# Patient Record
Sex: Female | Born: 1970 | Race: White | Hispanic: No | Marital: Married | Smoking: Current every day smoker
Health system: Southern US, Community
[De-identification: ages and names within clinical notes are randomized; demographics above are authoritative.]

## PROBLEM LIST (undated history)

## (undated) DIAGNOSIS — Z8719 Personal history of other diseases of the digestive system: Secondary | ICD-10-CM

## (undated) DIAGNOSIS — K219 Gastro-esophageal reflux disease without esophagitis: Secondary | ICD-10-CM

## (undated) DIAGNOSIS — D649 Anemia, unspecified: Secondary | ICD-10-CM

## (undated) DIAGNOSIS — F1110 Opioid abuse, uncomplicated: Secondary | ICD-10-CM

## (undated) DIAGNOSIS — I700 Atherosclerosis of aorta: Secondary | ICD-10-CM

## (undated) DIAGNOSIS — D696 Thrombocytopenia, unspecified: Secondary | ICD-10-CM

## (undated) DIAGNOSIS — N200 Calculus of kidney: Secondary | ICD-10-CM

## (undated) DIAGNOSIS — N179 Acute kidney failure, unspecified: Secondary | ICD-10-CM

## (undated) DIAGNOSIS — J449 Chronic obstructive pulmonary disease, unspecified: Secondary | ICD-10-CM

## (undated) DIAGNOSIS — K760 Fatty (change of) liver, not elsewhere classified: Secondary | ICD-10-CM

## (undated) HISTORY — PX: BACK SURGERY: SHX140

## (undated) HISTORY — DX: Thrombocytopenia, unspecified: D69.6

## (undated) HISTORY — DX: Atherosclerosis of aorta: I70.0

## (undated) HISTORY — DX: Chronic obstructive pulmonary disease, unspecified: J44.9

## (undated) HISTORY — DX: Calculus of kidney: N20.0

## (undated) HISTORY — DX: Personal history of other diseases of the digestive system: Z87.19

## (undated) HISTORY — DX: Acute kidney failure, unspecified: N17.9

## (undated) HISTORY — PX: OTHER SURGICAL HISTORY: SHX169

## (undated) HISTORY — DX: Fatty (change of) liver, not elsewhere classified: K76.0

## (undated) HISTORY — DX: Anemia, unspecified: D64.9

## (undated) HISTORY — DX: Gastro-esophageal reflux disease without esophagitis: K21.9

## (undated) HISTORY — DX: Opioid abuse, uncomplicated: F11.10

---

## 2004-08-07 ENCOUNTER — Emergency Department: Payer: Self-pay | Admitting: Emergency Medicine

## 2005-03-25 ENCOUNTER — Emergency Department: Payer: Self-pay | Admitting: Internal Medicine

## 2005-05-18 ENCOUNTER — Other Ambulatory Visit: Payer: Self-pay

## 2005-05-18 ENCOUNTER — Inpatient Hospital Stay: Payer: Self-pay | Admitting: Psychiatry

## 2005-07-02 ENCOUNTER — Encounter: Admission: RE | Admit: 2005-07-02 | Discharge: 2005-07-02 | Payer: Self-pay | Admitting: Neurosurgery

## 2005-07-16 ENCOUNTER — Encounter: Admission: RE | Admit: 2005-07-16 | Discharge: 2005-07-16 | Payer: Self-pay | Admitting: Neurosurgery

## 2005-08-13 ENCOUNTER — Inpatient Hospital Stay (HOSPITAL_COMMUNITY): Admission: RE | Admit: 2005-08-13 | Discharge: 2005-08-14 | Payer: Self-pay | Admitting: Neurosurgery

## 2006-04-11 ENCOUNTER — Emergency Department: Payer: Self-pay | Admitting: Emergency Medicine

## 2007-02-05 ENCOUNTER — Emergency Department: Payer: Self-pay | Admitting: Emergency Medicine

## 2007-02-08 ENCOUNTER — Ambulatory Visit: Payer: Self-pay | Admitting: Orthopaedic Surgery

## 2008-11-28 ENCOUNTER — Emergency Department: Payer: Self-pay | Admitting: Emergency Medicine

## 2009-09-07 ENCOUNTER — Emergency Department: Payer: Self-pay | Admitting: Emergency Medicine

## 2009-11-12 ENCOUNTER — Emergency Department: Payer: Self-pay | Admitting: Emergency Medicine

## 2010-02-17 ENCOUNTER — Encounter: Payer: Self-pay | Admitting: Neurosurgery

## 2011-10-07 ENCOUNTER — Emergency Department: Payer: Self-pay | Admitting: Emergency Medicine

## 2012-01-15 DIAGNOSIS — Z720 Tobacco use: Secondary | ICD-10-CM | POA: Diagnosis present

## 2012-01-15 DIAGNOSIS — J45909 Unspecified asthma, uncomplicated: Secondary | ICD-10-CM

## 2012-01-15 HISTORY — DX: Unspecified asthma, uncomplicated: J45.909

## 2012-03-02 ENCOUNTER — Emergency Department: Payer: Self-pay | Admitting: Emergency Medicine

## 2012-03-03 LAB — URINALYSIS, COMPLETE
Bilirubin,UR: NEGATIVE
Glucose,UR: NEGATIVE mg/dL (ref 0–75)
RBC,UR: <1 /HPF (ref 0–5)
Specific Gravity: 1.004 (ref 1.003–1.030)
WBC UR: NONE SEEN /HPF (ref 0–5)

## 2012-04-05 ENCOUNTER — Emergency Department: Payer: Self-pay | Admitting: Emergency Medicine

## 2013-01-26 DIAGNOSIS — M5412 Radiculopathy, cervical region: Secondary | ICD-10-CM | POA: Insufficient documentation

## 2013-02-16 ENCOUNTER — Inpatient Hospital Stay: Payer: Self-pay | Admitting: Surgery

## 2013-02-16 LAB — URINALYSIS, COMPLETE
BACTERIA: NONE SEEN
BILIRUBIN, UR: NEGATIVE
Glucose,UR: NEGATIVE mg/dL (ref 0–75)
LEUKOCYTE ESTERASE: NEGATIVE
Nitrite: NEGATIVE
PH: 5 (ref 4.5–8.0)
RBC,UR: 7 /HPF (ref 0–5)
Specific Gravity: 1.023 (ref 1.003–1.030)
Squamous Epithelial: 1

## 2013-02-16 LAB — COMPREHENSIVE METABOLIC PANEL
Albumin: 4 g/dL (ref 3.4–5.0)
Alkaline Phosphatase: 98 U/L
Anion Gap: 7 (ref 7–16)
BILIRUBIN TOTAL: 0.5 mg/dL (ref 0.2–1.0)
BUN: 10 mg/dL (ref 7–18)
Calcium, Total: 9.6 mg/dL (ref 8.5–10.1)
Chloride: 107 mmol/L (ref 98–107)
Co2: 23 mmol/L (ref 21–32)
Creatinine: 0.69 mg/dL (ref 0.60–1.30)
EGFR (Non-African Amer.): >60
Glucose: 115 mg/dL — ABNORMAL HIGH (ref 65–99)
Osmolality: 274 (ref 275–301)
Potassium: 3.9 mmol/L (ref 3.5–5.1)
SGOT(AST): 12 U/L — ABNORMAL LOW (ref 15–37)
SGPT (ALT): 24 U/L (ref 12–78)
Sodium: 137 mmol/L (ref 136–145)
TOTAL PROTEIN: 8.5 g/dL — AB (ref 6.4–8.2)

## 2013-02-16 LAB — CBC WITH DIFFERENTIAL/PLATELET
BASOS ABS: 0.2 10*3/uL — AB (ref 0.0–0.1)
Basophil %: 1.3 %
Eosinophil #: 0 10*3/uL (ref 0.0–0.7)
Eosinophil %: 0.1 %
HCT: 48.2 % — ABNORMAL HIGH (ref 35.0–47.0)
HGB: 16 g/dL (ref 12.0–16.0)
LYMPHS ABS: 1.6 10*3/uL (ref 1.0–3.6)
LYMPHS PCT: 8.8 %
MCH: 32.5 pg (ref 26.0–34.0)
MCHC: 33.2 g/dL (ref 32.0–36.0)
MCV: 98 fL (ref 80–100)
MONOS PCT: 4.1 %
Monocyte #: 0.7 x10 3/mm (ref 0.2–0.9)
NEUTROS ABS: 15.4 10*3/uL — AB (ref 1.4–6.5)
NEUTROS PCT: 85.7 %
PLATELETS: 314 10*3/uL (ref 150–440)
RBC: 4.92 10*6/uL (ref 3.80–5.20)
RDW: 13.5 % (ref 11.5–14.5)
WBC: 18 10*3/uL — AB (ref 3.6–11.0)

## 2013-02-16 LAB — LIPASE, BLOOD: LIPASE: 159 U/L (ref 73–393)

## 2013-02-17 LAB — CBC WITH DIFFERENTIAL/PLATELET
Basophil #: 0 10*3/uL (ref 0.0–0.1)
Basophil %: 0.3 %
EOS ABS: 0.2 10*3/uL (ref 0.0–0.7)
EOS PCT: 1.5 %
HCT: 41.1 % (ref 35.0–47.0)
HGB: 13.8 g/dL (ref 12.0–16.0)
LYMPHS ABS: 2 10*3/uL (ref 1.0–3.6)
Lymphocyte %: 17 %
MCH: 32.8 pg (ref 26.0–34.0)
MCHC: 33.6 g/dL (ref 32.0–36.0)
MCV: 98 fL (ref 80–100)
MONO ABS: 0.8 x10 3/mm (ref 0.2–0.9)
MONOS PCT: 6.7 %
NEUTROS ABS: 8.6 10*3/uL — AB (ref 1.4–6.5)
NEUTROS PCT: 74.5 %
Platelet: 269 10*3/uL (ref 150–440)
RBC: 4.21 10*6/uL (ref 3.80–5.20)
RDW: 13.5 % (ref 11.5–14.5)
WBC: 11.5 10*3/uL — AB (ref 3.6–11.0)

## 2013-02-17 LAB — BASIC METABOLIC PANEL
Anion Gap: 6 — ABNORMAL LOW (ref 7–16)
BUN: 10 mg/dL (ref 7–18)
CREATININE: 0.76 mg/dL (ref 0.60–1.30)
Calcium, Total: 8.6 mg/dL (ref 8.5–10.1)
Chloride: 105 mmol/L (ref 98–107)
Co2: 25 mmol/L (ref 21–32)
EGFR (African American): >60
EGFR (Non-African Amer.): >60
GLUCOSE: 130 mg/dL — AB (ref 65–99)
OSMOLALITY: 273 (ref 275–301)
Potassium: 3.4 mmol/L — ABNORMAL LOW (ref 3.5–5.1)
SODIUM: 136 mmol/L (ref 136–145)

## 2013-02-18 LAB — CBC WITH DIFFERENTIAL/PLATELET
BASOS PCT: 2.3 %
Basophil #: 0.1 10*3/uL (ref 0.0–0.1)
EOS ABS: 0.2 10*3/uL (ref 0.0–0.7)
EOS PCT: 3.7 %
HCT: 37.7 % (ref 35.0–47.0)
HGB: 12.4 g/dL (ref 12.0–16.0)
LYMPHS ABS: 2.2 10*3/uL (ref 1.0–3.6)
LYMPHS PCT: 34.6 %
MCH: 32.6 pg (ref 26.0–34.0)
MCHC: 32.8 g/dL (ref 32.0–36.0)
MCV: 99 fL (ref 80–100)
MONO ABS: 0.5 x10 3/mm (ref 0.2–0.9)
Monocyte %: 8.2 %
Neutrophil #: 3.2 10*3/uL (ref 1.4–6.5)
Neutrophil %: 51.2 %
Platelet: 223 10*3/uL (ref 150–440)
RBC: 3.8 10*6/uL (ref 3.80–5.20)
RDW: 13.5 % (ref 11.5–14.5)
WBC: 6.3 10*3/uL (ref 3.6–11.0)

## 2013-02-18 LAB — BASIC METABOLIC PANEL
ANION GAP: 2 — AB (ref 7–16)
BUN: 5 mg/dL — ABNORMAL LOW (ref 7–18)
CREATININE: 0.79 mg/dL (ref 0.60–1.30)
Calcium, Total: 8 mg/dL — ABNORMAL LOW (ref 8.5–10.1)
Chloride: 109 mmol/L — ABNORMAL HIGH (ref 98–107)
Co2: 27 mmol/L (ref 21–32)
GLUCOSE: 102 mg/dL — AB (ref 65–99)
OSMOLALITY: 273 (ref 275–301)
Potassium: 4.1 mmol/L (ref 3.5–5.1)
SODIUM: 138 mmol/L (ref 136–145)

## 2014-02-14 DIAGNOSIS — G8929 Other chronic pain: Secondary | ICD-10-CM | POA: Insufficient documentation

## 2014-05-20 NOTE — H&P (Signed)
PATIENT NAME:  Janet Hensley, LOWENTHAL MR#:  [id number] DATE OF BIRTH:  April 07, 1970  DATE OF ADMISSION:  02/16/2013  CHIEF COMPLAINT: Nausea, vomiting and diarrhea.   HISTORY OF PRESENT ILLNESS: This is a patient who started having some diffuse periumbilical abdominal pain yesterday and then this morning started having nausea, vomiting and diarrhea. She has vomited every 15 minutes. She has had multiple stools. Her abdominal pain is improved now that she has had the CAT scan and has had some bowel movements, but she still has abdominal pain and is still having nausea. A work-up in the Emergency Room suggested partial small bowel obstruction. I was asked to see the patient by Dr. Darnelle Catalan.   The patient has never had an episode like this before. Denies melena or hematochezia or back pain.   PAST MEDICAL HISTORY: Chronic back pain.   PAST SURGICAL HISTORY: Back surgery, C-section, and left wrist surgery.   ALLERGIES: None.   MEDICATIONS: Opiate analgesics and Lyrica.   FAMILY HISTORY: Noncontributory.   SOCIAL HISTORY: The patient smokes tobacco. Drinks alcohol. 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 She is accompanied by a significant other, who is sleeping at the time of the exam.   PHYSICAL EXAMINATION:   VITAL SIGNS: Stable. She is afebrile.  HEENT: No scleral icterus.  NECK: No palpable neck nodes.  CHEST: Clear to auscultation.  CARDIAC: Regular rate and rhythm.  ABDOMEN: Soft, nondistended, minimally tympanitic, and essentially nontender. No guarding. No rebound. No percussion tenderness.  EXTREMITIES: Without edema.  NEUROLOGIC: Grossly intact.  INTEGUMENTARY: No jaundice.   REVIEW OF SYSTEMS: Performed 10-system and negative with the exception of that mentioned in the HPI.   LABORATORY AND RADIOLOGICAL DATA: Demonstrate 1+ ketones 3+ blood in her urine.   CT scan is personally reviewed suggestive of partial small bowel obstruction.   Electrolytes are within normal limits.   White blood cell  count is 18,000, hemoglobin and hematocrit of 16 and 48 and a platelet count of 314.   ASSESSMENT AND PLAN: This is a patient with abdominal pain which is slightly improved, that started yesterday, but then she started having nausea, vomiting and diarrhea without melena, hematochezia or hematemesis. Her work-up has suggested bowel obstruction, partial in nature, but she does have a leukocytosis. I have recommended rehydrating her, admitting to the hospital, placing a nasogastric tube, and re-examination and serial x-rays. The possible pathophysiology for this was discussed, as was the plan, and she understood and agreed to proceed. Her significant other continued to sleep through the discussion.   ____________________________ Adah Salvage Excell Seltzer, MD rec:jcm D: 02/16/2013 20:25:17 ET T: 02/16/2013 21:04:47 ET JOB#: [id number]  cc: Adah Salvage. Excell Seltzer, MD, <Dictator> Lattie Haw MD ELECTRONICALLY SIGNED 02/16/2013 22:52

## 2014-05-20 NOTE — H&P (Signed)
   Subjective/Chief Complaint N/V/D   History of Present Illness 24 hrs abd pain followed by N/V/D no f/c no prior episode no melena   Past History PMH chronic back pain PSH csection, back, wrist   Past Med/Surgical Hx:  depression:   Left arm surgery:   Back surgery:   C-Section:   ALLERGIES:  No Known Allergies:   HOME MEDICATIONS: Medication Instructions Status  Lyrica 75 mg oral capsule 1 cap(s) orally 3 times a day Active  Percocet 10/325 325 mg-10 mg oral tablet 1 tab(s) orally 4 times a day Active  ProAir HFA CFC free 90 mcg/inh inhalation aerosol 2 puff(s) inhaled 4 times a day, As Needed - for Shortness of Breath Active   Family and Social History:  Family History Non-Contributory   Social History positive  tobacco, positive ETOH, 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   + Tobacco Current (within 1 year)   Place of Living Home   Review of Systems:  Fever/Chills No   Cough No   Abdominal Pain Yes   Diarrhea Yes   Constipation No   Nausea/Vomiting Yes   SOB/DOE No   Chest Pain No   Dysuria No   Tolerating Diet No  Nauseated  Vomiting   Medications/Allergies Reviewed Medications/Allergies reviewed   Physical Exam:  GEN no acute distress   HEENT pink conjunctivae   RESP normal resp effort  clear BS   CARD regular rate   ABD denies tenderness  soft   EXTR negative edema   SKIN normal to palpation   PSYCH alert, A+O to time, place, person, good insight    Assessment/Admission Diagnosis abd pain, improved followed by N/V/D rec admit rehysdrate serial exams/films, NG agrees with plan   Electronic Signatures: Lattie Haw (MD)  (Signed 21-Jan-15 20:20)  Authored: CHIEF COMPLAINT and HISTORY, PAST MEDICAL/SURGIAL HISTORY, ALLERGIES, HOME MEDICATIONS, FAMILY AND SOCIAL HISTORY, REVIEW OF SYSTEMS, PHYSICAL EXAM, ASSESSMENT AND PLAN   Last Updated: 21-Jan-15 20:20 by Lattie Haw (MD)

## 2014-07-10 DIAGNOSIS — M5010 Cervical disc disorder with radiculopathy, unspecified cervical region: Secondary | ICD-10-CM | POA: Insufficient documentation

## 2015-03-15 DIAGNOSIS — G4700 Insomnia, unspecified: Secondary | ICD-10-CM | POA: Insufficient documentation

## 2015-06-27 DIAGNOSIS — J4520 Mild intermittent asthma, uncomplicated: Secondary | ICD-10-CM | POA: Insufficient documentation

## 2015-09-19 DIAGNOSIS — F331 Major depressive disorder, recurrent, moderate: Secondary | ICD-10-CM | POA: Insufficient documentation

## 2015-09-19 DIAGNOSIS — F411 Generalized anxiety disorder: Secondary | ICD-10-CM | POA: Insufficient documentation

## 2016-01-30 ENCOUNTER — Ambulatory Visit
Admission: RE | Admit: 2016-01-30 | Discharge: 2016-01-30 | Disposition: A | Discharge status: Home / Self Care | Payer: Disability Insurance | Source: Ambulatory Visit | Attending: Thoracic Surgery | Admitting: Thoracic Surgery

## 2016-01-30 ENCOUNTER — Other Ambulatory Visit: Payer: Self-pay | Admitting: Thoracic Surgery

## 2016-01-30 DIAGNOSIS — M47812 Spondylosis without myelopathy or radiculopathy, cervical region: Secondary | ICD-10-CM | POA: Diagnosis not present

## 2016-01-30 DIAGNOSIS — M549 Dorsalgia, unspecified: Secondary | ICD-10-CM | POA: Diagnosis not present

## 2016-01-30 DIAGNOSIS — I2510 Atherosclerotic heart disease of native coronary artery without angina pectoris: Secondary | ICD-10-CM | POA: Diagnosis not present

## 2016-01-30 DIAGNOSIS — M5382 Other specified dorsopathies, cervical region: Secondary | ICD-10-CM | POA: Insufficient documentation

## 2017-10-08 ENCOUNTER — Other Ambulatory Visit: Payer: Self-pay | Admitting: Family Medicine

## 2017-10-08 DIAGNOSIS — Z1231 Encounter for screening mammogram for malignant neoplasm of breast: Secondary | ICD-10-CM

## 2017-12-21 DIAGNOSIS — G500 Trigeminal neuralgia: Secondary | ICD-10-CM | POA: Insufficient documentation

## 2018-01-27 HISTORY — PX: BRAIN SURGERY: SHX531

## 2018-08-25 DIAGNOSIS — Z9889 Other specified postprocedural states: Secondary | ICD-10-CM | POA: Insufficient documentation

## 2018-10-14 DIAGNOSIS — L209 Atopic dermatitis, unspecified: Secondary | ICD-10-CM | POA: Insufficient documentation

## 2020-04-16 ENCOUNTER — Other Ambulatory Visit: Payer: Self-pay | Admitting: Physician Assistant

## 2020-04-16 DIAGNOSIS — Z1231 Encounter for screening mammogram for malignant neoplasm of breast: Secondary | ICD-10-CM

## 2020-05-11 ENCOUNTER — Ambulatory Visit
Admission: RE | Admit: 2020-05-11 | Discharge: 2020-05-11 | Disposition: A | Discharge status: Home / Self Care | Payer: Managed Care, Other (non HMO) | Source: Ambulatory Visit | Attending: Physician Assistant | Admitting: Physician Assistant

## 2020-05-11 ENCOUNTER — Other Ambulatory Visit: Payer: Self-pay

## 2020-05-11 DIAGNOSIS — Z1231 Encounter for screening mammogram for malignant neoplasm of breast: Secondary | ICD-10-CM | POA: Diagnosis present

## 2020-07-10 ENCOUNTER — Other Ambulatory Visit: Payer: Self-pay | Admitting: Neurology

## 2020-07-10 DIAGNOSIS — M4802 Spinal stenosis, cervical region: Secondary | ICD-10-CM

## 2020-07-17 ENCOUNTER — Ambulatory Visit: Admission: RE | Admit: 2020-07-17 | Payer: Managed Care, Other (non HMO) | Source: Ambulatory Visit

## 2020-07-26 ENCOUNTER — Ambulatory Visit: Payer: Managed Care, Other (non HMO)

## 2021-06-14 DIAGNOSIS — F411 Generalized anxiety disorder: Secondary | ICD-10-CM | POA: Diagnosis not present

## 2021-06-14 DIAGNOSIS — M4802 Spinal stenosis, cervical region: Secondary | ICD-10-CM | POA: Diagnosis not present

## 2021-06-14 DIAGNOSIS — R69 Illness, unspecified: Secondary | ICD-10-CM | POA: Diagnosis not present

## 2021-06-14 DIAGNOSIS — Z1389 Encounter for screening for other disorder: Secondary | ICD-10-CM | POA: Diagnosis not present

## 2021-06-14 DIAGNOSIS — E669 Obesity, unspecified: Secondary | ICD-10-CM | POA: Diagnosis not present

## 2021-06-14 DIAGNOSIS — G894 Chronic pain syndrome: Secondary | ICD-10-CM | POA: Diagnosis not present

## 2021-08-30 DIAGNOSIS — L209 Atopic dermatitis, unspecified: Secondary | ICD-10-CM | POA: Diagnosis not present

## 2021-10-10 DIAGNOSIS — G894 Chronic pain syndrome: Secondary | ICD-10-CM | POA: Diagnosis not present

## 2021-10-10 DIAGNOSIS — L209 Atopic dermatitis, unspecified: Secondary | ICD-10-CM | POA: Diagnosis not present

## 2021-10-10 DIAGNOSIS — Z20822 Contact with and (suspected) exposure to covid-19: Secondary | ICD-10-CM | POA: Diagnosis not present

## 2021-10-10 DIAGNOSIS — J069 Acute upper respiratory infection, unspecified: Secondary | ICD-10-CM | POA: Diagnosis not present

## 2021-10-16 DIAGNOSIS — M5481 Occipital neuralgia: Secondary | ICD-10-CM | POA: Diagnosis not present

## 2021-10-16 DIAGNOSIS — R42 Dizziness and giddiness: Secondary | ICD-10-CM | POA: Diagnosis not present

## 2021-10-16 DIAGNOSIS — G8929 Other chronic pain: Secondary | ICD-10-CM | POA: Diagnosis not present

## 2021-10-16 DIAGNOSIS — M542 Cervicalgia: Secondary | ICD-10-CM | POA: Diagnosis not present

## 2021-10-16 DIAGNOSIS — R519 Headache, unspecified: Secondary | ICD-10-CM | POA: Diagnosis not present

## 2021-10-16 DIAGNOSIS — M4802 Spinal stenosis, cervical region: Secondary | ICD-10-CM | POA: Diagnosis not present

## 2021-11-14 DIAGNOSIS — G4700 Insomnia, unspecified: Secondary | ICD-10-CM | POA: Diagnosis not present

## 2021-11-14 DIAGNOSIS — G894 Chronic pain syndrome: Secondary | ICD-10-CM | POA: Diagnosis not present

## 2021-12-22 ENCOUNTER — Inpatient Hospital Stay
Admission: EM | Admit: 2021-12-22 | Discharge: 2021-12-28 | DRG: 871 | Disposition: A | Discharge status: Other Acute Inpatient Hospital | Payer: 59 | Attending: Pulmonary Disease | Admitting: Pulmonary Disease

## 2021-12-22 ENCOUNTER — Encounter: Payer: Self-pay | Admitting: Internal Medicine

## 2021-12-22 ENCOUNTER — Emergency Department: Payer: 59

## 2021-12-22 ENCOUNTER — Other Ambulatory Visit: Payer: Self-pay

## 2021-12-22 DIAGNOSIS — J151 Pneumonia due to Pseudomonas: Secondary | ICD-10-CM | POA: Diagnosis not present

## 2021-12-22 DIAGNOSIS — R000 Tachycardia, unspecified: Secondary | ICD-10-CM | POA: Diagnosis not present

## 2021-12-22 DIAGNOSIS — Z4682 Encounter for fitting and adjustment of non-vascular catheter: Secondary | ICD-10-CM | POA: Diagnosis not present

## 2021-12-22 DIAGNOSIS — M549 Dorsalgia, unspecified: Secondary | ICD-10-CM | POA: Diagnosis present

## 2021-12-22 DIAGNOSIS — R9431 Abnormal electrocardiogram [ECG] [EKG]: Secondary | ICD-10-CM | POA: Diagnosis not present

## 2021-12-22 DIAGNOSIS — J441 Chronic obstructive pulmonary disease with (acute) exacerbation: Secondary | ICD-10-CM | POA: Diagnosis present

## 2021-12-22 DIAGNOSIS — T380X5A Adverse effect of glucocorticoids and synthetic analogues, initial encounter: Secondary | ICD-10-CM | POA: Diagnosis not present

## 2021-12-22 DIAGNOSIS — R579 Shock, unspecified: Secondary | ICD-10-CM | POA: Diagnosis not present

## 2021-12-22 DIAGNOSIS — E871 Hypo-osmolality and hyponatremia: Secondary | ICD-10-CM | POA: Diagnosis present

## 2021-12-22 DIAGNOSIS — G500 Trigeminal neuralgia: Secondary | ICD-10-CM | POA: Diagnosis present

## 2021-12-22 DIAGNOSIS — M542 Cervicalgia: Secondary | ICD-10-CM | POA: Diagnosis not present

## 2021-12-22 DIAGNOSIS — J9690 Respiratory failure, unspecified, unspecified whether with hypoxia or hypercapnia: Secondary | ICD-10-CM | POA: Diagnosis not present

## 2021-12-22 DIAGNOSIS — I2510 Atherosclerotic heart disease of native coronary artery without angina pectoris: Secondary | ICD-10-CM

## 2021-12-22 DIAGNOSIS — N179 Acute kidney failure, unspecified: Secondary | ICD-10-CM

## 2021-12-22 DIAGNOSIS — D649 Anemia, unspecified: Secondary | ICD-10-CM | POA: Diagnosis not present

## 2021-12-22 DIAGNOSIS — E878 Other disorders of electrolyte and fluid balance, not elsewhere classified: Secondary | ICD-10-CM | POA: Diagnosis present

## 2021-12-22 DIAGNOSIS — E8809 Other disorders of plasma-protein metabolism, not elsewhere classified: Secondary | ICD-10-CM | POA: Diagnosis not present

## 2021-12-22 DIAGNOSIS — E870 Hyperosmolality and hypernatremia: Secondary | ICD-10-CM | POA: Diagnosis not present

## 2021-12-22 DIAGNOSIS — Z720 Tobacco use: Secondary | ICD-10-CM | POA: Diagnosis present

## 2021-12-22 DIAGNOSIS — R531 Weakness: Secondary | ICD-10-CM | POA: Diagnosis not present

## 2021-12-22 DIAGNOSIS — E874 Mixed disorder of acid-base balance: Secondary | ICD-10-CM | POA: Diagnosis present

## 2021-12-22 DIAGNOSIS — R590 Localized enlarged lymph nodes: Secondary | ICD-10-CM | POA: Diagnosis present

## 2021-12-22 DIAGNOSIS — R4182 Altered mental status, unspecified: Secondary | ICD-10-CM | POA: Diagnosis not present

## 2021-12-22 DIAGNOSIS — R6520 Severe sepsis without septic shock: Secondary | ICD-10-CM | POA: Diagnosis not present

## 2021-12-22 DIAGNOSIS — J440 Chronic obstructive pulmonary disease with acute lower respiratory infection: Secondary | ICD-10-CM | POA: Diagnosis not present

## 2021-12-22 DIAGNOSIS — I25111 Atherosclerotic heart disease of native coronary artery with angina pectoris with documented spasm: Secondary | ICD-10-CM | POA: Diagnosis not present

## 2021-12-22 DIAGNOSIS — J9602 Acute respiratory failure with hypercapnia: Secondary | ICD-10-CM | POA: Diagnosis present

## 2021-12-22 DIAGNOSIS — E43 Unspecified severe protein-calorie malnutrition: Secondary | ICD-10-CM | POA: Diagnosis present

## 2021-12-22 DIAGNOSIS — R599 Enlarged lymph nodes, unspecified: Secondary | ICD-10-CM

## 2021-12-22 DIAGNOSIS — F419 Anxiety disorder, unspecified: Secondary | ICD-10-CM | POA: Diagnosis present

## 2021-12-22 DIAGNOSIS — G8929 Other chronic pain: Secondary | ICD-10-CM | POA: Diagnosis present

## 2021-12-22 DIAGNOSIS — R0603 Acute respiratory distress: Principal | ICD-10-CM | POA: Diagnosis present

## 2021-12-22 DIAGNOSIS — A4152 Sepsis due to Pseudomonas: Principal | ICD-10-CM | POA: Diagnosis present

## 2021-12-22 DIAGNOSIS — Z79899 Other long term (current) drug therapy: Secondary | ICD-10-CM

## 2021-12-22 DIAGNOSIS — Z1152 Encounter for screening for COVID-19: Secondary | ICD-10-CM | POA: Diagnosis not present

## 2021-12-22 DIAGNOSIS — D6959 Other secondary thrombocytopenia: Secondary | ICD-10-CM | POA: Diagnosis present

## 2021-12-22 DIAGNOSIS — R0600 Dyspnea, unspecified: Secondary | ICD-10-CM | POA: Diagnosis not present

## 2021-12-22 DIAGNOSIS — J80 Acute respiratory distress syndrome: Secondary | ICD-10-CM | POA: Diagnosis not present

## 2021-12-22 DIAGNOSIS — F17210 Nicotine dependence, cigarettes, uncomplicated: Secondary | ICD-10-CM | POA: Diagnosis present

## 2021-12-22 DIAGNOSIS — F32A Depression, unspecified: Secondary | ICD-10-CM | POA: Diagnosis not present

## 2021-12-22 DIAGNOSIS — E875 Hyperkalemia: Secondary | ICD-10-CM | POA: Diagnosis not present

## 2021-12-22 DIAGNOSIS — A4159 Other Gram-negative sepsis: Secondary | ICD-10-CM | POA: Diagnosis not present

## 2021-12-22 DIAGNOSIS — I213 ST elevation (STEMI) myocardial infarction of unspecified site: Secondary | ICD-10-CM

## 2021-12-22 DIAGNOSIS — E876 Hypokalemia: Secondary | ICD-10-CM | POA: Diagnosis present

## 2021-12-22 DIAGNOSIS — R0902 Hypoxemia: Secondary | ICD-10-CM | POA: Diagnosis not present

## 2021-12-22 DIAGNOSIS — R601 Generalized edema: Secondary | ICD-10-CM | POA: Diagnosis present

## 2021-12-22 DIAGNOSIS — R0602 Shortness of breath: Secondary | ICD-10-CM | POA: Diagnosis not present

## 2021-12-22 DIAGNOSIS — Z6834 Body mass index (BMI) 34.0-34.9, adult: Secondary | ICD-10-CM

## 2021-12-22 DIAGNOSIS — Z9911 Dependence on respirator [ventilator] status: Secondary | ICD-10-CM | POA: Diagnosis not present

## 2021-12-22 DIAGNOSIS — J9601 Acute respiratory failure with hypoxia: Secondary | ICD-10-CM | POA: Diagnosis present

## 2021-12-22 DIAGNOSIS — I428 Other cardiomyopathies: Secondary | ICD-10-CM | POA: Diagnosis not present

## 2021-12-22 DIAGNOSIS — A481 Legionnaires' disease: Secondary | ICD-10-CM | POA: Diagnosis not present

## 2021-12-22 DIAGNOSIS — J189 Pneumonia, unspecified organism: Secondary | ICD-10-CM | POA: Diagnosis not present

## 2021-12-22 DIAGNOSIS — R918 Other nonspecific abnormal finding of lung field: Secondary | ICD-10-CM | POA: Diagnosis not present

## 2021-12-22 DIAGNOSIS — R739 Hyperglycemia, unspecified: Secondary | ICD-10-CM | POA: Diagnosis not present

## 2021-12-22 LAB — COMPREHENSIVE METABOLIC PANEL
ALT: 40 U/L (ref 0–44)
AST: 168 U/L — ABNORMAL HIGH (ref 15–41)
Albumin: <1.5 g/dL — ABNORMAL LOW (ref 3.5–5.0)
Alkaline Phosphatase: 56 U/L (ref 38–126)
Anion gap: 14 (ref 5–15)
BUN: 33 mg/dL — ABNORMAL HIGH (ref 6–20)
CO2: 22 mmol/L (ref 22–32)
Calcium: 7.3 mg/dL — ABNORMAL LOW (ref 8.9–10.3)
Chloride: 98 mmol/L (ref 98–111)
Creatinine, Ser: 0.83 mg/dL (ref 0.44–1.00)
GFR, Estimated: >60 mL/min (ref >60)
Glucose, Bld: 110 mg/dL — ABNORMAL HIGH (ref 70–99)
Potassium: 2.7 mmol/L — CL (ref 3.5–5.1)
Sodium: 134 mmol/L — ABNORMAL LOW (ref 135–145)
Total Bilirubin: 1.3 mg/dL — ABNORMAL HIGH (ref 0.3–1.2)
Total Protein: 5.3 g/dL — ABNORMAL LOW (ref 6.5–8.1)

## 2021-12-22 LAB — BLOOD GAS, VENOUS
Acid-base deficit: 1.5 mmol/L (ref 0.0–2.0)
Bicarbonate: 24.3 mmol/L (ref 20.0–28.0)
O2 Saturation: 71.9 %
Patient temperature: 37
pCO2, Ven: 44 mmHg (ref 44–60)
pH, Ven: 7.35 (ref 7.25–7.43)
pO2, Ven: 45 mmHg (ref 32–45)

## 2021-12-22 LAB — TROPONIN I (HIGH SENSITIVITY)
Troponin I (High Sensitivity): 5 ng/L (ref <18)
Troponin I (High Sensitivity): 5 ng/L (ref <18)

## 2021-12-22 LAB — CBC WITH DIFFERENTIAL/PLATELET
Abs Immature Granulocytes: 0.07 10*3/uL (ref 0.00–0.07)
Basophils Absolute: 0 10*3/uL (ref 0.0–0.1)
Basophils Relative: 1 %
Eosinophils Absolute: 0 10*3/uL (ref 0.0–0.5)
Eosinophils Relative: 0 %
HCT: 32.7 % — ABNORMAL LOW (ref 36.0–46.0)
Hemoglobin: 11.3 g/dL — ABNORMAL LOW (ref 12.0–15.0)
Immature Granulocytes: 1 %
Lymphocytes Relative: 9 %
Lymphs Abs: 0.6 10*3/uL — ABNORMAL LOW (ref 0.7–4.0)
MCH: 31.7 pg (ref 26.0–34.0)
MCHC: 34.6 g/dL (ref 30.0–36.0)
MCV: 91.6 fL (ref 80.0–100.0)
Monocytes Absolute: 0.1 10*3/uL (ref 0.1–1.0)
Monocytes Relative: 2 %
Neutro Abs: 5.2 10*3/uL (ref 1.7–7.7)
Neutrophils Relative %: 87 %
Platelets: 217 10*3/uL (ref 150–400)
RBC: 3.57 MIL/uL — ABNORMAL LOW (ref 3.87–5.11)
RDW: 13.8 % (ref 11.5–15.5)
Smear Review: NORMAL
WBC: 6 10*3/uL (ref 4.0–10.5)
nRBC: 0 % (ref 0.0–0.2)

## 2021-12-22 LAB — PROCALCITONIN: Procalcitonin: 15.18 ng/mL

## 2021-12-22 LAB — GLUCOSE, CAPILLARY: Glucose-Capillary: 93 mg/dL (ref 70–99)

## 2021-12-22 LAB — BRAIN NATRIURETIC PEPTIDE: B Natriuretic Peptide: 15.2 pg/mL (ref 0.0–100.0)

## 2021-12-22 LAB — LACTIC ACID, PLASMA
Lactic Acid, Venous: 5.1 mmol/L (ref 0.5–1.9)
Lactic Acid, Venous: 5.6 mmol/L (ref 0.5–1.9)

## 2021-12-22 LAB — TSH: TSH: 0.744 u[IU]/mL (ref 0.350–4.500)

## 2021-12-22 LAB — CK: Total CK: 26 U/L — ABNORMAL LOW (ref 38–234)

## 2021-12-22 LAB — ETHANOL: Alcohol, Ethyl (B): <10 mg/dL (ref <10)

## 2021-12-22 LAB — MAGNESIUM: Magnesium: 2.4 mg/dL (ref 1.7–2.4)

## 2021-12-22 MED ORDER — METHYLPREDNISOLONE SODIUM SUCC 40 MG IJ SOLR: 40.0000 mg | Freq: Once | INTRAMUSCULAR | Status: DC

## 2021-12-22 MED ORDER — ACETAMINOPHEN 325 MG PO TABS: 650.0000 mg | ORAL_TABLET | Freq: Four times a day (QID) | ORAL | Status: DC | PRN

## 2021-12-22 MED ORDER — HEPARIN SODIUM (PORCINE) 5000 UNIT/ML IJ SOLN
5000.0000 [IU] | Freq: Three times a day (TID) | INTRAMUSCULAR | Status: DC
  Administered 2021-12-22 – 2021-12-23 (×2): 5000 [IU] via SUBCUTANEOUS
  Filled 2021-12-22 (×2): qty 1

## 2021-12-22 MED ORDER — SODIUM CHLORIDE 0.9 % IV SOLN
2.0000 g | Freq: Two times a day (BID) | INTRAVENOUS | Status: DC
  Administered 2021-12-23: 2 g via INTRAVENOUS
  Filled 2021-12-22: qty 2
  Filled 2021-12-22: qty 20

## 2021-12-22 MED ORDER — NICOTINE 21 MG/24HR TD PT24
21.0000 mg | MEDICATED_PATCH | Freq: Every day | TRANSDERMAL | Status: DC
  Administered 2021-12-24 – 2021-12-26 (×3): 21 mg via TRANSDERMAL
  Filled 2021-12-22 (×6): qty 1

## 2021-12-22 MED ORDER — HYDROCODONE-ACETAMINOPHEN 5-325 MG PO TABS: 1.0000 | ORAL_TABLET | ORAL | Status: DC | PRN

## 2021-12-22 MED ORDER — BUDESONIDE 0.25 MG/2ML IN SUSP
0.2500 mg | Freq: Two times a day (BID) | RESPIRATORY_TRACT | Status: DC
  Administered 2021-12-23 – 2021-12-28 (×11): 0.25 mg via RESPIRATORY_TRACT
  Filled 2021-12-22 (×11): qty 2

## 2021-12-22 MED ORDER — LACTATED RINGERS IV BOLUS (SEPSIS)
1000.0000 mL | Freq: Once | INTRAVENOUS | Status: AC
  Administered 2021-12-22: 1000 mL via INTRAVENOUS

## 2021-12-22 MED ORDER — LACTATED RINGERS IV SOLN: INTRAVENOUS | Status: AC

## 2021-12-22 MED ORDER — SODIUM CHLORIDE 0.9 % IV SOLN
2.0000 g | INTRAVENOUS | Status: DC
  Administered 2021-12-22: 2 g via INTRAVENOUS
  Filled 2021-12-22: qty 20

## 2021-12-22 MED ORDER — IPRATROPIUM-ALBUTEROL 0.5-2.5 (3) MG/3ML IN SOLN
3.0000 mL | RESPIRATORY_TRACT | Status: DC | PRN
  Administered 2021-12-22: 3 mL via RESPIRATORY_TRACT
  Filled 2021-12-22: qty 3

## 2021-12-22 MED ORDER — METHYLPREDNISOLONE SODIUM SUCC 125 MG IJ SOLR
125.0000 mg | Freq: Once | INTRAMUSCULAR | Status: AC
  Administered 2021-12-22: 125 mg via INTRAVENOUS
  Filled 2021-12-22: qty 2

## 2021-12-22 MED ORDER — LACTATED RINGERS IV SOLN: INTRAVENOUS | Status: DC

## 2021-12-22 MED ORDER — SODIUM CHLORIDE 0.9 % IV BOLUS
1000.0000 mL | Freq: Once | INTRAVENOUS | Status: AC
  Administered 2021-12-22: 1000 mL via INTRAVENOUS

## 2021-12-22 MED ORDER — SODIUM CHLORIDE 0.9% FLUSH
3.0000 mL | Freq: Two times a day (BID) | INTRAVENOUS | Status: DC
  Administered 2021-12-23 – 2021-12-27 (×9): 3 mL via INTRAVENOUS

## 2021-12-22 MED ORDER — DOXYCYCLINE HYCLATE 100 MG PO TABS
100.0000 mg | ORAL_TABLET | Freq: Once | ORAL | Status: AC
  Administered 2021-12-22: 100 mg via ORAL
  Filled 2021-12-22: qty 1

## 2021-12-22 MED ORDER — LACTATED RINGERS IV BOLUS: 500.0000 mL | Freq: Once | INTRAVENOUS | Status: DC

## 2021-12-22 MED ORDER — POTASSIUM CHLORIDE 10 MEQ/100ML IV SOLN
10.0000 meq | Freq: Once | INTRAVENOUS | Status: AC
  Administered 2021-12-22: 10 meq via INTRAVENOUS

## 2021-12-22 MED ORDER — IOHEXOL 350 MG/ML SOLN
80.0000 mL | Freq: Once | INTRAVENOUS | Status: AC | PRN
  Administered 2021-12-22: 80 mL via INTRAVENOUS

## 2021-12-22 MED ORDER — SODIUM CHLORIDE 0.9 % IV SOLN
100.0000 mg | Freq: Two times a day (BID) | INTRAVENOUS | Status: DC
  Filled 2021-12-22: qty 100

## 2021-12-22 MED ORDER — VANCOMYCIN HCL 2000 MG/400ML IV SOLN
2000.0000 mg | Freq: Once | INTRAVENOUS | Status: AC
  Administered 2021-12-23: 2000 mg via INTRAVENOUS
  Filled 2021-12-22: qty 400

## 2021-12-22 MED ORDER — SODIUM CHLORIDE 0.9 % IV SOLN: 1.0000 g | Freq: Two times a day (BID) | INTRAVENOUS | Status: DC

## 2021-12-22 MED ORDER — IPRATROPIUM-ALBUTEROL 0.5-2.5 (3) MG/3ML IN SOLN
3.0000 mL | RESPIRATORY_TRACT | Status: DC
  Administered 2021-12-23 – 2021-12-28 (×34): 3 mL via RESPIRATORY_TRACT
  Filled 2021-12-22 (×34): qty 3

## 2021-12-22 MED ORDER — POTASSIUM CHLORIDE CRYS ER 20 MEQ PO TBCR
40.0000 meq | EXTENDED_RELEASE_TABLET | Freq: Once | ORAL | Status: AC
  Administered 2021-12-22: 40 meq via ORAL
  Filled 2021-12-22: qty 2

## 2021-12-22 MED ORDER — ACETAMINOPHEN 650 MG RE SUPP: 650.0000 mg | Freq: Four times a day (QID) | RECTAL | Status: DC | PRN

## 2021-12-22 MED ORDER — MORPHINE SULFATE (PF) 4 MG/ML IV SOLN
4.0000 mg | Freq: Once | INTRAVENOUS | Status: AC
  Administered 2021-12-22: 4 mg via INTRAVENOUS
  Filled 2021-12-22: qty 1

## 2021-12-22 MED ORDER — METHYLPREDNISOLONE SODIUM SUCC 125 MG IJ SOLR: 125.0000 mg | Freq: Two times a day (BID) | INTRAMUSCULAR | Status: DC

## 2021-12-22 MED ORDER — MORPHINE SULFATE (PF) 2 MG/ML IV SOLN: 2.0000 mg | INTRAVENOUS | Status: DC | PRN

## 2021-12-22 NOTE — Sepsis Progress Note (Signed)
Elink following code sepsis °

## 2021-12-22 NOTE — Consult Note (Addendum)
NAME:  Janet Hensley, MRN:  [id number], DOB:  1970/07/20, LOS: 0 ADMISSION DATE:  12/22/2021, CONSULTATION DATE:  12/22/2021 REFERRING MD: Florina Ou  CHIEF COMPLAINT:  SOB   HPI  51 y.o with significant PMH of Asthma, tobacco abuse, chronic neck and back pain, headache, anxiety and depression who presented to the ED with chief complaints of progressive shortness of breath.  Patient presented to outside ED on 11/19  with c/o cough, congestion,fever, nausea and vomiting that started 5 days ago. RAPID INFLUENZA/RSV/COVID PCR was negative. She was diagnosed with viral illness and sent home with Zofran and Tessalon. Patient report worsening symptoms over the course of several days with progressive SOB and cough therefore she called EMS. On EMS arrival, was found to be hypoxic at 89% on room air with significant increased work of breathing and wheezing on exam.  Patient received 3 DuoNeb treatments, IV Solu-Medrol and IV magnesium 2 g prior to arrival to the ED.   ED Course: Initial vital signs showed HR of 122 beats/minute, BP 115/85m Hg, the RR 35 breaths/minute, and the oxygen saturation 94 % on 2L [place] and a temperature of 98.23F (36.7C).  Pertinent Labs/Diagnostics Findings: Chemistry:Na+/ K+: 134/2.7  Glucose 110 : BUN/Cr.:33/0.83 Calcium: 7.3  AST/ALT:168/40 CBC: WBC: 6.0 Other Lab findings:   PCT:15.18  Lactic acid: 5.1 COVID PCR: Negative Venous Blood Gas result:  pO2 <31; pCO2 43; pH 7.37;  HCO3 24.9, %O2 Sat 31.7.  Imaging:  CXR>Opacification of the LEFT hemithorax which may represent consolidation, atelectasis and/or pleural effusion. CTH> CTA Chest> Near-complete opacification of the left lung with low-attenuation fluid/infiltrate. This is likely in part due to pneumonia possibly postobstructive in nature given mucus and secretions in multiple segmental bronchi throughout the left lung.  Patient given 30 cc/kg of fluids and started on broad-spectrum antibiotics Ceftriaxone and Doxy for  sepsis secondary to pneumonia. Patient admitted to hospitalist service. Due to progressive short of breath and high risk for decompensation and intubation, PCCM was consulted.  Past Medical History  Asthma, tobacco abuse, chronic neck and back pain, headache, anxiety and depression   Significant Hospital Events Notes  11/26: Admit to ICU with Acute hypoxic respiratory failure secondary to pneumonia  Consults:  PCCM  Procedures:  None  Significant Diagnostic Tests:  11/26: Chest Xray>Opacification of the LEFT hemithorax which may represent consolidation, atelectasis and/or pleural effusion. 11/26: CTA Chest>1. Near-complete opacification of the left lung with low-attenuation fluid/infiltrate. This is likely in part due to pneumonia possibly postobstructive in nature given mucus and secretions in multiple segmental bronchi throughout the left lung. However given the mediastinal adenopathy and nodularity in the right lung underlying malignancy is difficult to exclude. Short interval follow-up after treatment is recommended. 2. Multiple indeterminate pulmonary nodules in the right lung may be infectious/inflammatory however metastases are not excluded. 3. Mediastinal and right hilar adenopathy.  Micro Data:  11/26: SARS-CoV-2 PCR> negative 11/26: Influenza PCR> negative 11/26: Blood culture x2> 11/26: Urine Culture> 11/26: MRSA PCR>>  11/26: Strep pneumo urinary antigen> 11/26: Legionella urinary antigen> 11/26: Mycoplasma pneumonia>  Antimicrobials:  Vancomycin 11/27> Ceftriaxone 11/27>  OBJECTIVE  Blood pressure 124/77, pulse (!) 118, temperature 98.1 F (36.7 C), temperature source Axillary, resp. rate (!) 43, height _0  (1.6 m), weight 86.6 kg, last menstrual period 01/16/2016, SpO2 94 %.       No intake or output data in the 24 hours ending 12/22/21 2249 Filed Weights   12/22/21 1820  Weight: 86.6 kg   Physical  Examination  GENERAL: year-old critically ill  patient lying in the bed on BiPAP EYES: Pupils equal, round, reactive to light and accommodation. No scleral icterus. Extraocular muscles intact.  HEENT: Head atraumatic, normocephalic. Oropharynx and nasopharynx clear.  NECK:  Supple, no jugular venous distention. No thyroid enlargement, no tenderness.  LUNGS: Normal breath sounds bilaterally, no wheezing, rales,rhonchi or crepitation. Mild use of accessory muscles of respiration.  CARDIOVASCULAR: S1, S2 normal. No murmurs, rubs, or gallops.  ABDOMEN: Soft, nontender, nondistended. Bowel sounds present. No organomegaly or mass.  EXTREMITIES: Upper and lower extremities are atraumatic in appearance without tenderness or deformity. No swelling or erythema.  Muscle strength is 5/5 bilaterally. Capillary refill > 3 seconds in all extremities. Pulses palpable.  NEUROLOGIC:The patient is awake, alert and oriented to person, place, and time with normal speech. Motor function is normal with muscle strength 5/5 bilaterally to upper and lower extremities. Sensation is intact bilaterally. Reflexes 2+ bilaterally. Cranial nerves are intact.  Gait not checked.  PSYCHIATRIC: The patient is on BiPAP SKIN: No obvious rash, lesion, or ulcer.   Labs/imaging that I havepersonally reviewed  (right click and "Reselect all SmartList Selections" daily)     Labs   CBC: Recent Labs  Lab 12/22/21 1836  WBC 6.0  NEUTROABS 5.2  HGB 11.3*  HCT 32.7*  MCV 91.6  PLT 456    Basic Metabolic Panel: Recent Labs  Lab 12/22/21 1836  NA 134*  K 2.7*  CL 98  CO2 22  GLUCOSE 110*  BUN 33*  CREATININE 0.83  CALCIUM 7.3*   GFR: Estimated Creatinine Clearance: 83.7 mL/min (by C-G formula based on SCr of 0.83 mg/dL). Recent Labs  Lab 12/22/21 1836  WBC 6.0  LATICACIDVEN 5.1*    Liver Function Tests: Recent Labs  Lab 12/22/21 1836  AST 168*  ALT 40  ALKPHOS 56  BILITOT 1.3*  PROT 5.3*  ALBUMIN <1.5*   No results for input(s): "LIPASE", "AMYLASE" in  the last 168 hours. No results for input(s): "AMMONIA" in the last 168 hours.  ABG    Component Value Date/Time   HCO3 24.9 12/22/2021 1836   ACIDBASEDEF 0.6 12/22/2021 1836   O2SAT 31.7 12/22/2021 1836     Coagulation Profile: No results for input(s): "INR", "PROTIME" in the last 168 hours.  Cardiac Enzymes: Recent Labs  Lab 12/22/21 1836  CKTOTAL 26*    HbA1C: No results found for: "HGBA1C"  CBG: No results for input(s): "GLUCAP" in the last 168 hours.  Review of Systems:   Unable to obtain patient on BIPAP  Past Medical History  She,  has a past medical history of Asthma (01/15/2012).   Surgical History   History reviewed. No pertinent surgical history.   Social History      Family History   Her family history is not on file.   Allergies No Known Allergies   Home Medications  Prior to Admission medications   Medication Sig Start Date End Date Taking? Authorizing Provider  benzonatate (TESSALON) 100 MG capsule Take 100 mg by mouth every 8 (eight) hours. Take 1 tablet every eight hours for seven days. 12/15/21 12/22/21 Yes [provider]  cyclobenzaprine (FLEXERIL) 10 MG tablet Take 10 mg by mouth as needed (for neck pain). 11/23/17  Yes [provider]  LORazepam (ATIVAN) 0.5 MG tablet Take 0.5-1 mg by mouth at bedtime as needed for sleep. 12/02/21  Yes [provider]  NARCAN 4 MG/0.1ML LIQD nasal spray kit Place 1 spray into the  nose once. 11/14/21  Yes [provider]  oxyCODONE-acetaminophen (PERCOCET) 10-325 MG tablet Take 1 tablet by mouth 4 (four) times daily as needed for pain.   Yes [provider]    Scheduled Meds:  budesonide (PULMICORT) nebulizer solution  0.25 mg Nebulization BID   heparin  5,000 Units Subcutaneous Q8H   ipratropium-albuterol  3 mL Nebulization Q4H   methylPREDNISolone (SOLU-MEDROL) injection  40 mg Intravenous Once   nicotine  21 mg Transdermal Daily   sodium chloride flush  3 mL  Intravenous Q12H   Continuous Infusions:  cefTRIAXone (ROCEPHIN)  IV     lactated ringers     potassium chloride 10 mEq (12/22/21 2355)   [START ON 12/24/2021] vancomycin     vancomycin     PRN Meds:.acetaminophen **OR** acetaminophen, HYDROcodone-acetaminophen, ipratropium-albuterol, morphine injection  Active Hospital Problem list     Assessment & Plan:  Acute Hypoxic Respiratory Failure secondary to Postobstructive CAP, Mucus Plugging,? Lung Metastases PMHx: Asthma, Tobacco abuse -Supplemental O2 to maintain SpO2 > 90% -BIPAP, wean FiO2 as tolerated -High risk for Intubation -Intermittent chest x-ray & ABG PRN -Ensure adequate pulmonary hygiene  -Methylprednisolone IV 40, 125 mg X 1 -Smoking cessation education once stabilized -May need repeat imaging and possible bronch eval for lung neoplasm -Continue CAP Pna coverage -Budesonide inhaler/nebs BID, bronchodilators PRN   Severe Sepsis Due to CAP Lactic: 5.1, Baseline PCT: 15.18, Initial interventions/workup included: 3 L of NS/LR & Ceftriaxone/ Doxy meets SIRS criteria: Heart Rate 122 beats/minute, Respiratory Rate 35 breaths/minute -Supplemental oxygen as needed, to maintain SpO2 > 90% -F/u cultures, trend lactic/ PCT -Check strep pneumo & Legionella antigen -Monitor WBC/ fever curve -IV antibiotics: vancomycin & ceftriaxone &Doxy -IVF hydration as needed -Pressors for MAP goal >65 -Strict I/O's  Hypokalemia Hyponatremia -Monitor I&O's / urinary output -Follow BMP -Replace electrolytes as indicated    Best practice:  Diet:  NPO Pain/Anxiety/Delirium protocol (if indicated): No VAP protocol (if indicated): Not indicated DVT prophylaxis: LMWH GI prophylaxis: N/A Glucose control:  SSI No Central venous access:  N/A Arterial line:  N/A Foley:  N/A Mobility:  bed rest  PT consulted: N/A Last date of multidisciplinary goals of care discussion [12/22/21] Code Status:  full code Disposition: ICU   = Goals  of Care = Code Status Order: FULL  Primary Emergency Contact: [place]ne[place],Chris Wishes to pursue full aggressive treatment and intervention options, including CPR and intubation, but goals of care will be addressed on going with family if that should become necessary.  Critical care time: 45 minutes       Rufina Falco, DNP, CCRN, FNP-C, AGACNP-BC Acute Care Nurse Practitioner [facility]  PCCM on call pager [phone] until 7 am

## 2021-12-22 NOTE — Progress Notes (Addendum)
eLink Physician-Brief Progress Note Patient Name: Ariyanah C Horkey DOB: 1970-04-19 MRN: [id number]   Date of Service  12/22/2021  HPI/Events of Note  51 year old woman admitted to ICU with acute respiratory failure from likely pneumonia. Has had labs and imaging done in ER. Ground team just consulted and she has just now been placed on BIPAP. On 01/01/39%, her o2 sat is 98 and her RR is 22. BP is ok. I am told she is from home and that she is awake and alert.   eICU Interventions  Covid, flu, resp panel is being sent S/p ceftriaxone and doxycycline - avoid agents that prolong Qtc I would suggest adding on Vancomycin given severity of illness and using broader spectrum agents for now till cultures result Aggressively replete electrolytes Trend LA  High risk for intubation. D/w CCM NP Ouma who has just started her on Bipap but will need an ETT if quick turn around not noted.  DVT ppx  Call e link for assistance  Will need a bronch when stabilizes, unclear if underlying lung mass also present     Intervention Category Major Interventions: Respiratory failure - evaluation and management;Sepsis - evaluation and management Evaluation Type: New Patient Evaluation  Oretha Milch 12/22/2021, 11:33 PM

## 2021-12-22 NOTE — Hospital Course (Signed)
10 days sob cough/ resp distress on [place] Low potassium. No wbc count.  White out on left side chest on ct- due to pneumonia. Prolong qtc on EKG.  3 L RR .

## 2021-12-22 NOTE — H&P (Addendum)
History and Physical    Chief Complaint: Respiratory distress   HISTORY OF PRESENT ILLNESS: Janet Hensley is an 51 y.o. female seen in the emergency room for respiratory distress that is progressively getting worse past 7 to 10 days.  Patient reports compliance with cough and shortness of breath but no fevers or chills.  Patient reports chest pain with coughing and deep breathing.  No trauma reported.  States that she has been taking care of her family member with cancer.  Patient came to [name] by EMS when she was found to be hypoxic at 89% on room patient received 3 DuoNeb.  12 magnesium patient was also given normal saline bolus as she was hypotensive.  Patient has a longstanding history of tobacco abuse.     Pt has PMH as below: Past Medical History:  Diagnosis Date   Asthma 01/15/2012     Review of Systems  Unable to perform ROS: Acuity of condition      No Known Allergies   History reviewed. No pertinent surgical history.    Social History   Socioeconomic History   Marital status: Legally Separated    Spouse name: Not on file   Number of children: Not on file   Years of education: Not on file   Highest education level: Not on file  Occupational History   Not on file  Tobacco Use   Smoking status: Not on file   Smokeless tobacco: Not on file  Substance and Sexual Activity   Alcohol use: Not on file   Drug use: Not on file   Sexual activity: Not on file  Other Topics Concern   Not on file  Social History Narrative   Not on file   Social Determinants of Health   Financial Resource Strain: Not on file  Food Insecurity: Not on file  Transportation Needs: Not on file  Physical Activity: Not on file  Stress: Not on file  Social Connections: Not on file      CURRENT MEDS:  Current Facility-Administered Medications (Respiratory):    ipratropium-albuterol (DUONEB) 0.5-2.5 (3) MG/3ML nebulizer solution 3 mL  Current Outpatient Medications (Respiratory):     benzonatate (TESSALON) 100 MG capsule, Take 100 mg by mouth every 8 (eight) hours. Take 1 tablet every eight hours for seven days.  Current Facility-Administered Medications (Analgesics):    acetaminophen (TYLENOL) tablet 650 mg **OR** acetaminophen (TYLENOL) suppository 650 mg   HYDROcodone-acetaminophen (NORCO/VICODIN) 5-325 MG per tablet 1 tablet   morphine (PF) 2 MG/ML injection 2 mg  Current Outpatient Medications (Analgesics):    oxyCODONE-acetaminophen (PERCOCET) 10-325 MG tablet, Take 1 tablet by mouth 4 (four) times daily as needed for pain.  Current Facility-Administered Medications (Hematological):    heparin injection 5,000 Units   Current Facility-Administered Medications (Other):    [START ON 12/23/2021] cefTRIAXone (ROCEPHIN) 1 g in sodium chloride 0.9 % 100 mL IVPB   [START ON 12/23/2021] doxycycline (VIBRAMYCIN) 100 mg in sodium chloride 0.9 % 250 mL IVPB   lactated ringers infusion   nicotine (NICODERM CQ - dosed in mg/24 hours) patch 21 mg   potassium chloride 10 mEq in 100 mL IVPB   sodium chloride flush (NS) 0.9 % injection 3 mL  Current Outpatient Medications (Other):    cyclobenzaprine (FLEXERIL) 10 MG tablet, Take 10 mg by mouth as needed (for neck pain).   LORazepam (ATIVAN) 0.5 MG tablet, Take 0.5-1 mg by mouth at bedtime as needed for sleep.   NARCAN 4 MG/0.1ML LIQD nasal spray  kit, Place 1 spray into the nose once.    ED Course: Pt in Ed patient is alert awake oriented with O2 sats is 2 L nasal cannula tachypneic.  Initial EKG shows sinus tachycardia with a QTc of 490. Vitals:   12/22/21 1820 12/22/21 1900 12/22/21 1909 12/22/21 2226  BP:  115/69  124/77  Pulse:  (!) 122  (!) 118  Resp:  (!) 35  (!) 43  Temp:   98.1 F (36.7 C)   TempSrc:   Axillary   SpO2:  94%  94%  Weight: 86.6 kg     Height: _0  (1.6 m)     Chest x-ray done in the emergency room shows postoperative day pneumonia with question of malignancy CT of the chest done today is  showing complete whiteout of the left lung. No intake/output data recorded. SpO2: 94 % O2 Flow Rate (L/min): 4 L/min Blood work in ed shows  Results for orders placed or performed during the hospital encounter of 12/22/21 (from the past 48 hour(s))  Lactic acid, plasma     Status: Abnormal   Collection Time: 12/22/21  6:36 PM  Result Value Ref Range   Lactic Acid, Venous 5.1 (HH) 0.5 - 1.9 mmol/L    Comment: CRITICAL RESULT CALLED TO, READ BACK BY AND VERIFIED WITH RACHEL MERKLE AT 1938 12/22/2021 DLB Performed at [facility], [address] [place], [place] [address]   Comprehensive metabolic panel     Status: Abnormal   Collection Time: 12/22/21  6:36 PM  Result Value Ref Range   Sodium 134 (L) 135 - 145 mmol/L   Potassium 2.7 (LL) 3.5 - 5.1 mmol/L    Comment: CRITICAL RESULT CALLED TO, READ BACK BY AND VERIFIED WITH RACHEL MERKLE AT 1938 12/22/2021 DLB    Chloride 98 98 - 111 mmol/L   CO2 22 22 - 32 mmol/L   Glucose, Bld 110 (H) 70 - 99 mg/dL    Comment: Glucose reference range applies only to samples taken after fasting for at least 8 hours.   BUN 33 (H) 6 - 20 mg/dL   Creatinine, Ser 0.83 0.44 - 1.00 mg/dL   Calcium 7.3 (L) 8.9 - 10.3 mg/dL   Total Protein 5.3 (L) 6.5 - 8.1 g/dL   Albumin <1.5 (L) 3.5 - 5.0 g/dL   AST 168 (H) 15 - 41 U/L   ALT 40 0 - 44 U/L   Alkaline Phosphatase 56 38 - 126 U/L   Total Bilirubin 1.3 (H) 0.3 - 1.2 mg/dL   GFR, Estimated >60 >60 mL/min    Comment: (NOTE) Calculated using the CKD-EPI Creatinine Equation (2021)    Anion gap 14 5 - 15    Comment: Performed at [facility], [address] [place], [place] [address]  CBC with Differential     Status: Abnormal   Collection Time: 12/22/21  6:36 PM  Result Value Ref Range   WBC 6.0 4.0 - 10.5 K/uL   RBC 3.57 (L) 3.87 - 5.11 MIL/uL   Hemoglobin 11.3 (L) 12.0 - 15.0 g/dL   HCT 32.7 (L) 36.0 - 46.0 %   MCV 91.6 80.0 - 100.0 fL   MCH 31.7 26.0 - 34.0 pg   MCHC 34.6  30.0 - 36.0 g/dL   RDW 13.8 11.5 - 15.5 %   Platelets 217 150 - 400 K/uL   nRBC 0.0 0.0 - 0.2 %   Neutrophils Relative % 87 %   Neutro Abs 5.2 1.7 - 7.7  K/uL   Lymphocytes Relative 9 %   Lymphs Abs 0.6 (L) 0.7 - 4.0 K/uL   Monocytes Relative 2 %   Monocytes Absolute 0.1 0.1 - 1.0 K/uL   Eosinophils Relative 0 %   Eosinophils Absolute 0.0 0.0 - 0.5 K/uL   Basophils Relative 1 %   Basophils Absolute 0.0 0.0 - 0.1 K/uL   WBC Morphology MILD LEFT SHIFT (1-5% METAS, OCC MYELO, OCC BANDS)    Smear Review Normal platelet morphology    Immature Granulocytes 1 %   Abs Immature Granulocytes 0.07 0.00 - 0.07 K/uL   Tear Drop Cells PRESENT     Comment: Performed at [facility], [address], [place], [place] [address]  Brain natriuretic peptide     Status: None   Collection Time: 12/22/21  6:36 PM  Result Value Ref Range   B Natriuretic Peptide 15.2 0.0 - 100.0 pg/mL    Comment: Performed at [facility], [address] [place], [place] [address]  Blood gas, venous     Status: Abnormal   Collection Time: 12/22/21  6:36 PM  Result Value Ref Range   pH, Ven 7.37 7.25 - 7.43   pCO2, Ven 43 (L) 44 - 60 mmHg   pO2, Ven <31 (LL) 32 - 45 mmHg   Bicarbonate 24.9 20.0 - 28.0 mmol/L   Acid-base deficit 0.6 0.0 - 2.0 mmol/L   O2 Saturation 31.7 %   Patient temperature 37.0    Collection site VEIN     Comment: Performed at [facility], [address] [place], [place] [address]  Troponin I (High Sensitivity)     Status: None   Collection Time: 12/22/21  6:36 PM  Result Value Ref Range   Troponin I (High Sensitivity) 5 <18 ng/L    Comment: (NOTE) Elevated high sensitivity troponin I (hsTnI) values and significant  changes across serial measurements may suggest ACS but many other  chronic and acute conditions are known to elevate hsTnI results.  Refer to the "Links" section for chest pain algorithms and additional  guidance. Performed at [facility],  [address] [place], [place] [address]   CK     Status: Abnormal   Collection Time: 12/22/21  6:36 PM  Result Value Ref Range   Total CK 26 (L) 38 - 234 U/L    Comment: Performed at [facility], [address] [place], [place] [address]    In Ed pt received  Meds ordered this encounter  Medications   lactated ringers bolus 1,000 mL   DISCONTD: lactated ringers infusion   DISCONTD: cefTRIAXone (ROCEPHIN) 2 g in sodium chloride 0.9 % 100 mL IVPB    Order Specific Question:   Antibiotic Indication:    Answer:   CAP   doxycycline (VIBRA-TABS) tablet 100 mg   iohexol (OMNIPAQUE) 350 MG/ML injection 80 mL   potassium chloride SA (KLOR-CON M) CR tablet 40 mEq   potassium chloride 10 mEq in 100 mL IVPB   sodium chloride 0.9 % bolus 1,000 mL   morphine (PF) 4 MG/ML injection 4 mg   ipratropium-albuterol (DUONEB) 0.5-2.5 (3) MG/3ML nebulizer solution 3 mL   doxycycline (VIBRAMYCIN) 100 mg in sodium chloride 0.9 % 250 mL IVPB    Order Specific Question:   Antibiotic Indication:    Answer:   CAP   cefTRIAXone (ROCEPHIN) 1 g in sodium chloride 0.9 % 100 mL IVPB    Order Specific Question:   Antibiotic Indication:  Answer:   CAP   lactated ringers infusion   heparin injection 5,000 Units   sodium chloride flush (NS) 0.9 % injection 3 mL   OR Linked Order Group    acetaminophen (TYLENOL) tablet 650 mg    acetaminophen (TYLENOL) suppository 650 mg   HYDROcodone-acetaminophen (NORCO/VICODIN) 5-325 MG per tablet 1 tablet   morphine (PF) 2 MG/ML injection 2 mg   nicotine (NICODERM CQ - dosed in mg/24 hours) patch 21 mg    Unresulted Labs (From admission, onward)     Start     Ordered   12/23/21 0500  Comprehensive metabolic panel  Tomorrow morning,   STAT        12/22/21 2151   12/23/21 0500  CBC  Tomorrow morning,   STAT        12/22/21 2151   12/22/21 2200  Ethanol  Once,   AD        12/22/21 2200   12/22/21 2200  HIV Antibody (routine testing w rflx)  Once,   R         12/22/21 2200   12/22/21 2151  Blood gas, venous  Once,   STAT        12/22/21 2151   12/22/21 2148  Procalcitonin - Baseline  ONCE - URGENT,   URGENT        12/22/21 2151   12/22/21 1953  Magnesium  Add-on,   AD        12/22/21 1952   12/22/21 1953  TSH  Add-on,   AD        12/22/21 1952   12/22/21 1819  Lactic acid, plasma  (Undifferentiated presentation (screening labs and basic nursing orders))  Now then every 2 hours,   STAT      12/22/21 1819   12/22/21 1819  Blood Culture (routine x 2)  (Undifferentiated presentation (screening labs and basic nursing orders))  BLOOD CULTURE X 2,   STAT      12/22/21 1819   12/22/21 1819  Urinalysis, Complete w Microscopic  (Undifferentiated presentation (screening labs and basic nursing orders))  ONCE - URGENT,   URGENT        12/22/21 1819   12/22/21 1819  Urine Culture  (Undifferentiated presentation (screening labs and basic nursing orders))  ONCE - URGENT,   URGENT       Question:  Indication  Answer:  Sepsis   12/22/21 1819             Admission Imaging : CT Angio Chest PE W/Cm &/Or Wo Cm  Result Date: 12/22/2021 CLINICAL DATA:  Labored breathing; PE suspected EXAM: CT ANGIOGRAPHY CHEST WITH CONTRAST TECHNIQUE: Multidetector CT imaging of the chest was performed using the standard protocol during bolus administration of intravenous contrast. Multiplanar CT image reconstructions and MIPs were obtained to evaluate the vascular anatomy. RADIATION DOSE REDUCTION: This exam was performed according to the departmental dose-optimization program which includes automated exposure control, adjustment of the mA and/or kV according to patient size and/or use of iterative reconstruction technique. CONTRAST:  43m OMNIPAQUE IOHEXOL 350 MG/ML SOLN COMPARISON:  Radiographs earlier today FINDINGS: Cardiovascular: Satisfactory opacification of the central pulmonary arteries. Respiratory motion decreases sensitivity for evaluation of pulmonary embolism in  the segmental pulmonary arteries. No evidence of pulmonary embolism. Normal heart size. No pericardial effusion. Mediastinum/Nodes: Numerous enlarged mediastinal and right hilar nodes. For example right paratracheal node measuring 1.4 cm and right hilar node measuring 1.3 cm. The left hilum is silhouetted by adjacent atelectasis/consolidation  of the left lung. Unremarkable esophagus. Normal thyroid. Lungs/Pleura: Near-complete opacification of the left lung with low-attenuation fluid/infiltrate. There are a few areas of peribronchovascular crowding suggesting atelectasis however overall lung volume is largely preserved. Segmental bronchial narrowing/occlusion in the right upper lobe, lingula, and right lower lobe. Trace left pleural effusion. No pneumothorax. Paraseptal emphysema in the apices. Innumerable small pulmonary nodules in the right lower lobe and right middle lobe in a peribronchovascular distribution. More confluence nodules with subtle adjacent ground-glass opacity in the right upper lobe measuring up to 18.5 mm. Upper Abdomen: Hepatic steatosis. Round hypoattenuating lesion in the liver is not significantly changed from 2015 and should be benign cysts. No follow-up recommended. Cyst in the right kidney. No follow-up recommended. Musculoskeletal: No chest wall abnormality. No acute osseous findings. Review of the MIP images confirms the above findings. IMPRESSION: 1. Near-complete opacification of the left lung with low-attenuation fluid/infiltrate. This is likely in part due to pneumonia possibly postobstructive in nature given mucus and secretions in multiple segmental bronchi throughout the left lung. However given the mediastinal adenopathy and nodularity in the right lung underlying malignancy is difficult to exclude. Short interval follow-up after treatment is recommended. 2. Multiple indeterminate pulmonary nodules in the right lung may be infectious/inflammatory however metastases are not  excluded. 3. Mediastinal and right hilar adenopathy. Electronically Signed   By: Placido Sou M.D.   On: 12/22/2021 20:35   DG Chest Port 1 View  Result Date: 12/22/2021 CLINICAL DATA:  Possible sepsis. EXAM: PORTABLE CHEST 1 VIEW COMPARISON:  None Available. FINDINGS: Opacification of the LEFT hemithorax noted and may represent consolidation, atelectasis and/or pleural effusion. The RIGHT lung is clear. There is no evidence of pneumothorax.  No midline shift noted. IMPRESSION: Opacification of the LEFT hemithorax which may represent consolidation, atelectasis and/or pleural effusion. Electronically Signed   By: Margarette Canada M.D.   On: 12/22/2021 19:06      Physical Examination: Vitals:   12/22/21 1820 12/22/21 1900 12/22/21 1909 12/22/21 2226  BP:  115/69  124/77  Pulse:  (!) 122  (!) 118  Temp:   98.1 F (36.7 C)   Resp:  (!) 35  (!) 43  Height: _0  (1.6 m)     Weight: 86.6 kg     SpO2:  94%  94%  TempSrc:   Axillary   BMI (Calculated): 33.84      Physical Exam Vitals and nursing note reviewed.  Constitutional:      General: She is not in acute distress.    Appearance: Normal appearance. She is not ill-appearing, toxic-appearing or diaphoretic.     Interventions: She is not intubated. HENT:     Head: Normocephalic and atraumatic.     Right Ear: Hearing and external ear normal.     Left Ear: Hearing and external ear normal.     Nose: Nose normal. No nasal deformity.     Mouth/Throat:     Lips: Pink.     Mouth: Mucous membranes are moist.     Tongue: No lesions.     Pharynx: Oropharynx is clear.  Eyes:     Extraocular Movements: Extraocular movements intact.     Pupils: Pupils are equal, round, and reactive to light.  Neck:     Vascular: No carotid bruit.  Cardiovascular:     Rate and Rhythm: Normal rate and regular rhythm.     Pulses: Normal pulses.     Heart sounds: Normal heart sounds.  Pulmonary:  Effort: Tachypnea and respiratory distress present. No  bradypnea, accessory muscle usage, prolonged expiration or retractions. She is not intubated.     Breath sounds: Examination of the right-middle field reveals wheezing. Examination of the left-middle field reveals wheezing. Examination of the right-lower field reveals wheezing. Examination of the left-lower field reveals wheezing. Wheezing present.  Abdominal:     General: Bowel sounds are normal. There is no distension.     Palpations: Abdomen is soft. There is no mass.     Tenderness: There is no abdominal tenderness. There is no guarding.     Hernia: No hernia is present.  Musculoskeletal:     Right lower leg: No edema.     Left lower leg: No edema.  Skin:    General: Skin is warm.  Neurological:     General: No focal deficit present.     Mental Status: She is alert and oriented to person, place, and time.     Cranial Nerves: Cranial nerves 2-12 are intact.     Motor: Motor function is intact.  Psychiatric:        Attention and Perception: Attention normal.        Mood and Affect: Mood normal.        Speech: Speech normal.        Behavior: Behavior normal. Behavior is cooperative.        Cognition and Memory: Cognition normal.      Assessment and Plan: > Acute respiratory failure with hypoxia/respiratory distress: Blood pressure 124/77, pulse (!) 118, temperature 98.1 F (36.7 C), temperature source Axillary, resp. rate (!) 43, height _0  (1.6 m), weight 86.6 kg, last menstrual period 01/16/2016, SpO2 94 %. SpO2: 94 % O2 Flow Rate (L/min): 4 L/min Will continue patient on empiric broad-spectrum IV antibiotics. Will continue with steroid therapy and supplemental oxygen as deemed appropriate. Pulmonary consult.  NIPPV . Will admit patient to stepdown unit.  > Electrolyte abnormalities: We will replace and follow levels.  > Prolonged QT on EKG: EKG QTc is 492. Avoid medications that may prolong QT Monitor electrolytes.  > Tobacco abuse: Nicotine patch   DVT  prophylaxis:  Heparin   Code Status:  Full code    Family Communication:  Slager,Chris (Spouse) [phone]     Disposition Plan:  Home    Consults called:  Intensivist  : Dr. Mortimer Fries.  Admission status: Inpatient.    Unit/ Expected LOS: Stepdown / 3 days.   Para Skeans MD [facility]  6 PM- 2 AM. Please contact me via secure Chat 6 PM-2 AM. [phone] ( Pager ) To contact the [facility] Attending or Consulting provider [place] or covering provider during after hours [place], for this patient.   Check the care team in [facility] and look for a) attending/consulting [facility] provider listed and b) the [facility] team listed Log into [email or web address] and use [place]'s universal password to access. If you do not have the password, please contact the hospital operator. Locate the [facility] provider you are looking for under [facility] and page to a number that you can be directly reached. If you still have difficulty reaching the provider, please page the [facility] (Director on Call) for the Hospitalists listed on amion for assistance. [email or web address] 12/22/2021, 10:43 PM

## 2021-12-22 NOTE — Progress Notes (Signed)
CODE SEPSIS - PHARMACY COMMUNICATION  **Broad Spectrum Antibiotics should be administered within 1 hour of Sepsis diagnosis**  Time Code Sepsis Called/Page Received: 1900  Antibiotics Ordered: ceftriaxone  Time of 1st antibiotic administration: 1950  Additional action taken by pharmacy: N/A   Barrie Folk ,PharmD Clinical Pharmacist  12/22/2021  7:01 PM

## 2021-12-22 NOTE — ED Triage Notes (Signed)
BIBA from home. Patient has been sick x 11 days. Seen at [facility], d/c.  Room air sat. 90   Given en route by EMS - 600 NS  2G Mag 3 duonebs Solumedrol 125  RR 50 upon EMS arrival, ET 24, Tachy at 140 ST

## 2021-12-22 NOTE — ED Notes (Signed)
Bedside report to [place], [place]

## 2021-12-22 NOTE — Progress Notes (Signed)
Pt transported to ICU 10 on Bipap without incident. Pt remains on Bipap and is tol well at this time. Report given to ICU RT.

## 2021-12-22 NOTE — Consult Note (Incomplete)
NAME:  Janet Hensley, MRN:  [id number], DOB:  1970/05/23, LOS: 0 ADMISSION DATE:  12/22/2021, CONSULTATION DATE:  12/22/2021 REFERRING MD: Florina Ou  CHIEF COMPLAINT:  SOB   HPI  51 y.o with significant PMH of Asthma, tobacco abuse , chronic neck and back pain, headache, anxiety and depression who presented to the ED with chief complaints of progressive shortness of breath.  Patient presented to outside ED on 11/19    ED Course: Initial vital signs showed HR of beats/minute, BP mm Hg, the RR 30 breaths/minute, and the oxygen saturation % on and a temperature of 98.62F (36.9C).  Pertinent Labs/Diagnostics Findings: Chemistry:Na+/ K+: 134/2.7  Glucose 110 : BUN/Cr.:33/0.83 Calcium: 7.3  AST/ALT:168/40 CBC: WBC: 6.0 Other Lab findings:   PCT:15.18  Lactic acid: 5.1 COVID PCR: Negative Venous Blood Gas result:  pO2 <31; pCO2 43; pH 7.37;  HCO3 24.9, %O2 Sat 31.7.  Imaging:  CXR>Opacification of the LEFT hemithorax which may represent consolidation, atelectasis and/or pleural effusion. CTH> CTA Chest> Near-complete opacification of the left lung with low-attenuation fluid/infiltrate. This is likely in part due to pneumonia possibly postobstructive in nature given mucus and secretions in multiple segmental bronchi throughout the left lung.  Patient given 30 cc/kg of fluids and started on broad-spectrum antibiotics Vanco cefepime and Flagyl for sepsis with septic shock. Patient admitted by hospitalist service to stepdown unit. Due to progressive short of breath and high risk for decompensation and intubation, PCCM was consulted.   Past Medical History  ***  Significant Hospital Events   ***  Consults:  ***  Procedures:  ***  Significant Diagnostic Tests:  : Chest Xray> : Abdominal xray> : Noncontrast CT head> : CTA abdomen and pelvis> : CTA Chest>  Interim History / Subjective:    Micro Data:  : SARS-CoV-2 PCR> negative : Influenza PCR> negative : Blood culture x2> :  Urine Culture> : MRSA PCR>>  : Strep pneumo urinary antigen> : Legionella urinary antigen> : Mycoplasma pneumonia>  Antimicrobials:  Vancomycin Cefepime Azithromycin Ceftriaxone Metronidazole  OBJECTIVE  Blood pressure 124/77, pulse (!) 118, temperature 98.1 F (36.7 C), temperature source Axillary, resp. rate (!) 43, height _0  (1.6 m), weight 86.6 kg, last menstrual period 01/16/2016, SpO2 94 %.       No intake or output data in the 24 hours ending 12/22/21 2249 Filed Weights   12/22/21 1820  Weight: 86.6 kg     Physical Examination  GENERAL: year-old critically ill patient lying in the bed with no acute distress.  EYES: Pupils equal, round, reactive to light and accommodation. No scleral icterus. Extraocular muscles intact.  HEENT: Head atraumatic, normocephalic. Oropharynx and nasopharynx clear.  NECK:  Supple, no jugular venous distention. No thyroid enlargement, no tenderness.  LUNGS: Normal breath sounds bilaterally, no wheezing, rales,rhonchi or crepitation. No use of accessory muscles of respiration.  CARDIOVASCULAR: S1, S2 normal. No murmurs, rubs, or gallops.  ABDOMEN: Soft, nontender, nondistended. Bowel sounds present. No organomegaly or mass.  EXTREMITIES: Upper and lower extremities are atraumatic in appearance without tenderness or deformity. No swelling or erythema. Full range of motion is noted to all joints. Muscle strength is 5/5 bilaterally. Tendon function is normal. Capillary refill is less than 3 seconds in all extremities. Pulses palpable.  NEUROLOGIC:The patient is awake, alert and oriented to person, place, and time with normal speech. Motor function is normal with muscle strength 5/5 bilaterally to upper and lower extremities. Sensation is intact bilaterally. Reflexes 2+ bilaterally. Cranial nerves are intact.  Cerebellar function is intact. Memory is normal and thought process is intact. Gait not checked.  PSYCHIATRIC: Appropriate mood and affect. The  patient is  SKIN: No obvious rash, lesion, or ulcer.   Labs/imaging that I {ACTIONS; HAVE/HAVE NOT:19434}personally reviewed  (right click and "Reselect all SmartList Selections" daily)     Labs   CBC: Recent Labs  Lab 12/22/21 1836  WBC 6.0  NEUTROABS 5.2  HGB 11.3*  HCT 32.7*  MCV 91.6  PLT 552    Basic Metabolic Panel: Recent Labs  Lab 12/22/21 1836  NA 134*  K 2.7*  CL 98  CO2 22  GLUCOSE 110*  BUN 33*  CREATININE 0.83  CALCIUM 7.3*   GFR: Estimated Creatinine Clearance: 83.7 mL/min (by C-G formula based on SCr of 0.83 mg/dL). Recent Labs  Lab 12/22/21 1836  WBC 6.0  LATICACIDVEN 5.1*    Liver Function Tests: Recent Labs  Lab 12/22/21 1836  AST 168*  ALT 40  ALKPHOS 56  BILITOT 1.3*  PROT 5.3*  ALBUMIN <1.5*   No results for input(s): "LIPASE", "AMYLASE" in the last 168 hours. No results for input(s): "AMMONIA" in the last 168 hours.  ABG    Component Value Date/Time   HCO3 24.9 12/22/2021 1836   ACIDBASEDEF 0.6 12/22/2021 1836   O2SAT 31.7 12/22/2021 1836     Coagulation Profile: No results for input(s): "INR", "PROTIME" in the last 168 hours.  Cardiac Enzymes: Recent Labs  Lab 12/22/21 1836  CKTOTAL 26*    HbA1C: No results found for: "HGBA1C"  CBG: No results for input(s): "GLUCAP" in the last 168 hours.  Review of Systems:   ***  Past Medical History  She,  has a past medical history of Asthma (01/15/2012).   Surgical History   History reviewed. No pertinent surgical history.   Social History      Family History   Her family history is not on file.   Allergies No Known Allergies   Home Medications  Prior to Admission medications   Medication Sig Start Date End Date Taking? Authorizing Provider  benzonatate (TESSALON) 100 MG capsule Take 100 mg by mouth every 8 (eight) hours. Take 1 tablet every eight hours for seven days. 12/15/21 12/22/21 Yes [provider]  cyclobenzaprine (FLEXERIL) 10 MG tablet  Take 10 mg by mouth as needed (for neck pain). 11/23/17  Yes [provider]  LORazepam (ATIVAN) 0.5 MG tablet Take 0.5-1 mg by mouth at bedtime as needed for sleep. 12/02/21  Yes [provider]  NARCAN 4 MG/0.1ML LIQD nasal spray kit Place 1 spray into the nose once. 11/14/21  Yes [provider]  oxyCODONE-acetaminophen (PERCOCET) 10-325 MG tablet Take 1 tablet by mouth 4 (four) times daily as needed for pain.   Yes [provider]      Active Hospital Problem list   ***  Assessment & Plan:  ***  Best practice:  Diet:  {CEYE:23361} Pain/Anxiety/Delirium protocol (if indicated): {Pain/Anxiety/Delirium:26941} VAP protocol (if indicated): {VAP:29640} DVT prophylaxis: {DVT Prophylaxis:26933} GI prophylaxis: {GI:26934} Glucose control:  {Glucose Control:26935} Central venous access:  {Central Venous Access:26936} Arterial line:  {Central Venous Access:26936} Foley:  {Central Venous Access:26936} Mobility:  {Mobility:26937}  PT consulted: {PT Consult:26938} Last date of multidisciplinary goals of care discussion [***] Code Status:  {Code Status:26939} Disposition: ***   = Goals of Care = Code Status Order: FULL  Primary Emergency Contact: Spoelstra,Chris Wishes to pursue full aggressive treatment and intervention options, including CPR and intubation, but goals of  care will be addressed on going with family if that should become necessary.  Critical care time: 45 minutes       Rufina Falco, DNP, CCRN, FNP-C, AGACNP-BC Acute Care Nurse Practitioner [facility]  PCCM on call pager [phone] until 7 am

## 2021-12-22 NOTE — ED Provider Notes (Signed)
[facility] Provider Note    Event Date/Time   First MD Initiated Contact with Janet 12/22/21 1810     (approximate)   History   Respiratory Distress   HPI  Janet Hensley is a 51 y.o. female with past medical history significant for tobacco use, chronic neck pain, who presents to the emergency department with shortness of breath.  Janet Hensley is 10 days of progressively worsening shortness of breath with cough.  Was evaluated at an outside emergency department and tested negative for COVID and RSV.  Endorses worsening cough and shortness of breath.  When EMS arrived Janet was found to be hypoxic at 89% on room air.  Significant increased work of breathing with notes of wheezing on exam.  Janet received 3 DuoNeb treatments, IV Solu-Medrol and IV magnesium 2 g prior to arrival.  Initial blood pressure was 90 systolic Janet was given a 600 normal saline bolus and had improvement of her blood pressure.  Janet denies history of DVT or PE.  No chest pain at this time.  No recent hospitalizations.  Tobacco use.     Physical Exam     Most recent vital signs: Vitals:   12/22/21 1900 12/22/21 1909  BP: 115/69   Pulse: (!) 122   Resp: (!) 35   Temp:  98.1 F (36.7 C)  SpO2: 94%     Physical Exam Constitutional:      General: She is in acute distress.     Appearance: She is well-developed.  HENT:     Head: Atraumatic.     Comments: Dry mucous membranes Eyes:     Conjunctiva/sclera: Conjunctivae normal.  Cardiovascular:     Rate and Rhythm: Regular rhythm. Tachycardia present.     Heart sounds: No murmur heard. Pulmonary:     Effort: Respiratory distress present.     Breath sounds: Wheezing present.     Comments: 89% on room air, placed on 2 L nasal cannula with improvement.  Rhonchi throughout all lung fields.  Tachypneic in the 50s. Abdominal:     General: There is no distension.     Tenderness: There is no abdominal tenderness.   Musculoskeletal:        General: No deformity. Normal range of motion.     Cervical back: Normal range of motion.     Right lower leg: No edema.     Left lower leg: No edema.  Skin:    General: Skin is warm.     Capillary Refill: Capillary refill takes less than 2 seconds.  Neurological:     Mental Status: She is alert. Mental status is at baseline.  Psychiatric:        Mood and Affect: Mood normal.          IMPRESSION / MDM / ASSESSMENT AND PLAN / ED COURSE  I reviewed the triage vital signs and the nursing notes.  On arrival to the emergency department with EMS Janet with hypoxia and increased work of breathing.  When EMS arrived Janet's blood pressure found to be 90 systolic, received 600 of normal saline with improvement to 100/60.  Given 2 g of IV magnesium, IV Solu-Medrol 125 mg, 3 DuoNeb treatments.  Differential diagnosis includes COVID, bacterial pneumonia, COPD, myocarditis, pulmonary edema, pulmonary embolism.  Low risk Wells criteria will obtain D-dimer.  Given her tachycardia and tachypnea with concern for infectious process blood and urine cultures obtained.  Ordered 1 L of IV fluids, felt that 30 cc/kg  of IV fluids may be detrimental given her increased work of breathing, will give 1 L and reevaluate.  Lactic acid currently pending.     EKG  I, Corena Herter, the attending physician, personally viewed and interpreted this ECG.   Rate: Tachycardia  Rhythm: Normal sinus  Axis: Normal  Intervals: QTc 490, appears prolonged  ST&T Change: None  Sinus tachycardia tachycardic while on cardiac telemetry.  RADIOLOGY I independently reviewed imaging, my interpretation of imaging: Chest x-ray -complete whiteout of the left lung.  Concerning for postobstructive pneumonia with new malignancy.  Will add on a CT angiography to further evaluate.      ED Results / Procedures / Treatments   Labs (all labs ordered are listed, but only abnormal results are  displayed) Labs interpreted as -  Significantly elevated lactic acid of 5.1.  Hypokalemia.  Mild elevation of LFTs.  Labs Reviewed  LACTIC ACID, PLASMA - Abnormal; Notable for the following components:      Result Value   Lactic Acid, Venous 5.1 (*)    All other components within normal limits  COMPREHENSIVE METABOLIC PANEL - Abnormal; Notable for the following components:   Sodium 134 (*)    Potassium 2.7 (*)    Glucose, Bld 110 (*)    BUN 33 (*)    Calcium 7.3 (*)    Total Protein 5.3 (*)    Albumin <1.5 (*)    AST 168 (*)    Total Bilirubin 1.3 (*)    All other components within normal limits  CBC WITH DIFFERENTIAL/PLATELET - Abnormal; Notable for the following components:   RBC 3.57 (*)    Hemoglobin 11.3 (*)    HCT 32.7 (*)    Lymphs Abs 0.6 (*)    All other components within normal limits  BLOOD GAS, VENOUS - Abnormal; Notable for the following components:   pCO2, Ven 43 (*)    pO2, Ven <31 (*)    All other components within normal limits  CULTURE, BLOOD (ROUTINE X 2)  CULTURE, BLOOD (ROUTINE X 2)  URINE CULTURE  BRAIN NATRIURETIC PEPTIDE  LACTIC ACID, PLASMA  URINALYSIS, COMPLETE (UACMP) WITH MICROSCOPIC  MAGNESIUM  TSH  ETHANOL  CK  HIV ANTIBODY (ROUTINE TESTING W REFLEX)  TROPONIN I (HIGH SENSITIVITY)  TROPONIN I (HIGH SENSITIVITY)   Janet started on antibiotics to cover for community-acquired pneumonia.  Given doxycycline instead of azithromycin given her QT prolongation.  No MRSA risk factors.  No history of Pseudomonas.  9:40 PM Sepsis reevaluation, improved perfusion do not feel that further IV fluids are necessary at this time.  Janet admitted for acute hypoxic respiratory failure in the setting of concern for postobstructive pneumonia.  Hospitalist evaluated the Janet in the emergency department.  PROCEDURES:  Critical Care performed: Yes  .Critical Care  Performed by: Corena Herter, MD Authorized by: Corena Herter, MD   Critical care  provider statement:    Critical care time (minutes):  45   Critical care time was exclusive of:  Separately billable procedures and treating other patients   Critical care was necessary to treat or prevent imminent or life-threatening deterioration of the following conditions:  Respiratory failure   Critical care was time spent personally by me on the following activities:  Development of treatment plan with Janet or surrogate, discussions with consultants, evaluation of Janet's response to treatment, examination of Janet, ordering and review of laboratory studies, ordering and review of radiographic studies, ordering and performing treatments and interventions, pulse oximetry, re-evaluation of  Janet's condition and review of old charts   Janet's presentation is most consistent with acute presentation with potential threat to life or bodily function.   MEDICATIONS ORDERED IN ED: Medications  lactated ringers infusion (has no administration in time range)  cefTRIAXone (ROCEPHIN) 2 g in sodium chloride 0.9 % 100 mL IVPB (2 g Intravenous New Bag/Given 12/22/21 1950)  potassium chloride SA (KLOR-CON M) CR tablet 40 mEq (has no administration in time range)  potassium chloride 10 mEq in 100 mL IVPB (has no administration in time range)  sodium chloride 0.9 % bolus 1,000 mL (has no administration in time range)  morphine (PF) 4 MG/ML injection 4 mg (has no administration in time range)  ipratropium-albuterol (DUONEB) 0.5-2.5 (3) MG/3ML nebulizer solution 3 mL (has no administration in time range)  lactated ringers bolus 1,000 mL (0 mLs Intravenous Stopped 12/22/21 1917)  doxycycline (VIBRA-TABS) tablet 100 mg (100 mg Oral Given 12/22/21 1952)  iohexol (OMNIPAQUE) 350 MG/ML injection 80 mL (80 mLs Intravenous Contrast Given 12/22/21 2007)    FINAL CLINICAL IMPRESSION(S) / ED DIAGNOSES   Final diagnoses:  Respiratory distress  Hypoxia     Rx / DC Orders   ED Discharge Orders      None        Note:  This document was prepared using Dragon voice recognition software and may include unintentional dictation errors.   Corena Herter, MD 12/22/21 2140

## 2021-12-23 ENCOUNTER — Inpatient Hospital Stay: Payer: 59

## 2021-12-23 DIAGNOSIS — R0603 Acute respiratory distress: Secondary | ICD-10-CM | POA: Diagnosis not present

## 2021-12-23 LAB — COMPREHENSIVE METABOLIC PANEL
ALT: 36 U/L (ref 0–44)
AST: 128 U/L — ABNORMAL HIGH (ref 15–41)
Albumin: <1.5 g/dL — ABNORMAL LOW (ref 3.5–5.0)
Alkaline Phosphatase: 46 U/L (ref 38–126)
Anion gap: 15 (ref 5–15)
BUN: 24 mg/dL — ABNORMAL HIGH (ref 6–20)
CO2: 20 mmol/L — ABNORMAL LOW (ref 22–32)
Calcium: 7.1 mg/dL — ABNORMAL LOW (ref 8.9–10.3)
Chloride: 94 mmol/L — ABNORMAL LOW (ref 98–111)
Creatinine, Ser: 0.55 mg/dL (ref 0.44–1.00)
GFR, Estimated: >60 mL/min (ref >60)
Glucose, Bld: 85 mg/dL (ref 70–99)
Potassium: 3.3 mmol/L — ABNORMAL LOW (ref 3.5–5.1)
Sodium: 129 mmol/L — ABNORMAL LOW (ref 135–145)
Total Bilirubin: 3.4 mg/dL — ABNORMAL HIGH (ref 0.3–1.2)
Total Protein: 4.9 g/dL — ABNORMAL LOW (ref 6.5–8.1)

## 2021-12-23 LAB — BLOOD CULTURE ID PANEL (REFLEXED) - BCID2

## 2021-12-23 LAB — CBC
HCT: 29.3 % — ABNORMAL LOW (ref 36.0–46.0)
Hemoglobin: 9.9 g/dL — ABNORMAL LOW (ref 12.0–15.0)
MCH: 31.4 pg (ref 26.0–34.0)
MCHC: 33.8 g/dL (ref 30.0–36.0)
MCV: 93 fL (ref 80.0–100.0)
Platelets: 202 10*3/uL (ref 150–400)
RBC: 3.15 MIL/uL — ABNORMAL LOW (ref 3.87–5.11)
RDW: 14.2 % (ref 11.5–15.5)
WBC: 5.5 10*3/uL (ref 4.0–10.5)
nRBC: 0 % (ref 0.0–0.2)

## 2021-12-23 LAB — BLOOD GAS, ARTERIAL
Acid-base deficit: 2.7 mmol/L — ABNORMAL HIGH (ref 0.0–2.0)
Bicarbonate: 22.6 mmol/L (ref 20.0–28.0)
FIO2: 0.6 %
MECHVT: 450 mL
O2 Saturation: 96.7 %
PEEP: 5 cmH2O
Patient temperature: 37
RATE: 20 resp/min
pCO2 arterial: 40 mmHg (ref 32–48)
pH, Arterial: 7.36 (ref 7.35–7.45)
pO2, Arterial: 75 mmHg — ABNORMAL LOW (ref 83–108)

## 2021-12-23 LAB — RESPIRATORY PANEL BY PCR

## 2021-12-23 LAB — LACTIC ACID, PLASMA: Lactic Acid, Venous: 2.8 mmol/L (ref 0.5–1.9)

## 2021-12-23 LAB — BLOOD GAS, VENOUS
Acid-base deficit: 0.7 mmol/L (ref 0.0–2.0)
Bicarbonate: 24.2 mmol/L (ref 20.0–28.0)
Delivery systems: POSITIVE
O2 Saturation: 91 %
Patient temperature: 37
pCO2, Ven: 40 mmHg — ABNORMAL LOW (ref 44–60)
pH, Ven: 7.39 (ref 7.25–7.43)
pO2, Ven: 63 mmHg — ABNORMAL HIGH (ref 32–45)

## 2021-12-23 LAB — GLUCOSE, CAPILLARY
Glucose-Capillary: 85 mg/dL (ref 70–99)
Glucose-Capillary: 93 mg/dL (ref 70–99)
Glucose-Capillary: 113 mg/dL — ABNORMAL HIGH (ref 70–99)

## 2021-12-23 LAB — URINALYSIS, COMPLETE (UACMP) WITH MICROSCOPIC
Bacteria, UA: NONE SEEN
Bilirubin Urine: NEGATIVE
Glucose, UA: NEGATIVE mg/dL
Ketones, ur: NEGATIVE mg/dL
Leukocytes,Ua: NEGATIVE
Nitrite: NEGATIVE
Protein, ur: NEGATIVE mg/dL
Specific Gravity, Urine: 1.041 — ABNORMAL HIGH (ref 1.005–1.030)
pH: 5 (ref 5.0–8.0)

## 2021-12-23 LAB — SARS CORONAVIRUS 2 BY RT PCR: SARS Coronavirus 2 by RT PCR: NEGATIVE

## 2021-12-23 LAB — STREP PNEUMONIAE URINARY ANTIGEN: Strep Pneumo Urinary Antigen: NEGATIVE

## 2021-12-23 LAB — HIV ANTIBODY (ROUTINE TESTING W REFLEX): HIV Screen 4th Generation wRfx: NONREACTIVE

## 2021-12-23 LAB — MRSA NEXT GEN BY PCR, NASAL: MRSA by PCR Next Gen: NOT DETECTED

## 2021-12-23 MED ORDER — NOREPINEPHRINE 4 MG/250ML-% IV SOLN
2.0000 ug/min | INTRAVENOUS | Status: DC
  Administered 2021-12-23: 2 ug/min via INTRAVENOUS
  Administered 2021-12-24: 3 ug/min via INTRAVENOUS

## 2021-12-23 MED ORDER — ETOMIDATE 2 MG/ML IV SOLN: 20.0000 mg | Freq: Once | INTRAVENOUS | Status: DC | PRN

## 2021-12-23 MED ORDER — NOREPINEPHRINE 4 MG/250ML-% IV SOLN: 0.0000 ug/min | INTRAVENOUS | Status: DC

## 2021-12-23 MED ORDER — SODIUM CHLORIDE 0.9 % IV BOLUS: 500.0000 mL | Freq: Once | INTRAVENOUS | Status: DC

## 2021-12-23 MED ORDER — PROPOFOL 1000 MG/100ML IV EMUL
5.0000 ug/kg/min | INTRAVENOUS | Status: DC
  Administered 2021-12-23: 5 ug/kg/min via INTRAVENOUS
  Administered 2021-12-23: 50 ug/kg/min via INTRAVENOUS
  Administered 2021-12-23: 45 ug/kg/min via INTRAVENOUS
  Administered 2021-12-23: 60 ug/kg/min via INTRAVENOUS
  Administered 2021-12-24 (×2): 40 ug/kg/min via INTRAVENOUS
  Administered 2021-12-24: 35 ug/kg/min via INTRAVENOUS
  Administered 2021-12-24: 40 ug/kg/min via INTRAVENOUS
  Administered 2021-12-24: 45 ug/kg/min via INTRAVENOUS
  Administered 2021-12-24 – 2021-12-25 (×2): 40 ug/kg/min via INTRAVENOUS
  Administered 2021-12-25: 30 ug/kg/min via INTRAVENOUS
  Filled 2021-12-23 (×13): qty 100

## 2021-12-23 MED ORDER — ORAL CARE MOUTH RINSE
15.0000 mL | OROMUCOSAL | Status: DC
  Administered 2021-12-23 – 2021-12-28 (×58): 15 mL via OROMUCOSAL

## 2021-12-23 MED ORDER — ORAL CARE MOUTH RINSE: 15.0000 mL | OROMUCOSAL | Status: DC | PRN

## 2021-12-23 MED ORDER — POLYETHYLENE GLYCOL 3350 17 G PO PACK
17.0000 g | PACK | Freq: Every day | ORAL | Status: DC
  Administered 2021-12-23 – 2021-12-28 (×6): 17 g
  Filled 2021-12-23 (×6): qty 1

## 2021-12-23 MED ORDER — FAMOTIDINE 20 MG PO TABS
20.0000 mg | ORAL_TABLET | Freq: Two times a day (BID) | ORAL | Status: DC
  Administered 2021-12-23 – 2021-12-28 (×10): 20 mg
  Filled 2021-12-23 (×10): qty 1

## 2021-12-23 MED ORDER — NOREPINEPHRINE 4 MG/250ML-% IV SOLN
INTRAVENOUS | Status: AC
  Filled 2021-12-23: qty 250

## 2021-12-23 MED ORDER — VANCOMYCIN HCL 1250 MG/250ML IV SOLN
1250.0000 mg | INTRAVENOUS | Status: DC
  Administered 2021-12-24: 1250 mg via INTRAVENOUS
  Filled 2021-12-23: qty 250

## 2021-12-23 MED ORDER — METHYLPREDNISOLONE SODIUM SUCC 40 MG IJ SOLR
40.0000 mg | Freq: Two times a day (BID) | INTRAMUSCULAR | Status: DC
  Administered 2021-12-23 – 2021-12-24 (×3): 40 mg via INTRAVENOUS
  Filled 2021-12-23 (×3): qty 1

## 2021-12-23 MED ORDER — FENTANYL BOLUS VIA INFUSION
50.0000 ug | INTRAVENOUS | Status: DC | PRN
  Administered 2021-12-25: 50 ug via INTRAVENOUS
  Administered 2021-12-25: 100 ug via INTRAVENOUS
  Administered 2021-12-28: 50 ug via INTRAVENOUS

## 2021-12-23 MED ORDER — FENTANYL CITRATE (PF) 100 MCG/2ML IJ SOLN
100.0000 ug | Freq: Once | INTRAMUSCULAR | Status: AC
  Administered 2021-12-23: 100 ug via INTRAVENOUS
  Filled 2021-12-23: qty 2

## 2021-12-23 MED ORDER — POTASSIUM CHLORIDE 10 MEQ/100ML IV SOLN
10.0000 meq | INTRAVENOUS | Status: AC
  Administered 2021-12-23: 10 meq via INTRAVENOUS
  Filled 2021-12-23 (×2): qty 100

## 2021-12-23 MED ORDER — CHLORHEXIDINE GLUCONATE CLOTH 2 % EX PADS
6.0000 | MEDICATED_PAD | Freq: Every day | CUTANEOUS | Status: DC
  Administered 2021-12-23 – 2021-12-28 (×6): 6 via TOPICAL

## 2021-12-23 MED ORDER — MIDAZOLAM HCL 2 MG/2ML IJ SOLN: 1.0000 mg | INTRAMUSCULAR | Status: DC | PRN

## 2021-12-23 MED ORDER — ADULT MULTIVITAMIN LIQUID CH
15.0000 mL | Freq: Every day | ORAL | Status: DC
  Administered 2021-12-24 – 2021-12-26 (×3): 15 mL
  Filled 2021-12-23 (×3): qty 15

## 2021-12-23 MED ORDER — SODIUM CHLORIDE 0.9 % IV SOLN
100.0000 mg | Freq: Two times a day (BID) | INTRAVENOUS | Status: DC
  Administered 2021-12-23 – 2021-12-25 (×5): 100 mg via INTRAVENOUS
  Filled 2021-12-23 (×5): qty 100

## 2021-12-23 MED ORDER — FENTANYL 2500MCG IN NS 250ML (10MCG/ML) PREMIX INFUSION
0.0000 ug/h | INTRAVENOUS | Status: DC
  Administered 2021-12-24: 125 ug/h via INTRAVENOUS
  Administered 2021-12-25 – 2021-12-26 (×2): 100 ug/h via INTRAVENOUS
  Administered 2021-12-27 (×2): 150 ug/h via INTRAVENOUS
  Administered 2021-12-28: 175 ug/h via INTRAVENOUS
  Filled 2021-12-23 (×7): qty 250

## 2021-12-23 MED ORDER — FENTANYL 2500MCG IN NS 250ML (10MCG/ML) PREMIX INFUSION
INTRAVENOUS | Status: AC
  Administered 2021-12-23: 25 ug/h via INTRAVENOUS
  Filled 2021-12-23: qty 250

## 2021-12-23 MED ORDER — ENOXAPARIN SODIUM 40 MG/0.4ML IJ SOSY
40.0000 mg | PREFILLED_SYRINGE | INTRAMUSCULAR | Status: DC
  Administered 2021-12-23: 40 mg via SUBCUTANEOUS
  Filled 2021-12-23: qty 0.4

## 2021-12-23 MED ORDER — FREE WATER
30.0000 mL | Status: DC
  Administered 2021-12-23 – 2021-12-27 (×18): 30 mL

## 2021-12-23 MED ORDER — PROSOURCE TF20 ENFIT COMPATIBL EN LIQD
60.0000 mL | Freq: Three times a day (TID) | ENTERAL | Status: DC
  Administered 2021-12-24 – 2021-12-26 (×6): 60 mL
  Filled 2021-12-23 (×8): qty 60

## 2021-12-23 MED ORDER — FENTANYL CITRATE (PF) 100 MCG/2ML IJ SOLN: 100.0000 ug | Freq: Once | INTRAMUSCULAR | Status: DC | PRN

## 2021-12-23 MED ORDER — ROCURONIUM BROMIDE 10 MG/ML (PF) SYRINGE
50.0000 mg | PREFILLED_SYRINGE | Freq: Once | INTRAVENOUS | Status: AC
  Administered 2021-12-23: 50 mg via INTRAVENOUS
  Filled 2021-12-23: qty 10

## 2021-12-23 MED ORDER — ROCURONIUM BROMIDE 50 MG/5ML IV SOLN: 50.0000 mg | Freq: Once | INTRAVENOUS | Status: DC | PRN

## 2021-12-23 MED ORDER — DOCUSATE SODIUM 50 MG/5ML PO LIQD
100.0000 mg | Freq: Two times a day (BID) | ORAL | Status: DC
  Administered 2021-12-23 – 2021-12-28 (×10): 100 mg
  Filled 2021-12-23 (×9): qty 10

## 2021-12-23 MED ORDER — ETOMIDATE 2 MG/ML IV SOLN
20.0000 mg | Freq: Once | INTRAVENOUS | Status: AC
  Administered 2021-12-23: 20 mg via INTRAVENOUS
  Filled 2021-12-23: qty 10

## 2021-12-23 MED ORDER — ALBUMIN HUMAN 25 % IV SOLN
25.0000 g | Freq: Once | INTRAVENOUS | Status: AC
  Administered 2021-12-23: 25 g via INTRAVENOUS
  Filled 2021-12-23: qty 100

## 2021-12-23 MED ORDER — SODIUM CHLORIDE 0.9 % IV SOLN
250.0000 mL | INTRAVENOUS | Status: DC
  Administered 2021-12-23: 250 mL via INTRAVENOUS

## 2021-12-23 MED ORDER — SODIUM CHLORIDE 0.9 % IV SOLN
2.0000 g | INTRAVENOUS | Status: DC
  Administered 2021-12-24 – 2021-12-25 (×2): 2 g via INTRAVENOUS
  Filled 2021-12-23 (×2): qty 20

## 2021-12-23 MED ORDER — VITAL HIGH PROTEIN PO LIQD
1000.0000 mL | ORAL | Status: DC
  Administered 2021-12-23 – 2021-12-25 (×3): 1000 mL

## 2021-12-23 MED ORDER — LACTATED RINGERS IV BOLUS
500.0000 mL | Freq: Once | INTRAVENOUS | Status: AC
  Administered 2021-12-23: 500 mL via INTRAVENOUS

## 2021-12-23 NOTE — Progress Notes (Signed)
Initial Nutrition Assessment  DOCUMENTATION CODES:   Obesity unspecified  INTERVENTION:   Vital HP@20ml /hr + ProSource TF 20- Give 37ml TID via tube  Propofol: 32.0 ml/hr- provides 845kcal/day   Free water flushes 44ml q4 hours to maintain tube patency   Regimen provides 1565kcal/day, 102g/day protein and 526ml/day of free water.   Liquid MVI daily via tube   Pt at refeed risk; recommend monitor potassium, magnesium and phosphorus labs daily until stable  NUTRITION DIAGNOSIS:   Inadequate oral intake related to inability to eat (pt sedated and ventilated) as evidenced by NPO status.  GOAL:   Provide needs based on ASPEN/SCCM guidelines  MONITOR:   Vent status, Labs, Weight trends, TF tolerance, Skin, I & O's  REASON FOR ASSESSMENT:   Ventilator    ASSESSMENT:   51 y.o with significant PMH of asthma, tobacco abuse, chronic neck/back pain, headache, anxiety and depression who is admitted with COPD exacerbation and CAP.  Pt sedated and ventilated. OGT in place. Will plan to initiate tube feeds today. Per chart, pt appears weight stable at baseline.   Medications reviewed and include: colace, lovenox, pepcid, solu-medrol, nicotine, miralax, ceftriaxone, levophed, propofol, vancomycin   Labs reviewed: Na 129(L), K 3.3(L), BUN 24(H), alb <1.5(L) Hgb 9.9(L), Hct 29.3(L) Cbgs- 93, 85 x 24 hrs   Patient is currently intubated on ventilator support MV: 12.9 L/min Temp (24hrs), Avg:98.3 F (36.8 C), Min:97.8 F (36.6 C), Max:98.8 F (37.1 C)  Propofol: 32.0 ml/hr- provides 845kcal/day   MAP- >55mmHg   NUTRITION - FOCUSED PHYSICAL EXAM:  Flowsheet Row Most Recent Value  Orbital Region No depletion  Upper Arm Region No depletion  Thoracic and Lumbar Region No depletion  Buccal Region No depletion  Temple Region No depletion  Clavicle Bone Region No depletion  Clavicle and Acromion Bone Region No depletion  Scapular Bone Region No depletion  Dorsal Hand No  depletion  Patellar Region No depletion  Anterior Thigh Region No depletion  Posterior Calf Region No depletion  Edema (RD Assessment) None  Hair Reviewed  Eyes Reviewed  Mouth Reviewed  Skin Reviewed  Nails Reviewed   Diet Order:   Diet Order             Diet NPO time specified  Diet effective now                  EDUCATION NEEDS:   No education needs have been identified at this time  Skin:  Skin Assessment: Reviewed RN Assessment  Last BM:  PTA  Height:   Ht Readings from Last 1 Encounters:  12/23/21 5\' 3"  (1.6 m)    Weight:   Wt Readings from Last 1 Encounters:  12/23/21 88.9 kg    Ideal Body Weight:  52.2 kg  BMI:  Body mass index is 34.72 kg/m.  Estimated Nutritional Needs:   Kcal:  1000-1300kcal/day  Protein:  >105g/day  Fluid:  1.6-1.8L/day  12/25/21 MS, RD, LDN Please refer to [facility] for RD and/or RD on-call/weekend/after hours pager

## 2021-12-23 NOTE — Consult Note (Signed)
NAME:  Janet Hensley, MRN:  [id number], DOB:  08-17-1970, LOS: 1 ADMISSION DATE:  12/22/2021, CONSULTATION DATE:  12/22/2021 REFERRING MD: Florina Ou   CHIEF COMPLAINT:  SOB   HPI  51 y.o with significant PMH of Asthma, tobacco abuse, chronic neck and back pain, headache, anxiety and depression who presented to the ED with chief complaints of progressive shortness of breath.  Patient presented to outside ED on 11/19  with c/o cough, congestion,fever, nausea and vomiting that started 5 days ago. RAPID INFLUENZA/RSV/COVID PCR was negative. She was diagnosed with viral illness and sent home with Zofran and Tessalon. Patient report worsening symptoms over the course of several days with progressive SOB and cough therefore she called EMS. On EMS arrival, was found to be hypoxic at 89% on room air with significant increased work of breathing and wheezing on exam.  Patient received 3 DuoNeb treatments, IV Solu-Medrol and IV magnesium 2 g prior to arrival to the ED.   ED Course: Initial vital signs showed HR of 122 beats/minute, BP 115/24m Hg, the RR 35 breaths/minute, and the oxygen saturation 94 % on 2L [place] and a temperature of 98.65F (36.7C).  Pertinent Labs/Diagnostics Findings: Chemistry:Na+/ K+: 134/2.7  Glucose 110 : BUN/Cr.:33/0.83 Calcium: 7.3  AST/ALT:168/40 CBC: WBC: 6.0 Other Lab findings:   PCT:15.18  Lactic acid: 5.1 COVID PCR: Negative Venous Blood Gas result:  pO2 <31; pCO2 43; pH 7.37;  HCO3 24.9, %O2 Sat 31.7.  Imaging:  CXR>Opacification of the LEFT hemithorax which may represent consolidation, atelectasis and/or pleural effusion. CTH> CTA Chest> Near-complete opacification of the left lung with low-attenuation fluid/infiltrate. This is likely in part due to pneumonia possibly postobstructive in nature given mucus and secretions in multiple segmental bronchi throughout the left lung.  Patient given 30 cc/kg of fluids and started on broad-spectrum antibiotics Ceftriaxone and Doxy  for sepsis secondary to pneumonia. Patient admitted to hospitalist service. Due to progressive short of breath and high risk for decompensation and intubation, PCCM was consulted.  Past Medical History  Asthma, tobacco abuse, chronic neck and back pain, headache, anxiety and depression   Significant Hospital Events Notes  11/26: Admit to ICU with Acute hypoxic respiratory failure secondary to pneumonia 11/27 remains on BiPAP, high risk for intubation  Consults:  PCCM  Procedures:  None  Significant Diagnostic Tests:  11/26: Chest Xray>Opacification of the LEFT hemithorax which may represent consolidation, atelectasis and/or pleural effusion. 11/26: CTA Chest>1. Near-complete opacification of the left lung with low-attenuation fluid/infiltrate. This is likely in part due to pneumonia possibly postobstructive in nature given mucus and secretions in multiple segmental bronchi throughout the left lung. However given the mediastinal adenopathy and nodularity in the right lung underlying malignancy is difficult to exclude. Short interval follow-up after treatment is recommended. 2. Multiple indeterminate pulmonary nodules in the right lung may be infectious/inflammatory however metastases are not excluded. 3. Mediastinal and right hilar adenopathy.  Micro Data:  11/26: SARS-CoV-2 PCR> negative 11/26: Influenza PCR> negative 11/26: Blood culture x2> 11/26: Urine Culture> 11/26: MRSA PCR>>  11/26: Strep pneumo urinary antigen> 11/26: Legionella urinary antigen> 11/26: Mycoplasma pneumonia>  Antimicrobials:  Vancomycin 11/27> Ceftriaxone 11/27>  OBJECTIVE  Blood pressure 123/85, pulse (!) 125, temperature 97.8 F (36.6 C), temperature source Axillary, resp. rate (!) 30, height _0  (1.6 m), weight 88.9 kg, last menstrual period 01/16/2016, SpO2 96 %.        Intake/Output Summary (Last 24 hours) at 12/23/2021 1046 Last data filed at 12/23/2021 0400 Gross  per 24 hour  Intake  --  Output 600 ml  Net -600 ml   Filed Weights   12/22/21 1820 12/23/21 0437  Weight: 86.6 kg 88.9 kg      REVIEW OF SYSTEMS  PATIENT IS UNABLE TO PROVIDE COMPLETE REVIEW OF SYSTEMS DUE TO SEVERE RESP DISTRESS  PHYSICAL EXAMINATION:  GENERAL:critically ill appearing, +resp distress EYES: Pupils equal, round, reactive to light.  No scleral icterus.  MOUTH: Moist mucosal membrane on biPAP NECK: Supple.  PULMONARY: Lungs clear to auscultation, +rhonchi, +wheezing CARDIOVASCULAR: S1 and S2.  Regular rate and rhythm GASTROINTESTINAL: Soft, nontender, -distended. Positive bowel sounds.  MUSCULOSKELETAL: No swelling, clubbing, or edema.  NEUROLOGIC:anxious SKIN:normal, warm to touch, Capillary refill delayed  Pulses present bilaterally          Labs/imaging that I havepersonally reviewed  (right click and "Reselect all SmartList Selections" daily)     Labs   CBC: Recent Labs  Lab 12/22/21 1836 12/23/21 0411  WBC 6.0 5.5  NEUTROABS 5.2  --   HGB 11.3* 9.9*  HCT 32.7* 29.3*  MCV 91.6 93.0  PLT 217 202     Basic Metabolic Panel: Recent Labs  Lab 12/22/21 1836 12/22/21 2019 12/23/21 0411  NA 134*  --  129*  K 2.7*  --  3.3*  CL 98  --  94*  CO2 22  --  20*  GLUCOSE 110*  --  85  BUN 33*  --  24*  CREATININE 0.83  --  0.55  CALCIUM 7.3*  --  7.1*  MG  --  2.4  --     GFR: Estimated Creatinine Clearance: 88 mL/min (by C-G formula based on SCr of 0.55 mg/dL). Recent Labs  Lab 12/22/21 1836 12/22/21 2205 12/23/21 0411  PROCALCITON 15.18  --   --   WBC 6.0  --  5.5  LATICACIDVEN 5.1* 5.6* 2.8*     Liver Function Tests: Recent Labs  Lab 12/22/21 1836 12/23/21 0411  AST 168* 128*  ALT 40 36  ALKPHOS 56 46  BILITOT 1.3* 3.4*  PROT 5.3* 4.9*  ALBUMIN <1.5* <1.5*    No results for input(s): "LIPASE", "AMYLASE" in the last 168 hours. No results for input(s): "AMMONIA" in the last 168 hours.  ABG    Component Value Date/Time   HCO3  24.2 12/23/2021 0411   ACIDBASEDEF 0.7 12/23/2021 0411   O2SAT 91 12/23/2021 0411     Coagulation Profile: No results for input(s): "INR", "PROTIME" in the last 168 hours.  Cardiac Enzymes: Recent Labs  Lab 12/22/21 1836  CKTOTAL 26*     HbA1C: No results found for: "HGBA1C"  CBG: Recent Labs  Lab 12/22/21 2309 12/23/21 0738  GLUCAP 93 85    Allergies No Known Allergies   Home Medications  Prior to Admission medications   Medication Sig Start Date End Date Taking? Authorizing Provider  benzonatate (TESSALON) 100 MG capsule Take 100 mg by mouth every 8 (eight) hours. Take 1 tablet every eight hours for seven days. 12/15/21 12/22/21 Yes [provider]  cyclobenzaprine (FLEXERIL) 10 MG tablet Take 10 mg by mouth as needed (for neck pain). 11/23/17  Yes [provider]  LORazepam (ATIVAN) 0.5 MG tablet Take 0.5-1 mg by mouth at bedtime as needed for sleep. 12/02/21  Yes [provider]  NARCAN 4 MG/0.1ML LIQD nasal spray kit Place 1 spray into the nose once. 11/14/21  Yes [provider]  oxyCODONE-acetaminophen (PERCOCET) 10-325 MG tablet Take 1 tablet by mouth  4 (four) times daily as needed for pain.   Yes [provider]    Scheduled Meds:  budesonide (PULMICORT) nebulizer solution  0.25 mg Nebulization BID   enoxaparin (LOVENOX) injection  40 mg Subcutaneous Q24H   etomidate  20 mg Intravenous Once   fentaNYL (SUBLIMAZE) injection  100 mcg Intravenous Once   ipratropium-albuterol  3 mL Nebulization Q4H   methylPREDNISolone (SOLU-MEDROL) injection  40 mg Intravenous BID   nicotine  21 mg Transdermal Daily   rocuronium bromide  50 mg Intravenous Once   sodium chloride flush  3 mL Intravenous Q12H   Continuous Infusions:  cefTRIAXone (ROCEPHIN)  IV     doxycycline (VIBRAMYCIN) IV 100 mg (12/23/21 0512)   lactated ringers     [START ON 12/24/2021] vancomycin     PRN Meds:.acetaminophen **OR** acetaminophen, etomidate,  fentaNYL (SUBLIMAZE) injection, HYDROcodone-acetaminophen, ipratropium-albuterol, morphine injection, rocuronium    Assessment & Plan:  51 yo active smoker with severe and Acute Hypoxic Respiratory Failure secondary to  CAP With severe COPD exacerbation  Severe ACUTE Hypoxic and Hypercapnic Respiratory Failure High risk for intubation and cardiac arrest On biPAP Patient struggling to breathe   SEVERE COPD EXACERBATION -continue IV steroids as prescribed -continue NEB THERAPY as prescribed -morphine as needed -wean fio2 as needed and tolerated  INFECTIOUS DISEASE -continue antibiotics as prescribed -follow up cultures Severe Sepsis Due to CAP POA   Hypokalemia Hyponatremia -Monitor I&O's / urinary output -Follow BMP -Replace electrolytes as indicated     ENDO - ICU hypoglycemic\Hyperglycemia protocol -check FSBS per protocol   GI GI PROPHYLAXIS as indicated  NUTRITIONAL STATUS DIET-->TF's as tolerated Constipation protocol as indicated   ELECTROLYTES -follow labs as needed -replace as needed -pharmacy consultation and following   Best practice:  Diet:  NPO Pain/Anxiety/Delirium protocol (if indicated): No VAP protocol (if indicated): Not indicated DVT prophylaxis: LMWH GI prophylaxis: N/A Glucose control:  SSI No Central venous access:  N/A Arterial line:  N/A Foley:  N/A Mobility:  bed rest  PT consulted: N/A Last date of multidisciplinary goals of care discussion [12/22/21] Code Status:  full code Disposition: ICU     DVT/GI PRX  assessed I Assessed the need for Labs I Assessed the need for Foley I Assessed the need for Central Venous Line Family Discussion when available I Assessed the need for Mobilization I made an Assessment of medications to be adjusted accordingly Safety Risk assessment completed  CASE DISCUSSED IN MULTIDISCIPLINARY ROUNDS WITH ICU TEAM     Critical Care Time devoted to patient care services described in  this note is 48 minutes.  Critical care was necessary to treat /prevent imminent and life-threatening deterioration. Overall, patient is critically ill, prognosis is guarded.  Patient with Multiorgan failure and at high risk for cardiac arrest and death.    Corrin Parker, M.D.  [facility]  Medical Director [place] Director [facility] Cardio-Pulmonary Department

## 2021-12-23 NOTE — Progress Notes (Signed)
   12/23/21 1500  Clinical Encounter Type  Visited With Patient and family together  Visit Type Initial  Referral From Nurse  Consult/Referral To Chaplain   Chaplain responded to nurse page. Chaplain provided compassionate presence and reflective listening as step daughters spoke about patient's health challenges. Chaplain offered spiritual/emotional support to family members amid stressful events. Chaplain services are available for follow up as needed.

## 2021-12-23 NOTE — Procedures (Signed)
Intubation Procedure Note  Janet Hensley  [id number]  July 18, 1970  Date:12/23/21  Time:12:07 PM   Provider Performing:Carsten Carstarphen D Elvina Sidle    Procedure: Intubation (31500)  Indication(s) Respiratory Failure  Consent Risks of the procedure as well as the alternatives and risks of each were explained to the patient and/or caregiver.  Consent for the procedure was obtained and is signed in the bedside chart   Anesthesia Etomidate, Fentanyl, and Rocuronium   Time Out Verified patient identification, verified procedure, site/side was marked, verified correct patient position, special equipment/implants available, medications/allergies/relevant history reviewed, required imaging and test results available.   Sterile Technique Usual hand hygeine, masks, and gloves were used   Procedure Description Patient positioned in bed supine.  Sedation given as noted above.  Patient was intubated with endotracheal tube using Glidescope.  View was Grade 1 full glottis .  Number of attempts was 1.  Colorimetric CO2 detector was consistent with tracheal placement.   Complications/Tolerance None; patient tolerated the procedure well. Chest X-ray is ordered to verify placement.   EBL Minimal   Specimen(s) None   Size 8.0 ETT Tube secured at the 23 cm mark at lip.   Harlon Ditty, AGACNP-BC [facility] Prefer epic messenger for cross cover needs If after hours, please call E-link

## 2021-12-23 NOTE — Progress Notes (Signed)
Transport to CT without incident

## 2021-12-23 NOTE — Progress Notes (Signed)
An USGPIV (ultrasound guided PIV) 20g L forearm has been placed for short-term vasopressor infusion. A correctly placed ivWatch must be used when administering Vasopressors. Should this treatment be needed beyond 72 hours, central line access should be obtained.  It will be the responsibility of the bedside nurse to follow best practice to prevent extravasations.    

## 2021-12-23 NOTE — IPAL (Signed)
  Interdisciplinary Goals of Care Family Meeting   Date carried out: 12/23/2021  Location of the meeting: Bedside  Member's involved: Physician, Bedside Registered Nurse, and Family Member or next of kin    GOALS OF CARE DISCUSSION  The Clinical status was relayed to family in detail-Husband  Updated and notified of patients medical condition- Patient with increased WOB and using accessory muscles to breathe Explained to family course of therapy and the modalities   Patient with Progressive multiorgan failure with a very high probablity of a very minimal chance of meaningful recovery despite all aggressive and optimal medical therapy.  PATIENT REMAINS FULL CODE  Family understands the situation. Severe LUNG DAMAGE  SEVERE RESP FAILURE NEEDS INTUBATION   Family are satisfied with Plan of action and management. All questions answered  Additional CC time 35 mins   Kristina Mcnorton Santiago Glad, M.D.  [facility]  Medical Director [facility] Medical Director [facility] Cardio-Pulmonary Department

## 2021-12-23 NOTE — Progress Notes (Signed)
Pharmacy Antibiotic Note  Janet Hensley is a 51 y.o. female admitted on 12/22/2021 with pneumonia.  Pharmacy has been consulted for Vancomycin dosing.  Plan: Vancomycin 2 gm IV X 1 ordered for 11/27 @ ~ 0100 (loading dose). Vancomcyin 1250 mg IV Q24H ordered to start on 11/28 @ 0100.   AUC = 485.5 Vanc trough = 10   Height: 5\' 3"  (160 cm) Weight: 86.6 kg (191 lb) IBW/kg (Calculated) : 52.4  Temp (24hrs), Avg:98.4 F (36.9 C), Min:98.1 F (36.7 C), Max:98.6 F (37 C)  Recent Labs  Lab 12/22/21 1836 12/22/21 2205  WBC 6.0  --   CREATININE 0.83  --   LATICACIDVEN 5.1* 5.6*    Estimated Creatinine Clearance: 83.7 mL/min (by C-G formula based on SCr of 0.83 mg/dL).    No Known Allergies  Antimicrobials this admission:   >>    >>   Dose adjustments this admission:   Microbiology results:  BCx:   UCx:    Sputum:    MRSA PCR:   Thank you for allowing pharmacy to be a part of this patient's care.  Kayler Rise D 12/23/2021 12:41 AM

## 2021-12-23 NOTE — Progress Notes (Signed)
Janet Hensley, Notified about Patients increased Heart Rate 144/145.No orders given. Will cont.to monitor.

## 2021-12-23 NOTE — Progress Notes (Signed)
PHARMACY - PHYSICIAN COMMUNICATION CRITICAL VALUE ALERT - BLOOD CULTURE IDENTIFICATION (BCID)  Janet Hensley is an 51 y.o. female who presented to [facility] on 12/22/2021 with a chief complaint of respiratory failure  Assessment:  1/3, aerobic bottle, GPCs (no speciation reported yet)  Name of physician (or Provider) Contacted: Erin Fulling  Current antibiotics: Ceftriaxone, Vancomycin, Doxycycline  Changes to prescribed antibiotics recommended:  Possible contaminant but no antibiotic changes recommended. CCU pharmacist discussed negative MRSA PCR with provider and they wish to continue treatment vancomycin. Pt is critically ill and requiring vasopressors.   Results for orders placed or performed during the hospital encounter of 12/22/21  Blood Culture ID Panel (Reflexed) (Collected: 12/22/2021  6:36 PM)  Result Value Ref Range   Enterococcus faecalis NOT DETECTED NOT DETECTED   Enterococcus Faecium NOT DETECTED NOT DETECTED   Listeria monocytogenes NOT DETECTED NOT DETECTED   Staphylococcus species NOT DETECTED NOT DETECTED   Staphylococcus aureus (BCID) NOT DETECTED NOT DETECTED   Staphylococcus epidermidis NOT DETECTED NOT DETECTED   Staphylococcus lugdunensis NOT DETECTED NOT DETECTED   Streptococcus species NOT DETECTED NOT DETECTED   Streptococcus agalactiae NOT DETECTED NOT DETECTED   Streptococcus pneumoniae NOT DETECTED NOT DETECTED   Streptococcus pyogenes NOT DETECTED NOT DETECTED   A.calcoaceticus-baumannii NOT DETECTED NOT DETECTED   Bacteroides fragilis NOT DETECTED NOT DETECTED   Enterobacterales NOT DETECTED NOT DETECTED   Enterobacter cloacae complex NOT DETECTED NOT DETECTED   Escherichia coli NOT DETECTED NOT DETECTED   Klebsiella aerogenes NOT DETECTED NOT DETECTED   Klebsiella oxytoca NOT DETECTED NOT DETECTED   Klebsiella pneumoniae NOT DETECTED NOT DETECTED   Proteus species NOT DETECTED NOT DETECTED   Salmonella species NOT DETECTED NOT DETECTED    Serratia marcescens NOT DETECTED NOT DETECTED   Haemophilus influenzae NOT DETECTED NOT DETECTED   Neisseria meningitidis NOT DETECTED NOT DETECTED   Pseudomonas aeruginosa NOT DETECTED NOT DETECTED   Stenotrophomonas maltophilia NOT DETECTED NOT DETECTED   Candida albicans NOT DETECTED NOT DETECTED   Candida auris NOT DETECTED NOT DETECTED   Candida glabrata NOT DETECTED NOT DETECTED   Candida krusei NOT DETECTED NOT DETECTED   Candida parapsilosis NOT DETECTED NOT DETECTED   Candida tropicalis NOT DETECTED NOT DETECTED   Cryptococcus neoformans/gattii NOT DETECTED NOT DETECTED   Will M. Dareen Piano, PharmD PGY-1 Pharmacy Resident 12/23/2021 4:59 PM

## 2021-12-23 NOTE — Progress Notes (Signed)
ANTICOAGULATION CONSULT NOTE - Initial Consult  Pharmacy Consult for Lovenox  Indication: VTE prophylaxis  No Known Allergies  Patient Measurements: Height: _0  (160 cm) Weight: 88.9 kg (195 lb 15.8 oz) IBW/kg (Calculated) : 52.4 Heparin Dosing Weight:   Vital Signs: Temp: 98.8 F (37.1 C) (11/27 0400) Temp Source: Axillary (11/27 0400) BP: 105/70 (11/27 0400) Pulse Rate: 118 (11/27 0400)  Labs: Recent Labs    12/22/21 1836 12/22/21 2019 12/23/21 0411  HGB 11.3*  --  9.9*  HCT 32.7*  --  29.3*  PLT 217  --  202  CREATININE 0.83  --  0.55  CKTOTAL 26*  --   --   TROPONINIHS 5 5  --     Estimated Creatinine Clearance: 88 mL/min (by C-G formula based on SCr of 0.55 mg/dL).   Medical History: Past Medical History:  Diagnosis Date   Asthma 01/15/2012    Medications:  Medications Prior to Admission  Medication Sig Dispense Refill Last Dose   [EXPIRED] benzonatate (TESSALON) 100 MG capsule Take 100 mg by mouth every 8 (eight) hours. Take 1 tablet every eight hours for seven days.   unknown at unknown   cyclobenzaprine (FLEXERIL) 10 MG tablet Take 10 mg by mouth as needed (for neck pain).   prn at prn   LORazepam (ATIVAN) 0.5 MG tablet Take 0.5-1 mg by mouth at bedtime as needed for sleep.   prn at prn   NARCAN 4 MG/0.1ML LIQD nasal spray kit Place 1 spray into the nose once.   prn at prn   oxyCODONE-acetaminophen (PERCOCET) 10-325 MG tablet Take 1 tablet by mouth 4 (four) times daily as needed for pain.   prn at prn    Assessment: Pharmacy consulted to dose lovenox in this 51 year female admitted with respiratory distress/sepsis.   Pt was on heparin 5000 units SQ Q8H previously for DVT prophylaxis.  CrCl = 88 ml/min  Goal of Therapy:  Prevention of thromboembolism   Plan:  Heparin 5000 units SQ Q8H ordered, last dose on 11/27 @ 0511. Will d/c heparin 5000 SQ Q8H.  Lovenox 40 mg SQ Q24H ordered to start on 11/27 @ 2200.   Hasaan Radde D 12/23/2021,6:48  AM

## 2021-12-23 NOTE — Progress Notes (Signed)
Dr.Kasa, called to bedside for questioning about intubation. Patient is Tachpnea, Labored breathing on bipap. Husband informed about Risk and Benefits. Husband stated he wanted to wait a couple hours "lunch time" for intubation. No orders given will cont. To assess.

## 2021-12-23 NOTE — Sepsis Progress Note (Signed)
2317 - LA was still elevated at 5.6. Suggested to Dr about giving another bolus and rechecking level. Dr agreed.  0031 - Noticed bolus was DCed by another provider and repeat lactic acid never ordered. Reached out to bedside RN.   0040 - Bedside relayed that extra fluid was contraindicated at this time per current provider.

## 2021-12-24 ENCOUNTER — Inpatient Hospital Stay (HOSPITAL_COMMUNITY)
Admit: 2021-12-24 | Discharge: 2021-12-24 | Disposition: A | Discharge status: Home / Self Care | Payer: 59 | Attending: Internal Medicine | Admitting: Internal Medicine

## 2021-12-24 ENCOUNTER — Encounter
Admission: EM | Disposition: A | Discharge status: Other Acute Inpatient Hospital | Payer: Self-pay | Source: Home / Self Care | Attending: Internal Medicine

## 2021-12-24 ENCOUNTER — Inpatient Hospital Stay
Admit: 2021-12-24 | Discharge: 2021-12-24 | Disposition: A | Discharge status: Home / Self Care | Payer: 59 | Attending: Internal Medicine | Admitting: Internal Medicine

## 2021-12-24 ENCOUNTER — Encounter: Payer: Self-pay | Admitting: Internal Medicine

## 2021-12-24 DIAGNOSIS — I428 Other cardiomyopathies: Secondary | ICD-10-CM

## 2021-12-24 DIAGNOSIS — R000 Tachycardia, unspecified: Secondary | ICD-10-CM

## 2021-12-24 DIAGNOSIS — R9431 Abnormal electrocardiogram [ECG] [EKG]: Secondary | ICD-10-CM

## 2021-12-24 DIAGNOSIS — R0603 Acute respiratory distress: Secondary | ICD-10-CM | POA: Diagnosis not present

## 2021-12-24 DIAGNOSIS — J9601 Acute respiratory failure with hypoxia: Secondary | ICD-10-CM | POA: Diagnosis not present

## 2021-12-24 DIAGNOSIS — I213 ST elevation (STEMI) myocardial infarction of unspecified site: Secondary | ICD-10-CM

## 2021-12-24 HISTORY — PX: LEFT HEART CATH AND CORONARY ANGIOGRAPHY: CATH118249

## 2021-12-24 LAB — GLUCOSE, CAPILLARY
Glucose-Capillary: 142 mg/dL — ABNORMAL HIGH (ref 70–99)
Glucose-Capillary: 152 mg/dL — ABNORMAL HIGH (ref 70–99)
Glucose-Capillary: 155 mg/dL — ABNORMAL HIGH (ref 70–99)
Glucose-Capillary: 159 mg/dL — ABNORMAL HIGH (ref 70–99)
Glucose-Capillary: 169 mg/dL — ABNORMAL HIGH (ref 70–99)
Glucose-Capillary: 172 mg/dL — ABNORMAL HIGH (ref 70–99)

## 2021-12-24 LAB — MAGNESIUM: Magnesium: 2.6 mg/dL — ABNORMAL HIGH (ref 1.7–2.4)

## 2021-12-24 LAB — BASIC METABOLIC PANEL
Anion gap: 8 (ref 5–15)
BUN: 37 mg/dL — ABNORMAL HIGH (ref 6–20)
CO2: 20 mmol/L — ABNORMAL LOW (ref 22–32)
Calcium: 8.5 mg/dL — ABNORMAL LOW (ref 8.9–10.3)
Chloride: 107 mmol/L (ref 98–111)
Creatinine, Ser: 0.66 mg/dL (ref 0.44–1.00)
GFR, Estimated: >60 mL/min (ref >60)
Glucose, Bld: 163 mg/dL — ABNORMAL HIGH (ref 70–99)
Potassium: 3.5 mmol/L (ref 3.5–5.1)
Sodium: 135 mmol/L (ref 135–145)

## 2021-12-24 LAB — URINE CULTURE: Culture: NO GROWTH

## 2021-12-24 LAB — CBC
HCT: 27.5 % — ABNORMAL LOW (ref 36.0–46.0)
Hemoglobin: 9.4 g/dL — ABNORMAL LOW (ref 12.0–15.0)
MCH: 32.1 pg (ref 26.0–34.0)
MCHC: 34.2 g/dL (ref 30.0–36.0)
MCV: 93.9 fL (ref 80.0–100.0)
Platelets: 181 10*3/uL (ref 150–400)
RBC: 2.93 MIL/uL — ABNORMAL LOW (ref 3.87–5.11)
RDW: 14.3 % (ref 11.5–15.5)
WBC: 8.2 10*3/uL (ref 4.0–10.5)
nRBC: 0 % (ref 0.0–0.2)

## 2021-12-24 LAB — LEGIONELLA PNEUMOPHILA SEROGP 1 UR AG: L. pneumophila Serogp 1 Ur Ag: POSITIVE — AB

## 2021-12-24 LAB — TRIGLYCERIDES: Triglycerides: 372 mg/dL — ABNORMAL HIGH (ref <150)

## 2021-12-24 LAB — PHOSPHORUS: Phosphorus: 3.2 mg/dL (ref 2.5–4.6)

## 2021-12-24 LAB — TROPONIN I (HIGH SENSITIVITY)
Troponin I (High Sensitivity): 4 ng/L (ref <18)
Troponin I (High Sensitivity): 9 ng/L (ref <18)

## 2021-12-24 LAB — PROCALCITONIN: Procalcitonin: 19.32 ng/mL

## 2021-12-24 LAB — CULTURE, BLOOD (ROUTINE X 2): Special Requests: ADEQUATE

## 2021-12-24 LAB — MYCOPLASMA PNEUMONIAE ANTIBODY, IGM: Mycoplasma pneumo IgM: <770 U/mL (ref 0–769)

## 2021-12-24 SURGERY — LEFT HEART CATH AND CORONARY ANGIOGRAPHY
Anesthesia: Moderate Sedation

## 2021-12-24 MED ORDER — INSULIN ASPART 100 UNIT/ML IJ SOLN
0.0000 [IU] | INTRAMUSCULAR | Status: DC
  Administered 2021-12-24 (×4): 3 [IU] via SUBCUTANEOUS
  Administered 2021-12-25 – 2021-12-26 (×7): 2 [IU] via SUBCUTANEOUS
  Administered 2021-12-26: 3 [IU] via SUBCUTANEOUS
  Administered 2021-12-26: 2 [IU] via SUBCUTANEOUS
  Administered 2021-12-27: 15 [IU] via SUBCUTANEOUS
  Administered 2021-12-27: 3 [IU] via SUBCUTANEOUS
  Administered 2021-12-27: 5 [IU] via SUBCUTANEOUS
  Administered 2021-12-27: 2 [IU] via SUBCUTANEOUS
  Administered 2021-12-27 (×2): 3 [IU] via SUBCUTANEOUS
  Administered 2021-12-28: 5 [IU] via SUBCUTANEOUS
  Filled 2021-12-24 (×18): qty 1

## 2021-12-24 MED ORDER — HEPARIN SODIUM (PORCINE) 1000 UNIT/ML IJ SOLN
INTRAMUSCULAR | Status: AC
  Filled 2021-12-24: qty 10

## 2021-12-24 MED ORDER — VERAPAMIL HCL 2.5 MG/ML IV SOLN
INTRAVENOUS | Status: DC | PRN
  Administered 2021-12-24: 2.5 mg via INTRA_ARTERIAL

## 2021-12-24 MED ORDER — ENOXAPARIN SODIUM 40 MG/0.4ML IJ SOSY: 40.0000 mg | PREFILLED_SYRINGE | INTRAMUSCULAR | Status: DC

## 2021-12-24 MED ORDER — SODIUM CHLORIDE 0.9% FLUSH: 3.0000 mL | INTRAVENOUS | Status: DC | PRN

## 2021-12-24 MED ORDER — ASPIRIN 81 MG PO CHEW
324.0000 mg | CHEWABLE_TABLET | Freq: Once | ORAL | Status: AC
  Administered 2021-12-24: 324 mg
  Filled 2021-12-24: qty 4

## 2021-12-24 MED ORDER — IOHEXOL 300 MG/ML  SOLN
INTRAMUSCULAR | Status: DC | PRN
  Administered 2021-12-24: 65 mL

## 2021-12-24 MED ORDER — VERAPAMIL HCL 2.5 MG/ML IV SOLN
INTRAVENOUS | Status: AC
  Filled 2021-12-24: qty 2

## 2021-12-24 MED ORDER — HEPARIN SODIUM (PORCINE) 1000 UNIT/ML IJ SOLN
INTRAMUSCULAR | Status: DC | PRN
  Administered 2021-12-24: 4000 [IU] via INTRAVENOUS

## 2021-12-24 MED ORDER — HEPARIN (PORCINE) IN NACL 1000-0.9 UT/500ML-% IV SOLN
INTRAVENOUS | Status: AC
  Filled 2021-12-24: qty 1000

## 2021-12-24 MED ORDER — NITROGLYCERIN 1 MG/10 ML FOR IR/CATH LAB
INTRA_ARTERIAL | Status: AC
  Filled 2021-12-24: qty 10

## 2021-12-24 MED ORDER — HEPARIN (PORCINE) IN NACL 1000-0.9 UT/500ML-% IV SOLN
INTRAVENOUS | Status: DC | PRN
  Administered 2021-12-24 (×2): 500 mL

## 2021-12-24 MED ORDER — HEPARIN BOLUS VIA INFUSION
4000.0000 [IU] | Freq: Once | INTRAVENOUS | Status: DC
  Filled 2021-12-24: qty 4000

## 2021-12-24 MED ORDER — HYDRALAZINE HCL 20 MG/ML IJ SOLN: 10.0000 mg | INTRAMUSCULAR | Status: AC | PRN

## 2021-12-24 MED ORDER — LIDOCAINE HCL (PF) 1 % IJ SOLN
INTRAMUSCULAR | Status: DC | PRN
  Administered 2021-12-24: 2 mL

## 2021-12-24 MED ORDER — SODIUM CHLORIDE 0.9 % IV SOLN: 250.0000 mL | INTRAVENOUS | Status: DC | PRN

## 2021-12-24 MED ORDER — HEPARIN (PORCINE) 25000 UT/250ML-% IV SOLN
850.0000 [IU]/h | INTRAVENOUS | Status: DC
  Filled 2021-12-24: qty 250

## 2021-12-24 MED ORDER — METHYLPREDNISOLONE SODIUM SUCC 40 MG IJ SOLR
20.0000 mg | Freq: Two times a day (BID) | INTRAMUSCULAR | Status: DC
  Administered 2021-12-24 – 2021-12-28 (×8): 20 mg via INTRAVENOUS
  Filled 2021-12-24 (×8): qty 1

## 2021-12-24 MED ORDER — ENOXAPARIN SODIUM 40 MG/0.4ML IJ SOSY
40.0000 mg | PREFILLED_SYRINGE | INTRAMUSCULAR | Status: DC
  Administered 2021-12-24 – 2021-12-27 (×4): 40 mg via SUBCUTANEOUS
  Filled 2021-12-24 (×4): qty 0.4

## 2021-12-24 MED ORDER — LIDOCAINE HCL 1 % IJ SOLN
INTRAMUSCULAR | Status: AC
  Filled 2021-12-24: qty 20

## 2021-12-24 MED ORDER — ASPIRIN 81 MG PO CHEW: 81.0000 mg | CHEWABLE_TABLET | Freq: Every day | ORAL | Status: DC

## 2021-12-24 MED ORDER — SODIUM CHLORIDE 0.9% FLUSH
3.0000 mL | Freq: Two times a day (BID) | INTRAVENOUS | Status: DC
  Administered 2021-12-24 – 2021-12-27 (×7): 3 mL via INTRAVENOUS

## 2021-12-24 SURGICAL SUPPLY — 15 items
BAND CMPR LRG ZPHR (HEMOSTASIS) ×1
BAND ZEPHYR COMPRESS 30 LONG (HEMOSTASIS) IMPLANT
CATH 5F 110X4 TIG (CATHETERS) IMPLANT
CATH INFINITI 5FR ANG PIGTAIL (CATHETERS) IMPLANT
CATH LAUNCHER 6FR JR4 (CATHETERS) IMPLANT
DRAPE BRACHIAL (DRAPES) IMPLANT
GLIDESHEATH SLEND SS 6F .021 (SHEATH) IMPLANT
GUIDEWIRE INQWIRE 1.5J.035X260 (WIRE) IMPLANT
INQWIRE 1.5J .035X260CM (WIRE) ×1
PACK CARDIAC CATH (CUSTOM PROCEDURE TRAY) ×1 IMPLANT
PROTECTION STATION PRESSURIZED (MISCELLANEOUS) ×1
SET ATX SIMPLICITY (MISCELLANEOUS) IMPLANT
SLEEVE REPOSITIONING LENGTH 30 (MISCELLANEOUS) IMPLANT
STATION PROTECTION PRESSURIZED (MISCELLANEOUS) IMPLANT
TUBING CIL FLEX 10 FLL-RA (TUBING) IMPLANT

## 2021-12-24 NOTE — Progress Notes (Signed)
Pt transferred to cath lab by cardiology, RN and RT. MD at bedside has evaluated 12 lead EKG. Pt continues on drips as charted.

## 2021-12-24 NOTE — Consult Note (Signed)
ANTICOAGULATION CONSULT NOTE  Pharmacy Consult for IV Heparin Indication: chest pain/ACS  Patient Measurements: Height: 5\' 3"  (160 cm) Weight: 83.1 kg (183 lb 3.2 oz) IBW/kg (Calculated) : 52.4 Heparin Dosing Weight: 71 kg  Labs: Recent Labs    12/22/21 1836 12/22/21 2019 12/23/21 0411 12/24/21 0503  HGB 11.3*  --  9.9* 9.4*  HCT 32.7*  --  29.3* 27.5*  PLT 217  --  202 181  CREATININE 0.83  --  0.55 0.66  CKTOTAL 26*  --   --   --   TROPONINIHS 5 5  --   --     Estimated Creatinine Clearance: 85 mL/min (by C-G formula based on SCr of 0.66 mg/dL).   Medical History: Past Medical History:  Diagnosis Date   Asthma 01/15/2012    Medications:  No anticoagulation prior to admission per my chart review  Assessment: Patient is a 51 y/o F with medical history including asthma, tobacco use disorder, chronic neck and back pain, headache, anxiety and depression who is admitted with respiratory failure ultimately requiring intubation and mechanical ventilation. EKG changes concerning for myocardial infarction. Pharmacy consulted to initiate and manage heparin infusion for suspected ACS.  Baseline aPTT and PT-INR are pending. Baseline CBC notable for anemia with Hgb 9.4 today.  Goal of Therapy:  Heparin level 0.3-0.7 units/ml Monitor platelets by anticoagulation protocol: Yes   Plan:  --Heparin 4000 unit IV bolus followed by continuous infusion at 850 units/hr --HL 6 hours from initiation --Daily CBC per protocol while on IV heparin  44 12/24/2021,12:45 PM

## 2021-12-24 NOTE — Progress Notes (Signed)
   12/24/21 1600  Clinical Encounter Type  Visited With Family  Visit Type Follow-up   Chaplain provided follow-up care post procedure.

## 2021-12-24 NOTE — Progress Notes (Signed)
NAME:  Janet Hensley, MRN:  [id number], DOB:  [birth year], LOS: 2 ADMISSION DATE:  12/22/2021, CONSULTATION DATE:  12/22/2021 REFERRING MD: Florina Ou   CHIEF COMPLAINT:  SOB   HPI  51 y.o with significant PMH of Asthma, tobacco abuse, chronic neck and back pain, headache, anxiety and depression who presented to the ED with chief complaints of progressive shortness of breath.  Patient presented to outside ED on 11/19  with c/o cough, congestion,fever, nausea and vomiting that started 5 days ago. RAPID INFLUENZA/RSV/COVID PCR was negative. She was diagnosed with viral illness and sent home with Zofran and Tessalon. Patient report worsening symptoms over the course of several days with progressive SOB and cough therefore she called EMS. On EMS arrival, was found to be hypoxic at 89% on room air with significant increased work of breathing and wheezing on exam.  Patient received 3 DuoNeb treatments, IV Solu-Medrol and IV magnesium 2 g prior to arrival to the ED.   ED Course: Initial vital signs showed HR of 122 beats/minute, BP 115/3m Hg, the RR 35 breaths/minute, and the oxygen saturation 94 % on 2L [place] and a temperature of 98.61F (36.7C).  Pertinent Labs/Diagnostics Findings: Chemistry:Na+/ K+: 134/2.7  Glucose 110 : BUN/Cr.:33/0.83 Calcium: 7.3  AST/ALT:168/40 CBC: WBC: 6.0 Other Lab findings:   PCT:15.18  Lactic acid: 5.1 COVID PCR: Negative Venous Blood Gas result:  pO2 <31; pCO2 43; pH 7.37;  HCO3 24.9, %O2 Sat 31.7.  Imaging:  CXR>Opacification of the LEFT hemithorax which may represent consolidation, atelectasis and/or pleural effusion. CTH> CTA Chest> Near-complete opacification of the left lung with low-attenuation fluid/infiltrate. This is likely in part due to pneumonia possibly postobstructive in nature given mucus and secretions in multiple segmental bronchi throughout the left lung.  Patient given 30 cc/kg of fluids and started on broad-spectrum antibiotics Ceftriaxone and Doxy  for sepsis secondary to pneumonia. Patient admitted to hospitalist service. Due to progressive short of breath and high risk for decompensation and intubation, PCCM was consulted.  Past Medical History  Asthma, tobacco abuse, chronic neck and back pain, headache, anxiety and depression   Significant Hospital Events Notes  11/26: Admit to ICU with Acute hypoxic respiratory failure secondary to pneumonia 11/27 remains on BiPAP, high risk for intubation 11/27 patient intubated,family updated multiple times   Consults:  PCCM  Procedures:  None  Significant Diagnostic Tests:  11/26: Chest Xray>Opacification of the LEFT hemithorax which may represent consolidation, atelectasis and/or pleural effusion. 11/26: CTA Chest>1. Near-complete opacification of the left lung with low-attenuation fluid/infiltrate. This is likely in part due to pneumonia possibly postobstructive in nature given mucus and secretions in multiple segmental bronchi throughout the left lung. However given the mediastinal adenopathy and nodularity in the right lung underlying malignancy is difficult to exclude. Short interval follow-up after treatment is recommended. 2. Multiple indeterminate pulmonary nodules in the right lung may be infectious/inflammatory however metastases are not excluded. 3. Mediastinal and right hilar adenopathy.  Micro Data:  11/26: SARS-CoV-2 PCR> negative 11/26: Influenza PCR> negative 11/26: Blood culture x2> 11/26: Urine Culture> 11/26: MRSA PCR>>  11/26: Strep pneumo urinary antigen> 11/26: Legionella urinary antigen> 11/26: Mycoplasma pneumonia>  Antimicrobials:  Vancomycin 11/27> Ceftriaxone 11/27>  OBJECTIVE  Blood pressure (!) 104/59, pulse 95, temperature 100 F (37.8 C), temperature source Axillary, resp. rate (!) 28, height _0  (1.6 m), weight 83.1 kg, last menstrual period 01/16/2016, SpO2 92 %.    Vent Mode: PRVC FiO2 (%):  [50 %-60 %] 50 % Set  Rate:  [20 bmp] 20  bmp Vt Set:  [450 mL] 450 mL PEEP:  [5 cmH20] 5 cmH20 Plateau Pressure:  [19 cmH20] 19 cmH20   Intake/Output Summary (Last 24 hours) at 12/24/2021 0747 Last data filed at 12/24/2021 0700 Gross per 24 hour  Intake 2666.79 ml  Output 1576 ml  Net 1090.79 ml    Filed Weights   12/22/21 1820 12/23/21 0437 12/24/21 0600  Weight: 86.6 kg 88.9 kg 83.1 kg   EVENTS OVERNIGHT SEVERE HYPOXIA REMAINS CRITICALLY ILL PLAN FOR BRONC TOMORROW    REVIEW OF SYSTEMS  PATIENT IS UNABLE TO PROVIDE COMPLETE REVIEW OF SYSTEMS DUE TO SEVERE CRITICAL ILLNESS   PHYSICAL EXAMINATION:  GENERAL:critically ill appearing, +resp distress EYES: Pupils equal, round, reactive to light.  No scleral icterus.  MOUTH: Moist mucosal membrane. INTUBATED NECK: Supple.  PULMONARY: Lungs clear to auscultation, +rhonchi, +wheezing CARDIOVASCULAR: S1 and S2.  Regular rate and rhythm GASTROINTESTINAL: Soft, nontender, -distended. Positive bowel sounds.  MUSCULOSKELETAL: No swelling, clubbing, or edema.  NEUROLOGIC: obtunded,sedated SKIN:normal, warm to touch, Capillary refill delayed  Pulses present bilaterally          Labs/imaging that I havepersonally reviewed  (right click and "Reselect all SmartList Selections" daily)     Labs   CBC: Recent Labs  Lab 12/22/21 1836 12/23/21 0411 12/24/21 0503  WBC 6.0 5.5 8.2  NEUTROABS 5.2  --   --   HGB 11.3* 9.9* 9.4*  HCT 32.7* 29.3* 27.5*  MCV 91.6 93.0 93.9  PLT 217 202 181     Basic Metabolic Panel: Recent Labs  Lab 12/22/21 1836 12/22/21 2019 12/23/21 0411 12/24/21 0503  NA 134*  --  129* 135  K 2.7*  --  3.3* 3.5  CL 98  --  94* 107  CO2 22  --  20* 20*  GLUCOSE 110*  --  85 163*  BUN 33*  --  24* 37*  CREATININE 0.83  --  0.55 0.66  CALCIUM 7.3*  --  7.1* 8.5*  MG  --  2.4  --  2.6*  PHOS  --   --   --  3.2    GFR: Estimated Creatinine Clearance: 85 mL/min (by C-G formula based on SCr of 0.66 mg/dL). Recent Labs  Lab  12/22/21 1836 12/22/21 2205 12/23/21 0411 12/24/21 0503  PROCALCITON 15.18  --   --  19.32  WBC 6.0  --  5.5 8.2  LATICACIDVEN 5.1* 5.6* 2.8*  --      Liver Function Tests: Recent Labs  Lab 12/22/21 1836 12/23/21 0411  AST 168* 128*  ALT 40 36  ALKPHOS 56 46  BILITOT 1.3* 3.4*  PROT 5.3* 4.9*  ALBUMIN <1.5* <1.5*    No results for input(s): "LIPASE", "AMYLASE" in the last 168 hours. No results for input(s): "AMMONIA" in the last 168 hours.  ABG    Component Value Date/Time   PHART 7.36 12/23/2021 1332   PCO2ART 40 12/23/2021 1332   PO2ART 75 (L) 12/23/2021 1332   HCO3 22.6 12/23/2021 1332   ACIDBASEDEF 2.7 (H) 12/23/2021 1332   O2SAT 96.7 12/23/2021 1332     Coagulation Profile: No results for input(s): "INR", "PROTIME" in the last 168 hours.  Cardiac Enzymes: Recent Labs  Lab 12/22/21 1836  CKTOTAL 26*     HbA1C: No results found for: "HGBA1C"  CBG: Recent Labs  Lab 12/23/21 0738 12/23/21 1109 12/23/21 1905 12/24/21 0337 12/24/21 0736  GLUCAP 85 93 113* 142* 155*  Allergies No Known Allergies   Home Medications  Prior to Admission medications   Medication Sig Start Date End Date Taking? Authorizing Provider  benzonatate (TESSALON) 100 MG capsule Take 100 mg by mouth every 8 (eight) hours. Take 1 tablet every eight hours for seven days. 12/15/21 12/22/21 Yes [provider]  cyclobenzaprine (FLEXERIL) 10 MG tablet Take 10 mg by mouth as needed (for neck pain). 11/23/17  Yes [provider]  LORazepam (ATIVAN) 0.5 MG tablet Take 0.5-1 mg by mouth at bedtime as needed for sleep. 12/02/21  Yes [provider]  NARCAN 4 MG/0.1ML LIQD nasal spray kit Place 1 spray into the nose once. 11/14/21  Yes [provider]  oxyCODONE-acetaminophen (PERCOCET) 10-325 MG tablet Take 1 tablet by mouth 4 (four) times daily as needed for pain.   Yes [provider]    Scheduled Meds:  budesonide (PULMICORT)  nebulizer solution  0.25 mg Nebulization BID   Chlorhexidine Gluconate Cloth  6 each Topical Daily   docusate  100 mg Per Tube BID   enoxaparin (LOVENOX) injection  40 mg Subcutaneous Q24H   famotidine  20 mg Per Tube BID   feeding supplement (PROSource TF20)  60 mL Per Tube TID   feeding supplement (VITAL HIGH PROTEIN)  1,000 mL Per Tube Q24H   free water  30 mL Per Tube Q4H   ipratropium-albuterol  3 mL Nebulization Q4H   methylPREDNISolone (SOLU-MEDROL) injection  40 mg Intravenous BID   multivitamin  15 mL Per Tube Daily   nicotine  21 mg Transdermal Daily   mouth rinse  15 mL Mouth Rinse Q2H   polyethylene glycol  17 g Per Tube Daily   sodium chloride flush  3 mL Intravenous Q12H   Continuous Infusions:  sodium chloride Stopped (12/24/21 0009)   cefTRIAXone (ROCEPHIN)  IV     doxycycline (VIBRAMYCIN) IV 125 mL/hr at 12/24/21 0700   fentaNYL infusion INTRAVENOUS 125 mcg/hr (12/24/21 0700)   norepinephrine (LEVOPHED) Adult infusion 3 mcg/min (12/24/21 0700)   propofol (DIPRIVAN) infusion 40 mcg/kg/min (12/24/21 0700)   sodium chloride     vancomycin Stopped (12/24/21 0139)   PRN Meds:.acetaminophen **OR** acetaminophen, fentaNYL, ipratropium-albuterol, midazolam, mouth rinse    Assessment & Plan:  51 yo active smoker with severe and Acute Hypoxic Respiratory Failure secondary to  CAP With severe COPD exacerbation leading to severe and acute respiratory  failure  Severe ACUTE Hypoxic and Hypercapnic Respiratory Failure -continue Mechanical Ventilator support -Wean Fio2 and PEEP as tolerated -VAP/VENT bundle implementation - Wean PEEP & FiO2 as tolerated, maintain SpO2 > 88% - Head of bed elevated 30 degrees, VAP protocol in place - Plateau pressures less than 30 cm H20  - Intermittent chest x-ray & ABG PRN - Ensure adequate pulmonary hygiene  Plan for bronch tomorrow to assess for lung cancer  SEVERE COPD EXACERBATION -continue IV steroids as prescribed -continue NEB  THERAPY as prescribed -morphine as needed -wean fio2 as needed and tolerated   INFECTIOUS DISEASE -continue antibiotics as prescribed -follow up cultures Severe Sepsis Due to CAP POA   Hypokalemia Hyponatremia -Monitor I&O's / urinary output -Follow BMP -Replace electrolytes as indicated     ENDO - ICU hypoglycemic\Hyperglycemia protocol -check FSBS per protocol   GI GI PROPHYLAXIS as indicated  NUTRITIONAL STATUS DIET-->TF's as tolerated Constipation protocol as indicated   ELECTROLYTES -follow labs as needed -replace as needed -pharmacy consultation and following   NEUROLOGY -need for sedation -Goal RASS -2 to -3  Best practice:  Diet:  NPO Pain/Anxiety/Delirium protocol (if indicated): No VAP protocol (if indicated): Not indicated DVT prophylaxis: LMWH GI prophylaxis: N/A Glucose control:  SSI No Central venous access:  N/A Arterial line:  N/A Foley:  N/A Mobility:  bed rest  PT consulted: N/A Last date of multidisciplinary goals of care discussion [12/22/21] Code Status:  full code Disposition: ICU      DVT/GI PRX  assessed I Assessed the need for Labs I Assessed the need for Foley I Assessed the need for Central Venous Line Family Discussion when available I Assessed the need for Mobilization I made an Assessment of medications to be adjusted accordingly Safety Risk assessment completed  CASE DISCUSSED IN MULTIDISCIPLINARY ROUNDS WITH ICU TEAM     Critical Care Time devoted to patient care services described in this note is 45 minutes.  Critical care was necessary to treat /prevent imminent and life-threatening deterioration. Overall, patient is critically ill, prognosis is guarded.   Corrin Parker, M.D.  [facility]  Medical Director [place] Director [facility] Cardio-Pulmonary Department

## 2021-12-24 NOTE — Progress Notes (Signed)
No wake up assessment done, MD wants pt to rest today

## 2021-12-24 NOTE — Progress Notes (Signed)
   12/24/21 1300  Clinical Encounter Type  Visited With Family  Visit Type Initial  Referral From Physician  Consult/Referral To Chaplain  Spiritual Encounters  Spiritual Needs Grief support   Chaplain responded to Code STEMI and provided support to patient's husband through compassionate presence as his wife was taken to Cath Lab.

## 2021-12-24 NOTE — Therapy (Signed)
Pt transported to Cath Lab on trilogy with settings:AC; RR-21; VT-480;60%FiO2.  Pt ventilated through out procedure with no adverse reactions.

## 2021-12-24 NOTE — Consult Note (Signed)
Cardiology Consultation:   Patient ID: Janet Hensley; [id number]; 1970-12-29   Admit date: 12/22/2021 Date of Consult: 12/24/2021  Primary Care Provider: Rutherford Limerick, PA Primary Cardiologist: new - consult by End Primary Electrophysiologist:  None   Patient Profile:   Janet Hensley is a 51 y.o. female with a hx of asthma, low back pain with history of compression fracture, trigeminal neuralgia, and COPD with tobacco use who is admitted for acute hypoxic and hypercapnic respiratory failure in the setting of possible postobstructive community-acquired pneumonia and severe COPD exacerbation is being seen today for the evaluation of inferior ST elevation MI at the request of Dr. Mortimer Fries.  History of Present Illness:   Janet Hensley has no previously known cardiac history.  She was admitted to [facility] on 12/22/2021 with acute hypoxic and hypercapnic respiratory failure requiring mechanical ventilation secondary to possible postobstructive community-acquired pneumonia with severe COPD exacerbation with imaging findings concerning for possible underlying malignancy with admission complicated by inferior ST elevation MI on 12/24/2021.  For approximately 10 days prior to admission, she was feeling quite poorly with poor oral intake.  CTA of the chest upon admission showed near complete opacification of the left lung with low attenuation fluid/infiltrate felt to likely be in part due to pneumonia that was possibly postobstructive with findings concerning for possible underlying malignancy as well as multiple indeterminate pulmonary nodules and mediastinal/right hilar adenopathy.  Initial high-sensitivity troponin negative x 2 upon admission and remained negative this afternoon upon ST elevation being noted on bedside cardiac monitor.  EKG demonstrated sinus tachycardia, 102 bpm, inferior ST elevation with reciprocal anterior changes.  She remains intubated, sedated, and on vasopressor support.  Husband at  bedside.   Past Medical History:  Diagnosis Date   Asthma 01/15/2012    History reviewed. No pertinent surgical history.   Home Meds: Prior to Admission medications   Medication Sig Start Date End Date Taking? Authorizing Provider  cyclobenzaprine (FLEXERIL) 10 MG tablet Take 10 mg by mouth as needed (for neck pain). 11/23/17  Yes [provider]  LORazepam (ATIVAN) 0.5 MG tablet Take 0.5-1 mg by mouth at bedtime as needed for sleep. 12/02/21  Yes [provider]  NARCAN 4 MG/0.1ML LIQD nasal spray kit Place 1 spray into the nose once. 11/14/21  Yes [provider]  oxyCODONE-acetaminophen (PERCOCET) 10-325 MG tablet Take 1 tablet by mouth 4 (four) times daily as needed for pain.   Yes [provider]    Inpatient Medications: Scheduled Meds:  [START ON 12/25/2021] aspirin  81 mg Oral Daily   budesonide (PULMICORT) nebulizer solution  0.25 mg Nebulization BID   Chlorhexidine Gluconate Cloth  6 each Topical Daily   docusate  100 mg Per Tube BID   famotidine  20 mg Per Tube BID   feeding supplement (PROSource TF20)  60 mL Per Tube TID   feeding supplement (VITAL HIGH PROTEIN)  1,000 mL Per Tube Q24H   free water  30 mL Per Tube Q4H   heparin  4,000 Units Intravenous Once   insulin aspart  0-15 Units Subcutaneous Q4H   ipratropium-albuterol  3 mL Nebulization Q4H   methylPREDNISolone (SOLU-MEDROL) injection  20 mg Intravenous BID   multivitamin  15 mL Per Tube Daily   nicotine  21 mg Transdermal Daily   mouth rinse  15 mL Mouth Rinse Q2H   polyethylene glycol  17 g Per Tube Daily   sodium chloride flush  3 mL Intravenous Q12H   Continuous  Infusions:  sodium chloride Stopped (12/24/21 0009)   cefTRIAXone (ROCEPHIN)  IV Stopped (12/24/21 0931)   doxycycline (VIBRAMYCIN) IV Stopped (12/24/21 0758)   fentaNYL infusion INTRAVENOUS 125 mcg/hr (12/24/21 1100)   heparin     norepinephrine (LEVOPHED) Adult infusion 1 mcg/min (12/24/21 1100)    propofol (DIPRIVAN) infusion 40 mcg/kg/min (12/24/21 1100)   PRN Meds: acetaminophen **OR** acetaminophen, fentaNYL, ipratropium-albuterol, midazolam, mouth rinse  Allergies:  No Known Allergies  Social History:   Social History   Socioeconomic History   Marital status: Legally Separated    Spouse name: Not on file   Number of children: Not on file   Years of education: Not on file   Highest education level: Not on file  Occupational History   Not on file  Tobacco Use   Smoking status: Not on file   Smokeless tobacco: Not on file  Substance and Sexual Activity   Alcohol use: Not on file   Drug use: Not on file   Sexual activity: Not on file  Other Topics Concern   Not on file  Social History Narrative   Not on file   Social Determinants of Health   Financial Resource Strain: Not on file  Food Insecurity: Not on file  Transportation Needs: Not on file  Physical Activity: Not on file  Stress: Not on file  Social Connections: Not on file  Intimate Partner Violence: Not on file     Family History:   History reviewed. No pertinent family history. Unable to obtain due to mechanical ventilation   ROS:  Review of Systems  Unable to perform ROS: Intubated      Physical Exam/Data:   Vitals:   12/24/21 1129 12/24/21 1130 12/24/21 1145 12/24/21 1200  BP:  (!) 92/50 (!) 93/47 (!) 96/52  Pulse:  (!) 108 (!) 105   Resp:  (!) 32 (!) 30   Temp:      TempSrc:      SpO2: 93% 93% 93%   Weight:      Height:        Intake/Output Summary (Last 24 hours) at 12/24/2021 1306 Last data filed at 12/24/2021 1100 Gross per 24 hour  Intake 2889.37 ml  Output 1575 ml  Net 1314.37 ml   Filed Weights   12/22/21 1820 12/23/21 0437 12/24/21 0600  Weight: 86.6 kg 88.9 kg 83.1 kg   Body mass index is 32.45 kg/m.   Physical Exam: General: Well developed, well nourished, in no acute distress.  Ill-appearing. Head: Normocephalic, atraumatic, sclera non-icteric, no xanthomas,  nares without discharge.  Neck: Negative for carotid bruits. JVD unable to be assessed secondary to respiratory support apparatus. Lungs: Diminished and vented breath sounds bilaterally. Heart: RRR with S1 S2. No murmurs, rubs, or gallops appreciated. Abdomen: Soft, non-tender, non-distended with normoactive bowel sounds. No hepatomegaly. No rebound/guarding. No obvious abdominal masses. Msk:  Strength and tone appear normal for age. Extremities: No clubbing or cyanosis. No edema. Distal pedal pulses are 2+ and equal bilaterally. Neuro: Intubated and sedated. Psych:  Intubated and sedated.   EKG:  The EKG was personally reviewed and demonstrates: sinus tachycardia, 102 bpm, inferior ST elevation with reciprocal anterior changes Telemetry:  Telemetry was personally reviewed and demonstrates: SR  Weights: Filed Weights   12/22/21 1820 12/23/21 0437 12/24/21 0600  Weight: 86.6 kg 88.9 kg 83.1 kg    Relevant CV Studies:  None available for review.   Laboratory Data:  Chemistry Recent Labs  Lab 12/22/21 1836 12/23/21 0411 12/24/21  0503  NA 134* 129* 135  K 2.7* 3.3* 3.5  CL 98 94* 107  CO2 22 20* 20*  GLUCOSE 110* 85 163*  BUN 33* 24* 37*  CREATININE 0.83 0.55 0.66  CALCIUM 7.3* 7.1* 8.5*  GFRNONAA >60 >60 >60  ANIONGAP _0 Recent Labs  Lab 12/22/21 1836 12/23/21 0411  PROT 5.3* 4.9*  ALBUMIN <1.5* <1.5*  AST 168* 128*  ALT 40 36  ALKPHOS 56 46  BILITOT 1.3* 3.4*   Hematology Recent Labs  Lab 12/22/21 1836 12/23/21 0411 12/24/21 0503  WBC 6.0 5.5 8.2  RBC 3.57* 3.15* 2.93*  HGB 11.3* 9.9* 9.4*  HCT 32.7* 29.3* 27.5*  MCV 91.6 93.0 93.9  MCH 31.7 31.4 32.1  MCHC 34.6 33.8 34.2  RDW 13.8 14.2 14.3  PLT 217 202 181   Cardiac EnzymesNo results for input(s): "TROPONINI" in the last 168 hours. No results for input(s): "TROPIPOC" in the last 168 hours.  BNP Recent Labs  Lab 12/22/21 1836  BNP 15.2    DDimer No results for input(s): "DDIMER" in  the last 168 hours.  Radiology/Studies:  CT HEAD WO CONTRAST (5MM)  Result Date: 12/23/2021 IMPRESSION: No acute intracranial abnormality noted. Electronically Signed   By: Inez Catalina M.D.   On: 12/23/2021 23:02   DG Abd 1 View  Result Date: 12/23/2021 IMPRESSION: Orogastric tube tip and side port overlie the stomach. Electronically Signed   By: Maurine Simmering M.D.   On: 12/23/2021 12:35   DG Chest Port 1 View  Result Date: 12/23/2021 IMPRESSION: Endotracheal tube overlies the trachea approximately 2.9 cm above the carina. Unchanged complete opacification of the left hemithorax and patchy airspace disease in the right lung. Electronically Signed   By: Maurine Simmering M.D.   On: 12/23/2021 12:34   CT Angio Chest PE W/Cm &/Or Wo Cm  Result Date: 12/22/2021 IMPRESSION: 1. Near-complete opacification of the left lung with low-attenuation fluid/infiltrate. This is likely in part due to pneumonia possibly postobstructive in nature given mucus and secretions in multiple segmental bronchi throughout the left lung. However given the mediastinal adenopathy and nodularity in the right lung underlying malignancy is difficult to exclude. Short interval follow-up after treatment is recommended. 2. Multiple indeterminate pulmonary nodules in the right lung may be infectious/inflammatory however metastases are not excluded. 3. Mediastinal and right hilar adenopathy. Electronically Signed   By: Placido Sou M.D.   On: 12/22/2021 20:35   DG Chest Port 1 View  Result Date: 12/22/2021 IMPRESSION: Opacification of the LEFT hemithorax which may represent consolidation, atelectasis and/or pleural effusion. Electronically Signed   By: Margarette Canada M.D.   On: 12/22/2021 19:06    Assessment and Plan:   1.  Inferior ST elevation MI: -Emergent LHC -ASA -Echo pending -Trend troponin to peak -No obvious significant pericardial effusion on CT of the chest this admission  2.  Acute hypoxic and hypercapnic  respiratory failure requiring mechanical ventilation: -In the setting of presumed postobstructive lobar pneumonia and severe COPD exacerbation -Also likely now exacerbated by inferior ST elevation MI -Vent management and antibiotic therapy per critical care -Further recommendations regarding evaluation of potential malignancy will be determined based on cardiac cath findings at the discretion of pulmonology  3.  Hypokalemia: -Improving  4.  Anemia: -No obvious source of bleed -Monitor-going management per primary service  5.  Hypoalbuminemia: -Albumin less than 1.5 -IV repletion     Shared Decision Making/Informed Consent{  The risks [stroke (1 in  1000), death (1 in 19), kidney failure [usually temporary] (1 in 500), bleeding (1 in 200), allergic reaction [possibly serious] (1 in 200)], benefits (diagnostic support and management of coronary artery disease) and alternatives of a cardiac catheterization were discussed in detail with Ms. Ratcliffe's husband and he is willing to proceed.    For questions or updates, please contact [facility] Please consult [email or web address] for contact info under Cardiology/STEMI.   Signed, Christell Faith, PA-C [facility] Pager: [phone] 12/24/2021, 1:06 PM

## 2021-12-24 NOTE — Progress Notes (Signed)
BRIEF PCCM NOTE  Pt's continuous telemetry monitoring concerning for ST elevation.  Pt is currently intubated and sedated, unable to perform examination regarding chest pain.  12-Lead EKG obtained and is concerning for inferior STEMI.  Called and discussed with Dr. Okey Dupre of Cardiology who reviewed EKG.  He requests that we call CODE STEMI.  He will be at bedside shortly and plans to proceed with emergent CATH.  In the interim will give ASA and start Heparin gtt for ACS. Troponin and Echocardiogram are currently pending     Pt's husband updated at bedside of acute event findings.  Additional Critical Care Time: 25 minutes  Harlon Ditty, AGACNP-BC [facility] Prefer epic messenger for cross cover needs If after hours, please call E-link

## 2021-12-25 ENCOUNTER — Inpatient Hospital Stay: Payer: 59

## 2021-12-25 DIAGNOSIS — I2510 Atherosclerotic heart disease of native coronary artery without angina pectoris: Secondary | ICD-10-CM | POA: Diagnosis not present

## 2021-12-25 DIAGNOSIS — R0603 Acute respiratory distress: Secondary | ICD-10-CM | POA: Diagnosis not present

## 2021-12-25 DIAGNOSIS — R599 Enlarged lymph nodes, unspecified: Secondary | ICD-10-CM | POA: Diagnosis not present

## 2021-12-25 DIAGNOSIS — R9431 Abnormal electrocardiogram [ECG] [EKG]: Secondary | ICD-10-CM | POA: Diagnosis not present

## 2021-12-25 LAB — CBC
HCT: 27.2 % — ABNORMAL LOW (ref 36.0–46.0)
Hemoglobin: 9 g/dL — ABNORMAL LOW (ref 12.0–15.0)
MCH: 30.8 pg (ref 26.0–34.0)
MCHC: 33.1 g/dL (ref 30.0–36.0)
MCV: 93.2 fL (ref 80.0–100.0)
Platelets: 165 10*3/uL (ref 150–400)
RBC: 2.92 MIL/uL — ABNORMAL LOW (ref 3.87–5.11)
RDW: 14.9 % (ref 11.5–15.5)
WBC: 12.5 10*3/uL — ABNORMAL HIGH (ref 4.0–10.5)
nRBC: 0.4 % — ABNORMAL HIGH (ref 0.0–0.2)

## 2021-12-25 LAB — PROCALCITONIN: Procalcitonin: 13.4 ng/mL

## 2021-12-25 LAB — ECHOCARDIOGRAM COMPLETE
AR max vel: 2.62 cm2
AV Area VTI: 2.9 cm2
AV Area mean vel: 2.37 cm2
AV Mean grad: 6.1 mmHg
AV Peak grad: 11.1 mmHg
Ao pk vel: 1.67 m/s
Area-P 1/2: 4.15 cm2
Height: 63 in
S' Lateral: 3.1 cm
Weight: 2931.24 oz

## 2021-12-25 LAB — GLUCOSE, CAPILLARY
Glucose-Capillary: 120 mg/dL — ABNORMAL HIGH (ref 70–99)
Glucose-Capillary: 121 mg/dL — ABNORMAL HIGH (ref 70–99)
Glucose-Capillary: 127 mg/dL — ABNORMAL HIGH (ref 70–99)
Glucose-Capillary: 129 mg/dL — ABNORMAL HIGH (ref 70–99)
Glucose-Capillary: 140 mg/dL — ABNORMAL HIGH (ref 70–99)
Glucose-Capillary: 141 mg/dL — ABNORMAL HIGH (ref 70–99)

## 2021-12-25 LAB — MAGNESIUM: Magnesium: 2.8 mg/dL — ABNORMAL HIGH (ref 1.7–2.4)

## 2021-12-25 LAB — BASIC METABOLIC PANEL
Anion gap: 7 (ref 5–15)
BUN: 47 mg/dL — ABNORMAL HIGH (ref 6–20)
CO2: 19 mmol/L — ABNORMAL LOW (ref 22–32)
Calcium: 9.2 mg/dL (ref 8.9–10.3)
Chloride: 111 mmol/L (ref 98–111)
Creatinine, Ser: 0.79 mg/dL (ref 0.44–1.00)
GFR, Estimated: >60 mL/min (ref >60)
Glucose, Bld: 137 mg/dL — ABNORMAL HIGH (ref 70–99)
Potassium: 3.8 mmol/L (ref 3.5–5.1)
Sodium: 137 mmol/L (ref 135–145)

## 2021-12-25 LAB — PHOSPHORUS: Phosphorus: 3.8 mg/dL (ref 2.5–4.6)

## 2021-12-25 LAB — TRIGLYCERIDES: Triglycerides: 379 mg/dL — ABNORMAL HIGH (ref <150)

## 2021-12-25 MED ORDER — MIDAZOLAM-SODIUM CHLORIDE 100-0.9 MG/100ML-% IV SOLN
0.5000 mg/h | INTRAVENOUS | Status: DC
  Administered 2021-12-25: 2 mg/h via INTRAVENOUS
  Administered 2021-12-25 – 2021-12-26 (×2): 10 mg/h via INTRAVENOUS
  Filled 2021-12-25 (×3): qty 100

## 2021-12-25 MED ORDER — MIDAZOLAM HCL 2 MG/2ML IJ SOLN
4.0000 mg | Freq: Once | INTRAMUSCULAR | Status: AC
  Administered 2021-12-25: 4 mg via INTRAVENOUS
  Filled 2021-12-25: qty 4

## 2021-12-25 MED ORDER — STERILE WATER FOR INJECTION IJ SOLN
INTRAMUSCULAR | Status: AC
  Administered 2021-12-25: 10 mL
  Filled 2021-12-25: qty 10

## 2021-12-25 MED ORDER — SODIUM CHLORIDE 0.9 % IV SOLN
500.0000 mg | Freq: Every day | INTRAVENOUS | Status: AC
  Administered 2021-12-25 – 2021-12-26 (×2): 500 mg via INTRAVENOUS
  Filled 2021-12-25: qty 5
  Filled 2021-12-25: qty 500

## 2021-12-25 MED ORDER — MIDAZOLAM HCL 2 MG/2ML IJ SOLN
2.0000 mg | INTRAMUSCULAR | Status: DC | PRN
  Administered 2021-12-27: 4 mg via INTRAVENOUS
  Filled 2021-12-25: qty 4

## 2021-12-25 MED ORDER — VECURONIUM BROMIDE 10 MG IV SOLR
20.0000 mg | INTRAVENOUS | Status: DC | PRN
  Administered 2021-12-26 – 2021-12-27 (×2): 20 mg via INTRAVENOUS
  Filled 2021-12-25 (×2): qty 20

## 2021-12-25 MED ORDER — VITAL HIGH PROTEIN PO LIQD: 1000.0000 mL | ORAL | Status: DC

## 2021-12-25 MED ORDER — VECURONIUM BROMIDE 10 MG IV SOLR
10.0000 mg | Freq: Once | INTRAVENOUS | Status: AC
  Administered 2021-12-25: 10 mg via INTRAVENOUS
  Filled 2021-12-25: qty 10

## 2021-12-25 MED ORDER — SODIUM CHLORIDE 0.9 % IV SOLN
2.0000 g | Freq: Three times a day (TID) | INTRAVENOUS | Status: AC
  Administered 2021-12-25 – 2021-12-26 (×5): 2 g via INTRAVENOUS
  Filled 2021-12-25: qty 2
  Filled 2021-12-25: qty 12.5
  Filled 2021-12-25 (×3): qty 2

## 2021-12-25 NOTE — Procedures (Signed)
PROCEDURE: ENDOBRONCHIAL ULTRASOUND   PROCEDURE DATE: 12/25/2021  TIME:  NAME:  Janet Hensley  DOB:1970-02-01  MRN: [id number] LOC:  IC10A/IC10A-AA    HOSP DAY: @LENGTHOFSTAYDAYS @    Indications/Preliminary Diagnosis:Adenopathy REACTIVE   Consent: (Place X beside choice/s below)  The benefits, risks and possible complications of the procedure were        explained to:  ___ patient  _X__ patient's family  ___ other:___________  who verbalized understanding and gave:  ___ verbal  ___ written  _X__ verbal and written  ___ telephone  ___ other:________ consent.      Unable to obtain consent; procedure performed on emergent basis.     Other:       PRESEDATION ASSESSMENT: History and Physical has been performed. Patient meds and allergies have been reviewed. Presedation airway examination has been performed and documented. Baseline vital signs, sedation score, oxygenation status, and cardiac rhythm were reviewed. Patient was deemed to be in satisfactory condition to undergo the procedure.    PREMEDICATIONS: SEE ANESTHESIOLOGY RECORDS   Insertion Route (Place X beside choice below)   Nasal   Oral  X Endotracheal Tube   Tracheostomy   INTRAPROCEDURE MEDICATIONS: SEE ANESTHESIOLOGY RECORDS   PROCEDURE DETAILS: Timeout performed and correct patient, name, & ID confirmed. Following prep per Pulmonary policy, appropriate sedation was administered.  I proceeded with introducing the endobronchial scope and findings, technical procedures, and specimen collection as noted below. At the end of exam the scope was withdrawn without incident. Impression and Plan as noted below.   SPECIMENS (Sites): (Place X beside choice below)  Specimens Description  X No Specimens Obtained HIGHLY VASCULAR SUBCARINAL LN    Washings    Lavage    Biopsies    Fine Needle Aspirates    Brushings    Sputum    FINDINGS:      ESTIMATED BLOOD LOSS: none COMPLICATIONS/RESOLUTION:  none    IMPRESSION:POST-PROCEDURE DX: ADENOPATHY REACTIVE FROM PNEUMONIA    RECOMMENDATION/PLAN:  Follow up Pathology Reports     [name], M.D.  [facility]  Medical Director [facility] Medical Director [facility] Cardio-Pulmonary Department

## 2021-12-25 NOTE — Progress Notes (Signed)
RT assisted with bronchoscopy at bedside. Pt was extubated by MD and re-intubated with a # 8.5 oral Et tube for facilitation of bronchoscope. ET tube secured @ 26cm at the lip. Bronch cart was brought from procedure room 2 to ICU room 10.  Consent was obtained by RN and Time out performed by MD prior to procedure. Therapeutic scope # [id number] used for regular flexible bronchoscopy and EBUS scope # [id number] used for EBUS procedure.   Flexible Bronchoscopy:  Time Scope in: 11:17am             Time Scope out: 11:32 am  EBUS Procedure:  Time Scope in: 11:35am  Time Scope out: 11:37  Balloon still intact when scope removed from pt airway.

## 2021-12-25 NOTE — Progress Notes (Signed)
Rounding Note    Patient Name: Rohini C Parmelee Date of Encounter: 12/25/2021  [facility] Cardiologist: None   Subjective   Intubated, sedated.  Underwent left heart cath yesterday showing minimal nonobstructive disease.  EF is normal.  Inpatient Medications    Scheduled Meds:  budesonide (PULMICORT) nebulizer solution  0.25 mg Nebulization BID   Chlorhexidine Gluconate Cloth  6 each Topical Daily   docusate  100 mg Per Tube BID   enoxaparin (LOVENOX) injection  40 mg Subcutaneous Q24H   famotidine  20 mg Per Tube BID   feeding supplement (PROSource TF20)  60 mL Per Tube TID   feeding supplement (VITAL HIGH PROTEIN)  1,000 mL Per Tube Q24H   free water  30 mL Per Tube Q4H   insulin aspart  0-15 Units Subcutaneous Q4H   ipratropium-albuterol  3 mL Nebulization Q4H   methylPREDNISolone (SOLU-MEDROL) injection  20 mg Intravenous BID   multivitamin  15 mL Per Tube Daily   nicotine  21 mg Transdermal Daily   mouth rinse  15 mL Mouth Rinse Q2H   polyethylene glycol  17 g Per Tube Daily   sodium chloride flush  3 mL Intravenous Q12H   sodium chloride flush  3 mL Intravenous Q12H   Continuous Infusions:  sodium chloride Stopped (12/24/21 0009)   sodium chloride     azithromycin 250 mL/hr at 12/25/21 1100   cefTRIAXone (ROCEPHIN)  IV Stopped (12/25/21 4034)   fentaNYL infusion INTRAVENOUS 125 mcg/hr (12/25/21 1100)   norepinephrine (LEVOPHED) Adult infusion 1 mcg/min (12/25/21 1100)   propofol (DIPRIVAN) infusion 50 mcg/kg/min (12/25/21 1100)   PRN Meds: sodium chloride, acetaminophen **OR** acetaminophen, fentaNYL, ipratropium-albuterol, midazolam, mouth rinse, sodium chloride flush   Vital Signs    Vitals:   12/25/21 1015 12/25/21 1030 12/25/21 1045 12/25/21 1156  BP: 111/63 (!) 108/58 110/65   Pulse: (!) 108 (!) 106 (!) 102 (!) 122  Resp: (!) 29 (!) 28 (!) 28 20  Temp:      TempSrc:      SpO2: 95% 95% 96% (!) 89%  Weight:      Height:         Intake/Output Summary (Last 24 hours) at 12/25/2021 1205 Last data filed at 12/25/2021 1100 Gross per 24 hour  Intake 1806.81 ml  Output 1200 ml  Net 606.81 ml      12/25/2021    4:09 AM 12/24/2021    6:00 AM 12/23/2021    4:37 AM  Last 3 Weights  Weight (lbs) 196 lb 10.4 oz 183 lb 3.2 oz 195 lb 15.8 oz  Weight (kg) 89.2 kg 83.1 kg 88.9 kg      Telemetry    Sinus tachycardia, heart rate 107- Personally Reviewed  ECG     - Personally Reviewed  Physical Exam   GEN: Intubated, sedated Neck: No JVD Cardiac: RRR, no murmurs, rubs, or gallops.  Respiratory: Rhonchorous breath sounds GI: Soft, nontender, non-distended  MS: No edema; No deformity. Neuro: Unable to assess Psych: Unable to assess  Labs    High Sensitivity Troponin:   Recent Labs  Lab 12/22/21 1836 12/22/21 2019 12/24/21 1221 12/24/21 2010  TROPONINIHS 5 5 4 9      Chemistry Recent Labs  Lab 12/22/21 1836 12/22/21 2019 12/23/21 0411 12/24/21 0503 12/25/21 0349  NA 134*  --  129* 135 137  K 2.7*  --  3.3* 3.5 3.8  CL 98  --  94* 107 111  CO2 22  --  20* 20* 19*  GLUCOSE 110*  --  85 163* 137*  BUN 33*  --  24* 37* 47*  CREATININE 0.83  --  0.55 0.66 0.79  CALCIUM 7.3*  --  7.1* 8.5* 9.2  MG  --  2.4  --  2.6* 2.8*  PROT 5.3*  --  4.9*  --   --   ALBUMIN <1.5*  --  <1.5*  --   --   AST 168*  --  128*  --   --   ALT 40  --  36  --   --   ALKPHOS 56  --  46  --   --   BILITOT 1.3*  --  3.4*  --   --   GFRNONAA >60  --  >60 >60 >60  ANIONGAP 14  --  15 8 7     Lipids  Recent Labs  Lab 12/25/21 0349  TRIG 379*    Hematology Recent Labs  Lab 12/23/21 0411 12/24/21 0503 12/25/21 0349  WBC 5.5 8.2 12.5*  RBC 3.15* 2.93* 2.92*  HGB 9.9* 9.4* 9.0*  HCT 29.3* 27.5* 27.2*  MCV 93.0 93.9 93.2  MCH 31.4 32.1 30.8  MCHC 33.8 34.2 33.1  RDW 14.2 14.3 14.9  PLT 202 181 165   Thyroid  Recent Labs  Lab 12/22/21 2019  TSH 0.744    BNP Recent Labs  Lab 12/22/21 1836  BNP  15.2    DDimer No results for input(s): "DDIMER" in the last 168 hours.   Radiology    ECHOCARDIOGRAM COMPLETE  Result Date: 12/25/2021    ECHOCARDIOGRAM REPORT   Patient Name:   JUNEAU DOUGHMAN Date of Exam: 12/24/2021 Medical Rec #:  12/26/2021     Height:       63.0 in Accession #:    [id number]    Weight:       183.2 lb Date of Birth:  1970-05-23     BSA:          1.863 m Patient Age:    51 years      BP:           99/54 mmHg Patient Gender: F             HR:           97 bpm. Exam Location:  [facility] Procedure: 2D Echo, Cardiac Doppler and Color Doppler Indications:     I42.9 Cardiomyopathy  History:         Patient has no prior history of Echocardiogram examinations.  Sonographer:     05/19[birth year]1970 RDCS Referring Phys:  Daphine Deutscher CHRISTOPHER END Diagnosing Phys: [id number] MD IMPRESSIONS  1. Left ventricular ejection fraction, by estimation, is 55 to 60%. The left ventricle has normal function. Left ventricular endocardial border not optimally defined to evaluate regional wall motion. Left ventricular diastolic parameters were normal.  2. Right ventricular systolic function is normal. The right ventricular size is normal.  3. The mitral valve is normal in structure. No evidence of mitral valve regurgitation. No evidence of mitral stenosis.  4. The aortic valve has an indeterminant number of cusps. Aortic valve regurgitation is not visualized. No aortic stenosis is present. FINDINGS  Left Ventricle: Left ventricular ejection fraction, by estimation, is 55 to 60%. The left ventricle has normal function. Left ventricular endocardial border not optimally defined to evaluate regional wall motion. The left ventricular internal cavity size was normal in size. There is no left ventricular hypertrophy. Left ventricular  diastolic parameters were normal. Right Ventricle: The right ventricular size is normal. No increase in right ventricular wall thickness. Right ventricular systolic function is normal. Left  Atrium: Left atrial size was normal in size. Right Atrium: Right atrial size was normal in size. Pericardium: Trivial pericardial effusion is present. Mitral Valve: The mitral valve is normal in structure. No evidence of mitral valve regurgitation. No evidence of mitral valve stenosis. Tricuspid Valve: The tricuspid valve is normal in structure. Tricuspid valve regurgitation is mild. Aortic Valve: The aortic valve has an indeterminant number of cusps. Aortic valve regurgitation is not visualized. No aortic stenosis is present. Aortic valve mean gradient measures 6.1 mmHg. Aortic valve peak gradient measures 11.1 mmHg. Aortic valve area, by VTI measures 2.90 cm. Pulmonic Valve: The pulmonic valve was grossly normal. Pulmonic valve regurgitation is trivial. No evidence of pulmonic stenosis. Aorta: The aortic root and ascending aorta are structurally normal, with no evidence of dilitation. Pulmonary Artery: The pulmonary artery is of normal size. Venous: IVC assessment for right atrial pressure unable to be performed due to mechanical ventilation. IAS/Shunts: The interatrial septum was not well visualized.  LEFT VENTRICLE PLAX 2D LVIDd:         4.60 cm   Diastology LVIDs:         3.10 cm   LV e' medial:    11.50 cm/s LV PW:         0.70 cm   LV E/e' medial:  9.6 LV IVS:        0.60 cm   LV e' lateral:   10.95 cm/s LVOT diam:     2.10 cm   LV E/e' lateral: 10.1 LV SV:         66 LV SV Index:   36 LVOT Area:     3.46 cm  RIGHT VENTRICLE             IVC RV Basal diam:  3.20 cm     IVC diam: 1.90 cm RV S prime:     17.35 cm/s TAPSE (M-mode): 2.4 cm LEFT ATRIUM             Index        RIGHT ATRIUM           Index LA diam:        4.20 cm 2.25 cm/m   RA Area:     10.20 cm LA Vol (A2C):   46.0 ml 24.69 ml/m  RA Volume:   22.20 ml  11.92 ml/m LA Vol (A4C):   19.9 ml 10.68 ml/m LA Biplane Vol: 32.9 ml 17.66 ml/m  AORTIC VALVE AV Area (Vmax):    2.62 cm AV Area (Vmean):   2.37 cm AV Area (VTI):     2.90 cm AV Vmax:            166.83 cm/s AV Vmean:          118.032 cm/s AV VTI:            0.229 m AV Peak Grad:      11.1 mmHg AV Mean Grad:      6.1 mmHg LVOT Vmax:         126.33 cm/s LVOT Vmean:        80.933 cm/s LVOT VTI:          0.192 m LVOT/AV VTI ratio: 0.84  AORTA Ao Root diam: 3.40 cm Ao Asc diam:  2.90 cm MITRAL VALVE MV Area (PHT): 4.15 cm  SHUNTS MV Decel Time: 183 msec     Systemic VTI:  0.19 m MV E velocity: 110.50 cm/s  Systemic Diam: 2.10 cm MV A velocity: 101.25 cm/s MV E/A ratio:  1.09 Yvonne Kendall MD Electronically signed by Yvonne Kendall MD Signature Date/Time: 12/25/2021/7:20:13 AM    Final    CARDIAC CATHETERIZATION  Result Date: 12/24/2021 Conclusions: Mild, 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  Question vasospasm of the proximal RCA (intracoronary nitroglycerin not administered in the setting of hypotension).  No lesion identified to explain changes noted on EKG. Normal left ventricular systolic function (LVEF 50-55%) with mildly elevated filling pressure (LVEDP 20 mmHg). Recommendations: Follow-up echocardiogram. Medical therapy and risk factor modification to prevent progression of mild coronary artery disease. Continued management of noncardiogenic shock and respiratory failure per primary team. Yvonne Kendall, MD [facility]  CT HEAD WO CONTRAST ( )  Result Date: 12/23/2021 CLINICAL DATA:  Altered mental status EXAM: CT HEAD WITHOUT CONTRAST TECHNIQUE: Contiguous axial images were obtained from the base of the skull through the vertex without intravenous contrast. RADIATION DOSE REDUCTION: This exam was performed according to the departmental dose-optimization program which includes automated exposure control, adjustment of the mA and/or kV according to patient size and/or use of iterative reconstruction technique. COMPARISON:  None Available. FINDINGS: Brain: No evidence of acute infarction, hemorrhage, hydrocephalus, extra-axial collection or mass lesion/mass  effect. Vascular: No hyperdense vessel or unexpected calcification. Skull: Changes of prior craniotomy in the right posterior fossa. Sinuses/Orbits: No acute finding. Other: None. IMPRESSION: No acute intracranial abnormality noted. Electronically Signed   By: Alcide Clever M.D.   On: 12/23/2021 23:02   DG Abd 1 View  Result Date: 12/23/2021 CLINICAL DATA:  Respiratory failure, OG tube placement EXAM: ABDOMEN - 1 VIEW COMPARISON:  None Available. FINDINGS: Orogastric tube tip and side port overlie the stomach. No evidence of bowel obstruction. IMPRESSION: Orogastric tube tip and side port overlie the stomach. Electronically Signed   By: Caprice Renshaw M.D.   On: 12/23/2021 12:35   DG Chest Port 1 View  Result Date: 12/23/2021 CLINICAL DATA:  Respiratory failure EXAM: PORTABLE CHEST 1 VIEW COMPARISON:  Radiograph 12/22/2021 FINDINGS: Endotracheal tube overlies the trachea approximately 2.9 cm above the carina. Orogastric tube passes below the diaphragm, tip excluded by collimation. Persistent complete opacification of the left hemithorax. Unchanged patchy opacities in the right mid to lower lung. No evidence of pneumothorax. Bones are unchanged. IMPRESSION: Endotracheal tube overlies the trachea approximately 2.9 cm above the carina. Unchanged complete opacification of the left hemithorax and patchy airspace disease in the right lung. Electronically Signed   By: Caprice Renshaw M.D.   On: 12/23/2021 12:34    Cardiac Studies   TTE 12/24/2021 1. Left ventricular ejection fraction, by estimation, is 55 to 60%. The  left ventricle has normal function. Left ventricular endocardial border  not optimally defined to evaluate regional wall motion. Left ventricular  diastolic parameters were normal.   2. Right ventricular systolic function is normal. The right ventricular  size is normal.   3. The mitral valve is normal in structure. No evidence of mitral valve  regurgitation. No evidence of mitral stenosis.    4. The aortic valve has an indeterminant number of cusps. Aortic valve  regurgitation is not visualized. No aortic stenosis is present.   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  Question vasospasm of the proximal RCA (intracoronary nitroglycerin not administered in the setting of hypotension).  No lesion identified to explain changes noted on EKG. Normal left ventricular systolic function (LVEF 50-55%) with mildly elevated filling pressure (LVEDP 20 mmHg).   Recommendations: Follow-up echocardiogram. Medical therapy and risk factor modification to prevent progression of mild coronary artery disease. Continued management of noncardiogenic shock and respiratory failure per primary team.  Patient Profile     51 y.o. female with history of asthma, COPD, smoker presenting with shortness of breath diagnosed with pneumonia and COPD exacerbation.  Currently intubated for airway protection.  Seen for abnormal EKG changes consistent with MI, s/p left heart cath showing minimal nonobstructive disease, echocardiogram with normal EF.  Assessment & Plan    Abnormal EKG -Left heart cath showing minimal nonobstructive disease, 20% LAD and RCA. -Echo with preserved EF 55 to 60%. -Additional cardiac testing planned or indicated at this point. -Consider aspirin, smoking cessation recommendation when patient is Extubated.  2.  COPD, respiratory failure -Intubated -Management as per ICU team  No additional cardiac testing planned.  Cardiology will sign off.  Please let us know if additional input is needed.  Total encounter time more than 50 minutes  Greater than 50% was spent in counseling and coordination of care with the patient       Signed, Debbe Odea, MD  12/25/2021, 12:05 PM

## 2021-12-25 NOTE — IPAL (Signed)
  Interdisciplinary Goals of Care Family Meeting   Date carried out: 12/25/2021  Location of the meeting: Bedside  Member's involved: Physician and Family Member or next of kin   GOALS OF CARE DISCUSSION  The Clinical status was relayed to family in detail-HUSBAND  Updated and notified of patients medical condition- Patient remains unresponsive and will not open eyes to command.   Patient is having a weak cough and struggling to remove secretions.   Patient with increased WOB and using accessory muscles to breathe Explained to family course of therapy and the modalities   Patient with Progressive multiorgan failure with a very high probablity of a very minimal chance of meaningful recovery despite all aggressive and optimal medical therapy.  PATIENT REMAINS FULL CODE  Family understands the situation.  SEVERE LEFT LUNG DAMAGE LEGIONELLA PNEUMONIA AND PSEUDOMONAS PNEUMONIA CONTINUE VENT SUPPORT  Family are satisfied with Plan of action and management. All questions answered  Additional CC time 25 mins   Fahed Morten Santiago Glad, M.D.  [facility]  Medical Director [facility] Medical Director [facility] Cardio-Pulmonary Department

## 2021-12-25 NOTE — Procedures (Signed)
  PROCEDURE: BRONCHOSCOPY Therapeutic Aspiration of Tracheobronchial Tree  PROCEDURE DATE: 12/25/2021  TIME:  NAME:  Janet Hensley  DOB:[birth year]  MRN: [id number] LOC:  IC10A/IC10A-AA    HOSP DAY: @LENGTHOFSTAYDAYS @    Indications/Preliminary Diagnosis: LUNG OPACITY PNEUMONIA  Consent: (Place X beside choice/s below)  The benefits, risks and possible complications of the procedure were        explained to:  ___ patient  _x__ patient's family  ___ other:___________  who verbalized understanding and gave:  ___ verbal  ___ written  _x__ verbal and written  ___ telephone  ___ other:________ consent.      Unable to obtain consent; procedure performed on emergent basis.     Other:       PRESEDATION ASSESSMENT: History and Physical has been performed. Patient meds and allergies have been reviewed. Presedation airway examination has been performed and documented. Baseline vital signs, sedation score, oxygenation status, and cardiac rhythm were reviewed. Patient was deemed to be in satisfactory condition to undergo the procedure.    PREMEDICATIONS:   Sedative/Narcotic Amt Dose   Versed 4 mg   Fentanyl  mcg  Diprivan  mg         PROCEDURE DETAILS: Timeout performed and correct patient, name, & ID confirmed. Following prep per Pulmonary policy, appropriate sedation was administered. The Bronchoscope was inserted in to oral cavity with bite block in place. Therapeutic aspiration of Tracheobronchial tree was performed.  Airway exam proceeded with findings, technical procedures, and specimen collection as noted below. At the end of exam the scope was withdrawn without incident. Impression and Plan as noted below.        Insertion Route (Place X beside choice below)   Nasal   Oral  X Endotracheal Tube   Tracheostomy   INTRAPROCEDURE MEDICATIONS:  Sedative/Narcotic Amt Dose   Versed  mg   Fentanyl infusion 125 mcg  Diprivan infusion 75 mg        TECHNICAL PROCEDURES:  (Place X beside choice below)   Procedures  Description    None     Electrocautery     Cryotherapy     Balloon Dilatation     Bronchography     Stent Placement   X  Therapeutic Aspiration Thick mucoid purulent secretions from left lung segments    Laser/Argon Plasma    Brachytherapy Catheter Placement    Foreign Body Removal         SPECIMENS (Sites): (Place X beside choice below)  Specimens Description   No Specimens Obtained     Washings   X Lavage THICK MUCOID SECRETIONS   Biopsies    Fine Needle Aspirates   X Brushings LUL BRUSH   Sputum    FINDINGS: THICK MUCOPURULENT SECRETIONS AND MUCUS PLUGS ASPIRATED FROM ALL LEFT LUNG SEGMENTS  ESTIMATED BLOOD LOSS: none COMPLICATIONS/RESOLUTION: none   IMPRESSION:POST-PROCEDURE DX:  LIKELY DIAGNOSIS OF LEGIONELLA    RECOMMENDATION/PLAN:  CONTINUE IV ABX FOLLOW UP      , M.D.  [facility]  Medical Director [facility] Medical Director [facility] Cardio-Pulmonary Department

## 2021-12-25 NOTE — Progress Notes (Signed)
   12/25/21 1900  Clinical Encounter Type  Visited With Family  Visit Type Follow-up  Spiritual Encounters  Spiritual Needs Emotional

## 2021-12-25 NOTE — Progress Notes (Signed)
NAME:  Janet Hensley, MRN:  [id number], DOB:  1970/02/03, LOS: 3 ADMISSION DATE:  12/22/2021, CONSULTATION DATE:  12/22/2021 REFERRING MD: Florina Ou   CHIEF COMPLAINT:  SOB   HPI  51 y.o with significant PMH of Asthma, tobacco abuse, chronic neck and back pain, headache, anxiety and depression who presented to the ED with chief complaints of progressive shortness of breath.  Patient presented to outside ED on 11/19  with c/o cough, congestion,fever, nausea and vomiting that started 5 days ago. RAPID INFLUENZA/RSV/COVID PCR was negative. She was diagnosed with viral illness and sent home with Zofran and Tessalon. Patient report worsening symptoms over the course of several days with progressive SOB and cough therefore she called EMS. On EMS arrival, was found to be hypoxic at 89% on room air with significant increased work of breathing and wheezing on exam.  Patient received 3 DuoNeb treatments, IV Solu-Medrol and IV magnesium 2 g prior to arrival to the ED.   ED Course: Initial vital signs showed HR of 122 beats/minute, BP 115/14m Hg, the RR 35 breaths/minute, and the oxygen saturation 94 % on 2L [place] and a temperature of 98.35F (36.7C).  Pertinent Labs/Diagnostics Findings: Chemistry:Na+/ K+: 134/2.7  Glucose 110 : BUN/Cr.:33/0.83 Calcium: 7.3  AST/ALT:168/40 CBC: WBC: 6.0 Other Lab findings:   PCT:15.18  Lactic acid: 5.1 COVID PCR: Negative Venous Blood Gas result:  pO2 <31; pCO2 43; pH 7.37;  HCO3 24.9, %O2 Sat 31.7.  Imaging:  CXR>Opacification of the LEFT hemithorax which may represent consolidation, atelectasis and/or pleural effusion. CTH> CTA Chest> Near-complete opacification of the left lung with low-attenuation fluid/infiltrate. This is likely in part due to pneumonia possibly postobstructive in nature given mucus and secretions in multiple segmental bronchi throughout the left lung.  Patient given 30 cc/kg of fluids and started on broad-spectrum antibiotics Ceftriaxone and Doxy  for sepsis secondary to pneumonia. Patient admitted to hospitalist service. Due to progressive short of breath and high risk for decompensation and intubation, PCCM was consulted.  Past Medical History  Asthma, tobacco abuse, chronic neck and back pain, headache, anxiety and depression   Significant Hospital Events Notes  11/26: Admit to ICU with Acute hypoxic respiratory failure secondary to pneumonia 11/27 remains on BiPAP, high risk for intubation 11/27 patient intubated,family updated multiple times 11/28 patient with STEMI, MILD CAD, S/p cath diagnosis of coronary vasospasms 11/29 plan for EBUS/BRONCH   Consults:  PCCM  Procedures:  None  Significant Diagnostic Tests:  11/26: Chest Xray>Opacification of the LEFT hemithorax which may represent consolidation, atelectasis and/or pleural effusion. 11/26: CTA Chest>1. Near-complete opacification of the left lung with low-attenuation fluid/infiltrate. This is likely in part due to pneumonia possibly postobstructive in nature given mucus and secretions in multiple segmental bronchi throughout the left lung. However given the mediastinal adenopathy and nodularity in the right lung underlying malignancy is difficult to exclude. Short interval follow-up after treatment is recommended. 2. Multiple indeterminate pulmonary nodules in the right lung may be infectious/inflammatory however metastases are not excluded. 3. Mediastinal and right hilar adenopathy.  Micro Data:  11/26: SARS-CoV-2 PCR> negative 11/26: Influenza PCR> negative 11/26: Blood culture x2> 11/26: Urine Culture> 11/26: MRSA PCR>>  11/26: Strep pneumo urinary antigen> 11/26: Legionella urinary antigen> 11/26: Mycoplasma pneumonia>  Antimicrobials:  Vancomycin 11/27> Ceftriaxone 11/27>  OBJECTIVE  Blood pressure (!) 88/66, pulse 100, temperature 99.3 F (37.4 C), temperature source Axillary, resp. rate (!) 28, height _0  (1.6 m), weight 89.2 kg, last menstrual  period 01/16/2016, SpO2  97 %.    Vent Mode: PRVC FiO2 (%):  [60 %] 60 % Set Rate:  [20 bmp] 20 bmp Vt Set:  [450 mL] 450 mL PEEP:  [8 cmH20] 8 cmH20 Plateau Pressure:  [23 cmH20] 23 cmH20   Intake/Output Summary (Last 24 hours) at 12/25/2021 0735 Last data filed at 12/25/2021 0600 Gross per 24 hour  Intake 1716.55 ml  Output 1600 ml  Net 116.55 ml    Filed Weights   12/23/21 0437 12/24/21 0600 12/25/21 0409  Weight: 88.9 kg 83.1 kg 89.2 kg   EVENTS OVERNIGHT Remains on vent Remains critically ill PLAN FOR BRONCH TODAY   REVIEW OF SYSTEMS  PATIENT IS UNABLE TO PROVIDE COMPLETE REVIEW OF SYSTEMS DUE TO SEVERE CRITICAL ILLNESS   PHYSICAL EXAMINATION:  GENERAL:critically ill appearing, +resp distress EYES: Pupils equal, round, reactive to light.  No scleral icterus.  MOUTH: Moist mucosal membrane. INTUBATED NECK: Supple.  PULMONARY: Lungs clear to auscultation, +rhonchi, +wheezing CARDIOVASCULAR: S1 and S2.  Regular rate and rhythm GASTROINTESTINAL: Soft, nontender, -distended. Positive bowel sounds.  MUSCULOSKELETAL: No swelling, clubbing, or edema.  NEUROLOGIC: obtunded,sedated SKIN:normal, warm to touch, Capillary refill delayed  Pulses present bilaterally     Labs/imaging that I havepersonally reviewed  (right click and "Reselect all SmartList Selections" daily)     Labs   CBC: Recent Labs  Lab 12/22/21 1836 12/23/21 0411 12/24/21 0503 12/25/21 0349  WBC 6.0 5.5 8.2 12.5*  NEUTROABS 5.2  --   --   --   HGB 11.3* 9.9* 9.4* 9.0*  HCT 32.7* 29.3* 27.5* 27.2*  MCV 91.6 93.0 93.9 93.2  PLT 217 202 181 165     Basic Metabolic Panel: Recent Labs  Lab 12/22/21 1836 12/22/21 2019 12/23/21 0411 12/24/21 0503 12/25/21 0349  NA 134*  --  129* 135 137  K 2.7*  --  3.3* 3.5 3.8  CL 98  --  94* 107 111  CO2 22  --  20* 20* 19*  GLUCOSE 110*  --  85 163* 137*  BUN 33*  --  24* 37* 47*  CREATININE 0.83  --  0.55 0.66 0.79  CALCIUM 7.3*  --  7.1* 8.5*  9.2  MG  --  2.4  --  2.6* 2.8*  PHOS  --   --   --  3.2 3.8    GFR: Estimated Creatinine Clearance: 88.1 mL/min (by C-G formula based on SCr of 0.79 mg/dL). Recent Labs  Lab 12/22/21 1836 12/22/21 2205 12/23/21 0411 12/24/21 0503 12/25/21 0349  PROCALCITON 15.18  --   --  19.32 13.40  WBC 6.0  --  5.5 8.2 12.5*  LATICACIDVEN 5.1* 5.6* 2.8*  --   --      Liver Function Tests: Recent Labs  Lab 12/22/21 1836 12/23/21 0411  AST 168* 128*  ALT 40 36  ALKPHOS 56 46  BILITOT 1.3* 3.4*  PROT 5.3* 4.9*  ALBUMIN <1.5* <1.5*    No results for input(s): "LIPASE", "AMYLASE" in the last 168 hours. No results for input(s): "AMMONIA" in the last 168 hours.  ABG    Component Value Date/Time   PHART 7.36 12/23/2021 1332   PCO2ART 40 12/23/2021 1332   PO2ART 75 (L) 12/23/2021 1332   HCO3 22.6 12/23/2021 1332   ACIDBASEDEF 2.7 (H) 12/23/2021 1332   O2SAT 96.7 12/23/2021 1332     Coagulation Profile: No results for input(s): "INR", "PROTIME" in the last 168 hours.  Cardiac Enzymes: Recent Labs  Lab 12/22/21 1836  CKTOTAL 26*     HbA1C: No results found for: "HGBA1C"  CBG: Recent Labs  Lab 12/24/21 1105 12/24/21 1529 12/24/21 1957 12/24/21 2352 12/25/21 0337  GLUCAP 169* 172* 159* 152* 129*     Allergies No Known Allergies   Home Medications  Prior to Admission medications   Medication Sig Start Date End Date Taking? Authorizing Provider  benzonatate (TESSALON) 100 MG capsule Take 100 mg by mouth every 8 (eight) hours. Take 1 tablet every eight hours for seven days. 12/15/21 12/22/21 Yes [provider]  cyclobenzaprine (FLEXERIL) 10 MG tablet Take 10 mg by mouth as needed (for neck pain). 11/23/17  Yes [provider]  LORazepam (ATIVAN) 0.5 MG tablet Take 0.5-1 mg by mouth at bedtime as needed for sleep. 12/02/21  Yes [provider]  NARCAN 4 MG/0.1ML LIQD nasal spray kit Place 1 spray into the nose once. 11/14/21  Yes  [provider]  oxyCODONE-acetaminophen (PERCOCET) 10-325 MG tablet Take 1 tablet by mouth 4 (four) times daily as needed for pain.   Yes [provider]    Scheduled Meds:  budesonide (PULMICORT) nebulizer solution  0.25 mg Nebulization BID   Chlorhexidine Gluconate Cloth  6 each Topical Daily   docusate  100 mg Per Tube BID   enoxaparin (LOVENOX) injection  40 mg Subcutaneous Q24H   famotidine  20 mg Per Tube BID   feeding supplement (PROSource TF20)  60 mL Per Tube TID   feeding supplement (VITAL HIGH PROTEIN)  1,000 mL Per Tube Q24H   free water  30 mL Per Tube Q4H   insulin aspart  0-15 Units Subcutaneous Q4H   ipratropium-albuterol  3 mL Nebulization Q4H   methylPREDNISolone (SOLU-MEDROL) injection  20 mg Intravenous BID   multivitamin  15 mL Per Tube Daily   nicotine  21 mg Transdermal Daily   mouth rinse  15 mL Mouth Rinse Q2H   polyethylene glycol  17 g Per Tube Daily   sodium chloride flush  3 mL Intravenous Q12H   sodium chloride flush  3 mL Intravenous Q12H   Continuous Infusions:  sodium chloride Stopped (12/24/21 0009)   sodium chloride     cefTRIAXone (ROCEPHIN)  IV Stopped (12/24/21 0931)   doxycycline (VIBRAMYCIN) IV 125 mL/hr at 12/25/21 0600   fentaNYL infusion INTRAVENOUS 100 mcg/hr (12/25/21 0600)   norepinephrine (LEVOPHED) Adult infusion 1 mcg/min (12/25/21 0600)   propofol (DIPRIVAN) infusion 30 mcg/kg/min (12/25/21 0600)   PRN Meds:.sodium chloride, acetaminophen **OR** acetaminophen, fentaNYL, ipratropium-albuterol, midazolam, mouth rinse, sodium chloride flush    Assessment & Plan:  51 yo active smoker with severe and Acute Hypoxic Respiratory Failure secondary to  CAP With severe COPD exacerbation leading to severe and acute respiratory  failure LEFT SIDED OPACIFICATION CONCERNING FOR MALIGNANCY   Severe ACUTE Hypoxic and Hypercapnic Respiratory Failure -continue Mechanical Ventilator support -Wean Fio2 and PEEP as  tolerated -VAP/VENT bundle implementation - Wean PEEP & FiO2 as tolerated, maintain SpO2 > 88% - Head of bed elevated 30 degrees, VAP protocol in place - Plateau pressures less than 30 cm H20  - Intermittent chest x-ray & ABG PRN - Ensure adequate pulmonary hygiene  Plan for bronch today to assess for lung cancer Plan for endotracheal tube exchange prior to bronch   SEVERE COPD EXACERBATION -continue IV steroids as prescribed -continue NEB THERAPY as prescribed   INFECTIOUS DISEASE -continue antibiotics as prescribed -follow up cultures Severe Sepsis Due to CAP POA   Hypokalemia Hyponatremia -Monitor I&O's / urinary  output -Follow BMP -Replace electrolytes as indicated     ENDO - ICU hypoglycemic\Hyperglycemia protocol -check FSBS per protocol   GI GI PROPHYLAXIS as indicated  NUTRITIONAL STATUS DIET-->TF's as tolerated Constipation protocol as indicated   ELECTROLYTES -follow labs as needed -replace as needed -pharmacy consultation and following   NEUROLOGY -need for sedation -Goal RASS -2 to -3     Best practice:  Diet:  NPO Pain/Anxiety/Delirium protocol (if indicated): No VAP protocol (if indicated): Not indicated DVT prophylaxis: LMWH GI prophylaxis: N/A Glucose control:  SSI No Central venous access:  N/A Arterial line:  N/A Foley:  N/A Mobility:  bed rest  PT consulted: N/A Last date of multidisciplinary goals of care discussion [12/22/21] Code Status:  full code Disposition: ICU      DVT/GI PRX  assessed I Assessed the need for Labs I Assessed the need for Foley I Assessed the need for Central Venous Line Family Discussion when available I Assessed the need for Mobilization I made an Assessment of medications to be adjusted accordingly Safety Risk assessment completed  CASE DISCUSSED IN MULTIDISCIPLINARY ROUNDS WITH ICU TEAM     Critical Care Time devoted to patient care services described in this note is 55 minutes.   Critical care was necessary to treat /prevent imminent and life-threatening deterioration. Overall, patient is critically ill, prognosis is guarded.    Corrin Parker, M.D.  [facility]  Medical Director [place] Director [facility] Cardio-Pulmonary Department

## 2021-12-26 ENCOUNTER — Inpatient Hospital Stay: Payer: 59

## 2021-12-26 ENCOUNTER — Inpatient Hospital Stay: Payer: Self-pay

## 2021-12-26 DIAGNOSIS — R0603 Acute respiratory distress: Secondary | ICD-10-CM | POA: Diagnosis not present

## 2021-12-26 LAB — BASIC METABOLIC PANEL
Anion gap: 3 — ABNORMAL LOW (ref 5–15)
BUN: 53 mg/dL — ABNORMAL HIGH (ref 6–20)
CO2: 24 mmol/L (ref 22–32)
Calcium: 8.8 mg/dL — ABNORMAL LOW (ref 8.9–10.3)
Chloride: 118 mmol/L — ABNORMAL HIGH (ref 98–111)
Creatinine, Ser: 0.71 mg/dL (ref 0.44–1.00)
GFR, Estimated: >60 mL/min (ref >60)
Glucose, Bld: 131 mg/dL — ABNORMAL HIGH (ref 70–99)
Potassium: 3.9 mmol/L (ref 3.5–5.1)
Sodium: 145 mmol/L (ref 135–145)

## 2021-12-26 LAB — CBC
HCT: 28.8 % — ABNORMAL LOW (ref 36.0–46.0)
Hemoglobin: 9.5 g/dL — ABNORMAL LOW (ref 12.0–15.0)
MCH: 30.9 pg (ref 26.0–34.0)
MCHC: 33 g/dL (ref 30.0–36.0)
MCV: 93.8 fL (ref 80.0–100.0)
Platelets: 146 10*3/uL — ABNORMAL LOW (ref 150–400)
RBC: 3.07 MIL/uL — ABNORMAL LOW (ref 3.87–5.11)
RDW: 14.8 % (ref 11.5–15.5)
WBC: 13.5 10*3/uL — ABNORMAL HIGH (ref 4.0–10.5)
nRBC: 0.5 % — ABNORMAL HIGH (ref 0.0–0.2)

## 2021-12-26 LAB — HEMOGLOBIN A1C
Hgb A1c MFr Bld: 5.8 % — ABNORMAL HIGH (ref 4.8–5.6)
Mean Plasma Glucose: 120 mg/dL

## 2021-12-26 LAB — GLUCOSE, CAPILLARY
Glucose-Capillary: 149 mg/dL — ABNORMAL HIGH (ref 70–99)
Glucose-Capillary: 161 mg/dL — ABNORMAL HIGH (ref 70–99)
Glucose-Capillary: 136 mg/dL — ABNORMAL HIGH (ref 70–99)
Glucose-Capillary: 107 mg/dL — ABNORMAL HIGH (ref 70–99)
Glucose-Capillary: 107 mg/dL — ABNORMAL HIGH (ref 70–99)
Glucose-Capillary: 133 mg/dL — ABNORMAL HIGH (ref 70–99)

## 2021-12-26 LAB — MAGNESIUM: Magnesium: 2.6 mg/dL — ABNORMAL HIGH (ref 1.7–2.4)

## 2021-12-26 LAB — PHOSPHORUS: Phosphorus: 4 mg/dL (ref 2.5–4.6)

## 2021-12-26 MED ORDER — VITAL AF 1.2 CAL PO LIQD
1000.0000 mL | ORAL | Status: DC
  Administered 2021-12-26 – 2021-12-27 (×2): 1000 mL

## 2021-12-26 MED ORDER — PROPOFOL 1000 MG/100ML IV EMUL
5.0000 ug/kg/min | INTRAVENOUS | Status: DC
  Administered 2021-12-26 (×2): 30 ug/kg/min via INTRAVENOUS
  Administered 2021-12-26: 10 ug/kg/min via INTRAVENOUS
  Administered 2021-12-27: 40 ug/kg/min via INTRAVENOUS
  Administered 2021-12-27 – 2021-12-28 (×2): 30 ug/kg/min via INTRAVENOUS
  Administered 2021-12-28: 40 ug/kg/min via INTRAVENOUS
  Filled 2021-12-26 (×8): qty 100

## 2021-12-26 MED ORDER — VALACYCLOVIR HCL 500 MG PO TABS
1000.0000 mg | ORAL_TABLET | Freq: Two times a day (BID) | ORAL | Status: DC
  Administered 2021-12-26 – 2021-12-28 (×5): 1000 mg
  Filled 2021-12-26 (×5): qty 2

## 2021-12-26 MED ORDER — PROSOURCE TF20 ENFIT COMPATIBL EN LIQD
60.0000 mL | Freq: Every day | ENTERAL | Status: DC
  Administered 2021-12-27 – 2021-12-28 (×2): 60 mL

## 2021-12-26 MED ORDER — BISACODYL 10 MG RE SUPP
10.0000 mg | Freq: Once | RECTAL | Status: AC
  Administered 2021-12-26: 10 mg via RECTAL
  Filled 2021-12-26: qty 1

## 2021-12-26 MED ORDER — LEVOFLOXACIN IN D5W 750 MG/150ML IV SOLN
750.0000 mg | INTRAVENOUS | Status: DC
  Administered 2021-12-27: 750 mg via INTRAVENOUS
  Filled 2021-12-26 (×2): qty 150

## 2021-12-26 NOTE — Progress Notes (Signed)
Pt Peak Pressure consistently in mid 30's since Bronch 12/25/21. MD notified during rounds

## 2021-12-26 NOTE — IPAL (Signed)
  Interdisciplinary Goals of Care Family Meeting   Date carried out: 12/26/2021  Location of the meeting: Bedside  Member's involved: Physician and Family Member or next of kin    GOALS OF CARE DISCUSSION  The Clinical status was relayed to family in detail-Husband   Updated and notified of patients medical condition- Patient remains unresponsive and will not open eyes to command.   Patient is having a weak cough and struggling to remove secretions.   Patient with increased WOB and using accessory muscles to breathe Explained to family course of therapy and the modalities   Patient with Progressive multiorgan failure with a very high probablity of a very minimal chance of meaningful recovery despite all aggressive and optimal medical therapy.  PATIENT REMAINS FULL CODE  Family understands the situation. SEVERE LUNG DAMAGE FROM PNEUNONIA +LEGIONELLA +PSEUDOMONAS +STENOTROPHOMONAS  PATIENT WITH SEVERE HYPOXIA ANTICIPATE PROLONGED ICU LOS AND POSSIBLE NEED TO [facility]  Family are satisfied with Plan of action and management. All questions answered  Additional CC time 36 mins   Tiyona Desouza Santiago Glad, M.D.  [facility]  Medical Director [facility] Medical Director [facility] Cardio-Pulmonary Department

## 2021-12-26 NOTE — TOC Initial Note (Signed)
Transition of Care [facility]) - Initial/Assessment Note    Patient Details  Name: Janet Hensley MRN: [id number] Date of Birth: Feb 06, 1970  [facility] of [facility]) CM/SW Contact:    Allayne Butcher, RN Phone Number: 12/26/2021, 2:20 PM  Clinical Narrative:                 Patient admitted to the hospital with respiratory distress requiring intubation.  Patient currently in the ICU intubated and sedated.  Patient is from home.  TOC will follow.   Expected Discharge Plan:  (TBD) Barriers to Discharge: Continued Medical Work up   Patient Goals and CMS Choice Patient states their goals for this hospitalization and ongoing recovery are:: unable to state- intubated      Expected Discharge Plan and Services Expected Discharge Plan:  (TBD)   Discharge Planning Services: CM Consult   Living arrangements for the past 2 months: Single Family Home                   DME Agency: NA                  Prior Living Arrangements/Services Living arrangements for the past 2 months: Single Family Home   Patient language and need for interpreter reviewed:: Yes        Need for Family Participation in Patient Care: Yes (Comment) Care giver support system in place?: Yes (comment)   Criminal Activity/Legal Involvement Pertinent to Current Situation/Hospitalization: No - Comment as needed  Activities of Daily Living Home Assistive Devices/Equipment: None ADL Screening (condition at time of admission) Patient's cognitive ability adequate to safely complete daily activities?: Yes Is the patient deaf or have difficulty hearing?: No Does the patient have difficulty seeing, even when wearing glasses/contacts?: No Does the patient have difficulty concentrating, remembering, or making decisions?: No Patient able to express need for assistance with ADLs?: No Does the patient have difficulty dressing or bathing?: No Independently performs ADLs?: Yes (appropriate for developmental age) Does the  patient have difficulty walking or climbing stairs?: No Weakness of Legs: None Weakness of Arms/Hands: None  Permission Sought/Granted      Share Information with NAME: Janet Hensley     Permission granted to share info w Relationship: spouse  Permission granted to share info w Contact Information: [phone]  Emotional Assessment Appearance:: Appears stated age Attitude/Demeanor/Rapport: Intubated (Following Commands or Not Following Commands) Affect (typically observed): Unable to Assess   Alcohol / Substance Use: Tobacco Use Psych Involvement: No (comment)  Admission diagnosis:  Respiratory distress [R06.03] Hypoxia [R09.02] Patient Active Problem List   Diagnosis Date Noted   Adenopathy 12/25/2021   Coronary artery disease involving native coronary artery of native heart without angina pectoris 12/25/2021   ST elevation myocardial infarction (STEMI) (HCC) 12/24/2021   Respiratory distress 12/22/2021   Acute respiratory failure with hypoxia (HCC) 12/22/2021   Abnormal EKG 12/22/2021   Electrolyte abnormality 12/22/2021   Tobacco use 01/15/2012   PCP:  Gildardo Pounds, PA Pharmacy:  No Pharmacies Listed    Social Determinants of Health (SDOH) Interventions    Readmission Risk Interventions     No data to display

## 2021-12-26 NOTE — Progress Notes (Signed)
NAME:  Janet Hensley, MRN:  [id number], DOB:  August 28, 1970, LOS: 4 ADMISSION DATE:  12/22/2021, CONSULTATION DATE:  12/22/2021 REFERRING MD: Florina Ou   CHIEF COMPLAINT:  SOB   HPI  51 y.o with significant PMH of Asthma, tobacco abuse, chronic neck and back pain, headache, anxiety and depression who presented to the ED with chief complaints of progressive shortness of breath.  Patient presented to outside ED on 11/19  with c/o cough, congestion,fever, nausea and vomiting that started 5 days ago. RAPID INFLUENZA/RSV/COVID PCR was negative. She was diagnosed with viral illness and sent home with Zofran and Tessalon. Patient report worsening symptoms over the course of several days with progressive SOB and cough therefore she called EMS. On EMS arrival, was found to be hypoxic at 89% on room air with significant increased work of breathing and wheezing on exam.  Patient received 3 DuoNeb treatments, IV Solu-Medrol and IV magnesium 2 g prior to arrival to the ED.   ED Course: Initial vital signs showed HR of 122 beats/minute, BP 115/6m Hg, the RR 35 breaths/minute, and the oxygen saturation 94 % on 2L [place] and a temperature of 98.50F (36.7C).  Pertinent Labs/Diagnostics Findings: Chemistry:Na+/ K+: 134/2.7  Glucose 110 : BUN/Cr.:33/0.83 Calcium: 7.3  AST/ALT:168/40 CBC: WBC: 6.0 Other Lab findings:   PCT:15.18  Lactic acid: 5.1 COVID PCR: Negative Venous Blood Gas result:  pO2 <31; pCO2 43; pH 7.37;  HCO3 24.9, %O2 Sat 31.7.  Imaging:  CXR>Opacification of the LEFT hemithorax which may represent consolidation, atelectasis and/or pleural effusion. CTH> CTA Chest> Near-complete opacification of the left lung with low-attenuation fluid/infiltrate. This is likely in part due to pneumonia possibly postobstructive in nature given mucus and secretions in multiple segmental bronchi throughout the left lung.  Patient given 30 cc/kg of fluids and started on broad-spectrum antibiotics Ceftriaxone and Doxy  for sepsis secondary to pneumonia. Patient admitted to hospitalist service. Due to progressive short of breath and high risk for decompensation and intubation, PCCM was consulted.  Past Medical History  Asthma, tobacco abuse, chronic neck and back pain, headache, anxiety and depression   Significant Hospital Events Notes  11/26: Admit to ICU with Acute hypoxic respiratory failure secondary to pneumonia 11/27 remains on BiPAP, high risk for intubation 11/27 patient intubated,family updated multiple times 11/28 patient with STEMI, MILD CAD, S/p cath diagnosis of coronary vasospasms 11/29 plan for EBUS/BRONCH 11/29 s/p bronch extensive amounts f purulent secreitons 11/29 +LEGIONELLA AG and PSEUDOMONAS PNEUMONIA   Consults:  PCCM  Procedures:  None  Significant Diagnostic Tests:  11/26: Chest Xray>Opacification of the LEFT hemithorax which may represent consolidation, atelectasis and/or pleural effusion. 11/26: CTA Chest>1. Near-complete opacification of the left lung with low-attenuation fluid/infiltrate. This is likely in part due to pneumonia possibly postobstructive in nature given mucus and secretions in multiple segmental bronchi throughout the left lung. However given the mediastinal adenopathy and nodularity in the right lung underlying malignancy is difficult to exclude. Short interval follow-up after treatment is recommended. 2. Multiple indeterminate pulmonary nodules in the right lung may be infectious/inflammatory however metastases are not excluded. 3. Mediastinal and right hilar adenopathy.  Micro Data:  11/26: SARS-CoV-2 PCR> negative 11/26: Influenza PCR> negative 11/26: Blood culture x2> 11/26: Urine Culture> 11/26: MRSA PCR>>  11/26: Strep pneumo urinary antigen> 11/26: Legionella urinary antigen> 11/26: Mycoplasma pneumonia>  Antimicrobials:  Vancomycin 11/27>11/29 Ceftriaxone 11/27>11/29  Antibiotics Given (last 72 hours)     Date/Time Action  Medication Dose Rate   12/23/21 1112 New  Bag/Given   cefTRIAXone (ROCEPHIN) 2 g in sodium chloride 0.9 % 100 mL IVPB 2 g 200 mL/hr   12/23/21 1754 New Bag/Given   doxycycline (VIBRAMYCIN) 100 mg in sodium chloride 0.9 % 250 mL IVPB 100 mg 125 mL/hr   12/24/21 0009 New Bag/Given   vancomycin (VANCOREADY) IVPB 1250 mg/250 mL 1,250 mg 166.7 mL/hr   12/24/21 0558 New Bag/Given   doxycycline (VIBRAMYCIN) 100 mg in sodium chloride 0.9 % 250 mL IVPB 100 mg 125 mL/hr   12/24/21 0901 New Bag/Given   cefTRIAXone (ROCEPHIN) 2 g in sodium chloride 0.9 % 100 mL IVPB 2 g 200 mL/hr   12/24/21 1706 New Bag/Given   doxycycline (VIBRAMYCIN) 100 mg in sodium chloride 0.9 % 250 mL IVPB 100 mg 125 mL/hr   12/25/21 0539 New Bag/Given   doxycycline (VIBRAMYCIN) 100 mg in sodium chloride 0.9 % 250 mL IVPB 100 mg 125 mL/hr   12/25/21 0928 New Bag/Given   cefTRIAXone (ROCEPHIN) 2 g in sodium chloride 0.9 % 100 mL IVPB 2 g 200 mL/hr   12/25/21 1039 New Bag/Given   azithromycin (ZITHROMAX) 500 mg in sodium chloride 0.9 % 250 mL IVPB 500 mg 250 mL/hr   12/25/21 1349 New Bag/Given   ceFEPIme (MAXIPIME) 2 g in sodium chloride 0.9 % 100 mL IVPB 2 g 200 mL/hr   12/25/21 2222 New Bag/Given   ceFEPIme (MAXIPIME) 2 g in sodium chloride 0.9 % 100 mL IVPB 2 g 200 mL/hr   12/26/21 0545 New Bag/Given   ceFEPIme (MAXIPIME) 2 g in sodium chloride 0.9 % 100 mL IVPB 2 g 200 mL/hr        OBJECTIVE  Blood pressure 104/71, pulse 92, temperature 97.9 F (36.6 C), temperature source Axillary, resp. rate (!) 34, height _0  (1.6 m), weight 85.6 kg, last menstrual period 01/16/2016, SpO2 93 %.    Vent Mode: PRVC FiO2 (%):  [55 %] 55 % Set Rate:  [14 bmp] 14 bmp Vt Set:  [450 mL] 450 mL PEEP:  [5 cmH20] 5 cmH20 Plateau Pressure:  [25 cmH20] 25 cmH20   Intake/Output Summary (Last 24 hours) at 12/26/2021 0810 Last data filed at 12/26/2021 0600 Gross per 24 hour  Intake 1776.66 ml  Output 1530 ml  Net 246.66 ml    Filed  Weights   12/24/21 0600 12/25/21 0409 12/26/21 0500  Weight: 83.1 kg 89.2 kg 85.6 kg   EVENTS OVERNIGHT Remains on vent Severe hypoxia Unable to wean from vent  Vent Mode: PRVC FiO2 (%):  [55 %] 55 % Set Rate:  [14 bmp] 14 bmp Vt Set:  [450 mL] 450 mL PEEP:  [5 cmH20] 5 cmH20 Plateau Pressure:  [25 cmH20] 25 cmH20     REVIEW OF SYSTEMS  PATIENT IS UNABLE TO PROVIDE COMPLETE REVIEW OF SYSTEMS DUE TO SEVERE CRITICAL ILLNESS   PHYSICAL EXAMINATION:  GENERAL:critically ill appearing, +resp distress EYES: Pupils equal, round, reactive to light.  No scleral icterus.  MOUTH: Moist mucosal membrane. INTUBATED NECK: Supple.  PULMONARY: Lungs clear to auscultation, +rhonchi, +wheezing CARDIOVASCULAR: S1 and S2.  Regular rate and rhythm GASTROINTESTINAL: Soft, nontender, -distended. Positive bowel sounds.  MUSCULOSKELETAL: No swelling, clubbing, or edema.  NEUROLOGIC: obtunded,sedated SKIN:normal, warm to touch, Capillary refill delayed  Pulses present bilaterally   Labs/imaging that I havepersonally reviewed  (right click and "Reselect all SmartList Selections" daily)     Labs   CBC: Recent Labs  Lab 12/22/21 1836 12/23/21 0411 12/24/21 0503 12/25/21 0349 12/26/21 0307  WBC  6.0 5.5 8.2 12.5* 13.5*  NEUTROABS 5.2  --   --   --   --   HGB 11.3* 9.9* 9.4* 9.0* 9.5*  HCT 32.7* 29.3* 27.5* 27.2* 28.8*  MCV 91.6 93.0 93.9 93.2 93.8  PLT 217 202 181 165 146*     Basic Metabolic Panel: Recent Labs  Lab 12/22/21 1836 12/22/21 2019 12/23/21 0411 12/24/21 0503 12/25/21 0349 12/26/21 0307  NA 134*  --  129* 135 137 145  K 2.7*  --  3.3* 3.5 3.8 3.9  CL 98  --  94* 107 111 118*  CO2 22  --  20* 20* 19* 24  GLUCOSE 110*  --  85 163* 137* 131*  BUN 33*  --  24* 37* 47* 53*  CREATININE 0.83  --  0.55 0.66 0.79 0.71  CALCIUM 7.3*  --  7.1* 8.5* 9.2 8.8*  MG  --  2.4  --  2.6* 2.8* 2.6*  PHOS  --   --   --  3.2 3.8 4.0    GFR: Estimated Creatinine Clearance: 86.3  mL/min (by C-G formula based on SCr of 0.71 mg/dL). Recent Labs  Lab 12/22/21 1836 12/22/21 2205 12/23/21 0411 12/24/21 0503 12/25/21 0349 12/26/21 0307  PROCALCITON 15.18  --   --  19.32 13.40  --   WBC 6.0  --  5.5 8.2 12.5* 13.5*  LATICACIDVEN 5.1* 5.6* 2.8*  --   --   --      Liver Function Tests: Recent Labs  Lab 12/22/21 1836 12/23/21 0411  AST 168* 128*  ALT 40 36  ALKPHOS 56 46  BILITOT 1.3* 3.4*  PROT 5.3* 4.9*  ALBUMIN <1.5* <1.5*    No results for input(s): "LIPASE", "AMYLASE" in the last 168 hours. No results for input(s): "AMMONIA" in the last 168 hours.  ABG    Component Value Date/Time   PHART 7.36 12/23/2021 1332   PCO2ART 40 12/23/2021 1332   PO2ART 75 (L) 12/23/2021 1332   HCO3 22.6 12/23/2021 1332   ACIDBASEDEF 2.7 (H) 12/23/2021 1332   O2SAT 96.7 12/23/2021 1332     Coagulation Profile: No results for input(s): "INR", "PROTIME" in the last 168 hours.  Cardiac Enzymes: Recent Labs  Lab 12/22/21 1836  CKTOTAL 26*     HbA1C: Hgb A1c MFr Bld  Date/Time Value Ref Range Status  12/25/2021 03:49 AM 5.8 (H) 4.8 - 5.6 % Final    Comment:    (NOTE)         Prediabetes: 5.7 - 6.4         Diabetes: >6.4         Glycemic control for adults with diabetes: <7.0     CBG: Recent Labs  Lab 12/25/21 1555 12/25/21 2037 12/25/21 2346 12/26/21 0419 12/26/21 0757  GLUCAP 140* 127* 120* 149* 161*     Allergies No Known Allergies   Home Medications  Prior to Admission medications   Medication Sig Start Date End Date Taking? Authorizing Provider  benzonatate (TESSALON) 100 MG capsule Take 100 mg by mouth every 8 (eight) hours. Take 1 tablet every eight hours for seven days. 12/15/21 12/22/21 Yes [provider]  cyclobenzaprine (FLEXERIL) 10 MG tablet Take 10 mg by mouth as needed (for neck pain). 11/23/17  Yes [provider]  LORazepam (ATIVAN) 0.5 MG tablet Take 0.5-1 mg by mouth at bedtime as needed for sleep.  12/02/21  Yes [provider]  NARCAN 4 MG/0.1ML LIQD nasal spray kit Place 1 spray  into the nose once. 11/14/21  Yes [provider]  oxyCODONE-acetaminophen (PERCOCET) 10-325 MG tablet Take 1 tablet by mouth 4 (four) times daily as needed for pain.   Yes [provider]    Scheduled Meds:  budesonide (PULMICORT) nebulizer solution  0.25 mg Nebulization BID   Chlorhexidine Gluconate Cloth  6 each Topical Daily   docusate  100 mg Per Tube BID   enoxaparin (LOVENOX) injection  40 mg Subcutaneous Q24H   famotidine  20 mg Per Tube BID   feeding supplement (PROSource TF20)  60 mL Per Tube TID   feeding supplement (VITAL HIGH PROTEIN)  1,000 mL Per Tube Q24H   free water  30 mL Per Tube Q4H   insulin aspart  0-15 Units Subcutaneous Q4H   ipratropium-albuterol  3 mL Nebulization Q4H   methylPREDNISolone (SOLU-MEDROL) injection  20 mg Intravenous BID   multivitamin  15 mL Per Tube Daily   nicotine  21 mg Transdermal Daily   mouth rinse  15 mL Mouth Rinse Q2H   polyethylene glycol  17 g Per Tube Daily   sodium chloride flush  3 mL Intravenous Q12H   sodium chloride flush  3 mL Intravenous Q12H   Continuous Infusions:  sodium chloride Stopped (12/24/21 0009)   sodium chloride     azithromycin Stopped (12/25/21 1139)   ceFEPime (MAXIPIME) IV 200 mL/hr at 12/26/21 0600   fentaNYL infusion INTRAVENOUS 125 mcg/hr (12/26/21 0600)   midazolam 10 mg/hr (12/26/21 0600)   norepinephrine (LEVOPHED) Adult infusion Stopped (12/25/21 1858)   PRN Meds:.sodium chloride, acetaminophen **OR** acetaminophen, fentaNYL, ipratropium-albuterol, midazolam, mouth rinse, sodium chloride flush, vecuronium    Assessment & Plan:  51 yo active smoker with severe and Acute Hypoxic Respiratory Failure secondary to  CAP With severe COPD exacerbation leading to severe and acute respiratory  failure LEFT SIDED OPACIFICATION CONCERNING FOR MALIGNANCY +LEGIONELLA PNEUMONIA(EXPOSURE TO HOT  TUBS) +PSEUDOMONAS PNEUMONIA  Severe ACUTE Hypoxic and Hypercapnic Respiratory Failure -continue Mechanical Ventilator support -Wean Fio2 and PEEP as tolerated -VAP/VENT bundle implementation - Wean PEEP & FiO2 as tolerated, maintain SpO2 > 88% - Head of bed elevated 30 degrees, VAP protocol in place - Plateau pressures less than 30 cm H20  - Intermittent chest x-ray & ABG PRN - Ensure adequate pulmonary hygiene  -will NOT perform SAT/SBT    SEVERE COPD EXACERBATION -continue IV steroids as prescribed -continue NEB THERAPY as prescribed  INFECTIOUS DISEASE -continue antibiotics as prescribed Severe Sepsis Due to CAP POA   Hypokalemia Hyponatremia -Monitor I&O's / urinary output -Follow BMP -Replace electrolytes as indicated    ENDO - ICU hypoglycemic\Hyperglycemia protocol -check FSBS per protocol   GI GI PROPHYLAXIS as indicated  NUTRITIONAL STATUS DIET-->TF's as tolerated Constipation protocol as indicated   ELECTROLYTES -follow labs as needed -replace as needed -pharmacy consultation and following   NEUROLOGY -need for sedation -Goal RASS -2 to -3     Best practice:  Diet:  NPO Pain/Anxiety/Delirium protocol (if indicated): No VAP protocol (if indicated): Not indicated DVT prophylaxis: LMWH GI prophylaxis: N/A Glucose control:  SSI No Central venous access:  N/A Arterial line:  N/A Foley:  N/A Mobility:  bed rest  PT consulted: N/A Last date of multidisciplinary goals of care discussion [12/22/21] Code Status:  full code Disposition: ICU      DVT/GI PRX  assessed I Assessed the need for Labs I Assessed the need for Foley I Assessed the need for Central Venous Line Family Discussion when available I Assessed the  need for Mobilization I made an Assessment of medications to be adjusted accordingly Safety Risk assessment completed  CASE DISCUSSED IN MULTIDISCIPLINARY ROUNDS WITH ICU TEAM     Critical Care Time devoted to  patient care services described in this note is 55 minutes.  Critical care was necessary to treat /prevent imminent and life-threatening deterioration. Overall, patient is critically ill, prognosis is guarded.     Corrin Parker, M.D.  [facility]  Medical Director [place] Director [facility] Cardio-Pulmonary Department

## 2021-12-26 NOTE — Progress Notes (Signed)
Nutrition Follow Up Note   DOCUMENTATION CODES:   Obesity unspecified  INTERVENTION:   Change to Vital 1.2_0 /hr continuous + ProSource TF 20- Give 35m daily via tube  Free water flushes 340mq4 hours to maintain tube patency   Regimen provides 1520kcal/day, 110g/day protein and 115323may of free water.   Pt at refeed risk; recommend monitor potassium, magnesium and phosphorus labs daily until stable  NUTRITION DIAGNOSIS:   Inadequate oral intake related to inability to eat (pt sedated and ventilated) as evidenced by NPO status.  GOAL:   Provide needs based on ASPEN/SCCM guidelines - met   MONITOR:   Vent status, Labs, Weight trends, TF tolerance, Skin, I & O's  ASSESSMENT:   [age]o with significant PMH of asthma, tobacco abuse, chronic neck/back pain, headache, anxiety and depression who is admitted with COPD exacerbation and CAP.  Pt s/p bronchoscopy 11/29; pt with legionella and pseudomonas PNA  Pt remains sedated and ventilated. OGT in place. Pt tolerating tube feeds well at goal rate; will adjust r/t propofol changes. No BM since admission; plan is for suppository today. Per chart, pt appears weight stable since admission. Pt may require trach/G-tube.    Medications reviewed and include: colace, lovenox, pepcid, insulin, solu-medrol, MVI, nicotine, miralax, cefepime, Levaquin, propofol   Labs reviewed: K 3.9 wnl, BUN 53(H), P 4.0 wnl, Mg 2.6(H) Wbc- 13.5(H), Hgb 9.5(L), Hct 28.8(L) Cbgs- 136, 161, 149 x 24 hrs   Patient is currently intubated on ventilator support MV: 6.0 L/min Temp (24hrs), Avg:98.3 F (36.8 C), Min:97.9 F (36.6 C), Max:98.5 F (36.9 C)  Propofol: 5.4 ml/hr- provides 142kcal/day   MAP- >61m80m  Diet Order:   Diet Order             Diet NPO time specified  Diet effective now                  EDUCATION NEEDS:   No education needs have been identified at this time  Skin:  Skin Assessment: Reviewed RN Assessment  Last  BM:  PTA  Height:   Ht Readings from Last 1 Encounters:  12/23/21 _1  (1.6 m)    Weight:   Wt Readings from Last 1 Encounters:  12/26/21 85.6 kg    Ideal Body Weight:  52.2 kg  BMI:  Body mass index is 33.43 kg/m.  Estimated Nutritional Needs:   Kcal:  1300-1600kcal/day  Protein:  >105g/day  Fluid:  1.6-1.8L/day  CaseKoleen Distance RD, LDN Please refer to [facility] RD and/or RD on-call/weekend/after hours pager

## 2021-12-27 ENCOUNTER — Inpatient Hospital Stay: Payer: 59

## 2021-12-27 DIAGNOSIS — J9602 Acute respiratory failure with hypercapnia: Secondary | ICD-10-CM

## 2021-12-27 DIAGNOSIS — R0603 Acute respiratory distress: Secondary | ICD-10-CM | POA: Diagnosis not present

## 2021-12-27 DIAGNOSIS — J151 Pneumonia due to Pseudomonas: Secondary | ICD-10-CM

## 2021-12-27 DIAGNOSIS — A481 Legionnaires' disease: Secondary | ICD-10-CM | POA: Diagnosis not present

## 2021-12-27 DIAGNOSIS — J9601 Acute respiratory failure with hypoxia: Secondary | ICD-10-CM

## 2021-12-27 DIAGNOSIS — N179 Acute kidney failure, unspecified: Secondary | ICD-10-CM | POA: Diagnosis not present

## 2021-12-27 LAB — BLOOD GAS, ARTERIAL
Acid-base deficit: 6.7 mmol/L — ABNORMAL HIGH (ref 0.0–2.0)
Acid-base deficit: 3.4 mmol/L — ABNORMAL HIGH (ref 0.0–2.0)
Acid-base deficit: 3.5 mmol/L — ABNORMAL HIGH (ref 0.0–2.0)
Bicarbonate: 25.2 mmol/L (ref 20.0–28.0)
Bicarbonate: 27.1 mmol/L (ref 20.0–28.0)
Bicarbonate: 24.6 mmol/L (ref 20.0–28.0)
FIO2: 80 %
FIO2: 0.8 %
FIO2: 80 %
MECHVT: 400 mL
MECHVT: 400 mL
MECHVT: 380 mL
Mechanical Rate: 16
Mechanical Rate: 26
O2 Saturation: 92.3 %
O2 Saturation: 93.2 %
O2 Saturation: 96.9 %
PEEP: 10 cmH2O
PEEP: 10 cmH2O
PEEP: 10 cmH2O
Patient temperature: 37
Patient temperature: 37
Patient temperature: 37
RATE: 16 resp/min
RATE: 22 resp/min
pCO2 arterial: 85 mmHg (ref 32–48)
pCO2 arterial: 76 mmHg (ref 32–48)
pCO2 arterial: 56 mmHg — ABNORMAL HIGH (ref 32–48)
pH, Arterial: 7.08 — CL (ref 7.35–7.45)
pH, Arterial: 7.16 — CL (ref 7.35–7.45)
pH, Arterial: 7.25 — ABNORMAL LOW (ref 7.35–7.45)
pO2, Arterial: 74 mmHg — ABNORMAL LOW (ref 83–108)
pO2, Arterial: 69 mmHg — ABNORMAL LOW (ref 83–108)
pO2, Arterial: 75 mmHg — ABNORMAL LOW (ref 83–108)

## 2021-12-27 LAB — BASIC METABOLIC PANEL
Anion gap: 6 (ref 5–15)
Anion gap: 7 (ref 5–15)
BUN: 80 mg/dL — ABNORMAL HIGH (ref 6–20)
BUN: 86 mg/dL — ABNORMAL HIGH (ref 6–20)
CO2: 23 mmol/L (ref 22–32)
CO2: 27 mmol/L (ref 22–32)
Calcium: 9 mg/dL (ref 8.9–10.3)
Calcium: 8 mg/dL — ABNORMAL LOW (ref 8.9–10.3)
Chloride: 119 mmol/L — ABNORMAL HIGH (ref 98–111)
Chloride: 110 mmol/L (ref 98–111)
Creatinine, Ser: 1.15 mg/dL — ABNORMAL HIGH (ref 0.44–1.00)
Creatinine, Ser: 1.43 mg/dL — ABNORMAL HIGH (ref 0.44–1.00)
GFR, Estimated: 58 mL/min — ABNORMAL LOW (ref >60)
GFR, Estimated: 44 mL/min — ABNORMAL LOW (ref >60)
Glucose, Bld: 173 mg/dL — ABNORMAL HIGH (ref 70–99)
Glucose, Bld: 346 mg/dL — ABNORMAL HIGH (ref 70–99)
Potassium: 4.8 mmol/L (ref 3.5–5.1)
Potassium: 5.1 mmol/L (ref 3.5–5.1)
Sodium: 148 mmol/L — ABNORMAL HIGH (ref 135–145)
Sodium: 144 mmol/L (ref 135–145)

## 2021-12-27 LAB — CBC
HCT: 29.7 % — ABNORMAL LOW (ref 36.0–46.0)
Hemoglobin: 9.6 g/dL — ABNORMAL LOW (ref 12.0–15.0)
MCH: 31.2 pg (ref 26.0–34.0)
MCHC: 32.3 g/dL (ref 30.0–36.0)
MCV: 96.4 fL (ref 80.0–100.0)
Platelets: 106 10*3/uL — ABNORMAL LOW (ref 150–400)
RBC: 3.08 MIL/uL — ABNORMAL LOW (ref 3.87–5.11)
RDW: 15.6 % — ABNORMAL HIGH (ref 11.5–15.5)
WBC: 16.9 10*3/uL — ABNORMAL HIGH (ref 4.0–10.5)
nRBC: 0.3 % — ABNORMAL HIGH (ref 0.0–0.2)

## 2021-12-27 LAB — GLUCOSE, CAPILLARY
Glucose-Capillary: 147 mg/dL — ABNORMAL HIGH (ref 70–99)
Glucose-Capillary: 160 mg/dL — ABNORMAL HIGH (ref 70–99)
Glucose-Capillary: 165 mg/dL — ABNORMAL HIGH (ref 70–99)
Glucose-Capillary: 176 mg/dL — ABNORMAL HIGH (ref 70–99)
Glucose-Capillary: 209 mg/dL — ABNORMAL HIGH (ref 70–99)
Glucose-Capillary: 359 mg/dL — ABNORMAL HIGH (ref 70–99)

## 2021-12-27 LAB — CULTURE, BLOOD (ROUTINE X 2)
Culture: NO GROWTH
Special Requests: ADEQUATE

## 2021-12-27 LAB — PHOSPHORUS: Phosphorus: 5.6 mg/dL — ABNORMAL HIGH (ref 2.5–4.6)

## 2021-12-27 LAB — CYTOLOGY - NON PAP

## 2021-12-27 LAB — PROCALCITONIN: Procalcitonin: 5.19 ng/mL

## 2021-12-27 LAB — TRIGLYCERIDES: Triglycerides: 357 mg/dL — ABNORMAL HIGH (ref <150)

## 2021-12-27 LAB — CK: Total CK: 31 U/L — ABNORMAL LOW (ref 38–234)

## 2021-12-27 LAB — MAGNESIUM: Magnesium: 2.8 mg/dL — ABNORMAL HIGH (ref 1.7–2.4)

## 2021-12-27 LAB — BRAIN NATRIURETIC PEPTIDE: B Natriuretic Peptide: 110.4 pg/mL — ABNORMAL HIGH (ref 0.0–100.0)

## 2021-12-27 LAB — LACTIC ACID, PLASMA
Lactic Acid, Venous: 1.1 mmol/L (ref 0.5–1.9)
Lactic Acid, Venous: 1.5 mmol/L (ref 0.5–1.9)

## 2021-12-27 MED ORDER — SODIUM CHLORIDE 0.9 % IV SOLN
2.0000 g | Freq: Three times a day (TID) | INTRAVENOUS | Status: DC
  Administered 2021-12-27: 2 g via INTRAVENOUS
  Filled 2021-12-27: qty 2

## 2021-12-27 MED ORDER — LEVALBUTEROL HCL 1.25 MG/0.5ML IN NEBU
2.5000 mg | INHALATION_SOLUTION | RESPIRATORY_TRACT | Status: AC
  Administered 2021-12-27: 2.5 mg via RESPIRATORY_TRACT

## 2021-12-27 MED ORDER — SODIUM CHLORIDE 0.9% FLUSH: 10.0000 mL | INTRAVENOUS | Status: DC | PRN

## 2021-12-27 MED ORDER — LEVALBUTEROL HCL 0.63 MG/3ML IN NEBU
INHALATION_SOLUTION | RESPIRATORY_TRACT | Status: AC
  Filled 2021-12-27: qty 6

## 2021-12-27 MED ORDER — SODIUM CHLORIDE 0.9 % IV SOLN: 2.0000 g | Freq: Two times a day (BID) | INTRAVENOUS | Status: DC

## 2021-12-27 MED ORDER — FREE WATER
100.0000 mL | Status: DC
  Administered 2021-12-27 – 2021-12-28 (×8): 100 mL

## 2021-12-27 MED ORDER — SODIUM BICARBONATE 8.4 % IV SOLN
50.0000 meq | Freq: Once | INTRAVENOUS | Status: AC
  Administered 2021-12-27: 50 meq via INTRAVENOUS
  Filled 2021-12-27: qty 50

## 2021-12-27 MED ORDER — TOBRAMYCIN 300 MG/5ML IN NEBU
300.0000 mg | INHALATION_SOLUTION | Freq: Two times a day (BID) | RESPIRATORY_TRACT | Status: DC
  Administered 2021-12-28: 300 mg via RESPIRATORY_TRACT
  Filled 2021-12-27 (×3): qty 5

## 2021-12-27 MED ORDER — SODIUM CHLORIDE 0.9 % IV SOLN
2.0000 g | Freq: Three times a day (TID) | INTRAVENOUS | Status: DC
  Administered 2021-12-28: 2 g via INTRAVENOUS
  Filled 2021-12-27: qty 2
  Filled 2021-12-27: qty 12.5

## 2021-12-27 MED ORDER — DEXTROSE 5 % IV SOLN: INTRAVENOUS | Status: DC

## 2021-12-27 MED ORDER — LEVALBUTEROL HCL 1.25 MG/0.5ML IN NEBU
INHALATION_SOLUTION | RESPIRATORY_TRACT | Status: AC
  Filled 2021-12-27: qty 0.5

## 2021-12-27 MED ORDER — LACTULOSE 10 GM/15ML PO SOLN
20.0000 g | Freq: Two times a day (BID) | ORAL | Status: AC
  Administered 2021-12-27 (×2): 20 g
  Filled 2021-12-27 (×2): qty 30

## 2021-12-27 MED ORDER — SODIUM CHLORIDE 0.9% FLUSH
10.0000 mL | Freq: Two times a day (BID) | INTRAVENOUS | Status: DC
  Administered 2021-12-27 – 2021-12-28 (×3): 10 mL

## 2021-12-27 MED ORDER — SODIUM CHLORIDE 0.9 % IV SOLN
2.0000 g | Freq: Three times a day (TID) | INTRAVENOUS | Status: DC
  Filled 2021-12-27 (×2): qty 2

## 2021-12-27 MED ORDER — NOREPINEPHRINE 4 MG/250ML-% IV SOLN: 0.0000 ug/min | INTRAVENOUS | Status: DC

## 2021-12-27 MED ORDER — MINOCYCLINE HCL 50 MG PO CAPS
200.0000 mg | ORAL_CAPSULE | Freq: Two times a day (BID) | ORAL | Status: DC
  Administered 2021-12-27 – 2021-12-28 (×2): 200 mg via NASOGASTRIC
  Filled 2021-12-27 (×2): qty 4

## 2021-12-27 MED ORDER — SODIUM BICARBONATE 8.4 % IV SOLN: 50.0000 meq | Freq: Once | INTRAVENOUS | Status: DC

## 2021-12-27 MED ORDER — SODIUM CHLORIDE 0.9 % IV SOLN
2.0000 g | Freq: Three times a day (TID) | INTRAVENOUS | Status: DC
  Filled 2021-12-27 (×2): qty 12.5

## 2021-12-27 MED ORDER — SODIUM BICARBONATE 8.4 % IV SOLN
INTRAVENOUS | Status: DC
  Filled 2021-12-27 (×4): qty 1000

## 2021-12-27 NOTE — Progress Notes (Signed)
NAME:  Janet Hensley, MRN:  [id number], DOB:  Janet Hensley 10, 1972, LOS: 5 ADMISSION DATE:  12/22/2021, CONSULTATION DATE:  12/22/2021 REFERRING MD:  Dr. Posey Pronto, CHIEF COMPLAINT:  Shortness of breath   Brief Pt Description / Synopsis:  51 yo active smoker admitted with Acute Hypoxic & Hypercapnic Respiratory Failure secondary to CAP (Legionella /Stenotrophomonas/Pseudomonas) with Left sided opacification (negative for malignancy) & AECOPD requiring intubation and mechanical ventilation.  History of Present Illness:  51 y.o with significant PMH of Asthma, tobacco abuse, chronic neck and back pain, headache, anxiety and depression who presented to the ED with chief complaints of progressive shortness of breath.   Patient presented to outside ED on 11/19  with c/o cough, congestion,fever, nausea and vomiting that started 5 days ago. RAPID INFLUENZA/RSV/COVID PCR was negative. She was diagnosed with viral illness and sent home with Zofran and Tessalon. Patient report worsening symptoms over the course of several days with progressive SOB and cough therefore she called EMS. On EMS arrival, was found to be hypoxic at 89% on room air with significant increased work of breathing and wheezing on exam.  Patient received 3 DuoNeb treatments, IV Solu-Medrol and IV magnesium 2 g prior to arrival to the ED.   ED Course: Initial vital signs showed HR of 122 beats/minute, BP 115/16m Hg, the RR 35 breaths/minute, and the oxygen saturation 94 % on 2L [place] and a temperature of 98.40F (36.7C).  Pertinent Labs/Diagnostics Findings: Chemistry:Na+/ K+: 134/2.7  Glucose 110 : BUN/Cr.:33/0.83 Calcium: 7.3  AST/ALT:168/40 CBC: WBC: 6.0 Other Lab findings:   PCT:15.18  Lactic acid: 5.1 COVID PCR: Negative Venous Blood Gas result:  pO2 <31; pCO2 43; pH 7.37;  HCO3 24.9, %O2 Sat 31.7.  Imaging:  CXR>Opacification of the LEFT hemithorax which may represent consolidation, atelectasis and/or pleural effusion. CTH> CTA Chest> Near-complete  opacification of the left lung with low-attenuation fluid/infiltrate. This is likely in part due to pneumonia possibly postobstructive in nature given mucus and secretions in multiple segmental bronchi throughout the left lung.   Patient given 30 cc/kg of fluids and started on broad-spectrum antibiotics Ceftriaxone and Doxy for sepsis secondary to pneumonia. Patient admitted to hospitalist service. Due to progressive short of breath and high risk for decompensation and intubation, PCCM was consulted.  Please see "significant hospital events" section below for full detailed hospital course.  Pertinent  Medical History   Past Medical History:  Diagnosis Date   Asthma 01/15/2012     Micro Data:  11/26: SARS-CoV-2 PCR> negative 11/26: Influenza PCR> negative 11/26: Blood culture >ROTHIA MUCILAGINOSA  11/26: Urine Culture> 11/26: MRSA PCR>> negative 11/26: Strep pneumo urinary antigen>negative 11/26: Legionella urinary antigen> POSITIVE 11/26: Mycoplasma pneumonia> negative 11/26: HIV Screen>>nonreactive 11/27: RVP >>negative 11/27: Urine>> no growth 11/27: Tracheal aspirate>>PSEUDOMONAS AERUGINOSA , STENOTROPHOMONAS MALTOPHILIA  11/29: BAL>>PSEUDOMONAS AERUGINOSA , STENOTROPHOMONAS MALTOPHILIA  11/29: BAL>>negative for Legionella  Antimicrobials:   Antibiotics Given (last 72 hours)     Date/Time Action Medication Dose Rate   12/24/21 0901 New Bag/Given   cefTRIAXone (ROCEPHIN) 2 g in sodium chloride 0.9 % 100 mL IVPB 2 g 200 mL/hr   12/24/21 1706 New Bag/Given   doxycycline (VIBRAMYCIN) 100 mg in sodium chloride 0.9 % 250 mL IVPB 100 mg 125 mL/hr   12/25/21 0539 New Bag/Given   doxycycline (VIBRAMYCIN) 100 mg in sodium chloride 0.9 % 250 mL IVPB 100 mg 125 mL/hr   12/25/21 0928 New Bag/Given   cefTRIAXone (ROCEPHIN) 2 g in sodium chloride 0.9 % 100 mL IVPB 2  g 200 mL/hr   12/25/21 1039 New Bag/Given   azithromycin (ZITHROMAX) 500 mg in sodium chloride 0.9 % 250 mL IVPB 500 mg  250 mL/hr   12/25/21 1349 New Bag/Given   ceFEPIme (MAXIPIME) 2 g in sodium chloride 0.9 % 100 mL IVPB 2 g 200 mL/hr   12/25/21 2222 New Bag/Given   ceFEPIme (MAXIPIME) 2 g in sodium chloride 0.9 % 100 mL IVPB 2 g 200 mL/hr   12/26/21 0545 New Bag/Given   ceFEPIme (MAXIPIME) 2 g in sodium chloride 0.9 % 100 mL IVPB 2 g 200 mL/hr   12/26/21 0086 New Bag/Given   azithromycin (ZITHROMAX) 500 mg in sodium chloride 0.9 % 250 mL IVPB 500 mg 250 mL/hr   12/26/21 1453 New Bag/Given   ceFEPIme (MAXIPIME) 2 g in sodium chloride 0.9 % 100 mL IVPB 2 g 200 mL/hr   12/26/21 1503 Given   valACYclovir (VALTREX) tablet 1,000 mg 1,000 mg    12/26/21 2131 New Bag/Given   ceFEPIme (MAXIPIME) 2 g in sodium chloride 0.9 % 100 mL IVPB 2 g 200 mL/hr   12/26/21 2133 Given   valACYclovir (VALTREX) tablet 1,000 mg 1,000 mg         Significant Hospital Events: Including procedures, antibiotic start and stop dates in addition to other pertinent events   11/26: Admit to ICU with Acute hypoxic respiratory failure secondary to pneumonia 11/27 remains on BiPAP, high risk for intubation 11/27 patient intubated,family updated multiple times 11/28 patient with STEMI, MILD CAD, S/p cath diagnosis of coronary vasospasms 11/29 plan for EBUS/BRONCH 11/29 s/p bronch extensive amounts f purulent secreitons 11/29 +LEGIONELLA AG and PSEUDOMONAS PNEUMONIA 11/30: Remains critically ill on vent.  ID consulted for multi organism pneumonia. Checking immunoglobulins to rule out immunocompromise. May need repeat Bronch  Interim History / Subjective:  -No significant events noted overnight -Afebrile, hemodynamically stable, no vasopressors  -Remains on high vent support: 70% FiO2, 10 PEEP ~ with high peak pressures >35 ~ adjusted TV to 400 cc (7 cc/kg IBW) and increased RR to 16 to match TV  with previous vent settings ~ follow up ABG -Leukocytosis worsened to 16.9 K from 13.5 K -Consult ID today due to pneumonia from multiple  organisms -Checking immunoglobulins to rule out immuno compromise -Cytology NEGATIVE FOR MALIGNANCY -May need repeat Bronch ~ Dr. Dillard Cannon to discuss with pt's husband -Mild hypernatremia ~ will increase free water flushes -Slight worsening of BUN and creatinine, electrolytes ok and no metabolic acidosis ~ continue to monitor  Objective   Blood pressure 106/62, pulse (!) 106, temperature 99.2 F (37.3 C), temperature source Axillary, resp. rate (!) 28, height _0  (1.6 m), weight 88.2 kg, last menstrual period 01/16/2016, SpO2 96 %.    Vent Mode: PRVC FiO2 (%):  [55 %-70 %] 70 % Set Rate:  [14 bmp] 14 bmp Vt Set:  [450 mL] 450 mL PEEP:  [5 cmH20-10 cmH20] 10 cmH20 Plateau Pressure:  [26 cmH20] 26 cmH20   Intake/Output Summary (Last 24 hours) at 12/27/2021 0741 Last data filed at 12/27/2021 0600 Gross per 24 hour  Intake 1957.95 ml  Output 1220 ml  Net 737.95 ml   Filed Weights   12/25/21 0409 12/26/21 0500 12/27/21 0407  Weight: 89.2 kg 85.6 kg 88.2 kg    Examination: General: Acute on chronically ill-appearing female, laying in bed, intubated sedated, no acute distress HENT: Atraumatic, normocephalic, neck supple, no JVD Lungs: Extremely coarse breath sounds throughout, overbreathing the vent, even Cardiovascular: Tachycardia, regular rhythm, S1-S2,  no murmurs, rubs, gallops Abdomen: Soft, nontender, nondistended, no guarding no tenderness, bowel sounds positive x 4 Extremities: 1+ edema bilateral lower extremities, no deformities, warm extremities Neuro: Sedated, RASS -2, does not follow commands, withdraws to pain, pupils PERRLA GU: Foley catheter draining yellow urine  Resolved Hospital Problem list     Assessment & Plan:   Acute Hypoxic & Hypercapnic Respiratory Failure in setting of AECOPD, Community Acquired Pneumonia with Left sided Opacification (Cytology NEGATIVE FOR MALIGNANCY) -Full vent support, implement lung protective strategies -Plateau pressures less than  30 cm H20 -Wean FiO2 & PEEP as tolerated to maintain O2 sats >92% -Follow intermittent Chest X-ray & ABG as needed -Spontaneous Breathing Trials when respiratory parameters met and mental status permits -Implement VAP Bundle -Bronchodilators & Pulmicort nebs -Continue Solu-Medrol 20 mg twice daily -ABX as above  Severe sepsis due to Pneumonia: Legionella, stenotrophomonas, Pseudomonas  -Monitor fever curve -Trend WBC's & Procalcitonin -Follow cultures as above -Continue empiric Levaquin pending cultures & sensitivities -Will consult ID for further assistance with antibiotics -Checking immunoglobulins to rule out immunocompromise  Tachycardia due to sepsis Inferior STEMI on EKG: Due to VASOSPASM (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) Echocardiogram 12/24/21: LVEF 55 to 80%, normal diastolic parameters, RV systolic function and size normal -Continuous cardiac monitoring -Maintain MAP >65 -IV fluids -Vasopressors as needed to maintain MAP goal ~ not requiring currently  -Trend lactic acid until normalized -HS Troponin negative x2 -Evaluated by Cardiology, appreciate input ~ has signed off  Mild Hyponatremia -Monitor I&O's / urinary output -Follow BMP -Ensure adequate renal perfusion -Avoid nephrotoxic agents as able -Replace electrolytes as indicated -Increase free water flushes  Normocytic Normochromic Anemia without s/sx of bleeding Thrombocytopenia -Monitor for S/Sx of bleeding -Trend CBC -Lovenox for VTE Prophylaxis  -Transfuse for Hgb <7 -Transfuse platelets for PLT count <50K with active bleeding  Hyperglycemia -CBG's q4h; Target range of 140 to 180 -SSI -Follow ICU Hypo/Hyperglycemia protocol  Sedation needs in setting of mechanical ventilation -Maintain a RASS goal of 0 to -1 -Fentanyl, Versed, and Propofol as needed to maintain RASS goal -Avoid sedating medications as able -Daily wake up assessment    Patient is critically ill.  Prognosis is guarded.   High risk for further decompensation, cardiac arrest and death.    Best Practice (right click and "Reselect all SmartList Selections" daily)   Diet/type: tubefeeds and NPO DVT prophylaxis: LMWH GI prophylaxis: H2B Lines: N/A Foley:  Yes, and it is still needed Code Status:  full code Last date of multidisciplinary goals of care discussion [12/1]  12/1: Pt's husband updated at bedside.  Labs   CBC: Recent Labs  Lab 12/22/21 1836 12/23/21 0411 12/24/21 0503 12/25/21 0349 12/26/21 0307 12/27/21 0433  WBC 6.0 5.5 8.2 12.5* 13.5* 16.9*  NEUTROABS 5.2  --   --   --   --   --   HGB 11.3* 9.9* 9.4* 9.0* 9.5* 9.6*  HCT 32.7* 29.3* 27.5* 27.2* 28.8* 29.7*  MCV 91.6 93.0 93.9 93.2 93.8 96.4  PLT 217 202 181 165 146* 106*    Basic Metabolic Panel: Recent Labs  Lab 12/22/21 2019 12/23/21 0411 12/24/21 0503 12/25/21 0349 12/26/21 0307 12/27/21 0433  NA  --  129* 135 137 145 148*  K  --  3.3* 3.5 3.8 3.9 4.8  CL  --  94* 107 111 118* 119*  CO2  --  20* 20* 19* 24 23  GLUCOSE  --  85 163* 137* 131* 173*  BUN  --  24* 37* 47* 53* 80*  CREATININE  --  0.55 0.66 0.79 0.71 1.15*  CALCIUM  --  7.1* 8.5* 9.2 8.8* 9.0  MG 2.4  --  2.6* 2.8* 2.6* 2.8*  PHOS  --   --  3.2 3.8 4.0 5.6*   GFR: Estimated Creatinine Clearance: 60.9 mL/min (A) (by C-G formula based on SCr of 1.15 mg/dL (H)). Recent Labs  Lab 12/22/21 1836 12/22/21 2205 12/23/21 0411 12/24/21 0503 12/25/21 0349 12/26/21 0307 12/27/21 0433  PROCALCITON 15.18  --   --  19.32 13.40  --   --   WBC 6.0  --  5.5 8.2 12.5* 13.5* 16.9*  LATICACIDVEN 5.1* 5.6* 2.8*  --   --   --   --     Liver Function Tests: Recent Labs  Lab 12/22/21 1836 12/23/21 0411  AST 168* 128*  ALT 40 36  ALKPHOS 56 46  BILITOT 1.3* 3.4*  PROT 5.3* 4.9*  ALBUMIN <1.5* <1.5*   No results for input(s): "LIPASE", "AMYLASE" in the last 168 hours. No results for input(s): "AMMONIA" in the last 168 hours.  ABG    Component Value  Date/Time   PHART 7.36 12/23/2021 1332   PCO2ART 40 12/23/2021 1332   PO2ART 75 (L) 12/23/2021 1332   HCO3 22.6 12/23/2021 1332   ACIDBASEDEF 2.7 (H) 12/23/2021 1332   O2SAT 96.7 12/23/2021 1332     Coagulation Profile: No results for input(s): "INR", "PROTIME" in the last 168 hours.  Cardiac Enzymes: Recent Labs  Lab 12/22/21 1836  CKTOTAL 26*    HbA1C: Hgb A1c MFr Bld  Date/Time Value Ref Range Status  12/25/2021 03:49 AM 5.8 (H) 4.8 - 5.6 % Final    Comment:    (NOTE)         Prediabetes: 5.7 - 6.4         Diabetes: >6.4         Glycemic control for adults with diabetes: <7.0     CBG: Recent Labs  Lab 12/26/21 1129 12/26/21 1624 12/26/21 1948 12/26/21 2313 12/27/21 0347  GLUCAP 136* 107* 107* 133* 165*    Review of Systems:   Unable to assess due to intubation/sedation/critical illness   Past Medical History:  She,  has a past medical history of Asthma (01/15/2012).   Surgical History:   Past Surgical History:  Procedure Laterality Date   LEFT HEART CATH AND CORONARY ANGIOGRAPHY N/A 12/24/2021   Procedure: LEFT HEART CATH AND CORONARY ANGIOGRAPHY;  Surgeon: Nelva Bush, MD;  Location: [facility];  Service: Cardiovascular;  Laterality: N/A;     Social History:      Family History:  Her family history is not on file.   Allergies No Known Allergies   Home Medications  Prior to Admission medications   Medication Sig Start Date End Date Taking? Authorizing Provider  cyclobenzaprine (FLEXERIL) 10 MG tablet Take 10 mg by mouth as needed (for neck pain). 11/23/17  Yes [provider]  LORazepam (ATIVAN) 0.5 MG tablet Take 0.5-1 mg by mouth at bedtime as needed for sleep. 12/02/21  Yes [provider]  NARCAN 4 MG/0.1ML LIQD nasal spray kit Place 1 spray into the nose once. 11/14/21  Yes [provider]  oxyCODONE-acetaminophen (PERCOCET) 10-325 MG tablet Take 1 tablet by mouth 4 (four) times daily as needed  for pain.   Yes [provider]     Critical care time: 40 minutes     Darel Hong, AGACNP-BC [facility] Prefer epic messenger for cross cover needs If after hours, please call E-link

## 2021-12-27 NOTE — Progress Notes (Signed)
I personally showed multiple chest x-rays to the patient's husband.  I discussed with him that the left lung has minimal aeration and looks mostly white on the chest x-ray.  Also discussed that the right lung base is also showing signs of worsening pneumonia.  Discussed need for bronchoscopy.  The husband however wants to hold off on bronchoscopy he wants to" minimize number of hands in the pot" discussed to do a lavage with 20 cc saline through the endotracheal tube he has agreed to that.

## 2021-12-27 NOTE — Progress Notes (Signed)
Peripherally Inserted Central Catheter Placement  The IV Nurse has discussed with the patient and/or persons authorized to consent for the patient, the purpose of this procedure and the potential benefits and risks involved with this procedure.  The benefits include less needle sticks, lab draws from the catheter, and the patient may be discharged home with the catheter. Risks include, but not limited to, infection, bleeding, blood clot (thrombus formation), and puncture of an artery; nerve damage and irregular heartbeat and possibility to perform a PICC exchange if needed/ordered by physician.  Alternatives to this procedure were also discussed.  Bard Power PICC patient education guide, fact sheet on infection prevention and patient information card has been provided to patient /or left at bedside.    PICC Placement Documentation  PICC Triple Lumen 12/27/21 Right Basilic 40 cm 0 cm (Active)  Indication for Insertion or Continuance of Line Vasoactive infusions 12/27/21 1321  Exposed Catheter (cm) 0 cm 12/27/21 1321  Site Assessment Clean, Dry, Intact 12/27/21 1321  Lumen #1 Status Flushed;Saline locked;Blood return noted 12/27/21 1321  Lumen #2 Status Flushed;Saline locked;Blood return noted 12/27/21 1321  Lumen #3 Status Flushed;Saline locked;Blood return noted 12/27/21 1321  Dressing Type Transparent;Securing device 12/27/21 1321  Dressing Status Antimicrobial disc in place;Clean, Dry, Intact 12/27/21 1321  Safety Lock Not Applicable 12/27/21 1321  Line Adjustment (NICU/IV Team Only) No 12/27/21 1321  Dressing Intervention New dressing;Other (Comment) 12/27/21 1321  Dressing Change Due 01/03/22 12/27/21 1321    Patient's spouse, Mirage Pfefferkorn, signed PICC consent.   Annett Fabian 12/27/2021, 1:25 PM

## 2021-12-27 NOTE — Procedures (Signed)
Arterial Catheter Insertion Procedure Note  Janet Hensley  [id number]  06/05/1970  Date:12/27/21  Time:6:27 PM    Provider Performing: Judithe Modest    Procedure: Insertion of Arterial Line (98921) with US guidance (19417)   Indication(s) Blood pressure monitoring and/or need for frequent ABGs  Consent Risks of the procedure as well as the alternatives and risks of each were explained to the patient and/or caregiver.  Consent for the procedure was obtained and is signed in the bedside chart  Anesthesia None   Time Out Verified patient identification, verified procedure, site/side was marked, verified correct patient position, special equipment/implants available, medications/allergies/relevant history reviewed, required imaging and test results available.   Sterile Technique Maximal sterile technique including full sterile barrier drape, hand hygiene, sterile gown, sterile gloves, mask, hair covering, sterile ultrasound probe cover (if used).   Procedure Description Area of catheter insertion was cleaned with chlorhexidine and draped in sterile fashion. With real-time ultrasound guidance an arterial catheter was placed into the right femoral artery.  Appropriate arterial tracings confirmed on monitor.     Complications/Tolerance None; patient tolerated the procedure well.   EBL Minimal   Specimen(s) None  BIOPATCH applied to the insertion site.   Harlon Ditty, AGACNP-BC [facility] Prefer epic messenger for cross cover needs If after hours, please call E-link

## 2021-12-27 NOTE — Plan of Care (Signed)
Continuing with plan of care. 

## 2021-12-27 NOTE — Progress Notes (Signed)
PHARMACY NOTE:  ANTIMICROBIAL RENAL DOSAGE ADJUSTMENT  Current antimicrobial regimen includes a mismatch between antimicrobial dosage and estimated renal function.  As per policy approved by the Pharmacy & Therapeutics and Medical Executive Committees, the antimicrobial dosage will be adjusted accordingly.  Current antimicrobial dosage:  Cefepime 2gm IV q8h  Indication: Pneumonia  Renal Function:  Estimated Creatinine Clearance: 49 mL/min (A) (by C-G formula based on SCr of 1.43 mg/dL (H)). []      On intermittent HD, scheduled: []      On CRRT    Antimicrobial dosage has been changed to:  Cefepime 2gm IV q12h  Additional comments: If Scr continue to worsen will need to adjust levofloxacin as well    Thank you for allowing pharmacy to be a part of this patient's care.  , PharmD, BCPS, BCIDP Work Cell: [phone] 12/27/2021 5:16 PM

## 2021-12-27 NOTE — Consult Note (Addendum)
NAME: Janet Hensley  DOB: 01/13/1971  MRN: [id number]  Date/Time: 12/27/2021 11:44 AM  REQUESTING PROVIDER: Dr.Chawla Subjective:  REASON FOR CONSULT: Pneumonia ?pt intubated- spoke to husband and brother at bed side and chart reviewed  Janet Hensley is a 51 y.o. with a history of asthma, chronic back pain, neck pain, trigeminal neuralgia, smoker Presented to the ED on 11/26 with progressing SOB- she had on 11/19  seen in [facility] ED for cough and resp symptoms and had neg covid and flu and was diagnosed with a viral illness . She was getting worse and lay in bed- she was having left sided chest pain and they called EMS but  she did not want to come to the hospital- on 12/22/21 EMS was called because of worsening rsob, chest pain coughing and she was brought to [facility] ED. No fever In the ED vitals showed a RR of 42  12/22/21 18:19  BP 119/69  Pulse Rate 126 !  Resp 42 (H)  SpO2 94 %  O2 Flow Rate (L/min) 3 L/min     Latest Reference Range & Units 12/22/21 18:36  WBC 4.0 - 10.5 K/uL 6.0  Hemoglobin 12.0 - 15.0 g/dL 11.3 (L)  HCT 36.0 - 46.0 % 32.7 (L)  Platelets 150 - 400 K/uL 217  Creatinine 0.44 - 1.00 mg/dL 0.83   CXR showed left lung white out- she was started on IV ceftriaxone and doxycycline (borderline  prolonged QT )  She was admitted to ICU  Because of high risk for decompensation and intubation She was intubated on 12/23/21 Bronch on 11/29 Legionella was positive in urine and sputum culture pseudomonas and stenotrophomonas and blood culture Rothia mucilaginosa 1 of 4 bottle Antibiotic changed to levaquin  and cefepime Today patient decompensated and has resp acidosis and hypercapnea I am seeing the patient for the severe pneumonia As per her husband at bed side She has used a portable hot tub every day for the past 8 months for back pain- He says they clean the tube frequently She also is on pain meds- oxy, but because of nausea when taken orally, she crushes the pill and  snorts it.  She will then flush the nose with saline if she feels it it stuck She has 3 dogs at home- no bird, fish tank- she is  not much active because of chronic neck and back pain She last went to the beach 3 months ago She has received multiple injections to the neck for pain Smoker Drinks a little alcohol No other substance    Past Medical History:  Diagnosis Date   Asthma 01/15/2012   Trigeminal neuralgia Chronic pain   Past Surgical History:  Procedure Laterality Date   LEFT HEART CATH AND CORONARY ANGIOGRAPHY N/A 12/24/2021   Procedure: LEFT HEART CATH AND CORONARY ANGIOGRAPHY;  Surgeon: Nelva Bush, MD;  Location: [facility];  Service: Cardiovascular;  Laterality: N/A;   Csection  Social History   Socioeconomic History   Marital status: Legally Separated    Spouse name: Not on file   Number of children: Not on file   Years of education: Not on file   Highest education level: Not on file  Occupational History   Not on file  Tobacco Use   Smoking status: Not on file   Smokeless tobacco: Not on file  Substance and Sexual Activity   Alcohol use: Not on file   Drug use: Not on file   Sexual activity: Not on file  Other Topics Concern   Not on file  Social History Narrative   Not on file   Social Determinants of Health   Financial Resource Strain: Not on file  Food Insecurity: Not on file  Transportation Needs: Not on file  Physical Activity: Not on file  Stress: Not on file  Social Connections: Not on file  Intimate Partner Violence: Not on file    History reviewed. No pertinent family history. No Known Allergies I? Current Facility-Administered Medications  Medication Dose Route Frequency Provider Last Rate Last Admin   0.9 %  sodium chloride infusion  250 mL Intravenous Continuous End, Harrell Gave, MD   Stopped at 12/24/21 0009   0.9 %  sodium chloride infusion  250 mL Intravenous PRN End, Harrell Gave, MD       acetaminophen (TYLENOL)  tablet 650 mg  650 mg Oral Q6H PRN End, Harrell Gave, MD       Or   acetaminophen (TYLENOL) suppository 650 mg  650 mg Rectal Q6H PRN End, Harrell Gave, MD       budesonide (PULMICORT) nebulizer solution 0.25 mg  0.25 mg Nebulization BID End, Christopher, MD   0.25 mg at 12/27/21 0758   Chlorhexidine Gluconate Cloth 2 % PADS 6 each  6 each Topical Daily End, Christopher, MD   6 each at 12/26/21 1009   docusate (COLACE) 50 MG/5ML liquid 100 mg  100 mg Per Tube BID End, Christopher, MD   100 mg at 12/27/21 0844   enoxaparin (LOVENOX) injection 40 mg  40 mg Subcutaneous Q24H Darel Hong D, NP   40 mg at 12/26/21 2133   famotidine (PEPCID) tablet 20 mg  20 mg Per Tube BID End, Christopher, MD   20 mg at 12/27/21 0844   feeding supplement (PROSource TF20) liquid 60 mL  60 mL Per Tube Daily Flora Lipps, MD       feeding supplement (VITAL AF 1.2 CAL) liquid 1,000 mL  1,000 mL Per Tube Continuous Flora Lipps, MD 50 mL/hr at 12/27/21 0415 Infusion Verify at 12/27/21 0415   fentaNYL (SUBLIMAZE) bolus via infusion 50-100 mcg  50-100 mcg Intravenous Q15 min PRN End, Harrell Gave, MD   50 mcg at 12/25/21 2232   fentaNYL 2561mg in NS 2563m(1042mml) infusion-PREMIX  0-200 mcg/hr Intravenous Continuous End, Christopher, MD 15 mL/hr at 12/27/21 0629 150 mcg/hr at 12/27/21 0629   free water 100 mL  100 mL Per Tube Q4H KeeDarel Hong NP       insulin aspart (novoLOG) injection 0-15 Units  0-15 Units Subcutaneous Q4H End, Christopher, MD   3 Units at 12/27/21 0845   ipratropium-albuterol (DUONEB) 0.5-2.5 (3) MG/3ML nebulizer solution 3 mL  3 mL Nebulization Q5 Min x 3 PRN End, Christopher, MD   3 mL at 12/22/21 2158   ipratropium-albuterol (DUONEB) 0.5-2.5 (3) MG/3ML nebulizer solution 3 mL  3 mL Nebulization Q4H End, Christopher, MD   3 mL at 12/27/21 1115   levofloxacin (LEVAQUIN) IVPB 750 mg  750 mg Intravenous Q24H ChaLockie Mola RPH 100 mL/hr at 12/27/21 0843 750 mg at 12/27/21 0843    methylPREDNISolone sodium succinate (SOLU-MEDROL) 40 mg/mL injection 20 mg  20 mg Intravenous BID End, Christopher, MD   20 mg at 12/27/21 0837   midazolam (VERSED) 100 mg/100 mL (1 mg/mL) premix infusion  0.5-10 mg/hr Intravenous Continuous KasFlora LippsD   Stopped at 12/26/21 1736   midazolam (VERSED) injection 2-4 mg  2-4 mg Intravenous Q1H PRN KasFlora LippsD   4 mg  at 12/27/21 0807   norepinephrine (LEVOPHED) 74m in 2516m(0.016 mg/mL) premix infusion  2-10 mcg/min Intravenous Titrated [facility]RPV[facility]ante [facility]     Oral care mouth rinse  15 mL Mouth Rinse Q2H End, Christopher, MD   15 mL at 12/27/21 [id number] Oral care mouth rinse  15 mL Mouth Rinse PRN End, ChHarrell GaveMD       polyethylene glycol (MIRALAX / GLYCOLAX) packet 17 g  17 g Per Tube Daily End, Christopher, MD   17 g at 12/27/21 0844   propofol (DIPRIVAN) 1000 MG/100ML infusion  5-80 mcg/kg/min Intravenous Titrated KaFlora LippsMD 15.41 mL/hr at 12/27/21 0630 30 mcg/kg/min at 12/27/21 0630   sodium chloride flush (NS) 0.9 % injection 3 mL  3 mL Intravenous Q12H End, Christopher, MD   3 mL at 12/26/21 2137   sodium chloride flush (NS) 0.9 % injection 3 mL  3 mL Intravenous Q12H End, Christopher, MD   3 mL at 12/26/21 2137   sodium chloride flush (NS) 0.9 % injection 3 mL  3 mL Intravenous PRN End, ChHarrell GaveMD       valACYclovir (VALTREX) tablet 1,000 mg  1,000 mg Per Tube BID KaFlora LippsMD   1,000 mg at 12/27/21 0844   vecuronium (NORCURON) injection 20 mg  20 mg Intravenous Q1H PRN KaFlora LippsMD   20 mg at 12/27/21 0830     Abtx:  Anti-infectives (From admission, onward)    Start     Dose/Rate Route Frequency Ordered Stop   12/27/21 1000  levofloxacin (LEVAQUIN) IVPB 750 mg        750 mg 100 mL/hr over 90 Minutes Intravenous Every 24 hours 12/26/21 1431     12/26/21 1130  valACYclovir (VALTREX) tablet 1,000 mg        1,000 mg Per Tube 2 times daily 12/26/21 1030 01/02/22 0959   12/25/21 1400  ceFEPIme (MAXIPIME) 2 g  in sodium chloride 0.9 % 100 mL IVPB        2 g 200 mL/hr over 30 Minutes Intravenous Every 8 hours 12/25/21 1230 12/26/21 2327   12/25/21 1030  azithromycin (ZITHROMAX) 500 mg in sodium chloride 0.9 % 250 mL IVPB        500 mg 250 mL/hr over 60 Minutes Intravenous Daily 12/25/21 0935 12/26/21 2010   12/24/21 1000  cefTRIAXone (ROCEPHIN) 2 g in sodium chloride 0.9 % 100 mL IVPB  Status:  Discontinued        2 g 200 mL/hr over 30 Minutes Intravenous Every 24 hours 12/23/21 1256 12/25/21 1230   12/24/21 0100  vancomycin (VANCOREADY) IVPB 1250 mg/250 mL  Status:  Discontinued        1,250 mg 166.7 mL/hr over 90 Minutes Intravenous Every 24 hours 12/23/21 0041 12/24/21 1017   12/23/21 1000  cefTRIAXone (ROCEPHIN) 1 g in sodium chloride 0.9 % 100 mL IVPB  Status:  Discontinued        1 g 200 mL/hr over 30 Minutes Intravenous Every 12 hours 12/22/21 2151 12/22/21 2352   12/23/21 1000  cefTRIAXone (ROCEPHIN) 2 g in sodium chloride 0.9 % 100 mL IVPB  Status:  Discontinued        2 g 200 mL/hr over 30 Minutes Intravenous Every 12 hours 12/22/21 2352 12/23/21 1256   12/23/21 0600  doxycycline (VIBRAMYCIN) 100 mg in sodium chloride 0.9 % 250 mL IVPB  Status:  Discontinued        100 mg 125 mL/hr over 120  Minutes Intravenous Every 12 hours 12/22/21 2151 12/22/21 2352   12/23/21 0600  doxycycline (VIBRAMYCIN) 100 mg in sodium chloride 0.9 % 250 mL IVPB  Status:  Discontinued        100 mg 125 mL/hr over 120 Minutes Intravenous Every 12 hours 12/23/21 0113 12/25/21 0935   12/23/21 0045  vancomycin (VANCOREADY) IVPB 2000 mg/400 mL        2,000 mg 200 mL/hr over 120 Minutes Intravenous  Once 12/22/21 2354 12/23/21 0359   12/22/21 1900  cefTRIAXone (ROCEPHIN) 2 g in sodium chloride 0.9 % 100 mL IVPB  Status:  Discontinued        2 g 200 mL/hr over 30 Minutes Intravenous Every 24 hours 12/22/21 1859 12/22/21 2151   12/22/21 1900  doxycycline (VIBRA-TABS) tablet 100 mg        100 mg Oral  Once 12/22/21  1859 12/22/21 1952       REVIEW OF SYSTEMS:  NA Objective:  VITALS:  BP 106/62   Pulse (!) 106   Temp 99.2 F (37.3 C) (Axillary)   Resp (!) 28   Ht _0  (1.6 m)   Wt 88.2 kg   LMP 01/16/2016   SpO2 95%   BMI 34.44 kg/m  LDA Foley Central line Other drainage tubes PHYSICAL EXAM:  General: intubated, sedated Head: Normocephalic, without obvious abnormality, atraumatic. Eyes: Conjunctivae clear, anicteric sclerae. Pupils are equal Lips - upper- vesicular break out Neck:  symmetrical, no adenopathy, thyroid: non tender no carotid bruit and no JVD. Lungs: decreased air entry left side- crepts both sides. Heart:Tachycardia Abdomen: Soft, non-tender,not distended. Bowel sounds normal. No masses Extremities: atraumatic, no cyanosis. No edema. No clubbing Skin: No rashes or lesions. Or bruising Lymph: Cervical, supraclavicular normal. Neurologic: cannot assess Lab Results CBC    Component Value Date/Time   WBC 16.9 (H) 12/27/2021 0433   RBC 3.08 (L) 12/27/2021 0433   HGB 9.6 (L) 12/27/2021 0433   HGB 12.4 02/18/2013 0627   HCT 29.7 (L) 12/27/2021 0433   HCT 37.7 02/18/2013 0627   PLT 106 (L) 12/27/2021 0433   PLT 223 02/18/2013 0627   MCV 96.4 12/27/2021 0433   MCV 99 02/18/2013 0627   MCH 31.2 12/27/2021 0433   MCHC 32.3 12/27/2021 0433   RDW 15.6 (H) 12/27/2021 0433   RDW 13.5 02/18/2013 0627   LYMPHSABS 0.6 (L) 12/22/2021 1836   LYMPHSABS 2.2 02/18/2013 0627   MONOABS 0.1 12/22/2021 1836   MONOABS 0.5 02/18/2013 0627   EOSABS 0.0 12/22/2021 1836   EOSABS 0.2 02/18/2013 0627   BASOSABS 0.0 12/22/2021 1836   BASOSABS 0.1 02/18/2013 0627       Latest Ref Rng & Units 12/27/2021    4:33 AM 12/26/2021    3:07 AM 12/25/2021    3:49 AM  CMP  Glucose 70 - 99 mg/dL 173  131  137   BUN 6 - 20 mg/dL 80  53  47   Creatinine 0.44 - 1.00 mg/dL 1.15  0.71  0.79   Sodium 135 - 145 mmol/L 148  145  137   Potassium 3.5 - 5.1 mmol/L 4.8  3.9  3.8   Chloride 98 -  111 mmol/L 119  118  111   CO2 22 - 32 mmol/L _1 Calcium 8.9 - 10.3 mg/dL 9.0  8.8  9.2       Microbiology: Recent Results (from the past 240 hour(s))  Blood Culture (routine x 2)  Status: Abnormal   Collection Time: 12/22/21  6:36 PM   Specimen: BLOOD  Result Value Ref Range Status   Specimen Description   Final    BLOOD RIGHT ANTECUBITAL Performed at [facility], [place] [address], [place], [place] [address]    Special Requests   Final    BOTTLES DRAWN AEROBIC AND ANAEROBIC Blood Culture adequate volume Performed at [facility], [address] [place], [place] [address]    Culture  Setup Time   Final    GRAM POSITIVE COCCI AEROBIC BOTTLE ONLY CRITICAL RESULT CALLED TO, READ BACK BY AND VERIFIED WITH: DEVON MITCHELLE 12/23/21 1438 MW    Culture (A)  Final    ROTHIA MUCILAGINOSA Standardized susceptibility testing for this organism is not available. Performed at [facility], [place] [address], [place], [place] [address]    Report Status 12/24/2021 FINAL  Final  Blood Culture ID Panel (Reflexed)     Status: None   Collection Time: 12/22/21  6:36 PM  Result Value Ref Range Status   Enterococcus faecalis NOT DETECTED NOT DETECTED Final   Enterococcus Faecium NOT DETECTED NOT DETECTED Final   Listeria monocytogenes NOT DETECTED NOT DETECTED Final   Staphylococcus species NOT DETECTED NOT DETECTED Final   Staphylococcus aureus (BCID) NOT DETECTED NOT DETECTED Final   Staphylococcus epidermidis NOT DETECTED NOT DETECTED Final   Staphylococcus lugdunensis NOT DETECTED NOT DETECTED Final   Streptococcus species NOT DETECTED NOT DETECTED Final   Streptococcus agalactiae NOT DETECTED NOT DETECTED Final   Streptococcus pneumoniae NOT DETECTED NOT DETECTED Final   Streptococcus pyogenes NOT DETECTED NOT DETECTED Final   A.calcoaceticus-baumannii NOT DETECTED NOT DETECTED Final   Bacteroides fragilis NOT DETECTED NOT DETECTED Final    Enterobacterales NOT DETECTED NOT DETECTED Final   Enterobacter cloacae complex NOT DETECTED NOT DETECTED Final   Escherichia coli NOT DETECTED NOT DETECTED Final   Klebsiella aerogenes NOT DETECTED NOT DETECTED Final   Klebsiella oxytoca NOT DETECTED NOT DETECTED Final   Klebsiella pneumoniae NOT DETECTED NOT DETECTED Final   Proteus species NOT DETECTED NOT DETECTED Final   Salmonella species NOT DETECTED NOT DETECTED Final   Serratia marcescens NOT DETECTED NOT DETECTED Final   Haemophilus influenzae NOT DETECTED NOT DETECTED Final   Neisseria meningitidis NOT DETECTED NOT DETECTED Final   Pseudomonas aeruginosa NOT DETECTED NOT DETECTED Final   Stenotrophomonas maltophilia NOT DETECTED NOT DETECTED Final   Candida albicans NOT DETECTED NOT DETECTED Final   Candida auris NOT DETECTED NOT DETECTED Final   Candida glabrata NOT DETECTED NOT DETECTED Final   Candida krusei NOT DETECTED NOT DETECTED Final   Candida parapsilosis NOT DETECTED NOT DETECTED Final   Candida tropicalis NOT DETECTED NOT DETECTED Final   Cryptococcus neoformans/gattii NOT DETECTED NOT DETECTED Final    Comment: Performed at [facility], [address] [place], [place] [address]  SARS Coronavirus 2 by RT PCR (hospital order, performed in [facility] hospital lab) *cepheid single result test* Anterior Nasal Swab     Status: None   Collection Time: 12/22/21  6:50 PM   Specimen: Anterior Nasal Swab  Result Value Ref Range Status   SARS Coronavirus 2 by RT PCR NEGATIVE NEGATIVE Final    Comment: (NOTE) SARS-CoV-2 target nucleic acids are NOT DETECTED.  The SARS-CoV-2 RNA is generally detectable in upper and lower respiratory specimens during the acute phase of infection. The lowest concentration of SARS-CoV-2 viral copies this assay can detect is 250 copies /  mL. A negative result does not preclude SARS-CoV-2 infection and should not be used as the sole basis for treatment or other patient management  decisions.  A negative result may occur with improper specimen collection / handling, submission of specimen other than nasopharyngeal swab, presence of viral mutation(s) within the areas targeted by this assay, and inadequate number of viral copies (<250 copies / mL). A negative result must be combined with clinical observations, patient history, and epidemiological information.  Fact Sheet for Patients:   https://www.patel.info/  Fact Sheet for Healthcare Providers: https://hall.com/  This test is not yet approved or  cleared by the [place] FDA and has been authorized for detection and/or diagnosis of SARS-CoV-2 by FDA under an Emergency Use Authorization (EUA).  This EUA will remain in effect (meaning this test can be used) for the duration of the COVID-19 declaration under Section 564(b)(1) of the Act, 21 U.S.C. section 360bbb-3(b)(1), unless the authorization is terminated or revoked sooner.  Performed at [facility], [address] [place], [place] [address]   MRSA Next Gen by PCR, Nasal     Status: None   Collection Time: 12/22/21 10:55 PM   Specimen: Nasal Mucosa; Nasal Swab  Result Value Ref Range Status   MRSA by PCR Next Gen NOT DETECTED NOT DETECTED Final    Comment: (NOTE) The GeneXpert MRSA Assay (FDA approved for NASAL specimens only), is one component of a comprehensive MRSA colonization surveillance program. It is not intended to diagnose MRSA infection nor to guide or monitor treatment for MRSA infections. Test performance is not FDA approved in patients less than 42 years old. Performed at [facility], [address] [place], [place] [address]   Blood Culture (routine x 2)     Status: None   Collection Time: 12/22/21 11:15 PM   Specimen: BLOOD LEFT HAND  Result Value Ref Range Status   Specimen Description BLOOD LEFT HAND  Final   Special Requests IN PEDIATRIC BOTTLE Blood Culture  adequate volume  Final   Culture   Final    NO GROWTH 5 DAYS Performed at [facility], [address] [place], [place] [address]    Report Status 12/27/2021 FINAL  Final  Respiratory (~20 pathogens) panel by PCR     Status: None   Collection Time: 12/23/21 12:01 AM  Result Value Ref Range Status   Adenovirus NOT DETECTED NOT DETECTED Final   Coronavirus 229E NOT DETECTED NOT DETECTED Final    Comment: (NOTE) The Coronavirus on the Respiratory Panel, DOES NOT test for the novel  Coronavirus (2019 nCoV)    Coronavirus HKU1 NOT DETECTED NOT DETECTED Final   Coronavirus NL63 NOT DETECTED NOT DETECTED Final   Coronavirus OC43 NOT DETECTED NOT DETECTED Final   Metapneumovirus NOT DETECTED NOT DETECTED Final   Rhinovirus / Enterovirus NOT DETECTED NOT DETECTED Final   Influenza A NOT DETECTED NOT DETECTED Final   Influenza B NOT DETECTED NOT DETECTED Final   Parainfluenza Virus 1 NOT DETECTED NOT DETECTED Final   Parainfluenza Virus 2 NOT DETECTED NOT DETECTED Final   Parainfluenza Virus 3 NOT DETECTED NOT DETECTED Final   Parainfluenza Virus 4 NOT DETECTED NOT DETECTED Final   Respiratory Syncytial Virus NOT DETECTED NOT DETECTED Final   Bordetella pertussis NOT DETECTED NOT DETECTED Final   Bordetella Parapertussis NOT DETECTED NOT DETECTED Final   Chlamydophila pneumoniae NOT DETECTED NOT DETECTED Final   Mycoplasma pneumoniae NOT DETECTED NOT DETECTED Final    Comment: Performed  at [facility], [place] [address] [place], [place] [address]  Urine Culture     Status: None   Collection Time: 12/23/21  4:11 AM   Specimen: In/Out Cath Urine  Result Value Ref Range Status   Specimen Description   Final    IN/OUT CATH URINE Performed at [facility], [address], [place], [place] [address]    Special Requests   Final    NONE Performed at [facility], [address] [place], [place] [address]    Culture   Final    NO GROWTH Performed at  [facility], [place] [address], [place], [place] [address]    Report Status 12/24/2021 FINAL  Final  Culture, Respiratory w Gram Stain     Status: None   Collection Time: 12/23/21  1:32 PM   Specimen: Tracheal Aspirate; Respiratory  Result Value Ref Range Status   Specimen Description   Final    TRACHEAL ASPIRATE Performed at [facility], [address], [place], [place] [address]    Special Requests   Final    NONE Performed at [facility], [address] [place], [place] [address]    Gram Stain   Final    NO WBC SEEN NO ORGANISMS SEEN Performed at [facility], [place] [address] [place], [place] [address]    Culture   Final    RARE PSEUDOMONAS AERUGINOSA RARE STENOTROPHOMONAS MALTOPHILIA    Report Status 12/27/2021 FINAL  Final   Organism ID, Bacteria PSEUDOMONAS AERUGINOSA  Final   Organism ID, Bacteria STENOTROPHOMONAS MALTOPHILIA  Final      Susceptibility   Pseudomonas aeruginosa - MIC*    CEFTAZIDIME 4 SENSITIVE Sensitive     CIPROFLOXACIN <=0.25 SENSITIVE Sensitive     GENTAMICIN <=1 SENSITIVE Sensitive     IMIPENEM 1 SENSITIVE Sensitive     PIP/TAZO 8 SENSITIVE Sensitive     CEFEPIME 2 SENSITIVE Sensitive     * RARE PSEUDOMONAS AERUGINOSA   Stenotrophomonas maltophilia - MIC*    LEVOFLOXACIN 0.25 SENSITIVE Sensitive     TRIMETH/SULFA <=20 SENSITIVE Sensitive     * RARE STENOTROPHOMONAS MALTOPHILIA  Culture, BAL-quantitative w Gram Stain     Status: Abnormal   Collection Time: 12/25/21 11:46 AM   Specimen: Bronchoalveolar Lavage; Respiratory  Result Value Ref Range Status   Specimen Description   Final    BRONCHIAL ALVEOLAR LAVAGE Performed at [facility], [address] [place], [place] [address]    Special Requests   Final    Normal Performed at [facility], [address] [place], [place] [address]    Gram Stain   Final    NO WBC SEEN MODERATE GRAM NEGATIVE RODS Performed at [facility], [place] [address] [place], [place] [address]    Culture (A)  Final    >=100,000 COLONIES/mL PSEUDOMONAS AERUGINOSA 80,000 COLONIES/mL STENOTROPHOMONAS MALTOPHILIA    Report Status 12/27/2021 FINAL  Final   Organism ID, Bacteria PSEUDOMONAS AERUGINOSA (A)  Final   Organism ID, Bacteria STENOTROPHOMONAS MALTOPHILIA (A)  Final      Susceptibility   Pseudomonas aeruginosa - MIC*    CEFTAZIDIME 4 SENSITIVE Sensitive     CIPROFLOXACIN <=0.25 SENSITIVE Sensitive     GENTAMICIN 2 SENSITIVE Sensitive     IMIPENEM 1 SENSITIVE Sensitive     PIP/TAZO 8 SENSITIVE Sensitive     CEFEPIME 2 SENSITIVE Sensitive     * >=100,000 COLONIES/mL PSEUDOMONAS  AERUGINOSA   Stenotrophomonas maltophilia - MIC*    LEVOFLOXACIN 0.25 SENSITIVE Sensitive     TRIMETH/SULFA <=20 SENSITIVE Sensitive     * 80,000 COLONIES/mL STENOTROPHOMONAS MALTOPHILIA  Legionella Pneumophila/Culture     Status: None (Preliminary result)   Collection Time: 12/25/21 11:46 AM   Specimen: Bronchial Alveolar Lavage; Respiratory  Result Value Ref Range Status   Legionella Pneumophila DFA Negative Negative Final    Comment: (NOTE) Performed At: [facility] [place], [place] [id number] Rush Farmer MD GG:[id number]    Legionella Species Culture PENDING  Incomplete   Source, Legionella Cul BRONCHIAL ALVEOLAR LAVAGE  Final    Comment: Performed at [facility], [address] [place], [place] [address]    IMAGING RESULTS:  I have personally reviewed the films ?complete opacification of left lung  Impression/Recommendation ?Severe community acquired pneumonia with left lung white out due to consolidation from  legionella, strenotrophomonas and pseudomonas and post obstructive changes due to mucous plugs 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 She has used steroids in the past but none recently  She is currently on levaquin which treats all three organisms- will add cefepime for dual pseudomonas coverage and minocycline for dual steno coverage- will also give nebulized Aminoglycoside Will check immunoglobulins- may investigate for CGD Hiv NR  Severe acute hypoxic and hypercapnic resp failure- intubated  Severe sepsis  Inferior STEMI  Mild upward trend in cr- observe closely ? ___________________________________________________ Discussed with family, intensivist, pharmacist and nurse [facility] on call this weekend- available by phone for any urgent questions Note:  This document was prepared using Dragon voice recognition software and may include unintentional dictation errors.

## 2021-12-27 NOTE — Progress Notes (Signed)
At approximately 1700, NP obtained consent for arterial line placement, time out was performed and NP placed arterial line to the right femoral, in-line safety set in place and B/P monitoring via art line.  Patient tolerated procedure well.

## 2021-12-27 NOTE — Progress Notes (Signed)
At 1245 PICC nurse at bedside, after obtaining consent from husband and time out called, and placed PICC of which patient tolerated well.  Two PIVs removed and drips changed to PICC along with in-line safety set placed.

## 2021-12-28 ENCOUNTER — Inpatient Hospital Stay (HOSPITAL_COMMUNITY): Payer: 59

## 2021-12-28 ENCOUNTER — Encounter (HOSPITAL_COMMUNITY): Payer: Self-pay

## 2021-12-28 ENCOUNTER — Inpatient Hospital Stay (HOSPITAL_COMMUNITY)
Admission: RE | Admit: 2021-12-28 | Discharge: 2022-01-08 | DRG: 004 | Disposition: A | Discharge status: Other Acute Inpatient Hospital | Payer: 59 | Attending: Internal Medicine | Admitting: Internal Medicine

## 2021-12-28 ENCOUNTER — Inpatient Hospital Stay: Payer: 59

## 2021-12-28 DIAGNOSIS — G500 Trigeminal neuralgia: Secondary | ICD-10-CM | POA: Diagnosis present

## 2021-12-28 DIAGNOSIS — J9690 Respiratory failure, unspecified, unspecified whether with hypoxia or hypercapnia: Secondary | ICD-10-CM | POA: Diagnosis not present

## 2021-12-28 DIAGNOSIS — N170 Acute kidney failure with tubular necrosis: Secondary | ICD-10-CM | POA: Diagnosis not present

## 2021-12-28 DIAGNOSIS — D638 Anemia in other chronic diseases classified elsewhere: Secondary | ICD-10-CM | POA: Diagnosis present

## 2021-12-28 DIAGNOSIS — R739 Hyperglycemia, unspecified: Secondary | ICD-10-CM | POA: Diagnosis present

## 2021-12-28 DIAGNOSIS — J1569 Pneumonia due to other gram-negative bacteria: Secondary | ICD-10-CM | POA: Diagnosis not present

## 2021-12-28 DIAGNOSIS — J151 Pneumonia due to Pseudomonas: Secondary | ICD-10-CM | POA: Diagnosis not present

## 2021-12-28 DIAGNOSIS — Z6832 Body mass index (BMI) 32.0-32.9, adult: Secondary | ICD-10-CM | POA: Diagnosis not present

## 2021-12-28 DIAGNOSIS — J9621 Acute and chronic respiratory failure with hypoxia: Secondary | ICD-10-CM

## 2021-12-28 DIAGNOSIS — Z1152 Encounter for screening for COVID-19: Secondary | ICD-10-CM

## 2021-12-28 DIAGNOSIS — Z930 Tracheostomy status: Secondary | ICD-10-CM

## 2021-12-28 DIAGNOSIS — Z79891 Long term (current) use of opiate analgesic: Secondary | ICD-10-CM

## 2021-12-28 DIAGNOSIS — B001 Herpesviral vesicular dermatitis: Secondary | ICD-10-CM | POA: Diagnosis not present

## 2021-12-28 DIAGNOSIS — N179 Acute kidney failure, unspecified: Secondary | ICD-10-CM | POA: Diagnosis not present

## 2021-12-28 DIAGNOSIS — E8770 Fluid overload, unspecified: Secondary | ICD-10-CM | POA: Diagnosis present

## 2021-12-28 DIAGNOSIS — E669 Obesity, unspecified: Secondary | ICD-10-CM | POA: Diagnosis present

## 2021-12-28 DIAGNOSIS — J80 Acute respiratory distress syndrome: Secondary | ICD-10-CM | POA: Diagnosis present

## 2021-12-28 DIAGNOSIS — Z9911 Dependence on respirator [ventilator] status: Secondary | ICD-10-CM | POA: Diagnosis not present

## 2021-12-28 DIAGNOSIS — E876 Hypokalemia: Secondary | ICD-10-CM | POA: Diagnosis not present

## 2021-12-28 DIAGNOSIS — Z4682 Encounter for fitting and adjustment of non-vascular catheter: Secondary | ICD-10-CM | POA: Diagnosis not present

## 2021-12-28 DIAGNOSIS — J9602 Acute respiratory failure with hypercapnia: Secondary | ICD-10-CM | POA: Diagnosis not present

## 2021-12-28 DIAGNOSIS — E870 Hyperosmolality and hypernatremia: Secondary | ICD-10-CM | POA: Diagnosis not present

## 2021-12-28 DIAGNOSIS — R7303 Prediabetes: Secondary | ICD-10-CM | POA: Diagnosis present

## 2021-12-28 DIAGNOSIS — Z7951 Long term (current) use of inhaled steroids: Secondary | ICD-10-CM

## 2021-12-28 DIAGNOSIS — T402X5A Adverse effect of other opioids, initial encounter: Secondary | ICD-10-CM | POA: Diagnosis present

## 2021-12-28 DIAGNOSIS — E875 Hyperkalemia: Secondary | ICD-10-CM | POA: Diagnosis present

## 2021-12-28 DIAGNOSIS — I2510 Atherosclerotic heart disease of native coronary artery without angina pectoris: Secondary | ICD-10-CM | POA: Diagnosis present

## 2021-12-28 DIAGNOSIS — E871 Hypo-osmolality and hyponatremia: Secondary | ICD-10-CM | POA: Diagnosis not present

## 2021-12-28 DIAGNOSIS — R6521 Severe sepsis with septic shock: Secondary | ICD-10-CM | POA: Diagnosis present

## 2021-12-28 DIAGNOSIS — A419 Sepsis, unspecified organism: Principal | ICD-10-CM | POA: Diagnosis present

## 2021-12-28 DIAGNOSIS — K6389 Other specified diseases of intestine: Secondary | ICD-10-CM | POA: Diagnosis not present

## 2021-12-28 DIAGNOSIS — M549 Dorsalgia, unspecified: Secondary | ICD-10-CM | POA: Diagnosis present

## 2021-12-28 DIAGNOSIS — Z635 Disruption of family by separation and divorce: Secondary | ICD-10-CM

## 2021-12-28 DIAGNOSIS — R0603 Acute respiratory distress: Secondary | ICD-10-CM | POA: Diagnosis not present

## 2021-12-28 DIAGNOSIS — F17200 Nicotine dependence, unspecified, uncomplicated: Secondary | ICD-10-CM | POA: Diagnosis present

## 2021-12-28 DIAGNOSIS — K5903 Drug induced constipation: Secondary | ICD-10-CM | POA: Diagnosis present

## 2021-12-28 DIAGNOSIS — G8929 Other chronic pain: Secondary | ICD-10-CM | POA: Diagnosis present

## 2021-12-28 DIAGNOSIS — G894 Chronic pain syndrome: Secondary | ICD-10-CM | POA: Diagnosis not present

## 2021-12-28 DIAGNOSIS — Z79899 Other long term (current) drug therapy: Secondary | ICD-10-CM

## 2021-12-28 DIAGNOSIS — Z792 Long term (current) use of antibiotics: Secondary | ICD-10-CM

## 2021-12-28 DIAGNOSIS — J189 Pneumonia, unspecified organism: Secondary | ICD-10-CM | POA: Diagnosis not present

## 2021-12-28 DIAGNOSIS — D6959 Other secondary thrombocytopenia: Secondary | ICD-10-CM | POA: Diagnosis not present

## 2021-12-28 DIAGNOSIS — J9610 Chronic respiratory failure, unspecified whether with hypoxia or hypercapnia: Secondary | ICD-10-CM

## 2021-12-28 DIAGNOSIS — J9601 Acute respiratory failure with hypoxia: Secondary | ICD-10-CM | POA: Diagnosis not present

## 2021-12-28 DIAGNOSIS — R918 Other nonspecific abnormal finding of lung field: Secondary | ICD-10-CM | POA: Diagnosis not present

## 2021-12-28 DIAGNOSIS — J90 Pleural effusion, not elsewhere classified: Secondary | ICD-10-CM | POA: Diagnosis not present

## 2021-12-28 LAB — PROCALCITONIN: Procalcitonin: 3.67 ng/mL

## 2021-12-28 LAB — PHOSPHORUS: Phosphorus: 5.9 mg/dL — ABNORMAL HIGH (ref 2.5–4.6)

## 2021-12-28 LAB — COMPREHENSIVE METABOLIC PANEL
ALT: 46 U/L — ABNORMAL HIGH (ref 0–44)
AST: 51 U/L — ABNORMAL HIGH (ref 15–41)
Albumin: <1.5 g/dL — ABNORMAL LOW (ref 3.5–5.0)
Alkaline Phosphatase: 105 U/L (ref 38–126)
Anion gap: 6 (ref 5–15)
BUN: 106 mg/dL — ABNORMAL HIGH (ref 6–20)
CO2: 25 mmol/L (ref 22–32)
Calcium: 8.2 mg/dL — ABNORMAL LOW (ref 8.9–10.3)
Chloride: 114 mmol/L — ABNORMAL HIGH (ref 98–111)
Creatinine, Ser: 2.29 mg/dL — ABNORMAL HIGH (ref 0.44–1.00)
GFR, Estimated: 25 mL/min — ABNORMAL LOW (ref >60)
Glucose, Bld: 166 mg/dL — ABNORMAL HIGH (ref 70–99)
Potassium: 6.1 mmol/L — ABNORMAL HIGH (ref 3.5–5.1)
Sodium: 145 mmol/L (ref 135–145)
Total Bilirubin: 0.3 mg/dL (ref 0.3–1.2)
Total Protein: 5.4 g/dL — ABNORMAL LOW (ref 6.5–8.1)

## 2021-12-28 LAB — CBC
HCT: 27.8 % — ABNORMAL LOW (ref 36.0–46.0)
Hemoglobin: 8.8 g/dL — ABNORMAL LOW (ref 12.0–15.0)
MCH: 32 pg (ref 26.0–34.0)
MCHC: 31.7 g/dL (ref 30.0–36.0)
MCV: 101.1 fL — ABNORMAL HIGH (ref 80.0–100.0)
Platelets: 90 10*3/uL — ABNORMAL LOW (ref 150–400)
RBC: 2.75 MIL/uL — ABNORMAL LOW (ref 3.87–5.11)
RDW: 16.7 % — ABNORMAL HIGH (ref 11.5–15.5)
WBC: 23.4 10*3/uL — ABNORMAL HIGH (ref 4.0–10.5)
nRBC: 0.3 % — ABNORMAL HIGH (ref 0.0–0.2)

## 2021-12-28 LAB — BLOOD GAS, ARTERIAL
Acid-base deficit: 1.9 mmol/L (ref 0.0–2.0)
Bicarbonate: 25.5 mmol/L (ref 20.0–28.0)
FIO2: 80 %
MECHVT: 380 mL
Mechanical Rate: 26
O2 Saturation: 94.5 %
PEEP: 10 cmH2O
Patient temperature: 37
pCO2 arterial: 53 mmHg — ABNORMAL HIGH (ref 32–48)
pH, Arterial: 7.29 — ABNORMAL LOW (ref 7.35–7.45)
pO2, Arterial: 71 mmHg — ABNORMAL LOW (ref 83–108)

## 2021-12-28 LAB — CBC WITH DIFFERENTIAL/PLATELET
Abs Immature Granulocytes: 0 10*3/uL (ref 0.00–0.07)
Basophils Absolute: 0 10*3/uL (ref 0.0–0.1)
Basophils Relative: 0 %
Eosinophils Absolute: 0 10*3/uL (ref 0.0–0.5)
Eosinophils Relative: 0 %
HCT: 30.8 % — ABNORMAL LOW (ref 36.0–46.0)
Hemoglobin: 9.6 g/dL — ABNORMAL LOW (ref 12.0–15.0)
Lymphocytes Relative: 3 %
Lymphs Abs: 0.7 10*3/uL (ref 0.7–4.0)
MCH: 31.9 pg (ref 26.0–34.0)
MCHC: 31.2 g/dL (ref 30.0–36.0)
MCV: 102.3 fL — ABNORMAL HIGH (ref 80.0–100.0)
Monocytes Absolute: 0.4 10*3/uL (ref 0.1–1.0)
Monocytes Relative: 2 %
Neutro Abs: 20.7 10*3/uL — ABNORMAL HIGH (ref 1.7–7.7)
Neutrophils Relative %: 95 %
Platelets: 99 10*3/uL — ABNORMAL LOW (ref 150–400)
RBC: 3.01 MIL/uL — ABNORMAL LOW (ref 3.87–5.11)
RDW: 16 % — ABNORMAL HIGH (ref 11.5–15.5)
WBC: 21.8 10*3/uL — ABNORMAL HIGH (ref 4.0–10.5)
nRBC: 0.4 % — ABNORMAL HIGH (ref 0.0–0.2)

## 2021-12-28 LAB — GLUCOSE, CAPILLARY
Glucose-Capillary: 137 mg/dL — ABNORMAL HIGH (ref 70–99)
Glucose-Capillary: 138 mg/dL — ABNORMAL HIGH (ref 70–99)
Glucose-Capillary: 170 mg/dL — ABNORMAL HIGH (ref 70–99)
Glucose-Capillary: 182 mg/dL — ABNORMAL HIGH (ref 70–99)
Glucose-Capillary: 209 mg/dL — ABNORMAL HIGH (ref 70–99)
Glucose-Capillary: 218 mg/dL — ABNORMAL HIGH (ref 70–99)

## 2021-12-28 LAB — BASIC METABOLIC PANEL
Anion gap: 7 (ref 5–15)
Anion gap: 6 (ref 5–15)
BUN: 88 mg/dL — ABNORMAL HIGH (ref 6–20)
BUN: 114 mg/dL — ABNORMAL HIGH (ref 6–20)
CO2: 28 mmol/L (ref 22–32)
CO2: 25 mmol/L (ref 22–32)
Calcium: 7.3 mg/dL — ABNORMAL LOW (ref 8.9–10.3)
Calcium: 8.1 mg/dL — ABNORMAL LOW (ref 8.9–10.3)
Chloride: 108 mmol/L (ref 98–111)
Chloride: 114 mmol/L — ABNORMAL HIGH (ref 98–111)
Creatinine, Ser: 1.7 mg/dL — ABNORMAL HIGH (ref 0.44–1.00)
Creatinine, Ser: 2.43 mg/dL — ABNORMAL HIGH (ref 0.44–1.00)
GFR, Estimated: 36 mL/min — ABNORMAL LOW (ref >60)
GFR, Estimated: 24 mL/min — ABNORMAL LOW (ref >60)
Glucose, Bld: 428 mg/dL — ABNORMAL HIGH (ref 70–99)
Glucose, Bld: 149 mg/dL — ABNORMAL HIGH (ref 70–99)
Potassium: 4.9 mmol/L (ref 3.5–5.1)
Potassium: 6.3 mmol/L (ref 3.5–5.1)
Sodium: 143 mmol/L (ref 135–145)
Sodium: 145 mmol/L (ref 135–145)

## 2021-12-28 LAB — MAGNESIUM: Magnesium: 2.4 mg/dL (ref 1.7–2.4)

## 2021-12-28 LAB — HEPATIC FUNCTION PANEL
ALT: 39 U/L (ref 0–44)
AST: 62 U/L — ABNORMAL HIGH (ref 15–41)
Albumin: <1.5 g/dL — ABNORMAL LOW (ref 3.5–5.0)
Alkaline Phosphatase: 108 U/L (ref 38–126)
Bilirubin, Direct: 0.2 mg/dL (ref 0.0–0.2)
Indirect Bilirubin: 0.8 mg/dL (ref 0.3–0.9)
Total Bilirubin: 1 mg/dL (ref 0.3–1.2)
Total Protein: 5.1 g/dL — ABNORMAL LOW (ref 6.5–8.1)

## 2021-12-28 LAB — TRIGLYCERIDES: Triglycerides: 619 mg/dL — ABNORMAL HIGH (ref <150)

## 2021-12-28 MED ORDER — MIDAZOLAM-SODIUM CHLORIDE 100-0.9 MG/100ML-% IV SOLN
0.0000 mg/h | INTRAVENOUS | Status: DC
  Administered 2021-12-29 – 2021-12-30 (×2): 5 mg/h via INTRAVENOUS
  Administered 2021-12-31 – 2022-01-01 (×2): 6 mg/h via INTRAVENOUS
  Administered 2022-01-01 – 2022-01-02 (×2): 5 mg/h via INTRAVENOUS
  Filled 2021-12-28 (×7): qty 100

## 2021-12-28 MED ORDER — SODIUM CHLORIDE 0.45 % IV SOLN
INTRAVENOUS | Status: DC
  Filled 2021-12-28 (×2): qty 75

## 2021-12-28 MED ORDER — POLYETHYLENE GLYCOL 3350 17 G PO PACK: 17.0000 g | PACK | Freq: Every day | ORAL | 0 refills | Status: DC

## 2021-12-28 MED ORDER — HEPARIN SODIUM (PORCINE) 5000 UNIT/ML IJ SOLN
5000.0000 [IU] | Freq: Three times a day (TID) | INTRAMUSCULAR | Status: DC
  Administered 2021-12-28 – 2022-01-08 (×32): 5000 [IU] via SUBCUTANEOUS
  Filled 2021-12-28 (×32): qty 1

## 2021-12-28 MED ORDER — IPRATROPIUM-ALBUTEROL 0.5-2.5 (3) MG/3ML IN SOLN: 3.0000 mL | RESPIRATORY_TRACT | 1 refills | Status: DC

## 2021-12-28 MED ORDER — FENTANYL BOLUS VIA INFUSION: 50.0000 ug | INTRAVENOUS | 0 refills | Status: DC | PRN

## 2021-12-28 MED ORDER — SODIUM CHLORIDE 0.9 % IV SOLN
0.0000 ug/kg/min | INTRAVENOUS | Status: DC
  Administered 2021-12-28: 3 ug/kg/min via INTRAVENOUS
  Administered 2021-12-29 – 2021-12-30 (×3): 2 ug/kg/min via INTRAVENOUS
  Administered 2021-12-31: 2.5 ug/kg/min via INTRAVENOUS
  Filled 2021-12-28 (×6): qty 20

## 2021-12-28 MED ORDER — POLYETHYLENE GLYCOL 3350 17 G PO PACK
17.0000 g | PACK | Freq: Every day | ORAL | Status: DC | PRN
  Administered 2022-01-06: 17 g
  Filled 2021-12-28: qty 1

## 2021-12-28 MED ORDER — IPRATROPIUM-ALBUTEROL 0.5-2.5 (3) MG/3ML IN SOLN: 3.0000 mL | RESPIRATORY_TRACT | 1 refills | Status: DC | PRN

## 2021-12-28 MED ORDER — MINOCYCLINE HCL 100 MG PO CAPS: 200.0000 mg | ORAL_CAPSULE | Freq: Two times a day (BID) | ORAL | 0 refills | Status: DC

## 2021-12-28 MED ORDER — FENTANYL BOLUS VIA INFUSION
50.0000 ug | INTRAVENOUS | Status: DC | PRN
  Administered 2021-12-31 – 2022-01-01 (×4): 100 ug via INTRAVENOUS
  Administered 2022-01-01 – 2022-01-02 (×2): 50 ug via INTRAVENOUS

## 2021-12-28 MED ORDER — TOBRAMYCIN 300 MG/5ML IN NEBU: 300.0000 mg | INHALATION_SOLUTION | Freq: Two times a day (BID) | RESPIRATORY_TRACT | 0 refills | Status: DC

## 2021-12-28 MED ORDER — FENTANYL 2500MCG IN NS 250ML (10MCG/ML) PREMIX INFUSION
0.0000 ug/h | INTRAVENOUS | Status: DC
  Administered 2021-12-28: 175 ug/h via INTRAVENOUS
  Administered 2021-12-29 – 2021-12-30 (×3): 150 ug/h via INTRAVENOUS
  Administered 2021-12-31: 200 ug/h via INTRAVENOUS
  Administered 2021-12-31: 150 ug/h via INTRAVENOUS
  Administered 2022-01-01: 300 ug/h via INTRAVENOUS
  Administered 2022-01-01: 200 ug/h via INTRAVENOUS
  Administered 2022-01-01 – 2022-01-03 (×4): 300 ug/h via INTRAVENOUS
  Administered 2022-01-03: 100 ug/h via INTRAVENOUS
  Administered 2022-01-05: 50 ug/h via INTRAVENOUS
  Filled 2021-12-28 (×13): qty 250

## 2021-12-28 MED ORDER — MIDAZOLAM HCL 2 MG/2ML IJ SOLN: 1.0000 mg | INTRAMUSCULAR | Status: DC | PRN

## 2021-12-28 MED ORDER — VALACYCLOVIR HCL 1 G PO TABS: 1000.0000 mg | ORAL_TABLET | Freq: Two times a day (BID) | ORAL | 0 refills | Status: DC

## 2021-12-28 MED ORDER — SODIUM CHLORIDE 0.9 % IV SOLN
2.0000 g | Freq: Two times a day (BID) | INTRAVENOUS | Status: DC
  Administered 2021-12-28 – 2021-12-29 (×3): 2 g via INTRAVENOUS
  Filled 2021-12-28 (×3): qty 12.5

## 2021-12-28 MED ORDER — PROSOURCE TF20 ENFIT COMPATIBL EN LIQD: 60.0000 mL | Freq: Every day | ENTERAL | 0 refills | Status: DC

## 2021-12-28 MED ORDER — LEVOFLOXACIN IN D5W 750 MG/150ML IV SOLN: 750.0000 mg | INTRAVENOUS | 0 refills | Status: DC

## 2021-12-28 MED ORDER — BUDESONIDE 0.25 MG/2ML IN SUSP: 0.2500 mg | Freq: Two times a day (BID) | RESPIRATORY_TRACT | 12 refills | Status: DC

## 2021-12-28 MED ORDER — DOCUSATE SODIUM 50 MG/5ML PO LIQD: 100.0000 mg | Freq: Two times a day (BID) | ORAL | Status: DC | PRN

## 2021-12-28 MED ORDER — DOCUSATE SODIUM 50 MG/5ML PO LIQD: 100.0000 mg | Freq: Two times a day (BID) | ORAL | 0 refills | Status: DC

## 2021-12-28 MED ORDER — SODIUM CHLORIDE 0.9 % IV SOLN: 250.0000 mL | INTRAVENOUS | 0 refills | Status: DC | PRN

## 2021-12-28 MED ORDER — IPRATROPIUM-ALBUTEROL 0.5-2.5 (3) MG/3ML IN SOLN
3.0000 mL | Freq: Four times a day (QID) | RESPIRATORY_TRACT | Status: DC
  Administered 2021-12-28 – 2022-01-06 (×32): 3 mL via RESPIRATORY_TRACT
  Filled 2021-12-28 (×32): qty 3

## 2021-12-28 MED ORDER — MIDAZOLAM HCL 2 MG/2ML IJ SOLN: 2.0000 mg | INTRAMUSCULAR | 0 refills | Status: DC | PRN

## 2021-12-28 MED ORDER — ACETAMINOPHEN 325 MG PO TABS: 650.0000 mg | ORAL_TABLET | Freq: Four times a day (QID) | ORAL | 0 refills | Status: DC | PRN

## 2021-12-28 MED ORDER — PHENYLEPHRINE HCL-NACL 20-0.9 MG/250ML-% IV SOLN
INTRAVENOUS | Status: AC
  Filled 2021-12-28: qty 250

## 2021-12-28 MED ORDER — VALACYCLOVIR 50 MG/ML ORAL SUSPENSION: 1000.0000 mg | Freq: Two times a day (BID) | ORAL | Status: DC

## 2021-12-28 MED ORDER — PHENYLEPHRINE HCL-NACL 20-0.9 MG/250ML-% IV SOLN
0.0000 ug/min | INTRAVENOUS | Status: DC
  Administered 2021-12-28: 10 ug/min via INTRAVENOUS

## 2021-12-28 MED ORDER — CISATRACURIUM BESYLATE 20 MG/10ML IV SOLN
0.1000 mg/kg | INTRAVENOUS | Status: DC | PRN
  Filled 2021-12-28: qty 10

## 2021-12-28 MED ORDER — MINOCYCLINE HCL 50 MG PO CAPS
100.0000 mg | ORAL_CAPSULE | Freq: Two times a day (BID) | ORAL | Status: DC
  Administered 2021-12-29 (×2): 100 mg
  Filled 2021-12-28 (×2): qty 2
  Filled 2021-12-28: qty 1
  Filled 2021-12-28: qty 2

## 2021-12-28 MED ORDER — ACETAMINOPHEN 160 MG/5ML PO SOLN: 650.0000 mg | ORAL | Status: DC | PRN

## 2021-12-28 MED ORDER — FENTANYL CITRATE PF 50 MCG/ML IJ SOSY
50.0000 ug | PREFILLED_SYRINGE | Freq: Once | INTRAMUSCULAR | Status: AC
  Administered 2021-12-28: 50 ug via INTRAVENOUS

## 2021-12-28 MED ORDER — ORAL CARE MOUTH RINSE: 15.0000 mL | OROMUCOSAL | Status: DC | PRN

## 2021-12-28 MED ORDER — LEVOFLOXACIN IN D5W 750 MG/150ML IV SOLN: 750.0000 mg | INTRAVENOUS | Status: DC

## 2021-12-28 MED ORDER — FUROSEMIDE 10 MG/ML IJ SOLN: 40.0000 mg | Freq: Once | INTRAMUSCULAR | Status: DC

## 2021-12-28 MED ORDER — ACETAMINOPHEN 650 MG RE SUPP: 650.0000 mg | Freq: Four times a day (QID) | RECTAL | 0 refills | Status: DC | PRN

## 2021-12-28 MED ORDER — METHYLPREDNISOLONE SODIUM SUCC 40 MG IJ SOLR: 20.0000 mg | Freq: Two times a day (BID) | INTRAMUSCULAR | 0 refills | Status: DC

## 2021-12-28 MED ORDER — ROCURONIUM BROMIDE 10 MG/ML (PF) SYRINGE
PREFILLED_SYRINGE | INTRAVENOUS | Status: AC
  Administered 2021-12-28: 100 mg
  Filled 2021-12-28: qty 10

## 2021-12-28 MED ORDER — SODIUM CHLORIDE 0.45 % IV SOLN: 1.0000 mL | INTRAVENOUS | 0 refills | Status: DC

## 2021-12-28 MED ORDER — INSULIN ASPART 100 UNIT/ML IJ SOLN: 0.0000 [IU] | INTRAMUSCULAR | 11 refills | Status: DC

## 2021-12-28 MED ORDER — SODIUM CHLORIDE 0.9 % IV SOLN: 250.0000 mL | INTRAVENOUS | 0 refills | Status: DC

## 2021-12-28 MED ORDER — DOCUSATE SODIUM 50 MG/5ML PO LIQD
100.0000 mg | Freq: Two times a day (BID) | ORAL | Status: DC
  Administered 2021-12-28 – 2022-01-08 (×16): 100 mg
  Filled 2021-12-28 (×17): qty 10

## 2021-12-28 MED ORDER — FENTANYL 2500MCG IN NS 250ML (10MCG/ML) PREMIX INFUSION: 0.0000 ug/h | INTRAVENOUS | 0 refills | Status: DC

## 2021-12-28 MED ORDER — METHYLPREDNISOLONE SODIUM SUCC 40 MG IJ SOLR
40.0000 mg | Freq: Two times a day (BID) | INTRAMUSCULAR | Status: DC
  Administered 2021-12-28 – 2022-01-02 (×10): 40 mg via INTRAVENOUS
  Filled 2021-12-28 (×10): qty 1

## 2021-12-28 MED ORDER — LEVOFLOXACIN IN D5W 750 MG/150ML IV SOLN
750.0000 mg | INTRAVENOUS | Status: DC
  Administered 2021-12-29 – 2022-01-02 (×3): 750 mg via INTRAVENOUS
  Filled 2021-12-28 (×3): qty 150

## 2021-12-28 MED ORDER — FAMOTIDINE 20 MG PO TABS: 20.0000 mg | ORAL_TABLET | Freq: Two times a day (BID) | ORAL | 0 refills | Status: DC

## 2021-12-28 MED ORDER — SODIUM ZIRCONIUM CYCLOSILICATE 10 G PO PACK
10.0000 g | PACK | Freq: Once | ORAL | Status: AC
  Administered 2021-12-28: 10 g
  Filled 2021-12-28: qty 1

## 2021-12-28 MED ORDER — MIDAZOLAM-SODIUM CHLORIDE 100-0.9 MG/100ML-% IV SOLN: 0.5000 mg/h | INTRAVENOUS | 0 refills | Status: DC

## 2021-12-28 MED ORDER — SODIUM CHLORIDE 0.9 % IV SOLN: 2.0000 g | Freq: Two times a day (BID) | INTRAVENOUS | 0 refills | Status: DC

## 2021-12-28 MED ORDER — SODIUM CHLORIDE 0.9% FLUSH: 3.0000 mL | Freq: Two times a day (BID) | INTRAVENOUS | 1 refills | Status: DC

## 2021-12-28 MED ORDER — CHLORHEXIDINE GLUCONATE CLOTH 2 % EX PADS: 6.0000 | MEDICATED_PAD | Freq: Every day | CUTANEOUS | 0 refills | Status: DC

## 2021-12-28 MED ORDER — INSULIN ASPART 100 UNIT/ML IJ SOLN
0.0000 [IU] | INTRAMUSCULAR | Status: DC
  Administered 2021-12-28 (×2): 4 [IU] via SUBCUTANEOUS
  Filled 2021-12-28 (×2): qty 1

## 2021-12-28 MED ORDER — ORAL CARE MOUTH RINSE
15.0000 mL | OROMUCOSAL | Status: DC
  Administered 2021-12-28 – 2022-01-08 (×124): 15 mL via OROMUCOSAL

## 2021-12-28 MED ORDER — SODIUM CHLORIDE 0.9 % IV SOLN
2.0000 g | Freq: Two times a day (BID) | INTRAVENOUS | Status: DC
  Administered 2021-12-28: 2 g via INTRAVENOUS
  Filled 2021-12-28: qty 2

## 2021-12-28 MED ORDER — ARTIFICIAL TEARS OPHTHALMIC OINT
1.0000 | TOPICAL_OINTMENT | Freq: Three times a day (TID) | OPHTHALMIC | Status: DC
  Administered 2021-12-29 – 2022-01-03 (×15): 1 via OPHTHALMIC
  Filled 2021-12-28: qty 3.5

## 2021-12-28 MED ORDER — VALACYCLOVIR HCL 500 MG PO TABS
1000.0000 mg | ORAL_TABLET | Freq: Two times a day (BID) | ORAL | Status: DC
  Administered 2021-12-28 – 2022-01-06 (×17): 1000 mg
  Filled 2021-12-28 (×20): qty 2

## 2021-12-28 MED ORDER — TOBRAMYCIN 300 MG/5ML IN NEBU
300.0000 mg | INHALATION_SOLUTION | Freq: Two times a day (BID) | RESPIRATORY_TRACT | Status: DC
  Administered 2021-12-29 – 2022-01-01 (×6): 300 mg via RESPIRATORY_TRACT
  Filled 2021-12-28 (×9): qty 5

## 2021-12-28 MED ORDER — MIDAZOLAM-SODIUM CHLORIDE 100-0.9 MG/100ML-% IV SOLN
0.5000 mg/h | INTRAVENOUS | Status: DC
  Administered 2021-12-28: 3 mg/h via INTRAVENOUS
  Filled 2021-12-28: qty 100

## 2021-12-28 MED ORDER — POLYETHYLENE GLYCOL 3350 17 G PO PACK
17.0000 g | PACK | Freq: Every day | ORAL | Status: DC
  Administered 2021-12-28 – 2022-01-02 (×5): 17 g
  Filled 2021-12-28 (×4): qty 1

## 2021-12-28 MED ORDER — FAMOTIDINE 20 MG PO TABS
20.0000 mg | ORAL_TABLET | Freq: Every day | ORAL | Status: DC
  Administered 2021-12-28 – 2022-01-07 (×10): 20 mg
  Filled 2021-12-28 (×10): qty 1

## 2021-12-28 MED ORDER — CISATRACURIUM BOLUS VIA INFUSION
0.1000 mg/kg | Freq: Once | INTRAVENOUS | Status: AC
  Administered 2021-12-28: 8.8 mg via INTRAVENOUS
  Filled 2021-12-28: qty 9

## 2021-12-28 MED ORDER — ARTIFICIAL TEARS OPHTHALMIC OINT
1.0000 | TOPICAL_OINTMENT | Freq: Three times a day (TID) | OPHTHALMIC | Status: DC
  Administered 2021-12-28 – 2021-12-29 (×2): 1 via OPHTHALMIC
  Filled 2021-12-28: qty 3.5

## 2021-12-28 MED ORDER — PANTOPRAZOLE SODIUM 40 MG PO TBEC: 40.0000 mg | DELAYED_RELEASE_TABLET | Freq: Every day | ORAL | Status: DC

## 2021-12-28 MED ORDER — PANTOPRAZOLE SODIUM 40 MG IV SOLR
40.0000 mg | Freq: Every day | INTRAVENOUS | Status: DC
  Administered 2021-12-28 – 2022-01-07 (×11): 40 mg via INTRAVENOUS
  Filled 2021-12-28 (×12): qty 10

## 2021-12-28 MED ORDER — SODIUM CHLORIDE 3 % IN NEBU
4.0000 mL | INHALATION_SOLUTION | Freq: Two times a day (BID) | RESPIRATORY_TRACT | Status: AC
  Administered 2021-12-28 – 2021-12-31 (×6): 4 mL via RESPIRATORY_TRACT
  Filled 2021-12-28 (×6): qty 4

## 2021-12-28 MED ORDER — NOREPINEPHRINE 4 MG/250ML-% IV SOLN: 0.0000 ug/min | INTRAVENOUS | 0 refills | Status: DC | PRN

## 2021-12-28 MED ORDER — NOREPINEPHRINE 4 MG/250ML-% IV SOLN
INTRAVENOUS | Status: AC
  Filled 2021-12-28: qty 250

## 2021-12-28 MED ORDER — MIDAZOLAM BOLUS VIA INFUSION
0.0000 mg | INTRAVENOUS | Status: DC | PRN
  Administered 2021-12-31 (×2): 2 mg via INTRAVENOUS
  Administered 2022-01-01: 1 mg via INTRAVENOUS
  Administered 2022-01-01: 2 mg via INTRAVENOUS
  Administered 2022-01-02: 1 mg via INTRAVENOUS

## 2021-12-28 MED ORDER — VITAL AF 1.2 CAL PO LIQD: 1000.0000 mL | ORAL | 0 refills | Status: DC

## 2021-12-28 NOTE — Progress Notes (Signed)
[facility] has no ICU beds

## 2021-12-28 NOTE — Discharge Summary (Signed)
Physician Discharge Summary  Patient ID: Janet Hensley MRN: [id number] DOB/AGE: February 21, 1970 51 y.o.  Admit date: 12/22/2021 Discharge date: 12/28/2021   Brief Pt Description / Synopsis:    Discharge Diagnoses:   Acute hypoxic and acute hypercapnic respiratory failure Severe community acquired pneumonia, urine tested positive for Legionella.  Coloscopy aspirate positive for Pseudomonas and stenotrophomonas. Whiteout left lung secondary to consolidation. Severe protein calorie malnutrition COPD exacerbation Active smoker Severe sepsis Acute renal failure Bacteremia ROTHIA MUCILAGINOSA  Thrombocytopenia secondary to sepsis. Hyperglycemia secondary to steroid use and dextrose infusion Anasarca                                                            Discharge Summary:   The patient is a 51 year old lady with history of cigarette smoking.  She was brought to the ER on November 26 with increasing cough shortness of breath nausea vomiting.   EMS found patient to have O2 saturation of 89% on room air with increased work of breathing and wheezing.  EMS provided IV steroids IV magnesium and DuoNebs. Patient had a blood pressure of 90 systolic at the time of presentation to the ER and received a 600 cc normal saline bolus. In the recent past prior to presenting to the ER she was tested negative for COVID and RSV.  The patient was initially evaluated by the hospitalist service.  The patient was initially started on BiPAP however required intubationFor worsening respiratory failure.  She did have bronchoscopy done which showed significant purulent secretions in bilateral tracheobronchial tree particularly the left side.  The patient has hypoxic and hypercapnic respiratory failure.  She also has developed acute renal failure.  Patient's husband indicated that the patient will crush oxycodone and snot through the nose.  Also significant use of hot tub.  History of recurrent sinusitis and used  multiple nose spray OTC. Patient also has history of smoking 1 pack of cigarettes per day and drinks hard lemonade daily. I have discussed with the husband regarding transfer to tertiary care center for possible need of ECMO he is agreeable. Patient remains tachycardic and has persistent respiratory acidosis despite being on ventilator and metabolic acidosis.  Discharge Plan    1.Case was discussed with [place] [facility] for possible need for ECMO.  Patient has been accepted will be transferred when bed is available.   2.The patient's husband advised me to [facility] [place] [place] [facility]  3.Infectious disease consultation was made patient is on broad-spectrum antibiotics to cover Pseudomonas Legionella and stenotrophomonas. 4.Patient is transition from propofol to midazolam in the setting of triglyceridemia 5.Sodium bicarbonate infusion 6. Nebulized tobramycin 7. Discontinue dextrose 8.  Serial blood gas analysis every 6 hours patient has arterial line in the groin. 9.  Dual Pseudomonas and Legionella coverage. 10.  Follow results of immunoglobulin levels ?CVD     Significant Diagnostic Studies:   Narrative & Impression  CLINICAL DATA:  Labored breathing; PE suspected   EXAM: CT ANGIOGRAPHY CHEST WITH CONTRAST   TECHNIQUE: Multidetector CT imaging of the chest was performed using the standard protocol during bolus administration of intravenous contrast. Multiplanar CT image reconstructions and MIPs were obtained to evaluate the vascular anatomy.   RADIATION DOSE REDUCTION: This exam was performed according to the departmental dose-optimization  program which includes automated exposure control, adjustment of the mA and/or kV according to patient size and/or use of iterative reconstruction technique.   CONTRAST:  75mL OMNIPAQUE IOHEXOL 350 MG/ML SOLN   COMPARISON:  Radiographs earlier today   FINDINGS: Cardiovascular: Satisfactory opacification  of the central pulmonary arteries. Respiratory motion decreases sensitivity for evaluation of pulmonary embolism in the segmental pulmonary arteries. No evidence of pulmonary embolism. Normal heart size. No pericardial effusion.   Mediastinum/Nodes: Numerous enlarged mediastinal and right hilar nodes. For example right paratracheal node measuring 1.4 cm and right hilar node measuring 1.3 cm. The left hilum is silhouetted by adjacent atelectasis/consolidation of the left lung. Unremarkable esophagus. Normal thyroid.   Lungs/Pleura: Near-complete opacification of the left lung with low-attenuation fluid/infiltrate. There are a few areas of peribronchovascular crowding suggesting atelectasis however overall lung volume is largely preserved. Segmental bronchial narrowing/occlusion in the right upper lobe, lingula, and right lower lobe. Trace left pleural effusion. No pneumothorax. Paraseptal emphysema in the apices.   Innumerable small pulmonary nodules in the right lower lobe and right middle lobe in a peribronchovascular distribution. More confluence nodules with subtle adjacent ground-glass opacity in the right upper lobe measuring up to 18.5 mm.   Upper Abdomen: Hepatic steatosis. Round hypoattenuating lesion in the liver is not significantly changed from 2015 and should be benign cysts. No follow-up recommended. Cyst in the right kidney. No follow-up recommended.   Musculoskeletal: No chest wall abnormality. No acute osseous findings.   Review of the MIP images confirms the above findings.   IMPRESSION: 1. Near-complete opacification of the left lung with low-attenuation fluid/infiltrate. This is likely in part due to pneumonia possibly postobstructive in nature given mucus and secretions in multiple segmental bronchi throughout the left lung. However given the mediastinal adenopathy and nodularity in the right lung underlying malignancy is difficult to exclude. Short  interval follow-up after treatment is recommended. 2. Multiple indeterminate pulmonary nodules in the right lung may be infectious/inflammatory however metastases are not excluded. 3. Mediastinal and right hilar adenopathy.     Electronically Signed   By: Minerva Fester M.D.   On: 12/22/2021 20:35                Micro Data:   BRONCHIAL ALVEOLAR LAVAGE Performed at [facility], [address], [place], [place] [address]   Special Requests Normal Performed at [facility], [address] [place], [place] [address]  Gram Stain NO WBC SEEN MODERATE GRAM NEGATIVE RODS Performed at [facility], [address] [place], [place] [address]  Culture  Abnormal  >=100,000 COLONIES/mL PSEUDOMONAS AERUGINOSA 80,000 COLONIES/mL STENOTROPHOMONAS MALTOPHILIA   Report Status PENDING  Organism ID, Bacteria PSEUDOMONAS AERUGINOSA Abnormal   Organism ID, Bacteria STENOTROPHOMONAS MALTOPHILIA Abnormal   Resulting Agency [facility]     Susceptibility   Pseudomonas aeruginosa Stenotrophomonas maltophilia    MIC MIC    CEFEPIME 2 SENSITIVE Sensitive      CEFTAZIDIME 4 SENSITIVE Sensitive      CIPROFLOXACIN <=0.25 SENS... Sensitive      GENTAMICIN 2 SENSITIVE Sensitive      IMIPENEM 1 SENSITIVE Sensitive      LEVOFLOXACIN   0.25 SENSIT... Sensitive    PIP/TAZO 8 SENSITIVE Sensitive      TRIMETH/SULFA   <=20 SENSIT... Sensitive             Susceptibility Comments  Pseudomonas aeruginosa  >=100,000 COLONIES/mL PSEUDOMONAS AERUGINOSA  Stenotrophomonas maltophilia  80,000 COLONIES/mL STENOTROPHOMONAS MALTOPHILIA  Specimen Collected: 12/25/21 11:46 Last Resulted: 12/27/21 15:47       Collected: 12/23/21 0411  Result status: Final  Resulting lab: [facility]  Reference range: Negative  Value: Positive Abnormal   Comment: (NOTE) Presumptive positive for the presence of L. pneumophila serogroup 1 antigen in urine, suggesting  current or past infection. Performed At: [facility] [address] [place], [place] [id number] Jolene Schimke MD LK:[id number]  *Additional information available - comment    Contains abnormal data Legionella Pneumophila Serogp 1 Ur Ag Order: [id number] Status: Final result     Visible to patient: No (inaccessible in MyChart)     Next appt: None   0 Result Notes     Component Ref Range & Units 5 d ago  L. pneumophila Serogp 1 Ur Ag Negative Positive Abnormal   Comment: (NOTE) Presumptive positive for the presence of L. pneumophila serogroup 1 antigen in urine, suggesting current or past infection. Performed At: [facility] [address] [place], [place] [id number] Jolene Schimke MD HR:[id number]  Source of Sample  URINE, RANDOM  Comment: Performed at [facility], [address] [place], [place] [address]  Resulting Agency  [facility]                 Consults:   Axis disease  Discharge Exam:  General: Tachypneic and tachycardic on the ventilator. HENT: Mild pallor present. Lungs: Air entry very diminished on the left side with bronchial breath sounds.  Dull to percussion on the left side air entry heard in the right lung with rhonchi and axillary crackles. Cardiovascular: S1-S2 tachycardia limits examination Abdomen: Soft nontender bowel sounds heard. Extremities: Anasarca Neuro: Sedated on the ventilator pupils reactive to light   Vitals:   12/28/21 0900 12/28/21 1000 12/28/21 1100 12/28/21 1200  BP:      Pulse: (!) 140 (!) 143 (!) 144 (!) 146  Resp: (!) 33 (!) 33 (!) 33 (!) 34  Temp:    98.3 F (36.8 C)  TempSrc:    Oral  SpO2: 92% 92% 91% 91%  Weight:      Height:         Discharge Labs:   BMET Recent Labs  Lab 12/22/21 2019 12/23/21 0411 12/24/21 0503 12/25/21 0349 12/26/21 0307 12/27/21 0433 12/27/21 1601 12/28/21 0412  NA  --    < > 135 137 145 148* 144 143  K  --    < > 3.5 3.8 3.9 4.8 5.1 4.9  CL   --    < > 107 111 118* 119* 110 108  CO2  --    < > 20* 19* 24 23 27 28   GLUCOSE  --    < > 163* 137* 131* 173* 346* 428*  BUN  --    < > 37* 47* 53* 80* 86* 88*  CREATININE  --    < > 0.66 0.79 0.71 1.15* 1.43* 1.70*  CALCIUM  --    < > 8.5* 9.2 8.8* 9.0 8.0* 7.3*  MG 2.4  --  2.6* 2.8* 2.6* 2.8*  --   --   PHOS  --   --  3.2 3.8 4.0 5.6*  --   --    < > = values in this interval not displayed.    CBC Recent Labs  Lab 12/26/21 0307 12/27/21 0433 12/28/21 0412  HGB 9.5* 9.6* 8.8*  HCT 28.8* 29.7* 27.8*  WBC 13.5* 16.9* 23.4*  PLT 146* 106* 90*  Anti-Coagulation No results for input(s): "INR" in the last 168 hours.              Disposition: Transfer to tertiary care center Discharged Condition: Guarded      Signed: Tyus Kallam MD [facility]

## 2021-12-28 NOTE — Progress Notes (Signed)
Report given to Janet Dike RN at [facility]. All questions answered. Care Link Team provided with all patient information and questions answered. Patient to be transported on continuous gtts of Fentanyl and Versed, Sodium Bicarb in NS. Foley and OG at documented placement at time of transport. Family at bedside aware of patient being transported and packed all patient belongings in room. Patient took all belongings with them at time of departure. Family expressed appreciation for the care the patient has received here at [facility] and [facility].

## 2021-12-28 NOTE — H&P (Signed)
NAME:  Janet Hensley, MRN:  [id number], DOB:  19-Jun-1970, LOS: 0 ADMISSION DATE:  12/28/2021, CONSULTATION DATE:  12/28/2021  REFERRING MD:  Willette Cluster, CHIEF COMPLAINT: Severe pneumonia  History of Present Illness:  51 year old smoker presented to [facility] 11/26 with cough, shortness of breath nausea and vomiting, hypoxic to 89%, required intubation for mechanical ventilation. Chest x-ray showed complete whiteout of left lung.  Bronchoscopy showed purulent secretions on the left which were suctioned out. History of snorting crushed oxycodone, use of hot tub Blood cultures showed Rothia Mucilaginosa, urine tested positive for Legionella, bronchoscopy was positive for Pseudomonas and stenotrophomonas .  ID consult was obtained she was treated with cefepime and levofloxacin and nebulized tobramycin .  Course was complicated by AKI and bicarbonate drip was started She was transferred to [facility] for ARDS management and possible need for ECMO   Pertinent  Medical History    Significant Hospital Events: Including procedures, antibiotic start and stop dates in addition to other pertinent events   CTA chest -complete opacification of left lung, multiple pulmonary nodules, mediastinal and right hilar lymphadenopathy  Interim History / Subjective:  On arrival saturation 91% on 80%/PEEP of 10 , tachypneic to 30s  Objective   Last menstrual period 01/16/2016.    Vent Mode: PRVC FiO2 (%):  [80 %] 80 % Set Rate:  [26 bmp] 26 bmp Vt Set:  [380 mL] 380 mL PEEP:  [10 cmH20] 10 cmH20 Plateau Pressure:  [27 cmH20] 27 cmH20  No intake or output data in the 24 hours ending 12/28/21 1755 There were no vitals filed for this visit.  Examination: General: Acutely ill-appearing, sedated on Versed and fentanyl drips HENT: Mild pallor, no icterus, no JVD, oral ETT, bloody crusted lesions over upper lip Lungs: Decreased breath sounds on left, good air entry on right, mild accessory muscle  use Cardiovascular: S1-S2 tacky 140s Abdomen: Soft, distended, nontender Extremities: 2+ edema, no deformity Neuro: Sedate, RASS -3 to -4 GU: Clear urine  Chest x-ray dependently reviewed shows complete whiteout of left lung and new right lower lobe infiltrate ABG shows acute respiratory acidosis and pO2 58 on 80%/PEEP of 10 BUN/creatinine 88/1.7 Leukocytosis, thrombocytopenia  Resolved Hospital Problem list     Assessment & Plan:  ARDS/gram-negative pneumonia with complete whiteout of left lung -Pseudomonas/stenotrophomonas -Legionella pneumonia -?  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, 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, encapsulated, non-spore-forming and non-motile coccus, present in clusters, tetrads or pairs that is a part of the normal oropharyngeal flora -Likely related to her history of snorting oxycodone  -Appears sensitive to penicillins,  AKI -avoid nephrotoxins -DC bicarbonate drip -Serial BMET  Hypotension -related to sedating meds Use Neo-Synephrine given severe tachycardia  Valacyclovir for perioral herpes   Best Practice (right click and "Reselect all SmartList Selections" daily)   Diet/type: NPO DVT prophylaxis: prophylactic heparin  GI prophylaxis: PPI Lines: Central line Foley:  Yes, and it is still needed Code Status:  full code Last date of multidisciplinary goals of  care  discussion [NA]  Labs   CBC: Recent Labs  Lab 12/22/21 1836 12/23/21 0411 12/24/21 0503 12/25/21 0349 12/26/21 0307 12/27/21 0433 12/28/21 0412  WBC 6.0   < > 8.2 12.5* 13.5* 16.9* 23.4*  NEUTROABS 5.2  --   --   --   --   --   --   HGB 11.3*   < > 9.4* 9.0* 9.5* 9.6* 8.8*  HCT 32.7*   < > 27.5* 27.2* 28.8* 29.7* 27.8*  MCV 91.6   < > 93.9 93.2 93.8 96.4 101.1*  PLT 217   < > 181 165 146* 106* 90*   < > = values in this interval not displayed.    Basic Metabolic Panel: Recent Labs  Lab 12/22/21 2019 12/23/21 0411 12/24/21 0503 12/25/21 0349 12/26/21 0307 12/27/21 0433 12/27/21 1601 12/28/21 0412  NA  --    < > 135 137 145 148* 144 143  K  --    < > 3.5 3.8 3.9 4.8 5.1 4.9  CL  --    < > 107 111 118* 119* 110 108  CO2  --    < > 20* 19* 24 23 27 28   GLUCOSE  --    < > 163* 137* 131* 173* 346* 428*  BUN  --    < > 37* 47* 53* 80* 86* 88*  CREATININE  --    < > 0.66 0.79 0.71 1.15* 1.43* 1.70*  CALCIUM  --    < > 8.5* 9.2 8.8* 9.0 8.0* 7.3*  MG 2.4  --  2.6* 2.8* 2.6* 2.8*  --   --   PHOS  --   --  3.2 3.8 4.0 5.6*  --   --    < > = values in this interval not displayed.   GFR: Estimated Creatinine Clearance: 41.2 mL/min (A) (by C-G formula based on SCr of 1.7 mg/dL (H)). Recent Labs  Lab 12/22/21 2205 12/23/21 0411 12/24/21 0503 12/25/21 0349 12/26/21 0307 12/27/21 0433 12/27/21 1647 12/27/21 1930 12/28/21 0412  PROCALCITON  --   --  19.32 13.40  --  5.19  --   --  3.67  WBC  --  5.5 8.2 12.5* 13.5* 16.9*  --   --  23.4*  LATICACIDVEN 5.6* 2.8*  --   --   --   --  1.1 1.5  --     Liver Function Tests: Recent Labs  Lab 12/22/21 1836 12/23/21 0411 12/28/21 0412  AST 168* 128* 62*  ALT 40 36 39  ALKPHOS 56 46 108  BILITOT 1.3* 3.4* 1.0  PROT 5.3* 4.9* 5.1*  ALBUMIN <1.5* <1.5* <1.5*   No results for input(s): "LIPASE", "AMYLASE" in the last 168 hours. No results for input(s): "AMMONIA" in the last 168 hours.  ABG    Component Value Date/Time    PHART 7.29 (L) 12/28/2021 1317   PCO2ART 53 (H) 12/28/2021 1317   PO2ART 71 (L) 12/28/2021 1317   HCO3 25.5 12/28/2021 1317   ACIDBASEDEF 1.9 12/28/2021 1317   O2SAT 94.5 12/28/2021 1317     Coagulation Profile: No results for input(s): "INR", "PROTIME" in the last 168 hours.  Cardiac Enzymes: Recent Labs  Lab 12/22/21 1836 12/27/21 0433  CKTOTAL 26* 31*    HbA1C: Hgb A1c MFr Bld  Date/Time Value Ref Range Status  12/25/2021 03:49 AM 5.8 (H) 4.8 - 5.6 % Final    Comment:    (NOTE)  Prediabetes: 5.7 - 6.4         Diabetes: >6.4         Glycemic control for adults with diabetes: <7.0     CBG: Recent Labs  Lab 12/28/21 0329 12/28/21 0727 12/28/21 1158 12/28/21 1600 12/28/21 1727  GLUCAP 218* 182* 170* 209* 138*    Review of Systems:   Unable to obtain  Past Medical History:  She,  has a past medical history of Asthma (01/15/2012).   Surgical History:   Past Surgical History:  Procedure Laterality Date   LEFT HEART CATH AND CORONARY ANGIOGRAPHY N/A 12/24/2021   Procedure: LEFT HEART CATH AND CORONARY ANGIOGRAPHY;  Surgeon: Nelva Bush, MD;  Location: [facility];  Service: Cardiovascular;  Laterality: N/A;     Social History:      Family History:  Her family history is not on file.   Allergies No Known Allergies   Home Medications  Prior to Admission medications   Medication Sig Start Date End Date Taking? Authorizing Provider  acetaminophen (TYLENOL) 325 MG tablet Take 2 tablets (650 mg total) by mouth every 6 (six) hours as needed for mild pain, moderate pain, fever or headache (or Fever >/= 101). 12/28/21   Jeani Hawking, MD  acetaminophen (TYLENOL) 650 MG suppository Place 1 suppository (650 mg total) rectally every 6 (six) hours as needed for mild pain, moderate pain or fever (or Fever >/= 101). 12/28/21   Chawla, Harsh, MD  budesonide (PULMICORT) 0.25 MG/2ML nebulizer solution Take 2 mLs (0.25 mg total) by nebulization 2  (two) times daily. 12/28/21   Chawla, Harsh, MD  ceFEPIme 2 g in sodium chloride 0.9 % 100 mL Inject 2 g into the vein every 12 (twelve) hours. 12/29/21   Jeani Hawking, MD  Chlorhexidine Gluconate Cloth 2 % PADS Apply 6 each topically daily. 12/29/21   Jeani Hawking, MD  docusate (COLACE) 50 MG/5ML liquid Place 10 mLs (100 mg total) into feeding tube 2 (two) times daily. 12/28/21   Jeani Hawking, MD  famotidine (PEPCID) 20 MG tablet Place 1 tablet (20 mg total) into feeding tube 2 (two) times daily. 12/28/21   Jeani Hawking, MD  fentaNYL (SUBLIMAZE) SOLN Inject 50-100 mcg into the vein every 15 (fifteen) minutes as needed (to maintain RASS & CPOT goal.). 12/28/21   Chawla, Harsh, MD  fentaNYL 10 mcg/ml SOLN infusion Inject 0-200 mcg/hr into the vein continuous. 12/28/21   Chawla, Harsh, MD  insulin aspart (NOVOLOG) 100 UNIT/ML injection Inject 0-20 Units into the skin every 4 (four) hours. 12/28/21   Chawla, Harsh, MD  ipratropium-albuterol (DUONEB) 0.5-2.5 (3) MG/3ML SOLN Take 3 mLs by nebulization every 5 (five) minutes x 3 doses as needed. 12/28/21   Chawla, Harsh, MD  ipratropium-albuterol (DUONEB) 0.5-2.5 (3) MG/3ML SOLN Take 3 mLs by nebulization every 4 (four) hours. 12/28/21   Jeani Hawking, MD  levofloxacin (LEVAQUIN) 750 MG/150ML SOLN Inject 150 mLs (750 mg total) into the vein every other day. 12/29/21   Jeani Hawking, MD  methylPREDNISolone sodium succinate (SOLU-MEDROL) 40 mg/mL injection Inject 0.5 mLs (20 mg total) into the vein 2 (two) times daily. 12/28/21   Jeani Hawking, MD  midazolam (VERSED) 2 MG/2ML SOLN injection Inject 2-4 mLs (2-4 mg total) into the vein every hour as needed for agitation or sedation. 12/28/21   Jeani Hawking, MD  midazolam-sodium chloride 100-0.9 MG/100ML-% SOLN Inject 0.5-10 mg/hr into the vein continuous. 12/28/21   Jeani Hawking, MD  minocycline (MINOCIN) 100 MG capsule 2 capsules (200  mg total) by Per NG tube route 2 (two) times daily. 12/28/21   Jeani Hawking, MD   norepinephrine (LEVOPHED) 4-5 MG/250ML-% SOLN Inject 0-40 mcg/min into the vein every hour as needed. 12/28/21   Jeani Hawking, MD  Nutritional Supplements (FEEDING SUPPLEMENT, VITAL AF 1.2 CAL,) LIQD Place 1,000 mLs into feeding tube continuous. 12/28/21   Chawla, Harsh, MD  polyethylene glycol (MIRALAX / GLYCOLAX) 17 g packet Place 17 g into feeding tube daily. 12/29/21   Jeani Hawking, MD  Protein (FEEDING SUPPLEMENT, PROSOURCE TF20,) liquid Place 60 mLs into feeding tube daily. 12/29/21   Chawla, Harsh, MD  sodium bicarbonate 75 mEq in sodium chloride 0.45 % 1,000 mL Inject 1 mL into the vein continuous. 12/28/21   Chawla, Harsh, MD  sodium chloride 0.9 % infusion Inject 250 mLs into the vein continuous. 12/28/21   Chawla, Harsh, MD  sodium chloride 0.9 % infusion Inject 250 mLs into the vein as needed (for IV line care  (Saline / Heparin Lock)). 12/28/21   Chawla, Harsh, MD  sodium chloride flush (NS) 0.9 % SOLN Inject 3 mLs into the vein every 12 (twelve) hours. 12/28/21   Chawla, Harsh, MD  tobramycin, PF, (TOBI) 300 MG/5ML nebulizer solution Take 5 mLs (300 mg total) by nebulization 2 (two) times daily. 12/28/21   Jeani Hawking, MD  valACYclovir (VALTREX) 1000 MG tablet Place 1 tablet (1,000 mg total) into feeding tube 2 (two) times daily. 12/28/21   Jeani Hawking, MD     Critical care time: [place] MD. Shade Flood. [facility] Pager : [phone]  If no response to pager , please call [phone] until 7 pm After 7:00 pm call Elink  [phone]   12/28/2021

## 2021-12-28 NOTE — Progress Notes (Signed)
PHARMACY NOTE:  ANTIMICROBIAL RENAL DOSAGE ADJUSTMENT  Current antimicrobial regimen includes a mismatch between antimicrobial dosage and estimated renal function.  As per policy approved by the Pharmacy & Therapeutics and Medical Executive Committees, the antimicrobial dosage will be adjusted accordingly.  Current antimicrobial dosage:   Cefepime 2 gm IV q8h and Levofloxacin 750 mg IV q24h  Indication: Pneumonia  Renal Function:  Estimated Creatinine Clearance: 41.2 mL/min (A) (by C-G formula based on SCr of 1.7 mg/dL (H)). Scr 1.15>>1.43>>1.70    Antimicrobial dosage has been changed to:   Cefepime 2 gm IV q12h Levaquin 750 mg IV q48h  Additional comments:   Thank you for allowing pharmacy to be a part of this patient's care.  Ashten Prats A, [facility] 12/28/2021 8:09 AM

## 2021-12-28 NOTE — Progress Notes (Signed)
Pulmonary and Critical care    NAME:  Janet Hensley, MRN:  [id number], DOB:  1970/08/23, LOS: 6 ADMISSION DATE:  12/22/2021,     History of Present Illness:  The patient is a 51 year old lady with history of cigarette smoking.  She was brought to the ER on November 26 with increasing cough shortness of breath nausea vomiting.  The patient was initially evaluated by the hospitalist service.  The patient was initially on BiPAP however required intubationFor worsening respiratory failure.  She did have bronchoscopy done which showed significant purulent secretions in bilateral tracheobronchial tree particularly the left side.  The patient has hypoxic and hypercapnic respiratory failure.  She also has developed acute renal failure.             12/28/2021   10:00 AM 12/28/2021    9:00 AM 12/28/2021    8:00 AM  Vitals with BMI  Pulse 143 140 139    Vent Mode: PRVC FiO2 (%):  [80 %] 80 % Set Rate:  [16 bmp-26 bmp] 26 bmp Vt Set:  [380 mL-400 mL] 380 mL PEEP:  [10 cmH20] 10 cmH20 Plateau Pressure:  [27 cmH20] 27 cmH20   Examination: General: Tachypneic and tachycardic on the ventilator. HENT: Mild pallor present. Lungs: Air entry very diminished on the left side with bronchial breath sounds.  Dull to percussion on the left side air entry heard in the right lung with rhonchi and axillary crackles. Cardiovascular: S1-S2 tachycardia limits examination Abdomen: Soft nontender bowel sounds heard. Extremities: Anasarca Neuro: Sedated on the ventilator pupils reactive to light    Assessment & Plan:  Acute hypoxic and acute hypercapnic respiratory failure Severe community acquired pneumonia Whiteout left lung secondary to consolidation. Severe protein calorie malnutrition COPD exacerbation Active smoker Severe sepsis Acute renal failure Bacteremia Thrombocytopenia secondary to sepsis. Hyperglycemia secondary to steroid use and dextrose infusion Plan Case was discussed with [facility] for possible need for ECMO.  Patient has been accepted however currently no bed is available.  The patient's husband advised me to call [facility] however no beds available at at [facility].  [facility] was also called at patient request however, I have not received a call back from [facility] yet. Infectious disease consultation was made patient is on broad-spectrum antibiotics to cover Pseudomonas Legionella and stenotrophomonas. Patient is transition from propofol to midazolam in the setting of triglyceridemia Sodium bicarbonate infusion Nebulized tobramycin Discontinue dextrose    Best Practice (right click and "Reselect all SmartList Selections" daily)   Diet/type: tubefeeds DVT prophylaxis: LMWH GI prophylaxis: H2B Lines: Central line Foley:  Yes, and it is still needed Code Status:  full code  Labs   CBC: Recent Labs  Lab 12/22/21 1836 12/23/21 0411 12/24/21 0503 12/25/21 0349 12/26/21 0307 12/27/21 0433 12/28/21 0412  WBC 6.0   < > 8.2 12.5* 13.5* 16.9* 23.4*  NEUTROABS 5.2  --   --   --   --   --   --   HGB 11.3*   < > 9.4* 9.0* 9.5* 9.6* 8.8*  HCT 32.7*   < > 27.5* 27.2* 28.8* 29.7* 27.8*  MCV 91.6   < > 93.9 93.2 93.8 96.4 101.1*  PLT 217   < > 181 165 146* 106* 90*   < > = values in this interval not displayed.    Basic Metabolic Panel: Recent Labs  Lab 12/22/21 2019 12/23/21 0411 12/24/21 0503 12/25/21 4709 12/26/21 0307 12/27/21 0433 12/27/21 1601 12/28/21 0412  NA  --    < >  135 137 145 148* 144 143  K  --    < > 3.5 3.8 3.9 4.8 5.1 4.9  CL  --    < > 107 111 118* 119* 110 108  CO2  --    < > 20* 19* _0 GLUCOSE  --    < > 163* 137* 131* 173* 346* 428*  BUN  --    < > 37* 47* 53* 80* 86* 88*  CREATININE  --    < > 0.66 0.79 0.71 1.15* 1.43* 1.70*  CALCIUM  --    < > 8.5* 9.2 8.8* 9.0 8.0* 7.3*  MG 2.4  --  2.6* 2.8* 2.6* 2.8*  --   --   PHOS  --   --  3.2 3.8 4.0 5.6*  --   --    < > = values in this interval not displayed.    GFR: Estimated Creatinine Clearance: 41.2 mL/min (A) (by C-G formula based on SCr of 1.7 mg/dL (H)). Recent Labs  Lab 12/22/21 2205 12/23/21 0411 12/24/21 0503 12/25/21 0349 12/26/21 0307 12/27/21 0433 12/27/21 1647 12/27/21 1930 12/28/21 0412  PROCALCITON  --   --  19.32 13.40  --  5.19  --   --  3.67  WBC  --  5.5 8.2 12.5* 13.5* 16.9*  --   --  23.4*  LATICACIDVEN 5.6* 2.8*  --   --   --   --  1.1 1.5  --     Liver Function Tests: Recent Labs  Lab 12/22/21 1836 12/23/21 0411 12/28/21 0412  AST 168* 128* 62*  ALT 40 36 39  ALKPHOS 56 46 108  BILITOT 1.3* 3.4* 1.0  PROT 5.3* 4.9* 5.1*  ALBUMIN <1.5* <1.5* <1.5*   No results for input(s): "LIPASE", "AMYLASE" in the last 168 hours. No results for input(s): "AMMONIA" in the last 168 hours.  ABG    Component Value Date/Time   PHART 7.25 (L) 12/27/2021 2000   PCO2ART 56 (H) 12/27/2021 2000   PO2ART 75 (L) 12/27/2021 2000   HCO3 24.6 12/27/2021 2000   ACIDBASEDEF 3.5 (H) 12/27/2021 2000   O2SAT 96.9 12/27/2021 2000     Coagulation Profile: No results for input(s): "INR", "PROTIME" in the last 168 hours.  Cardiac Enzymes: Recent Labs  Lab 12/22/21 1836 12/27/21 0433  CKTOTAL 26* 31*    HbA1C: Hgb A1c MFr Bld  Date/Time Value Ref Range Status  12/25/2021 03:49 AM 5.8 (H) 4.8 - 5.6 % Final    Comment:    (NOTE)         Prediabetes: 5.7 - 6.4         Diabetes: >6.4         Glycemic control for adults with diabetes: <7.0     CBG: Recent Labs  Lab 12/27/21 1616 12/27/21 1928 12/27/21 2336 12/28/21 0329 12/28/21 0727  GLUCAP 359* 160* 209* 218* 182*    RADIOLOGY Persistent whiteout left lung, worsening right basilar infiltrate.  Review of Systems:   Patient is sedated unable to give meaningful history  Past Medical History:  She,  has a past medical history of Asthma (01/15/2012).   Surgical History:   Past Surgical History:  Procedure Laterality Date   LEFT HEART CATH AND CORONARY  ANGIOGRAPHY N/A 12/24/2021   Procedure: LEFT HEART CATH AND CORONARY ANGIOGRAPHY;  Surgeon: Nelva Bush, MD;  Location: [facility];  Service: Cardiovascular;  Laterality: N/A;     Social History:  Family History:  Her family history is not on file.   Allergies No Known Allergies   Home Medications  Prior to Admission medications   Medication Sig Start Date End Date Taking? Authorizing Provider  cyclobenzaprine (FLEXERIL) 10 MG tablet Take 10 mg by mouth as needed (for neck pain). 11/23/17  Yes [provider]  LORazepam (ATIVAN) 0.5 MG tablet Take 0.5-1 mg by mouth at bedtime as needed for sleep. 12/02/21  Yes [provider]  NARCAN 4 MG/0.1ML LIQD nasal spray kit Place 1 spray into the nose once. 11/14/21  Yes [provider]  oxyCODONE-acetaminophen (PERCOCET) 10-325 MG tablet Take 1 tablet by mouth 4 (four) times daily as needed for pain.   Yes [provider]     Critical care time:  I have personally spent _______70___ minutes of critical care time, exclusive of time spent on any  procedures, in evaluation and management of this critically ill patient's condition.    Rayan Ines MD ([facility]) FOR [facility]

## 2021-12-29 ENCOUNTER — Inpatient Hospital Stay (HOSPITAL_COMMUNITY): Payer: 59

## 2021-12-29 DIAGNOSIS — E875 Hyperkalemia: Secondary | ICD-10-CM

## 2021-12-29 DIAGNOSIS — J80 Acute respiratory distress syndrome: Secondary | ICD-10-CM | POA: Diagnosis not present

## 2021-12-29 DIAGNOSIS — J9601 Acute respiratory failure with hypoxia: Secondary | ICD-10-CM

## 2021-12-29 DIAGNOSIS — Z9911 Dependence on respirator [ventilator] status: Secondary | ICD-10-CM | POA: Diagnosis not present

## 2021-12-29 DIAGNOSIS — N179 Acute kidney failure, unspecified: Secondary | ICD-10-CM | POA: Diagnosis not present

## 2021-12-29 LAB — BASIC METABOLIC PANEL
Anion gap: 10 (ref 5–15)
Anion gap: 11 (ref 5–15)
BUN: 122 mg/dL — ABNORMAL HIGH (ref 6–20)
BUN: 140 mg/dL — ABNORMAL HIGH (ref 6–20)
CO2: 24 mmol/L (ref 22–32)
CO2: 22 mmol/L (ref 22–32)
Calcium: 8.2 mg/dL — ABNORMAL LOW (ref 8.9–10.3)
Calcium: 8 mg/dL — ABNORMAL LOW (ref 8.9–10.3)
Chloride: 111 mmol/L (ref 98–111)
Chloride: 111 mmol/L (ref 98–111)
Creatinine, Ser: 2.45 mg/dL — ABNORMAL HIGH (ref 0.44–1.00)
Creatinine, Ser: 2.64 mg/dL — ABNORMAL HIGH (ref 0.44–1.00)
GFR, Estimated: 23 mL/min — ABNORMAL LOW (ref >60)
GFR, Estimated: 21 mL/min — ABNORMAL LOW (ref >60)
Glucose, Bld: 126 mg/dL — ABNORMAL HIGH (ref 70–99)
Glucose, Bld: 175 mg/dL — ABNORMAL HIGH (ref 70–99)
Potassium: 6.1 mmol/L — ABNORMAL HIGH (ref 3.5–5.1)
Potassium: 5.7 mmol/L — ABNORMAL HIGH (ref 3.5–5.1)
Sodium: 145 mmol/L (ref 135–145)
Sodium: 144 mmol/L (ref 135–145)

## 2021-12-29 LAB — CULTURE, RESPIRATORY W GRAM STAIN

## 2021-12-29 LAB — CBC
HCT: 30 % — ABNORMAL LOW (ref 36.0–46.0)
Hemoglobin: 9.2 g/dL — ABNORMAL LOW (ref 12.0–15.0)
MCH: 31.4 pg (ref 26.0–34.0)
MCHC: 30.7 g/dL (ref 30.0–36.0)
MCV: 102.4 fL — ABNORMAL HIGH (ref 80.0–100.0)
Platelets: 99 10*3/uL — ABNORMAL LOW (ref 150–400)
RBC: 2.93 MIL/uL — ABNORMAL LOW (ref 3.87–5.11)
RDW: 15.9 % — ABNORMAL HIGH (ref 11.5–15.5)
WBC: 24.6 10*3/uL — ABNORMAL HIGH (ref 4.0–10.5)
nRBC: 0.2 % (ref 0.0–0.2)

## 2021-12-29 LAB — GLUCOSE, CAPILLARY
Glucose-Capillary: 114 mg/dL — ABNORMAL HIGH (ref 70–99)
Glucose-Capillary: 139 mg/dL — ABNORMAL HIGH (ref 70–99)
Glucose-Capillary: 151 mg/dL — ABNORMAL HIGH (ref 70–99)
Glucose-Capillary: 203 mg/dL — ABNORMAL HIGH (ref 70–99)
Glucose-Capillary: 209 mg/dL — ABNORMAL HIGH (ref 70–99)

## 2021-12-29 LAB — POCT I-STAT 7, (LYTES, BLD GAS, ICA,H+H)
Acid-base deficit: 1 mmol/L (ref 0.0–2.0)
Acid-base deficit: 2 mmol/L (ref 0.0–2.0)
Bicarbonate: 26.3 mmol/L (ref 20.0–28.0)
Bicarbonate: 24 mmol/L (ref 20.0–28.0)
Calcium, Ion: 1.19 mmol/L (ref 1.15–1.40)
Calcium, Ion: 1.18 mmol/L (ref 1.15–1.40)
HCT: 31 % — ABNORMAL LOW (ref 36.0–46.0)
HCT: 27 % — ABNORMAL LOW (ref 36.0–46.0)
Hemoglobin: 10.5 g/dL — ABNORMAL LOW (ref 12.0–15.0)
Hemoglobin: 9.2 g/dL — ABNORMAL LOW (ref 12.0–15.0)
O2 Saturation: 85 %
O2 Saturation: 96 %
Patient temperature: 100.6
Patient temperature: 37.9
Potassium: 5.9 mmol/L — ABNORMAL HIGH (ref 3.5–5.1)
Potassium: 5.9 mmol/L — ABNORMAL HIGH (ref 3.5–5.1)
Sodium: 144 mmol/L (ref 135–145)
Sodium: 143 mmol/L (ref 135–145)
TCO2: 28 mmol/L (ref 22–32)
TCO2: 25 mmol/L (ref 22–32)
pCO2 arterial: 59.3 mmHg — ABNORMAL HIGH (ref 32–48)
pCO2 arterial: 46.7 mmHg (ref 32–48)
pH, Arterial: 7.26 — ABNORMAL LOW (ref 7.35–7.45)
pH, Arterial: 7.323 — ABNORMAL LOW (ref 7.35–7.45)
pO2, Arterial: 62 mmHg — ABNORMAL LOW (ref 83–108)
pO2, Arterial: 93 mmHg (ref 83–108)

## 2021-12-29 LAB — PHOSPHORUS: Phosphorus: 6.5 mg/dL — ABNORMAL HIGH (ref 2.5–4.6)

## 2021-12-29 LAB — IGG, IGA, IGM
IgA: 475 mg/dL — ABNORMAL HIGH (ref 87–352)
IgG (Immunoglobin G), Serum: 1463 mg/dL (ref 586–1602)
IgM (Immunoglobulin M), Srm: 180 mg/dL (ref 26–217)

## 2021-12-29 LAB — MAGNESIUM: Magnesium: 2.6 mg/dL — ABNORMAL HIGH (ref 1.7–2.4)

## 2021-12-29 MED ORDER — FUROSEMIDE 10 MG/ML IJ SOLN
100.0000 mg | Freq: Two times a day (BID) | INTRAVENOUS | Status: DC
  Administered 2021-12-29: 100 mg via INTRAVENOUS
  Filled 2021-12-29 (×2): qty 10

## 2021-12-29 MED ORDER — VITAL HIGH PROTEIN PO LIQD: 1000.0000 mL | ORAL | Status: DC

## 2021-12-29 MED ORDER — CHLORHEXIDINE GLUCONATE CLOTH 2 % EX PADS
6.0000 | MEDICATED_PAD | Freq: Every day | CUTANEOUS | Status: DC
  Administered 2021-12-29 – 2022-01-08 (×11): 6 via TOPICAL

## 2021-12-29 MED ORDER — VITAL HIGH PROTEIN PO LIQD
1000.0000 mL | ORAL | Status: DC
  Administered 2021-12-29: 1000 mL

## 2021-12-29 MED ORDER — SENNA 8.6 MG PO TABS
1.0000 | ORAL_TABLET | Freq: Every day | ORAL | Status: DC
  Administered 2021-12-29 – 2022-01-02 (×5): 8.6 mg
  Filled 2021-12-29 (×5): qty 1

## 2021-12-29 MED ORDER — BISACODYL 10 MG RE SUPP
10.0000 mg | Freq: Once | RECTAL | Status: AC
  Administered 2021-12-29: 10 mg via RECTAL
  Filled 2021-12-29: qty 1

## 2021-12-29 MED ORDER — FUROSEMIDE 10 MG/ML IJ SOLN
120.0000 mg | Freq: Two times a day (BID) | INTRAVENOUS | Status: AC
  Administered 2021-12-29: 120 mg via INTRAVENOUS
  Filled 2021-12-29: qty 10

## 2021-12-29 MED ORDER — SODIUM ZIRCONIUM CYCLOSILICATE 10 G PO PACK
10.0000 g | PACK | Freq: Three times a day (TID) | ORAL | Status: DC
  Administered 2021-12-29 – 2022-01-01 (×9): 10 g
  Filled 2021-12-29 (×10): qty 1

## 2021-12-29 MED ORDER — INSULIN ASPART 100 UNIT/ML IJ SOLN
2.0000 [IU] | INTRAMUSCULAR | Status: DC
  Administered 2021-12-29: 6 [IU] via SUBCUTANEOUS
  Administered 2021-12-29: 2 [IU] via SUBCUTANEOUS
  Administered 2021-12-29 – 2021-12-30 (×4): 4 [IU] via SUBCUTANEOUS
  Administered 2021-12-30: 6 [IU] via SUBCUTANEOUS
  Administered 2021-12-31: 4 [IU] via SUBCUTANEOUS
  Administered 2021-12-31: 2 [IU] via SUBCUTANEOUS
  Administered 2021-12-31: 4 [IU] via SUBCUTANEOUS
  Administered 2021-12-31: 2 [IU] via SUBCUTANEOUS
  Administered 2021-12-31: 6 [IU] via SUBCUTANEOUS
  Administered 2021-12-31: 2 [IU] via SUBCUTANEOUS
  Administered 2021-12-31 – 2022-01-01 (×3): 4 [IU] via SUBCUTANEOUS
  Administered 2022-01-01 (×2): 2 [IU] via SUBCUTANEOUS
  Administered 2022-01-01 – 2022-01-02 (×2): 4 [IU] via SUBCUTANEOUS
  Administered 2022-01-02: 3 [IU] via SUBCUTANEOUS
  Administered 2022-01-02: 2 [IU] via SUBCUTANEOUS
  Administered 2022-01-02: 4 [IU] via SUBCUTANEOUS
  Administered 2022-01-02: 2 [IU] via SUBCUTANEOUS

## 2021-12-29 MED ORDER — SODIUM CHLORIDE 0.9 % IV SOLN: 2.0000 g | INTRAVENOUS | Status: DC

## 2021-12-29 MED ORDER — METOLAZONE 2.5 MG PO TABS
2.5000 mg | ORAL_TABLET | Freq: Once | ORAL | Status: AC
  Administered 2021-12-29: 2.5 mg
  Filled 2021-12-29: qty 1

## 2021-12-29 MED ORDER — SODIUM ZIRCONIUM CYCLOSILICATE 10 G PO PACK
10.0000 g | PACK | Freq: Two times a day (BID) | ORAL | Status: DC
  Administered 2021-12-29: 10 g
  Filled 2021-12-29: qty 1

## 2021-12-29 MED ORDER — PROSOURCE TF20 ENFIT COMPATIBL EN LIQD
60.0000 mL | Freq: Every day | ENTERAL | Status: DC
  Administered 2021-12-29: 60 mL
  Filled 2021-12-29: qty 60

## 2021-12-29 NOTE — Progress Notes (Signed)
Initial Nutrition Assessment  DOCUMENTATION CODES:   Obesity unspecified  INTERVENTION:  - Initiate trickle feeds of Vital HP @ 10 mL/hr.  - This will provide the pt with 240 kcals, 20 gm protein and 200 mL free water.   NUTRITION DIAGNOSIS:   Inadequate oral intake related to inability to eat as evidenced by NPO status.  GOAL:   Patient will meet greater than or equal to 90% of their needs  MONITOR:   TF tolerance  REASON FOR ASSESSMENT:   Consult Enteral/tube feeding initiation and management  ASSESSMENT:   51 y.o. female admits to [facility] related to SOB, nausea/vomiting, requiring mechanical ventilation. Pt transferred to [facility] for ARDS management and possible need for ECMO.  Meds reviewed: Colace, pepcid, sliding scale insulin, solu-medrol, miralax, senokot, lokelma. Labs reviewed: K high, BUN/Creatinine elevated.   Pt is currently NPO and on Vent with OG tube in place. MD consult to initiate trickle feeds and provide TF recommendations. RD discussed with MD, about keeping TF at a trickle due to pt's abdominal distention.  Recommend advancing TF to goal rate of 50 mL/hr when feasible. This will provide the pt with a total of 1200 kcals, 50 gm protein and 1003 mL free water.   NUTRITION - FOCUSED PHYSICAL EXAM:  Unable to complete due to remote assessment. Will attempt at f/u.   Diet Order:   Diet Order             Diet NPO time specified  Diet effective now                   EDUCATION NEEDS:   Not appropriate for education at this time  Skin:  Skin Assessment: Reviewed RN Assessment  Last BM:  PTA  Height:   Ht Readings from Last 1 Encounters:  12/23/21 5\' 3"  (1.6 m)    Weight:   Wt Readings from Last 1 Encounters:  12/28/21 88.2 kg    Ideal Body Weight:  52.3 kg  BMI:  There is no height or weight on file to calculate BMI.  Estimated Nutritional Needs:   Kcal:  1100-1235 kcals  Protein:  105-115 gm  Fluid:  >/= 1.1  L  14/02/23, RD, LDN, CNSC.

## 2021-12-29 NOTE — Progress Notes (Signed)
TOF protocol restarted d/t no twitching on 95mA.  Versed 5mg  remained infusing during initiation of TOF.  Nimbex gtts off x1 hr.    2/4 Twitches on Nimbex 3 and Versed 5mg .

## 2021-12-29 NOTE — H&P (Signed)
NAME:  Anaaya C Durley, MRN:  [id number], DOB:  09-19-1970, LOS: 1 ADMISSION DATE:  12/28/2021, CONSULTATION DATE:  12/29/2021  REFERRING MD:  Concha Se, CHIEF COMPLAINT: Severe pneumonia  History of Present Illness:  51 year old smoker presented to [facility] 11/26 with cough, shortness of breath nausea and vomiting, hypoxic to 89%, required intubation for mechanical ventilation. Chest x-ray showed complete whiteout of left lung.  Bronchoscopy showed purulent secretions on the left which were suctioned out. History of snorting crushed oxycodone, use of hot tub Blood cultures showed Rothia Mucilaginosa, urine tested positive for Legionella, bronchoscopy was positive for Pseudomonas and stenotrophomonas .  ID consult was obtained she was treated with cefepime and levofloxacin and nebulized tobramycin .  Course was complicated by AKI and bicarbonate drip was started She was transferred to [facility] for ARDS management and possible need for ECMO  Pertinent  Medical History    Significant Hospital Events: Including procedures, antibiotic start and stop dates in addition to other pertinent events   CTA chest -complete opacification of left lung, multiple pulmonary nodules, mediastinal and right hilar lymphadenopathy 12/2 transfer to [facility]  Interim History / Subjective:  Overnight no acute events. Tmax 101.1. Remains on nimbex.   Objective   Pulse (!) 114, temperature 98.8 F (37.1 C), resp. rate (!) 30, last menstrual period 01/16/2016, SpO2 97 %. CVP:  [15 mmHg] 15 mmHg  Vent Mode: PRVC FiO2 (%):  [80 %-100 %] 100 % Set Rate:  [26 bmp-30 bmp] 30 bmp Vt Set:  [380 mL-420 mL] 420 mL PEEP:  [10 cmH20] 10 cmH20 Plateau Pressure:  [27 cmH20-32 cmH20] 32 cmH20   Intake/Output Summary (Last 24 hours) at 12/29/2021 [id number] Last data filed at 12/29/2021 0700 Gross per 24 hour  Intake 464.54 ml  Output 1170 ml  Net -705.46 ml   There were no vitals filed for this visit.  Examination: General:  Chronically ill-appearing woman lying in bed distress, intubated, sedated, Obese HENT: [place]/AT, eyes anicteric, endotracheal tube Lungs: Rales on the left, CTA on the right. Cardiovascular: S1-S2, tachycardic, regular rhythm Abdomen: distended, hypoactive bowel sounds Extremities: Significant peripheral edema, no cyanosis Neuro: RASS -5, endpoint pupils GU: Urine, Foley  K+ 6.1 BUN 122 Cr 2.45 Phos 6.5 7.32/47/93/24 WBC 24.6 H/H 9.2/30 Platelets 99 CXR personally reviewed> completely opacified L lung, RLL infiltrate. ETT ~ 1 cm above the carina.  Blood cultures: rothia mucilaginosa Resp culture: stenotrophomonas, pseudomonas  Echo 11/28 : LVEF 55-60%, normal RV.  Trivial pericardial effusion.  Mild TR, indeterminate number of cusps of aortic valve.  No significant valvular abnormalities otherwise.  Resolved Hospital Problem list     Assessment & Plan:  Polymicrobial pneumonia with complete whiteout of left lung, mild RLL pneumonia-- possibly due to hot tub exposures per s/o -Pseudomonas/stenotrophomonas -Legionella pneumonia -?  Aspiration with history of snorting oxycodone COPD by history- on advair and albuterol PTA -LTVV -VAP prevention protocol -PAD protocol for sedation -NMB & heavy sedation -daily SAT & SBT when appropriate-- FiO2 remains too high -Low tidal volume ventilation , goal plateau pressure less than 30, currently 26 and driving pressure less than 15 -Deep sedation goal RASS -3 to -4 with Versed/fentanyl while paralytic being used, will add enteral oxycodone and clonazepam -Continue cefepime, Levaquin, minocycline, inhaled tobi currently- ID consult - Steroids, bronchodilators -hypertonic saline nebs BID, CPT -recommend smoking cessation  Bacteremia - Rothia mucilaginosa- potentially related to her history of snorting oxycodone -ID consult -Continue current antibiotics -Anticipate she will require TEE when more  stable.  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 -no additional cardiology testing indicated per 11/29 cardiology note -recommend smoking cessation  Chronic anemia thrombocytopenia  likely 2/2 sepsis -transfuse for Hb <7 or hemodynamically significant bleeding -monitor  Best Practice (right click and "Reselect all SmartList Selections" daily)   Diet/type: tubefeeds DVT prophylaxis: prophylactic heparin  GI prophylaxis: PPI Lines: Central line Foley:  Yes, and it is still needed Code Status:  full code Last date of multidisciplinary goals of care discussion [s/o updated at bedside, full scope of care]  Labs   CBC: Recent Labs  Lab 12/22/21 1836 12/23/21 0411 12/26/21 0307 12/27/21 0433 12/28/21 0412 12/28/21 1743 12/28/21 1754 12/29/21 0327 12/29/21 0334  WBC 6.0   < > 13.5* 16.9* 23.4*  --  21.8* 24.6*  --   NEUTROABS 5.2  --   --   --   --   --  20.7*  --   --   HGB 11.3*   < > 9.5* 9.6* 8.8* 10.5* 9.6* 9.2* 9.2*  HCT 32.7*   < > 28.8* 29.7* 27.8* 31.0* 30.8* 30.0* 27.0*  MCV 91.6   < > 93.8 96.4 101.1*  --  102.3* 102.4*  --   PLT 217   < > 146* 106* 90*  --  99* 99*  --    < > = values in this interval not displayed.     Basic Metabolic Panel: Recent Labs  Lab 12/25/21 0349 12/26/21 0307 12/27/21 0433 12/27/21 1601 12/28/21 0412 12/28/21 1743 12/28/21 1754 12/28/21 2205 12/29/21 0327 12/29/21 0334  NA 137 145 148* 144 143 144 145  145 145 143  K 3.8 3.9 4.8 5.1 4.9 5.9* 6.1* 6.3* 6.1* 5.9*  CL 111 118* 119* 110 108  --  114* 114* 111  --   CO2 19* 24 23 27 28   --  25 25 24   --   GLUCOSE 137* 131* 173* 346* 428*  --  166* 149* 126*  --   BUN 47* 53* 80* 86* 88*  --  106* 114* 122*  --   CREATININE 0.79 0.71 1.15* 1.43* 1.70*  --  2.29* 2.43* 2.45*  --   CALCIUM 9.2 8.8* 9.0 8.0* 7.3*  --  8.2* 8.1* 8.2*  --   MG 2.8* 2.6* 2.8*  --   --   --  2.4  --  2.6*  --   PHOS 3.8 4.0 5.6*  --   --   --  5.9*  --  6.5*  --     GFR: Estimated Creatinine Clearance: 28.6 mL/min (A) (by C-G formula based on SCr of 2.45 mg/dL (H)). Recent Labs  Lab 12/22/21 2205 12/23/21 0411 12/24/21 0503 12/25/21 0349 12/26/21 0307 12/27/21 0433 12/27/21 1647 12/27/21 1930 12/28/21 0412 12/28/21 1754 12/29/21 0327  PROCALCITON  --   --  19.32 13.40  --  5.19  --   --  3.67  --   --   WBC  --  5.5 8.2 12.5*   < > 16.9*  --   --  23.4* 21.8* 24.6*  LATICACIDVEN 5.6* 2.8*  --   --   --   --  1.1 1.5  --   --   --    < > = values in this interval not displayed.     Critical care time:    This patient is critically ill with multiple organ system failure which requires frequent high complexity decision making, assessment, support, evaluation, and titration of therapies. This was completed through the application of advanced monitoring technologies and extensive interpretation of multiple databases. During this encounter critical care time was devoted to patient care services described in this note for 50 minutes.  Steffanie Dunn, DO 12/29/21 9:57 AM [facility]  For contact information, see Amion. If no response to pager, please call PCCM consult pager. After hours, 7PM- 7AM, please call [facility].

## 2021-12-29 NOTE — Progress Notes (Signed)
    Latest Ref Rng & Units 12/29/2021    3:29 PM 12/29/2021    3:34 AM 12/29/2021    3:27 AM  BMP  Glucose 70 - 99 mg/dL 628   315   BUN 6 - 20 mg/dL 176   160   Creatinine 0.44 - 1.00 mg/dL 7.37   1.06   Sodium 269 - 145 mmol/L 144  143  145   Potassium 3.5 - 5.1 mmol/L 5.7  5.9  6.1   Chloride 98 - 111 mmol/L 111   111   CO2 22 - 32 mmol/L 22   24   Calcium 8.9 - 10.3 mg/dL 8.0   8.2    UOP after lasix this morning: ~1400cc 1 dose of lokelma this morning.  Will give 2 more doses of lokelma today/ tonight and repeat lasix is scheduled for later today. Remains at risk for needing RRT in the coming days if her renal failure does not improve.  Steffanie Dunn, DO 12/29/21 5:00 PM [facility]

## 2021-12-29 NOTE — Consult Note (Signed)
[facility]                                                                                       Patient Identification: Patient Name: Janet Hensley MRN: [id number] Admit Date: 12/28/2021  5:14 PM Today's Date: 12/29/2021 Reason for consult: abtx recs for polymicrobial organisms  Requesting provider: Noemi Chapel   Principal Problem:   ARDS (adult respiratory distress syndrome) ([place])   Antibiotics:  Doxycyline 11/26-11/29, Azithromycin 11/29-11/30 Ceftriaxone 11/26-11/29, Cefepime 11/29- Levofloxacin 12/1- Minocycline 12/1- Tobramycin 12/1-  Valacyclovir  10/30   Lines/Hardware: RT arm PICC+, RT fem art iine   Assessment 51 year old female with PMH of asthma, chronic back pain, leg pain, trigeminal neuralgia and smoker who presented to the ED on 11/26 at [facility] with progressing shortness of breath, cough with nausea and vomiting. Transferred to [facility] for higher level of care   # Acute respiratory failure 2/2 severe polymicrobial post obstructive  pna with complete white out of left lung and possible aspiration with h/o snorting crushed oxycodone as well as hot tub ( PsA, Stenotrophomonas maltophilia and Legionella pneumophilia). 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 Please call with questions or concerns.  Thank you for the consult  Rosiland Oz, MD Infectious Disease  Physician [facility] [address] [place], [place] [address] Phone: [phone]  Fax: [phone]  __________________________________________________________________________________________________________ HPI and Hospital Course:( History per chart review as patient is intubated, no family at bedside) 51 year old female with PMH of asthma/COPD, chronic back pain, leg pain, trigeminal neuralgia and smoker who presented to the ED on 11/26 at [facility] with progressing shortness of breath, cough with nausea and vomiting. Seen in the [facility] ED for cough and respiratory symptoms, Negative COVID and flu and was diagnosed with a viral illness.  She was getting worse including left-sided chest pain and SOB but she did not want to come to the hospital.  11/26  came to ED because of worsening shortness of breath, chest pain. Patient has history of snorting crushed oxycodone as well as hot tub.   At ED afebrile, no leukocytosis Labs remarkable for potassium 2.7, Cr 0.83, albumin less than 1.5, AST 168, lactic acid 5.1 11/26 blood cx 1/2 sers rothia mucilaginosa 11/27 RVP negative  11/27 urine cx NG , urine positive for L pneumophilia serogrp 1 , strep pneumo ag negative  11/28 bronchoscopy Thick mucoid purulent secretions from left lung segments. CX PsA and strenotrophomonas maltophilia  11/29 BAL PsA and stenotrophomonas maltophilia, Legionella DFA negative , cx pending  12/1 rsp cx GNR 12/1 blood cx [facility]  course complicated by AKI, bicarbonate started.  She was transferred to [facility] for higher level of care and possible need for ECMO  Imagings as below   ROS: unavailable due to clinical condition  Past Medical History:  Diagnosis Date   Asthma 01/15/2012   Past Surgical History:  Procedure Laterality Date   LEFT HEART CATH AND CORONARY ANGIOGRAPHY N/A 12/24/2021   Procedure: LEFT HEART CATH AND CORONARY ANGIOGRAPHY;  Surgeon: Nelva Bush, MD;  Location: [place] [facility];  Service: Cardiovascular;  Laterality: N/A;     Scheduled Meds:  artificial tears  1 Application Both Eyes G2B   Chlorhexidine Gluconate Cloth  6 each Topical Daily   docusate  100 mg Per Tube BID   famotidine  20 mg Per Tube QHS   heparin  5,000 Units Subcutaneous Q8H   ipratropium-albuterol  3 mL Nebulization Q6H   methylPREDNISolone (SOLU-MEDROL) injection  40 mg Intravenous Q12H   minocycline  100 mg Per Tube BID   mouth rinse  15 mL Mouth Rinse Q2H   pantoprazole (PROTONIX) IV  40 mg Intravenous QHS   polyethylene glycol  17 g Per Tube Daily   sodium chloride HYPERTONIC  4 mL Nebulization BID   sodium zirconium cyclosilicate  10 g Per Tube BID   tobramycin (PF)  300 mg Nebulization BID   valACYclovir  1,000 mg Per Tube BID   Continuous Infusions:  ceFEPime (MAXIPIME) IV Stopped (12/28/21 2141)   cisatracurium (NIMBEX) 200 mg in sodium chloride 0.9 % 100 mL (2 mg/mL) infusion 2 mcg/kg/min (12/29/21 0900)   fentaNYL infusion INTRAVENOUS 175 mcg/hr (12/29/21 0900)   levofloxacin (LEVAQUIN) IV 100 mL/hr at 12/29/21 0900   midazolam 6 mg/hr (12/29/21 0900)   phenylephrine (NEO-SYNEPHRINE) Adult infusion Stopped (12/28/21 1810)   PRN Meds:.acetaminophen, cisatracurium (NIMBEX) injection, docusate, fentaNYL, midazolam, midazolam, mouth rinse, polyethylene glycol  No Known Allergies  Social History   Socioeconomic History   Marital status: Legally Separated    Spouse name: Not on file   Number of children: Not on file   Years of education: Not on file   Highest education level: Not on file  Occupational History   Not on file  Tobacco Use   Smoking status: Not on file   Smokeless tobacco: Not on file  Substance and Sexual Activity   Alcohol use: Not on file   Drug use: Not on file   Sexual activity: Not on file  Other Topics Concern   Not on file  Social History Narrative   Not on file   Social Determinants of Health    Financial Resource Strain: Not on file  Food Insecurity: Not on file  Transportation Needs: Not on file  Physical Activity: Not on file  Stress: Not on file  Social Connections: Not on file  Intimate Partner Violence: Not on file   No family history on file.  Vitals BP 132/65   Pulse (!) 123   Temp 98.2 F (36.8 C)   Resp (!) 30   LMP 01/16/2016   SpO2 96%    Physical Exam Constitutional:  middle aged female lying in the bed and unsresponsive    Comments: PERRLA, Intubated and sedated (oral mucosa dry with dry blood, no secretions in ETT, dubhuff+  Cardiovascular:     Rate and Rhythm: Normal rate and regular rhythm.     Heart sounds:   Pulmonary:     Effort: Pulmonary effort is normal on vent, fio2 90%    Comments: coarse  breath sounds bilaterally   Abdominal:     Palpations: Abdomen is soft.     Tenderness: non distended , [place] +  Musculoskeletal:        General: No swelling or tenderness in peripheral joints, Back unable to examine   Skin:    Comments: no obvious lesions or rashes anteriorly. RT arm PICC with no warmth/swelling/cellulitis  Neurological:     General: unresponsive   Pertinent Microbiology Results for orders placed or performed during the hospital encounter of 12/22/21  Blood Culture (routine x 2)     Status: Abnormal   Collection Time: 12/22/21  6:36 PM   Specimen: BLOOD  Result Value Ref Range Status   Specimen Description   Final    BLOOD RIGHT ANTECUBITAL Performed at [facility], [place] [address], [place], [place] [address]    Special Requests   Final    BOTTLES DRAWN AEROBIC AND ANAEROBIC Blood Culture adequate volume Performed at [facility], [address] [place], [place] [address]    Culture  Setup Time   Final    GRAM POSITIVE COCCI AEROBIC BOTTLE ONLY CRITICAL RESULT CALLED TO, READ BACK BY AND VERIFIED WITH: DEVON MITCHELLE 12/23/21 1438 MW    Culture (A)  Final    ROTHIA MUCILAGINOSA Standardized  susceptibility testing for this organism is not available. Performed at [facility], [place] [address], [place], [place] [address]    Report Status 12/24/2021 FINAL  Final  Blood Culture ID Panel (Reflexed)     Status: None   Collection Time: 12/22/21  6:36 PM  Result Value Ref Range Status   Enterococcus faecalis NOT DETECTED NOT DETECTED Final   Enterococcus Faecium NOT DETECTED NOT DETECTED Final   Listeria monocytogenes NOT DETECTED NOT DETECTED Final   Staphylococcus species NOT DETECTED NOT DETECTED Final   Staphylococcus aureus (BCID) NOT DETECTED NOT DETECTED Final   Staphylococcus epidermidis NOT DETECTED NOT DETECTED Final   Staphylococcus lugdunensis NOT DETECTED NOT DETECTED Final   Streptococcus species NOT DETECTED NOT DETECTED Final   Streptococcus agalactiae NOT DETECTED NOT DETECTED Final   Streptococcus pneumoniae NOT DETECTED NOT DETECTED Final   Streptococcus pyogenes NOT DETECTED NOT DETECTED Final   A.calcoaceticus-baumannii NOT DETECTED NOT DETECTED Final   Bacteroides fragilis NOT DETECTED NOT DETECTED Final   Enterobacterales NOT DETECTED NOT DETECTED Final   Enterobacter cloacae complex NOT DETECTED NOT DETECTED Final   Escherichia coli NOT DETECTED NOT DETECTED Final   Klebsiella aerogenes NOT DETECTED NOT DETECTED Final   Klebsiella oxytoca NOT DETECTED NOT DETECTED Final   Klebsiella pneumoniae NOT DETECTED NOT DETECTED Final   Proteus species NOT DETECTED NOT DETECTED Final   Salmonella species NOT DETECTED NOT DETECTED Final   Serratia marcescens NOT DETECTED NOT DETECTED Final   Haemophilus influenzae NOT DETECTED NOT DETECTED Final   Neisseria meningitidis NOT DETECTED NOT DETECTED Final   Pseudomonas aeruginosa NOT DETECTED NOT DETECTED Final   Stenotrophomonas maltophilia NOT DETECTED NOT DETECTED Final   Candida albicans NOT DETECTED NOT DETECTED Final   Candida auris NOT DETECTED NOT DETECTED Final   Candida glabrata NOT DETECTED NOT  DETECTED Final   Candida krusei NOT DETECTED NOT DETECTED Final   Candida parapsilosis NOT DETECTED NOT DETECTED Final   Candida tropicalis NOT DETECTED NOT DETECTED Final   Cryptococcus neoformans/gattii NOT DETECTED NOT DETECTED Final    Comment: Performed at [facility], [address], [place], [place] [address]  SARS Coronavirus 2 by RT  PCR (hospital order, performed in [facility] hospital lab) *cepheid single result test* Anterior Nasal Swab     Status: None   Collection Time: 12/22/21  6:50 PM   Specimen: Anterior Nasal Swab  Result Value Ref Range Status   SARS Coronavirus 2 by RT PCR NEGATIVE NEGATIVE Final    Comment: (NOTE) SARS-CoV-2 target nucleic acids are NOT DETECTED.  The SARS-CoV-2 RNA is generally detectable in upper and lower respiratory specimens during the acute phase of infection. The lowest concentration of SARS-CoV-2 viral copies this assay can detect is 250 copies / mL. A negative result does not preclude SARS-CoV-2 infection and should not be used as the sole basis for treatment or other patient management decisions.  A negative result may occur with improper specimen collection / handling, submission of specimen other than nasopharyngeal swab, presence of viral mutation(s) within the areas targeted by this assay, and inadequate number of viral copies (<250 copies / mL). A negative result must be combined with clinical observations, patient history, and epidemiological information.  Fact Sheet for Patients:   https://www.patel.info/  Fact Sheet for Healthcare Providers: https://hall.com/  This test is not yet approved or  cleared by the [place] FDA and has been authorized for detection and/or diagnosis of SARS-CoV-2 by FDA under an Emergency Use Authorization (EUA).  This EUA will remain in effect (meaning this test can be used) for the duration of the COVID-19 declaration under Section  564(b)(1) of the Act, 21 U.S.C. section 360bbb-3(b)(1), unless the authorization is terminated or revoked sooner.  Performed at [facility], [address] [place], [place] [address]   MRSA Next Gen by PCR, Nasal     Status: None   Collection Time: 12/22/21 10:55 PM   Specimen: Nasal Mucosa; Nasal Swab  Result Value Ref Range Status   MRSA by PCR Next Gen NOT DETECTED NOT DETECTED Final    Comment: (NOTE) The GeneXpert MRSA Assay (FDA approved for NASAL specimens only), is one component of a comprehensive MRSA colonization surveillance program. It is not intended to diagnose MRSA infection nor to guide or monitor treatment for MRSA infections. Test performance is not FDA approved in patients less than 47 years old. Performed at [facility], [address] [place], [place] [address]   Blood Culture (routine x 2)     Status: None   Collection Time: 12/22/21 11:15 PM   Specimen: BLOOD LEFT HAND  Result Value Ref Range Status   Specimen Description BLOOD LEFT HAND  Final   Special Requests IN PEDIATRIC BOTTLE Blood Culture adequate volume  Final   Culture   Final    NO GROWTH 5 DAYS Performed at [facility], [address] [place], [place] [address]    Report Status 12/27/2021 FINAL  Final  Respiratory (~20 pathogens) panel by PCR     Status: None   Collection Time: 12/23/21 12:01 AM  Result Value Ref Range Status   Adenovirus NOT DETECTED NOT DETECTED Final   Coronavirus 229E NOT DETECTED NOT DETECTED Final    Comment: (NOTE) The Coronavirus on the Respiratory Panel, DOES NOT test for the novel  Coronavirus (2019 nCoV)    Coronavirus HKU1 NOT DETECTED NOT DETECTED Final   Coronavirus NL63 NOT DETECTED NOT DETECTED Final   Coronavirus OC43 NOT DETECTED NOT DETECTED Final   Metapneumovirus NOT DETECTED NOT DETECTED Final   Rhinovirus / Enterovirus NOT DETECTED NOT DETECTED Final   Influenza A NOT DETECTED NOT DETECTED Final  Influenza B  NOT DETECTED NOT DETECTED Final   Parainfluenza Virus 1 NOT DETECTED NOT DETECTED Final   Parainfluenza Virus 2 NOT DETECTED NOT DETECTED Final   Parainfluenza Virus 3 NOT DETECTED NOT DETECTED Final   Parainfluenza Virus 4 NOT DETECTED NOT DETECTED Final   Respiratory Syncytial Virus NOT DETECTED NOT DETECTED Final   Bordetella pertussis NOT DETECTED NOT DETECTED Final   Bordetella Parapertussis NOT DETECTED NOT DETECTED Final   Chlamydophila pneumoniae NOT DETECTED NOT DETECTED Final   Mycoplasma pneumoniae NOT DETECTED NOT DETECTED Final    Comment: Performed at [facility], [place] [address] [place], [place] [address]  Urine Culture     Status: None   Collection Time: 12/23/21  4:11 AM   Specimen: In/Out Cath Urine  Result Value Ref Range Status   Specimen Description   Final    IN/OUT CATH URINE Performed at [facility] [address], [place], [place] [address]    Special Requests   Final    NONE Performed at [facility], [address], [place], [place] [address]    Culture   Final    NO GROWTH Performed at [facility], [place] [address], [place], [place] [address]    Report Status 12/24/2021 FINAL  Final  Culture, Respiratory w Gram Stain     Status: None   Collection Time: 12/23/21  1:32 PM   Specimen: Tracheal Aspirate; Respiratory  Result Value Ref Range Status   Specimen Description   Final    TRACHEAL ASPIRATE Performed at [facility], [address], [place], [place] [address]    Special Requests   Final    NONE Performed at [facility], [address] [place], [place] [address]    Gram Stain   Final    NO WBC SEEN NO ORGANISMS SEEN Performed at [facility], [place] [address], [place], [place] [address]    Culture   Final    RARE PSEUDOMONAS AERUGINOSA RARE STENOTROPHOMONAS MALTOPHILIA    Report Status 12/27/2021 FINAL  Final   Organism ID, Bacteria PSEUDOMONAS AERUGINOSA  Final   Organism ID,  Bacteria STENOTROPHOMONAS MALTOPHILIA  Final      Susceptibility   Pseudomonas aeruginosa - MIC*    CEFTAZIDIME 4 SENSITIVE Sensitive     CIPROFLOXACIN <=0.25 SENSITIVE Sensitive     GENTAMICIN <=1 SENSITIVE Sensitive     IMIPENEM 1 SENSITIVE Sensitive     PIP/TAZO 8 SENSITIVE Sensitive     CEFEPIME 2 SENSITIVE Sensitive     * RARE PSEUDOMONAS AERUGINOSA   Stenotrophomonas maltophilia - MIC*    LEVOFLOXACIN 0.25 SENSITIVE Sensitive     TRIMETH/SULFA <=20 SENSITIVE Sensitive     * RARE STENOTROPHOMONAS MALTOPHILIA  Culture, BAL-quantitative w Gram Stain     Status: Abnormal (Preliminary result)   Collection Time: 12/25/21 11:46 AM   Specimen: Bronchoalveolar Lavage; Respiratory  Result Value Ref Range Status   Specimen Description   Final    BRONCHIAL ALVEOLAR LAVAGE Performed at [facility], [address], [place], [place] [address]    Special Requests   Final    Normal Performed at [facility], [address] [place], [place] [address]    Gram Stain   Final    NO WBC SEEN MODERATE GRAM NEGATIVE RODS Performed at [facility], [place] [address] [place], [place] [address]    Culture (A)  Final    >=100,000 COLONIES/mL PSEUDOMONAS AERUGINOSA  80,000 COLONIES/mL STENOTROPHOMONAS MALTOPHILIA Sent to [place] for further susceptibility testing. PSEUDOMONAS AERUGINOSA    Report Status PENDING  Incomplete   Organism ID, Bacteria PSEUDOMONAS AERUGINOSA (A)  Final   Organism ID, Bacteria STENOTROPHOMONAS MALTOPHILIA (A)  Final      Susceptibility   Pseudomonas aeruginosa - MIC*    CEFTAZIDIME 4 SENSITIVE Sensitive     CIPROFLOXACIN <=0.25 SENSITIVE Sensitive     GENTAMICIN 2 SENSITIVE Sensitive     IMIPENEM 1 SENSITIVE Sensitive     PIP/TAZO 8 SENSITIVE Sensitive     CEFEPIME 2 SENSITIVE Sensitive     * >=100,000 COLONIES/mL PSEUDOMONAS AERUGINOSA   Stenotrophomonas maltophilia - MIC*    LEVOFLOXACIN 0.25 SENSITIVE Sensitive     TRIMETH/SULFA <=20  SENSITIVE Sensitive     * 80,000 COLONIES/mL STENOTROPHOMONAS MALTOPHILIA  Legionella Pneumophila/Culture     Status: None (Preliminary result)   Collection Time: 12/25/21 11:46 AM   Specimen: Bronchial Alveolar Lavage; Respiratory  Result Value Ref Range Status   Legionella Pneumophila DFA Negative Negative Final    Comment: (NOTE) Performed At: [facility] [place], [place] [id number] Rush Farmer MD [id number]    Legionella Species Culture PENDING  Incomplete   Source, Legionella Cul BRONCHIAL ALVEOLAR LAVAGE  Final    Comment: Performed at [facility], [address] [place], [place] [address]  Culture, Respiratory w Gram Stain     Status: None (Preliminary result)   Collection Time: 12/27/21  4:01 PM   Specimen: Tracheal Aspirate; Respiratory  Result Value Ref Range Status   Specimen Description   Final    TRACHEAL ASPIRATE Performed at [facility], [address] [place], [place] [address]    Special Requests   Final    NONE Performed at [facility], [address] [place], [place] [address]    Gram Stain   Final    RARE WBC PRESENT,BOTH PMN AND MONONUCLEAR FEW GRAM NEGATIVE RODS    Culture   Final    CULTURE REINCUBATED FOR BETTER GROWTH Performed at [facility], [place] [address], [place], [place] [address]    Report Status PENDING  Incomplete  Culture, blood (Routine X 2) w Reflex to ID Panel     Status: None (Preliminary result)   Collection Time: 12/27/21  8:18 PM   Specimen: BLOOD  Result Value Ref Range Status   Specimen Description BLOOD LAC  Final   Special Requests   Final    BOTTLES DRAWN AEROBIC AND ANAEROBIC Blood Culture adequate volume   Culture   Final    NO GROWTH 2 DAYS Performed at [facility], [address], [place], [place] [address]    Report Status PENDING  Incomplete  Culture, blood (Routine X 2) w Reflex to ID Panel     Status: None (Preliminary result)    Collection Time: 12/27/21  8:25 PM   Specimen: BLOOD  Result Value Ref Range Status   Specimen Description BLOOD [facility] [address]  Final   Special Requests   Final    BOTTLES DRAWN AEROBIC AND ANAEROBIC Blood Culture adequate volume   Culture   Final    NO GROWTH 2 DAYS Performed at [facility], [address] [place], [place] [address]    Report Status PENDING  Incomplete    Pertinent Lab seen by me:    Latest Ref Rng & Units 12/29/2021    3:34 AM 12/29/2021    3:27 AM 12/28/2021  5:54 PM  CBC  WBC 4.0 - 10.5 K/uL  24.6  21.8   Hemoglobin 12.0 - 15.0 g/dL 9.2  9.2  9.6   Hematocrit 36.0 - 46.0 % 27.0  30.0  30.8   Platelets 150 - 400 K/uL  99  99       Latest Ref Rng & Units 12/29/2021    3:34 AM 12/29/2021    3:27 AM 12/28/2021   10:05 PM  CMP  Glucose 70 - 99 mg/dL  126  149   BUN 6 - 20 mg/dL  122  114   Creatinine 0.44 - 1.00 mg/dL  2.45  2.43   Sodium 135 - 145 mmol/L 143  145  145   Potassium 3.5 - 5.1 mmol/L 5.9  6.1  6.3   Chloride 98 - 111 mmol/L  111  114   CO2 22 - 32 mmol/L  24  25   Calcium 8.9 - 10.3 mg/dL  8.2  8.1     Pertinent Imagings/Other Imagings Plain films and CT images have been personally visualized and interpreted; radiology reports have been reviewed. Decision making incorporated into the Impression / Recommendations.  DG Chest Port 1 View  Result Date: 12/29/2021 CLINICAL DATA:  Acute respiratory failure. Endotracheally intubated. EXAM: PORTABLE CHEST 1 VIEW COMPARISON:  12/28/2021 FINDINGS: Support lines and tubes in appropriate position. Diffuse consolidation of the left lung is again seen. Airspace opacity in the peripheral right lower lung is also unchanged. No pneumothorax visualized. IMPRESSION: No significant change in diffuse left lung consolidation and right lower lung airspace opacity. No pneumothorax visualized. Electronically Signed   By: Marlaine Hind M.D.   On: 12/29/2021 09:17   DG CHEST PORT 1 VIEW  Result Date:  12/28/2021 CLINICAL DATA:  Intubation EXAM: PORTABLE CHEST 1 VIEW COMPARISON:  December 28, 2021 FINDINGS: The ETT is in good position terminating 3.7 cm above the carina. The right PICC line terminates in the central SVC. The NG tube terminates below today's film. Focal infiltrate remains in the lateral right lung base. The left lung is almost completely opacified with only a tiny amount of aerated lung in the central lung. The cardiomediastinal silhouette is not well assessed due to left-sided opacity. Opacity in the medial right upper lobe is stable. No other interval changes. IMPRESSION: 1. Support apparatus as above. 2. Persistent infiltrate in the lateral right lung base. 3. Near complete opacification of the left lung with only a tiny amount of aerated lung in the central lung. 4. Stable opacity in the medial right upper lobe. 5. No other changes. Electronically Signed   By: Dorise Bullion III M.D.   On: 12/28/2021 18:07   DG Chest Port 1 View  Result Date: 12/28/2021 CLINICAL DATA:  [id number] with hypoxic respiratory failure. EXAM: PORTABLE CHEST 1 VIEW COMPARISON:  Portable chest yesterday at 10:28 a.m. FINDINGS: 4:54 a.m. ETT interval pullback to 4.5 cm from the carina, mid tracheal in good position. NGT enters the stomach with the intragastric course not filmed. Right PICC terminates at about the superior cavoatrial junction. Left hemithorax remains completely opacified except for a small area of minimal aeration in the mid perihilar area. Left shift of the mediastinum suggests much of this is probably atelectasis and possibly mucous plugging. Underlying pneumonia is probably present as well based on the recent CTA. Pleural fluid component is not excluded but was not present on the CTA chest 12/22/2021. There is again noted patchy dense airspace disease in the right lower lung  field, most likely due to pneumonia with the right mid and upper lung remaining clear. The heart is obscured but has not  previously been enlarged. Central vessels are normal caliber. No pneumothorax. Stable overall aeration. IMPRESSION: 1. ETT in good position. 2. Left hemithorax remains almost completely opacified with left shift of the mediastinum suggesting an atelectatic component with possible mucous plugging with likely pneumonia as well based on the recent CTA. Pleural fluid component is not excluded but was not present on the CTA chest 12/22/2021. 3. Stable patchy airspace disease in the right lower lung field. Electronically Signed   By: Telford Nab M.D.   On: 12/28/2021 07:23   DG Chest Port 1 View  Result Date: 12/27/2021 CLINICAL DATA:  Acute respiratory failure with hypoxia EXAM: PORTABLE CHEST 1 VIEW COMPARISON:  12/26/2021 FINDINGS: Endotracheal tube with tip 15 mm above the carina. The enteric tube reaches the stomach. Continued and even slightly worsened white out of the left chest. Unchanged infiltrate at the right lung base. No effusion or air leak detected. IMPRESSION: 1. ETT with tip 15 mm above the carina. 2. Left more than right pneumonia with white out of the left chest. No significant change from yesterday. Electronically Signed   By: Jorje Guild M.D.   On: 12/27/2021 10:46   [name] EKG SITE RITE  Result Date: 12/26/2021 If Site Rite image not attached, placement could not be confirmed due to current cardiac rhythm.  DG Chest Port 1 View  Result Date: 12/26/2021 CLINICAL DATA:  Respiratory failure.  Intubated patient. EXAM: PORTABLE CHEST 1 VIEW COMPARISON:  12/25/2021 FINDINGS: Endotracheal tube is 1.7 cm above the carina. NG tube extends into the abdomen but the tip is beyond the image. Again noted is near complete opacification of the left hemithorax. Slightly improved aeration along the lateral aspect of left lung. Increased airspace densities in the right lower lung. Cardiac silhouette is obscured by the lung densities. Negative for pneumothorax. IMPRESSION: 1. Persistent opacification  throughout the left lung with slightly improved aeration along the lateral aspect. 2. Increased airspace densities in the right lower lung. Electronically Signed   By: Markus Daft M.D.   On: 12/26/2021 08:33   DG Chest Port 1 View  Result Date: 12/25/2021 CLINICAL DATA:  Respiratory failure. EXAM: PORTABLE CHEST 1 VIEW COMPARISON:  Chest x-ray 12/23/2021 and chest CT 12/22/2021 FINDINGS: The endotracheal tube is 2 cm above the carina. The NG tube is coursing down the esophagus and into the stomach. External pacer paddles are noted. Persistent opacification of the left hemithorax due to a drowned/obstructed lung. No significant pleural effusions seen on the recent CT scan. The right lung remains relatively clear. Persistent streaky right basilar infiltrates but no pleural effusion. IMPRESSION: 1. Persistent opacification of the left hemithorax. 2. Persistent streaky right basilar infiltrates. 3. Stable support apparatus. Electronically Signed   By: Marijo Sanes M.D.   On: 12/25/2021 12:44   DG Abd 1 View  Result Date: 12/25/2021 CLINICAL DATA:  OG tube placement. EXAM: ABDOMEN - 1 VIEW COMPARISON:  Radiograph 12/23/2021 FINDINGS: Tip and side port of the enteric tube below the diaphragm in the stomach. Nonspecific upper abdominal bowel gas pattern with generalized paucity of bowel gas. IMPRESSION: Tip and side port of the enteric tube below the diaphragm in the stomach. Electronically Signed   By: Keith Rake M.D.   On: 12/25/2021 12:39   ECHOCARDIOGRAM COMPLETE  Result Date: 12/25/2021    ECHOCARDIOGRAM REPORT   Patient Name:  Shaune Spittle Date of Exam: 12/24/2021 Medical Rec #:  [id number]     Height:       63.0 in Accession #:    [id number]    Weight:       183.2 lb Date of Birth:  Lauran 15, 1972     BSA:          1.863 m Patient Age:    69 years      BP:           99/54 mmHg Patient Gender: F             HR:           97 bpm. Exam Location:  [facility] Procedure: 2D Echo, Cardiac Doppler and Color  Doppler Indications:     I42.9 Cardiomyopathy  History:         Patient has no prior history of Echocardiogram examinations.  Sonographer:     Cresenciano Lick RDCS Referring Phys:  [id number] CHRISTOPHER END Diagnosing Phys: Nelva Bush MD IMPRESSIONS  1. Left ventricular ejection fraction, by estimation, is 55 to 60%. The left ventricle has normal function. Left ventricular endocardial border not optimally defined to evaluate regional wall motion. Left ventricular diastolic parameters were normal.  2. Right ventricular systolic function is normal. The right ventricular size is normal.  3. The mitral valve is normal in structure. No evidence of mitral valve regurgitation. No evidence of mitral stenosis.  4. The aortic valve has an indeterminant number of cusps. Aortic valve regurgitation is not visualized. No aortic stenosis is present. FINDINGS  Left Ventricle: Left ventricular ejection fraction, by estimation, is 55 to 60%. The left ventricle has normal function. Left ventricular endocardial border not optimally defined to evaluate regional wall motion. The left ventricular internal cavity size was normal in size. There is no left ventricular hypertrophy. Left ventricular diastolic parameters were normal. Right Ventricle: The right ventricular size is normal. No increase in right ventricular wall thickness. Right ventricular systolic function is normal. Left Atrium: Left atrial size was normal in size. Right Atrium: Right atrial size was normal in size. Pericardium: Trivial pericardial effusion is present. Mitral Valve: The mitral valve is normal in structure. No evidence of mitral valve regurgitation. No evidence of mitral valve stenosis. Tricuspid Valve: The tricuspid valve is normal in structure. Tricuspid valve regurgitation is mild. Aortic Valve: The aortic valve has an indeterminant number of cusps. Aortic valve regurgitation is not visualized. No aortic stenosis is present. Aortic valve mean gradient  measures 6.1 mmHg. Aortic valve peak gradient measures 11.1 mmHg. Aortic valve area, by VTI measures 2.90 cm. Pulmonic Valve: The pulmonic valve was grossly normal. Pulmonic valve regurgitation is trivial. No evidence of pulmonic stenosis. Aorta: The aortic root and ascending aorta are structurally normal, with no evidence of dilitation. Pulmonary Artery: The pulmonary artery is of normal size. Venous: IVC assessment for right atrial pressure unable to be performed due to mechanical ventilation. IAS/Shunts: The interatrial septum was not well visualized.  LEFT VENTRICLE PLAX 2D LVIDd:         4.60 cm   Diastology LVIDs:         3.10 cm   LV e' medial:    11.50 cm/s LV PW:         0.70 cm   LV E/e' medial:  9.6 LV IVS:        0.60 cm   LV e' lateral:   10.95 cm/s LVOT diam:     2.10 cm  LV E/e' lateral: 10.1 LV SV:         66 LV SV Index:   36 LVOT Area:     3.46 cm  RIGHT VENTRICLE             IVC RV Basal diam:  3.20 cm     IVC diam: 1.90 cm RV S prime:     17.35 cm/s TAPSE (M-mode): 2.4 cm LEFT ATRIUM             Index        RIGHT ATRIUM           Index LA diam:        4.20 cm 2.25 cm/m   RA Area:     10.20 cm LA Vol (A2C):   46.0 ml 24.69 ml/m  RA Volume:   22.20 ml  11.92 ml/m LA Vol (A4C):   19.9 ml 10.68 ml/m LA Biplane Vol: 32.9 ml 17.66 ml/m  AORTIC VALVE AV Area (Vmax):    2.62 cm AV Area (Vmean):   2.37 cm AV Area (VTI):     2.90 cm AV Vmax:           166.83 cm/s AV Vmean:          118.032 cm/s AV VTI:            0.229 m AV Peak Grad:      11.1 mmHg AV Mean Grad:      6.1 mmHg LVOT Vmax:         126.33 cm/s LVOT Vmean:        80.933 cm/s LVOT VTI:          0.192 m LVOT/AV VTI ratio: 0.84  AORTA Ao Root diam: 3.40 cm Ao Asc diam:  2.90 cm MITRAL VALVE MV Area (PHT): 4.15 cm     SHUNTS MV Decel Time: 183 msec     Systemic VTI:  0.19 m MV E velocity: 110.50 cm/s  Systemic Diam: 2.10 cm MV A velocity: 101.25 cm/s MV E/A ratio:  1.09 Harrell Gave End MD Electronically signed by Nelva Bush MD  Signature Date/Time: 12/25/2021/7:20:13 AM    Final    CARDIAC CATHETERIZATION  Result Date: 12/24/2021 Conclusions: Mild, 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  Question vasospasm of the proximal RCA (intracoronary nitroglycerin not administered in the setting of hypotension).  No lesion identified to explain changes noted on EKG. Normal left ventricular systolic function (LVEF 54-62%) with mildly elevated filling pressure (LVEDP 20 mmHg). Recommendations: Follow-up echocardiogram. Medical therapy and risk factor modification to prevent progression of mild coronary artery disease. Continued management of noncardiogenic shock and respiratory failure per primary team. Nelva Bush, MD [facility]  CT HEAD WO CONTRAST (5MM)  Result Date: 12/23/2021 CLINICAL DATA:  Altered mental status EXAM: CT HEAD WITHOUT CONTRAST TECHNIQUE: Contiguous axial images were obtained from the base of the skull through the vertex without intravenous contrast. RADIATION DOSE REDUCTION: This exam was performed according to the departmental dose-optimization program which includes automated exposure control, adjustment of the mA and/or kV according to patient size and/or use of iterative reconstruction technique. COMPARISON:  None Available. FINDINGS: Brain: No evidence of acute infarction, hemorrhage, hydrocephalus, extra-axial collection or mass lesion/mass effect. Vascular: No hyperdense vessel or unexpected calcification. Skull: Changes of prior craniotomy in the right posterior fossa. Sinuses/Orbits: No acute finding. Other: None. IMPRESSION: No acute intracranial abnormality noted. Electronically Signed   By: Inez Catalina M.D.   On: 12/23/2021 23:02  DG Abd 1 View  Result Date: 12/23/2021 CLINICAL DATA:  Respiratory failure, OG tube placement EXAM: ABDOMEN - 1 VIEW COMPARISON:  None Available. FINDINGS: Orogastric tube tip and side port overlie the stomach. No evidence of bowel obstruction.  IMPRESSION: Orogastric tube tip and side port overlie the stomach. Electronically Signed   By: Maurine Simmering M.D.   On: 12/23/2021 12:35   DG Chest Port 1 View  Result Date: 12/23/2021 CLINICAL DATA:  Respiratory failure EXAM: PORTABLE CHEST 1 VIEW COMPARISON:  Radiograph 12/22/2021 FINDINGS: Endotracheal tube overlies the trachea approximately 2.9 cm above the carina. Orogastric tube passes below the diaphragm, tip excluded by collimation. Persistent complete opacification of the left hemithorax. Unchanged patchy opacities in the right mid to lower lung. No evidence of pneumothorax. Bones are unchanged. IMPRESSION: Endotracheal tube overlies the trachea approximately 2.9 cm above the carina. Unchanged complete opacification of the left hemithorax and patchy airspace disease in the right lung. Electronically Signed   By: Maurine Simmering M.D.   On: 12/23/2021 12:34   CT Angio Chest PE W/Cm &/Or Wo Cm  Result Date: 12/22/2021 CLINICAL DATA:  Labored breathing; PE suspected EXAM: CT ANGIOGRAPHY CHEST WITH CONTRAST TECHNIQUE: Multidetector CT imaging of the chest was performed using the standard protocol during bolus administration of intravenous contrast. Multiplanar CT image reconstructions and MIPs were obtained to evaluate the vascular anatomy. RADIATION DOSE REDUCTION: This exam was performed according to the departmental dose-optimization program which includes automated exposure control, adjustment of the mA and/or kV according to patient size and/or use of iterative reconstruction technique. CONTRAST:  41m OMNIPAQUE IOHEXOL 350 MG/ML SOLN COMPARISON:  Radiographs earlier today FINDINGS: Cardiovascular: Satisfactory opacification of the central pulmonary arteries. Respiratory motion decreases sensitivity for evaluation of pulmonary embolism in the segmental pulmonary arteries. No evidence of pulmonary embolism. Normal heart size. No pericardial effusion. Mediastinum/Nodes: Numerous enlarged mediastinal and  right hilar nodes. For example right paratracheal node measuring 1.4 cm and right hilar node measuring 1.3 cm. The left hilum is silhouetted by adjacent atelectasis/consolidation of the left lung. Unremarkable esophagus. Normal thyroid. Lungs/Pleura: Near-complete opacification of the left lung with low-attenuation fluid/infiltrate. There are a few areas of peribronchovascular crowding suggesting atelectasis however overall lung volume is largely preserved. Segmental bronchial narrowing/occlusion in the right upper lobe, lingula, and right lower lobe. Trace left pleural effusion. No pneumothorax. Paraseptal emphysema in the apices. Innumerable small pulmonary nodules in the right lower lobe and right middle lobe in a peribronchovascular distribution. More confluence nodules with subtle adjacent ground-glass opacity in the right upper lobe measuring up to 18.5 mm. Upper Abdomen: Hepatic steatosis. Round hypoattenuating lesion in the liver is not significantly changed from 2015 and should be benign cysts. No follow-up recommended. Cyst in the right kidney. No follow-up recommended. Musculoskeletal: No chest wall abnormality. No acute osseous findings. Review of the MIP images confirms the above findings. IMPRESSION: 1. Near-complete opacification of the left lung with low-attenuation fluid/infiltrate. This is likely in part due to pneumonia possibly postobstructive in nature given mucus and secretions in multiple segmental bronchi throughout the left lung. However given the mediastinal adenopathy and nodularity in the right lung underlying malignancy is difficult to exclude. Short interval follow-up after treatment is recommended. 2. Multiple indeterminate pulmonary nodules in the right lung may be infectious/inflammatory however metastases are not excluded. 3. Mediastinal and right hilar adenopathy. Electronically Signed   By: TPlacido SouM.D.   On: 12/22/2021 20:35   DG Chest Port 1 View  Result Date:  12/22/2021 CLINICAL DATA:  Possible sepsis. EXAM: PORTABLE CHEST 1 VIEW COMPARISON:  None Available. FINDINGS: Opacification of the LEFT hemithorax noted and may represent consolidation, atelectasis and/or pleural effusion. The RIGHT lung is clear. There is no evidence of pneumothorax.  No midline shift noted. IMPRESSION: Opacification of the LEFT hemithorax which may represent consolidation, atelectasis and/or pleural effusion. Electronically Signed   By: Margarette Canada M.D.   On: 12/22/2021 19:06     I spent 110 minutes for this patient encounter including review of prior medical records/discussing diagnostics and treatment plan with the patient/family/coordinate care with primary/other specialits with greater than 50% of time in face to face encounter.   Electronically signed by:   Rosiland Oz, MD Infectious Disease Physician [facility] Pager: [phone]

## 2021-12-30 ENCOUNTER — Inpatient Hospital Stay (HOSPITAL_COMMUNITY): Payer: 59

## 2021-12-30 DIAGNOSIS — N179 Acute kidney failure, unspecified: Secondary | ICD-10-CM | POA: Diagnosis not present

## 2021-12-30 DIAGNOSIS — J9602 Acute respiratory failure with hypercapnia: Secondary | ICD-10-CM | POA: Diagnosis not present

## 2021-12-30 DIAGNOSIS — J9601 Acute respiratory failure with hypoxia: Secondary | ICD-10-CM | POA: Diagnosis not present

## 2021-12-30 DIAGNOSIS — J80 Acute respiratory distress syndrome: Secondary | ICD-10-CM | POA: Diagnosis not present

## 2021-12-30 LAB — CBC
HCT: 30.4 % — ABNORMAL LOW (ref 36.0–46.0)
Hemoglobin: 9.7 g/dL — ABNORMAL LOW (ref 12.0–15.0)
MCH: 31.5 pg (ref 26.0–34.0)
MCHC: 31.9 g/dL (ref 30.0–36.0)
MCV: 98.7 fL (ref 80.0–100.0)
Platelets: 127 10*3/uL — ABNORMAL LOW (ref 150–400)
RBC: 3.08 MIL/uL — ABNORMAL LOW (ref 3.87–5.11)
RDW: 15.1 % (ref 11.5–15.5)
WBC: 25.7 10*3/uL — ABNORMAL HIGH (ref 4.0–10.5)
nRBC: 0.2 % (ref 0.0–0.2)

## 2021-12-30 LAB — BASIC METABOLIC PANEL
Anion gap: 14 (ref 5–15)
BUN: 157 mg/dL — ABNORMAL HIGH (ref 6–20)
CO2: 24 mmol/L (ref 22–32)
Calcium: 8.3 mg/dL — ABNORMAL LOW (ref 8.9–10.3)
Chloride: 109 mmol/L (ref 98–111)
Creatinine, Ser: 2.83 mg/dL — ABNORMAL HIGH (ref 0.44–1.00)
GFR, Estimated: 20 mL/min — ABNORMAL LOW (ref >60)
Glucose, Bld: 172 mg/dL — ABNORMAL HIGH (ref 70–99)
Potassium: 5.3 mmol/L — ABNORMAL HIGH (ref 3.5–5.1)
Sodium: 147 mmol/L — ABNORMAL HIGH (ref 135–145)

## 2021-12-30 LAB — GLUCOSE, CAPILLARY
Glucose-Capillary: 114 mg/dL — ABNORMAL HIGH (ref 70–99)
Glucose-Capillary: 119 mg/dL — ABNORMAL HIGH (ref 70–99)
Glucose-Capillary: 156 mg/dL — ABNORMAL HIGH (ref 70–99)
Glucose-Capillary: 159 mg/dL — ABNORMAL HIGH (ref 70–99)
Glucose-Capillary: 173 mg/dL — ABNORMAL HIGH (ref 70–99)
Glucose-Capillary: 175 mg/dL — ABNORMAL HIGH (ref 70–99)

## 2021-12-30 LAB — MAGNESIUM: Magnesium: 2.5 mg/dL — ABNORMAL HIGH (ref 1.7–2.4)

## 2021-12-30 LAB — PHOSPHORUS: Phosphorus: 6.6 mg/dL — ABNORMAL HIGH (ref 2.5–4.6)

## 2021-12-30 MED ORDER — NEPRO/CARBSTEADY PO LIQD
1000.0000 mL | ORAL | Status: DC
  Administered 2021-12-30 – 2022-01-05 (×3): 1000 mL
  Filled 2021-12-30 (×2): qty 1000

## 2021-12-30 MED ORDER — PROSOURCE TF20 ENFIT COMPATIBL EN LIQD
60.0000 mL | Freq: Every day | ENTERAL | Status: DC
  Administered 2021-12-30 – 2022-01-08 (×10): 60 mL
  Filled 2021-12-30 (×10): qty 60

## 2021-12-30 MED ORDER — SODIUM CHLORIDE 0.9 % IV SOLN
2.0000 g | Freq: Three times a day (TID) | INTRAVENOUS | Status: DC
  Administered 2021-12-30 – 2022-01-01 (×6): 2 g via INTRAVENOUS
  Filled 2021-12-30 (×8): qty 2000

## 2021-12-30 MED ORDER — FREE WATER
200.0000 mL | Status: DC
  Administered 2021-12-30 – 2021-12-31 (×4): 200 mL

## 2021-12-30 MED ORDER — LACTULOSE 10 GM/15ML PO SOLN
20.0000 g | Freq: Two times a day (BID) | ORAL | Status: DC
  Administered 2021-12-30 – 2022-01-02 (×7): 20 g
  Filled 2021-12-30 (×7): qty 30

## 2021-12-30 MED ORDER — MINOCYCLINE HCL 50 MG PO CAPS
200.0000 mg | ORAL_CAPSULE | Freq: Two times a day (BID) | ORAL | Status: DC
  Administered 2021-12-30 – 2022-01-02 (×7): 200 mg
  Filled 2021-12-30 (×5): qty 4
  Filled 2021-12-30: qty 2
  Filled 2021-12-30 (×2): qty 4
  Filled 2021-12-30: qty 2
  Filled 2021-12-30: qty 4

## 2021-12-30 NOTE — Procedures (Signed)
Bronchoscopy Procedure Note  Janet Hensley  [id number]  Oct 06, 1970  Date:12/30/21  Time:2:14 PM   Provider Performing:Nea Gittens   Procedure(s):  Flexible bronchoscopy with bronchial alveolar lavage (70177) and Initial Therapeutic Aspiration of Tracheobronchial Tree (93903)  Indication(s) Acute respiratory failure with mucus plugging  Consent Risks of the procedure as well as the alternatives and risks of each were explained to the patient and/or caregiver.  Consent for the procedure was obtained and is signed in the bedside chart  Anesthesia Nimbex, Versed and Dilaudid   Time Out Verified patient identification, verified procedure, site/side was marked, verified correct patient position, special equipment/implants available, medications/allergies/relevant history reviewed, required imaging and test results available.   Sterile Technique Usual hand hygiene, masks, gowns, and gloves were used   Procedure Description Bronchoscope advanced through endotracheal tube and into airway.  Airways were examined down to subsegmental level with findings noted below.   Following diagnostic evaluation, BAL(s) performed in bilateral lower lobe with normal saline and return of yellowish fluid and Therapeutic aspiration performed in right lower lobe and left lower lobe  Findings: Copious amount of tenacious secretions noted in bilateral lower lobe with severely inflamed mucosa all over   Complications/Tolerance None; patient tolerated the procedure well. Chest X-ray is needed post procedure.   EBL Minimal   Specimen(s) BAL

## 2021-12-30 NOTE — Progress Notes (Signed)
NAME:  Janet Hensley, MRN:  [id number], DOB:  March 09, 1970, LOS: 2 ADMISSION DATE:  12/28/2021, CONSULTATION DATE:  12/30/2021  REFERRING MD:  Willette Cluster, CHIEF COMPLAINT: Severe pneumonia  History of Present Illness:  51 year old smoker presented to [facility] 11/26 with cough, shortness of breath nausea and vomiting, hypoxic to 89%, required intubation for mechanical ventilation. Chest x-ray showed complete whiteout of left lung.  Bronchoscopy showed purulent secretions on the left which were suctioned out. History of snorting crushed oxycodone, use of hot tub Blood cultures showed Rothia Mucilaginosa, urine tested positive for Legionella, bronchoscopy was positive for Pseudomonas and stenotrophomonas .  ID consult was obtained she was treated with cefepime and levofloxacin and nebulized tobramycin .  Course was complicated by AKI and bicarbonate drip was started She was transferred to [facility] for ARDS management and possible need for ECMO  Pertinent  Medical History    Significant Hospital Events: Including procedures, antibiotic start and stop dates in addition to other pertinent events   CTA chest -complete opacification of left lung, multiple pulmonary nodules, mediastinal and right hilar lymphadenopathy 12/2 transfer to [facility]  Interim History / Subjective:  Patient is afebrile Remains on nimbex with Versed and midazolam infusions  Objective   Blood pressure 130/64, pulse (!) 120, temperature 98.1 F (36.7 C), resp. rate (!) 30, last menstrual period 01/16/2016, SpO2 97 %.    Vent Mode: PRVC FiO2 (%):  [90 %] 90 % Set Rate:  [30 bmp] 30 bmp Vt Set:  [420 mL] 420 mL PEEP:  [10 cmH20] 10 cmH20 Plateau Pressure:  [28 cmH20-30 cmH20] 28 cmH20   Intake/Output Summary (Last 24 hours) at 12/30/2021 0801 Last data filed at 12/30/2021 0600 Gross per 24 hour  Intake 1606.72 ml  Output 5985 ml  Net -4378.28 ml   There were no vitals filed for this visit.  Examination:   Physical  exam: General: Crtitically ill-appearing female, lying on the bed orally intubated HEENT: [place]/AT, eyes anicteric.  ETT and OGT in place Neuro: Sedated, paralyzed, not following commands.  Eyes are closed.  Pupils 3 mm bilateral reactive to light Chest: Coarse breath sounds on right side, absent air entry in middle and lower left lung, no wheezes or rhonchi Heart: Tachycardic, regular rhythm, no murmurs or gallops Abdomen: Soft, nondistended, bowel sounds present Skin: No rash   K+ 5.3 Na 147 BUN 157 Cr 2.83 Phos 6.6 WBC 25.7 H/H 9.7/30 Platelets 127 CXR personally reviewed> again showing completely opacified L lung, RLL infiltrate. ETT ~ 1 cm above the carina.  Resolved Hospital Problem list     Assessment & Plan:  Acute hypoxic/hypercapnic respiratory failure with ARDS in the setting of polymicrobial pneumonia with complete whiteout of left lung, mild RLL pneumonia-- possibly due to hot tub exposures Pseudomonas/stenotrophomonas pneumonia Legionella pneumonia Probable aspiration with history of snorting oxycodone COPD by history- on advair and albuterol PTA Continue lung protective ventilation VAP prevention protocol PAD protocol for sedation with fentanyl and midazolam Continue Nimbex infusion Patient's P/F ratio is 103 consistent with moderate ARDS Not ready for SBT Peak, plateau and driving pressures are at goal Appreciate infectious disease follow-up Continue Levaquin, minocycline, inhaled tobramycin Cefepime was switched to ampicillin Continue Steroids, bronchodilators Will proceed with repeat bronchoscopy as patient has persistent whiteout of left lung Smoking cessation when able to  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 and polymicrobial bacteremia, POA Per history from significant other patient snorts oxycodone ID is following, recommend switching cefepime to ampicillin and continuing  other antibiotics as above  AKI due to septic ATN with  hypervolemia Hyperkalemia Hypernatremia Hyperphosphatemia Patient serum creatinine continue to rise She received multiple doses of albumin and Lasix without much improvement in urine output She also received Lokelma with improvement in serum potassium from 6.3 down to 5.3 Nephrology consulted Avoid nephrotoxic agent Monitor intake and output  Perioral herpes Continue valacyclovir   Chronic pain on chronic opiates PAD protocol Will resume orals when extubated  Hyperglycemia, pre-DM, obesity Patient hemoglobin A1c is 5.8 Continue sliding scale insulin with CBG goal 140-180  Initial ST elevations on EKG, no troponin elevation or no wall motion abnormalities on echo, 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 Continue watchful waiting  Anemia of chronic disease thrombocytopenia  likely 2/2 sepsis H&H remained stable around 9 and platelet counts are trending up Closely monitor CBC  Best Practice (right click and "Reselect all SmartList Selections" daily)   Diet/type: tubefeeds DVT prophylaxis: prophylactic heparin  GI prophylaxis: PPI Lines: Central line Foley:  Yes, and it is still needed Code Status:  full code Last date of multidisciplinary goals of care discussion [12/3: s/o updated at bedside, full scope of care]  Labs   CBC: Recent Labs  Lab 12/27/21 0433 12/28/21 0412 12/28/21 1743 12/28/21 1754 12/29/21 0327 12/29/21 0334 12/30/21 0323  WBC 16.9* 23.4*  --  21.8* 24.6*  --  25.7*  NEUTROABS  --   --   --  20.7*  --   --   --   HGB 9.6* 8.8* 10.5* 9.6* 9.2* 9.2* 9.7*  HCT 29.7* 27.8* 31.0* 30.8* 30.0* 27.0* 30.4*  MCV 96.4 101.1*  --  102.3* 102.4*  --  98.7  PLT 106* 90*  --  99* 99*  --  127*    Basic Metabolic Panel: Recent Labs  Lab 12/26/21 0307 12/27/21 0433 12/27/21 1601 12/28/21 1754 12/28/21 2205 12/29/21 0327 12/29/21 0334 12/29/21 1529 12/30/21 0323  NA 145 148*   < > 145 145 145 143 144 147*  K 3.9 4.8   < > 6.1* 6.3* 6.1* 5.9* 5.7* 5.3*  CL 118*  119*   < > 114* 114* 111  --  111 109  CO2 24 23   < > 25 25 24   --  22 24  GLUCOSE 131* 173*   < > 166* 149* 126*  --  175* 172*  BUN 53* 80*   < > 106* 114* 122*  --  140* 157*  CREATININE 0.71 1.15*   < > 2.29* 2.43* 2.45*  --  2.64* 2.83*  CALCIUM 8.8* 9.0   < > 8.2* 8.1* 8.2*  --  8.0* 8.3*  MG 2.6* 2.8*  --  2.4  --  2.6*  --   --  2.5*  PHOS 4.0 5.6*  --  5.9*  --  6.5*  --   --  6.6*   < > = values in this interval not displayed.   GFR: Estimated Creatinine Clearance: 24.8 mL/min (A) (by C-G formula based on SCr of 2.83 mg/dL (H)). Recent Labs  Lab 12/24/21 0503 12/25/21 0349 12/26/21 0307 12/27/21 0433 12/27/21 1647 12/27/21 1930 12/28/21 0412 12/28/21 1754 12/29/21 0327 12/30/21 0323  PROCALCITON 19.32 13.40  --  5.19  --   --  3.67  --   --   --   WBC 8.2 12.5*   < > 16.9*  --   --  23.4* 21.8* 24.6* 25.7*  LATICACIDVEN  --   --   --   --  1.1 1.5  --   --   --   --    < > = values in this interval not displayed.    Critical care time:    This patient is critically ill with multiple organ system failure which requires frequent high complexity decision making, assessment, support, evaluation, and titration of therapies. This was completed through the application of advanced monitoring technologies and extensive interpretation of multiple databases.  During this encounter critical care time was devoted to patient care services described in this note for 44 minutes.    Cheri Fowler, MD [facility] See Amion for pager If no response to pager, please call [phone] until 7pm After 7pm, Please call E-link [phone]

## 2021-12-30 NOTE — Progress Notes (Signed)
Per pt husband request, RT placed mepilex bandage on pt upper lip where tubeholder sits. Pt husband states it is irritating her sores on her lip that were present prior to admission.

## 2021-12-30 NOTE — Progress Notes (Signed)
Nutrition Follow-up  DOCUMENTATION CODES:   Obesity unspecified  INTERVENTION:   Tube Feeding via OG:  Plan to change to Nepro TF for now given persistent hyperkalemia and hyperphosphatemia. If CRRT initiated and/or if pt does not tolerate Nepro, will reassess formula Nepro at 40 ml/hr with Pro-Source TF20 60 mL daily This provides 1808 kcals, 98 g of protein, 700 mL of free water  Add free water flush; start with 200 mL q 4 hours. Total free water with TF at goal plus FWF: 1900 mL of free water  NUTRITION DIAGNOSIS:   Inadequate oral intake related to inability to eat as evidenced by NPO status.  Being addressed via TF   GOAL:   Patient will meet greater than or equal to 90% of their needs  Progressing  MONITOR:   TF tolerance  REASON FOR ASSESSMENT:   Consult Enteral/tube feeding initiation and management  ASSESSMENT:   51 y.o. female admits to [facility] related to SOB, nausea/vomiting, requiring mechanical ventilation. Pt transferred to [facility] for ARDS management and possible need for ECMO.   Pt remains on vent support, sedated on fentanyl and versed, paralyzed on nimbex. Not requiring any pressors  Bronch today with copious secretions with severely inflamed mucosa.   Vital High Protein at 10 ml/hr without issue Current TF providing minimal free water. OG tube in stomach per abd xray  UOP 6L in 24 hours with lasix, BUN/Creatinine worsening. +hypernatremia, hyperkalemia and hyperphosphatemia. Pt has been only receiving 10 ml/hr of TF and not likely contributing to increased potassium and phosphorus levels.   BS present, abdomen distended but soft. Last BM PTA. Family indicating pt has not had a BM in a week. Discussed with MD, plan to increase bowel regimen.   Plan to add free water flushes today. Plan to adjust TF formula as well. Discussed with Dr. Merrily Pew and MD ok with increasing TF rate as well  Labs: BUN 157 (H), Creatinine 2.83, sodium 147 (H), potassium  5.3 (improved on  Meds: ss novolog, colace, miralax, solumedrol, valtrex, Lokelma TID.     Diet Order:   Diet Order             Diet NPO time specified  Diet effective now                   EDUCATION NEEDS:   Not appropriate for education at this time  Skin:  Skin Assessment: Reviewed RN Assessment  Last BM:  PTA  Height:   Ht Readings from Last 1 Encounters:  12/23/21 5\' 3"  (1.6 m)    Weight:   Wt Readings from Last 1 Encounters:  12/28/21 88.2 kg    Ideal Body Weight:  52.3 kg  BMI:  There is no height or weight on file to calculate BMI.  Estimated Nutritional Needs:   Kcal:  1550-1830 kcals  Protein:  80-95  Fluid:  >/= 1.8 L   14/02/23 MS, RDN, LDN, CNSC Registered Dietitian 3 Clinical Nutrition RD Pager and On-Call Pager Number Located in [place]

## 2021-12-30 NOTE — Progress Notes (Signed)
[facility] for Infectious Disease   Reason for visit: Follow up on pneumonia  Interval History: fever curve trending down, WBC up to 25.7; remains intubated, sedated.   Husband at bedside Day 9 total antibiotics Day 4 levaquin Day 4 inhaled tobi Day 5 valacyclovir Day 4 minocycline Day 6 cefepime  Physical Exam: Constitutional:  Vitals:   12/30/21 0841 12/30/21 0900  BP:    Pulse: (!) 125 (!) 127  Resp: (!) 30 (!) 30  Temp:    SpO2: 96% 96%   patient appears in NAD Eyes: anicteric HENT: + ET Respiratory: respiratory effort on vent  Review of Systems: Unable to be assessed due to patient factors  Lab Results  Component Value Date   WBC 25.7 (H) 12/30/2021   HGB 9.7 (L) 12/30/2021   HCT 30.4 (L) 12/30/2021   MCV 98.7 12/30/2021   PLT 127 (L) 12/30/2021    Lab Results  Component Value Date   CREATININE 2.83 (H) 12/30/2021   BUN 157 (H) 12/30/2021   NA 147 (H) 12/30/2021   K 5.3 (H) 12/30/2021   CL 109 12/30/2021   CO2 24 12/30/2021    Lab Results  Component Value Date   ALT 46 (H) 12/28/2021   AST 51 (H) 12/28/2021   ALKPHOS 105 12/28/2021     Microbiology: Recent Results (from the past 240 hour(s))  Blood Culture (routine x 2)     Status: Abnormal   Collection Time: 12/22/21  6:36 PM   Specimen: BLOOD  Result Value Ref Range Status   Specimen Description   Final    BLOOD RIGHT ANTECUBITAL Performed at [facility], [address] [place], [place] [address]    Special Requests   Final    BOTTLES DRAWN AEROBIC AND ANAEROBIC Blood Culture adequate volume Performed at [facility], [address] [place], [place] [address]    Culture  Setup Time   Final    GRAM POSITIVE COCCI AEROBIC BOTTLE ONLY CRITICAL RESULT CALLED TO, READ BACK BY AND VERIFIED WITH: DEVON MITCHELLE 12/23/21 1438 MW    Culture (A)  Final    ROTHIA MUCILAGINOSA Standardized susceptibility testing for this organism is not available. Performed at [facility], [place] [address], [place], [place] [address]    Report Status 12/24/2021 FINAL  Final  Blood Culture ID Panel (Reflexed)     Status: None   Collection Time: 12/22/21  6:36 PM  Result Value Ref Range Status   Enterococcus faecalis NOT DETECTED NOT DETECTED Final   Enterococcus Faecium NOT DETECTED NOT DETECTED Final   Listeria monocytogenes NOT DETECTED NOT DETECTED Final   Staphylococcus species NOT DETECTED NOT DETECTED Final   Staphylococcus aureus (BCID) NOT DETECTED NOT DETECTED Final   Staphylococcus epidermidis NOT DETECTED NOT DETECTED Final   Staphylococcus lugdunensis NOT DETECTED NOT DETECTED Final   Streptococcus species NOT DETECTED NOT DETECTED Final   Streptococcus agalactiae NOT DETECTED NOT DETECTED Final   Streptococcus pneumoniae NOT DETECTED NOT DETECTED Final   Streptococcus pyogenes NOT DETECTED NOT DETECTED Final   A.calcoaceticus-baumannii NOT DETECTED NOT DETECTED Final   Bacteroides fragilis NOT DETECTED NOT DETECTED Final   Enterobacterales NOT DETECTED NOT DETECTED Final   Enterobacter cloacae complex NOT DETECTED NOT DETECTED Final   Escherichia coli NOT DETECTED NOT DETECTED Final   Klebsiella aerogenes NOT DETECTED NOT DETECTED Final   Klebsiella oxytoca NOT DETECTED NOT DETECTED Final   Klebsiella pneumoniae NOT DETECTED NOT DETECTED Final  Proteus species NOT DETECTED NOT DETECTED Final   Salmonella species NOT DETECTED NOT DETECTED Final   Serratia marcescens NOT DETECTED NOT DETECTED Final   Haemophilus influenzae NOT DETECTED NOT DETECTED Final   Neisseria meningitidis NOT DETECTED NOT DETECTED Final   Pseudomonas aeruginosa NOT DETECTED NOT DETECTED Final   Stenotrophomonas maltophilia NOT DETECTED NOT DETECTED Final   Candida albicans NOT DETECTED NOT DETECTED Final   Candida auris NOT DETECTED NOT DETECTED Final   Candida glabrata NOT DETECTED NOT DETECTED Final   Candida krusei NOT DETECTED NOT DETECTED Final   Candida  parapsilosis NOT DETECTED NOT DETECTED Final   Candida tropicalis NOT DETECTED NOT DETECTED Final   Cryptococcus neoformans/gattii NOT DETECTED NOT DETECTED Final    Comment: Performed at [facility], [place], [place] [address]  SARS Coronavirus 2 by RT PCR (hospital order, performed in [place] hospital lab) *cepheid single result test* Anterior Nasal Swab     Status: None   Collection Time: 12/22/21  6:50 PM   Specimen: Anterior Nasal Swab  Result Value Ref Range Status   SARS Coronavirus 2 by RT PCR NEGATIVE NEGATIVE Final    Comment: (NOTE) SARS-CoV-2 target nucleic acids are NOT DETECTED.  The SARS-CoV-2 RNA is generally detectable in upper and lower respiratory specimens during the acute phase of infection. The lowest concentration of SARS-CoV-2 viral copies this assay can detect is 250 copies / mL. A negative result does not preclude SARS-CoV-2 infection and should not be used as the sole basis for treatment or other patient management decisions.  A negative result may occur with improper specimen collection / handling, submission of specimen other than nasopharyngeal swab, presence of viral mutation(s) within the areas targeted by this assay, and inadequate number of viral copies (<250 copies / mL). A negative result must be combined with clinical observations, patient history, and epidemiological information.  Fact Sheet for Patients:   https://www.patel.info/  Fact Sheet for Healthcare Providers: https://hall.com/  This test is not yet approved or  cleared by the [place] FDA and has been authorized for detection and/or diagnosis of SARS-CoV-2 by FDA under an Emergency Use Authorization (EUA).  This EUA will remain in effect (meaning this test can be used) for the duration of the COVID-19 declaration under Section 564(b)(1) of the Act, 21 U.S.C. section 360bbb-3(b)(1), unless the authorization is  terminated or revoked sooner.  Performed at [facility], [address] [place], [place] [address]   MRSA Next Gen by PCR, Nasal     Status: None   Collection Time: 12/22/21 10:55 PM   Specimen: Nasal Mucosa; Nasal Swab  Result Value Ref Range Status   MRSA by PCR Next Gen NOT DETECTED NOT DETECTED Final    Comment: (NOTE) The GeneXpert MRSA Assay (FDA approved for NASAL specimens only), is one component of a comprehensive MRSA colonization surveillance program. It is not intended to diagnose MRSA infection nor to guide or monitor treatment for MRSA infections. Test performance is not FDA approved in patients less than 57 years old. Performed at [facility], [address] [place], [place] [address]   Blood Culture (routine x 2)     Status: None   Collection Time: 12/22/21 11:15 PM   Specimen: BLOOD LEFT HAND  Result Value Ref Range Status   Specimen Description BLOOD LEFT HAND  Final   Special Requests IN PEDIATRIC BOTTLE Blood Culture adequate volume  Final   Culture   Final  NO GROWTH 5 DAYS Performed at [facility], [address] [place], [place] [address]    Report Status 12/27/2021 FINAL  Final  Respiratory (~20 pathogens) panel by PCR     Status: None   Collection Time: 12/23/21 12:01 AM  Result Value Ref Range Status   Adenovirus NOT DETECTED NOT DETECTED Final   Coronavirus 229E NOT DETECTED NOT DETECTED Final    Comment: (NOTE) The Coronavirus on the Respiratory Panel, DOES NOT test for the novel  Coronavirus (2019 nCoV)    Coronavirus HKU1 NOT DETECTED NOT DETECTED Final   Coronavirus NL63 NOT DETECTED NOT DETECTED Final   Coronavirus OC43 NOT DETECTED NOT DETECTED Final   Metapneumovirus NOT DETECTED NOT DETECTED Final   Rhinovirus / Enterovirus NOT DETECTED NOT DETECTED Final   Influenza A NOT DETECTED NOT DETECTED Final   Influenza B NOT DETECTED NOT DETECTED Final   Parainfluenza Virus 1 NOT DETECTED NOT DETECTED  Final   Parainfluenza Virus 2 NOT DETECTED NOT DETECTED Final   Parainfluenza Virus 3 NOT DETECTED NOT DETECTED Final   Parainfluenza Virus 4 NOT DETECTED NOT DETECTED Final   Respiratory Syncytial Virus NOT DETECTED NOT DETECTED Final   Bordetella pertussis NOT DETECTED NOT DETECTED Final   Bordetella Parapertussis NOT DETECTED NOT DETECTED Final   Chlamydophila pneumoniae NOT DETECTED NOT DETECTED Final   Mycoplasma pneumoniae NOT DETECTED NOT DETECTED Final    Comment: Performed at [facility], [place] [place], [place] [address]  Urine Culture     Status: None   Collection Time: 12/23/21  4:11 AM   Specimen: In/Out Cath Urine  Result Value Ref Range Status   Specimen Description   Final    IN/OUT CATH URINE Performed at [facility], [address], [place], [place] [address]    Special Requests   Final    NONE Performed at [facility], [address] [place], [place] [address]    Culture   Final    NO GROWTH Performed at [facility], [place] [address], [place], [place] [address]    Report Status 12/24/2021 FINAL  Final  Culture, Respiratory w Gram Stain     Status: None   Collection Time: 12/23/21  1:32 PM   Specimen: Tracheal Aspirate; Respiratory  Result Value Ref Range Status   Specimen Description   Final    TRACHEAL ASPIRATE Performed at [facility], [address], [place], [place] [address]    Special Requests   Final    NONE Performed at [facility], [address] [place], [place] [address]    Gram Stain   Final    NO WBC SEEN NO ORGANISMS SEEN Performed at [facility], [place] [address] [place],  [address]    Culture   Final    RARE PSEUDOMONAS AERUGINOSA RARE STENOTROPHOMONAS MALTOPHILIA    Report Status 12/27/2021 FINAL  Final   Organism ID, Bacteria PSEUDOMONAS AERUGINOSA  Final   Organism ID, Bacteria STENOTROPHOMONAS MALTOPHILIA  Final      Susceptibility   Pseudomonas  aeruginosa - MIC*    CEFTAZIDIME 4 SENSITIVE Sensitive     CIPROFLOXACIN <=0.25 SENSITIVE Sensitive     GENTAMICIN <=1 SENSITIVE Sensitive     IMIPENEM 1 SENSITIVE Sensitive     PIP/TAZO 8 SENSITIVE Sensitive     CEFEPIME 2 SENSITIVE Sensitive     * RARE PSEUDOMONAS AERUGINOSA   Stenotrophomonas maltophilia - MIC*    LEVOFLOXACIN  0.25 SENSITIVE Sensitive     TRIMETH/SULFA <=20 SENSITIVE Sensitive     * RARE STENOTROPHOMONAS MALTOPHILIA  Culture, BAL-quantitative w Gram Stain     Status: Abnormal (Preliminary result)   Collection Time: 12/25/21 11:46 AM   Specimen: Bronchoalveolar Lavage; Respiratory  Result Value Ref Range Status   Specimen Description   Final    BRONCHIAL ALVEOLAR LAVAGE Performed at [facility], [address], [place], [place] [address]    Special Requests   Final    Normal Performed at [facility], [address] [place], [place] [address]    Gram Stain   Final    NO WBC SEEN MODERATE GRAM NEGATIVE RODS Performed at [facility], [place] [address], [place], [place] [address]    Culture (A)  Final    >=100,000 COLONIES/mL PSEUDOMONAS AERUGINOSA 80,000 COLONIES/mL STENOTROPHOMONAS MALTOPHILIA Sent to [place] for further susceptibility testing. PSEUDOMONAS AERUGINOSA    Report Status PENDING  Incomplete   Organism ID, Bacteria PSEUDOMONAS AERUGINOSA (A)  Final   Organism ID, Bacteria STENOTROPHOMONAS MALTOPHILIA (A)  Final      Susceptibility   Pseudomonas aeruginosa - MIC*    CEFTAZIDIME 4 SENSITIVE Sensitive     CIPROFLOXACIN <=0.25 SENSITIVE Sensitive     GENTAMICIN 2 SENSITIVE Sensitive     IMIPENEM 1 SENSITIVE Sensitive     PIP/TAZO 8 SENSITIVE Sensitive     CEFEPIME 2 SENSITIVE Sensitive     * >=100,000 COLONIES/mL PSEUDOMONAS AERUGINOSA   Stenotrophomonas maltophilia - MIC*    LEVOFLOXACIN 0.25 SENSITIVE Sensitive     TRIMETH/SULFA <=20 SENSITIVE Sensitive     * 80,000 COLONIES/mL STENOTROPHOMONAS MALTOPHILIA   Legionella Pneumophila/Culture     Status: None (Preliminary result)   Collection Time: 12/25/21 11:46 AM   Specimen: Bronchial Alveolar Lavage; Respiratory  Result Value Ref Range Status   Legionella Pneumophila DFA Negative Negative Final    Comment: (NOTE) Performed At: [facility] [place], [place] [id number] Rush Farmer MD [id number]    Legionella Species Culture PENDING  Incomplete   Source, Legionella Cul BRONCHIAL ALVEOLAR LAVAGE  Final    Comment: Performed at [facility], [address] [place], [place] [address]  Culture, Respiratory w Gram Stain     Status: None   Collection Time: 12/27/21  4:01 PM   Specimen: Tracheal Aspirate; Respiratory  Result Value Ref Range Status   Specimen Description   Final    TRACHEAL ASPIRATE Performed at [facility], [address] [place], [place] [address]    Special Requests   Final    NONE Performed at [facility], [address] [place], [place] [address]    Gram Stain   Final    RARE WBC PRESENT,BOTH PMN AND MONONUCLEAR FEW GRAM NEGATIVE RODS    Culture   Final    MODERATE STENOTROPHOMONAS MALTOPHILIA FEW PSEUDOMONAS AERUGINOSA SUSCEPTIBILITIES PERFORMED ON PREVIOUS CULTURE WITHIN THE LAST 5 DAYS. Performed at [facility], [place] [address], [place], [place] [address]    Report Status 12/29/2021 FINAL  Final  Culture, blood (Routine X 2) w Reflex to ID Panel     Status: None (Preliminary result)   Collection Time: 12/27/21  8:18 PM   Specimen: BLOOD  Result Value Ref Range Status   Specimen Description BLOOD LAC  Final   Special Requests   Final    BOTTLES DRAWN AEROBIC AND ANAEROBIC Blood Culture adequate volume   Culture   Final  NO GROWTH 3 DAYS Performed at [facility], [address] [place], [place] [address]    Report Status PENDING  Incomplete  Culture, blood (Routine X 2) w Reflex to ID Panel     Status: None (Preliminary  result)   Collection Time: 12/27/21  8:25 PM   Specimen: BLOOD  Result Value Ref Range Status   Specimen Description BLOOD [facility]  Final   Special Requests   Final    BOTTLES DRAWN AEROBIC AND ANAEROBIC Blood Culture adequate volume   Culture   Final    NO GROWTH 3 DAYS Performed at [facility], [address], [place],  [address]    Report Status PENDING  Incomplete    Impression/Plan:  1. Pneumonia - multiple organisms noted in cultures including the BAL and one in the blood.   On levaquin for Pseudomonas, Stenotrophomonas, legionella On cefepime for Rothia  Minocycline added for [facility] dual coverage. Inhaled tobi for Pseudomonas At this point, will continue with levaquin as above Will change the cefepime to ampicillin  2. 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  Medication monitoring - will continue to monitor the creat and dose adjust above as indicated.

## 2021-12-31 DIAGNOSIS — J9602 Acute respiratory failure with hypercapnia: Secondary | ICD-10-CM | POA: Diagnosis not present

## 2021-12-31 DIAGNOSIS — J80 Acute respiratory distress syndrome: Secondary | ICD-10-CM | POA: Diagnosis not present

## 2021-12-31 DIAGNOSIS — J9601 Acute respiratory failure with hypoxia: Secondary | ICD-10-CM | POA: Diagnosis not present

## 2021-12-31 DIAGNOSIS — N179 Acute kidney failure, unspecified: Secondary | ICD-10-CM | POA: Diagnosis not present

## 2021-12-31 LAB — COMPREHENSIVE METABOLIC PANEL
ALT: 43 U/L (ref 0–44)
AST: 40 U/L (ref 15–41)
Albumin: <1.5 g/dL — ABNORMAL LOW (ref 3.5–5.0)
Alkaline Phosphatase: 95 U/L (ref 38–126)
Anion gap: 11 (ref 5–15)
BUN: 158 mg/dL — ABNORMAL HIGH (ref 6–20)
CO2: 25 mmol/L (ref 22–32)
Calcium: 8.7 mg/dL — ABNORMAL LOW (ref 8.9–10.3)
Chloride: 115 mmol/L — ABNORMAL HIGH (ref 98–111)
Creatinine, Ser: 2.59 mg/dL — ABNORMAL HIGH (ref 0.44–1.00)
GFR, Estimated: 22 mL/min — ABNORMAL LOW (ref >60)
Glucose, Bld: 156 mg/dL — ABNORMAL HIGH (ref 70–99)
Potassium: 5.1 mmol/L (ref 3.5–5.1)
Sodium: 151 mmol/L — ABNORMAL HIGH (ref 135–145)
Total Bilirubin: 0.4 mg/dL (ref 0.3–1.2)
Total Protein: 5.5 g/dL — ABNORMAL LOW (ref 6.5–8.1)

## 2021-12-31 LAB — ACID FAST SMEAR (AFB, MYCOBACTERIA): Acid Fast Smear: NEGATIVE

## 2021-12-31 LAB — POCT I-STAT 7, (LYTES, BLD GAS, ICA,H+H)
Acid-Base Excess: 1 mmol/L (ref 0.0–2.0)
Bicarbonate: 25.5 mmol/L (ref 20.0–28.0)
Calcium, Ion: 1.22 mmol/L (ref 1.15–1.40)
HCT: 27 % — ABNORMAL LOW (ref 36.0–46.0)
Hemoglobin: 9.2 g/dL — ABNORMAL LOW (ref 12.0–15.0)
O2 Saturation: 97 %
Patient temperature: 37
Potassium: 4.8 mmol/L (ref 3.5–5.1)
Sodium: 151 mmol/L — ABNORMAL HIGH (ref 135–145)
TCO2: 27 mmol/L (ref 22–32)
pCO2 arterial: 39.2 mmHg (ref 32–48)
pH, Arterial: 7.421 (ref 7.35–7.45)
pO2, Arterial: 87 mmHg (ref 83–108)

## 2021-12-31 LAB — CBC WITH DIFFERENTIAL/PLATELET
Abs Immature Granulocytes: 0 10*3/uL (ref 0.00–0.07)
Basophils Absolute: 0 10*3/uL (ref 0.0–0.1)
Basophils Relative: 0 %
Eosinophils Absolute: 0 10*3/uL (ref 0.0–0.5)
Eosinophils Relative: 0 %
HCT: 29.6 % — ABNORMAL LOW (ref 36.0–46.0)
Hemoglobin: 9.8 g/dL — ABNORMAL LOW (ref 12.0–15.0)
Lymphocytes Relative: 2 %
Lymphs Abs: 0.6 10*3/uL — ABNORMAL LOW (ref 0.7–4.0)
MCH: 32.3 pg (ref 26.0–34.0)
MCHC: 33.1 g/dL (ref 30.0–36.0)
MCV: 97.7 fL (ref 80.0–100.0)
Monocytes Absolute: 0.8 10*3/uL (ref 0.1–1.0)
Monocytes Relative: 3 %
Neutro Abs: 26.3 10*3/uL — ABNORMAL HIGH (ref 1.7–7.7)
Neutrophils Relative %: 95 %
Platelets: 142 10*3/uL — ABNORMAL LOW (ref 150–400)
RBC: 3.03 MIL/uL — ABNORMAL LOW (ref 3.87–5.11)
RDW: 15 % (ref 11.5–15.5)
WBC: 27.7 10*3/uL — ABNORMAL HIGH (ref 4.0–10.5)
nRBC: 0 /100 WBC
nRBC: 0.1 % (ref 0.0–0.2)

## 2021-12-31 LAB — GLUCOSE, CAPILLARY
Glucose-Capillary: 124 mg/dL — ABNORMAL HIGH (ref 70–99)
Glucose-Capillary: 140 mg/dL — ABNORMAL HIGH (ref 70–99)
Glucose-Capillary: 143 mg/dL — ABNORMAL HIGH (ref 70–99)
Glucose-Capillary: 160 mg/dL — ABNORMAL HIGH (ref 70–99)
Glucose-Capillary: 191 mg/dL — ABNORMAL HIGH (ref 70–99)
Glucose-Capillary: 214 mg/dL — ABNORMAL HIGH (ref 70–99)

## 2021-12-31 LAB — CULTURE, RESPIRATORY W GRAM STAIN: Gram Stain: NONE SEEN

## 2021-12-31 LAB — IGE: IgE (Immunoglobulin E), Serum: 278 IU/mL (ref 6–495)

## 2021-12-31 LAB — ACID FAST CULTURE WITH REFLEXED SENSITIVITIES (MYCOBACTERIA)

## 2021-12-31 MED ORDER — FREE WATER
200.0000 mL | Status: DC
  Administered 2021-12-31 – 2022-01-01 (×7): 200 mL

## 2021-12-31 NOTE — Progress Notes (Signed)
[facility]   Reason for visit: follow up on pneumonia  Interval History:  had bronchoscopy yesterday with very difficult to suction secretions. FiO2 down to 70%.    Husband at bedside Day 10 total antibiotics Day 5 levaquin Day 5 inhaled tobi Day 6 valacyclovir Day 5 minocycline Day 1 ampicillin  Physical Exam: Constitutional:  Vitals:   12/31/21 1000 12/31/21 1122  BP:  132/66  Pulse: (!) 118 (!) 123  Resp: (!) 30 (!) 30  Temp:    SpO2: 94% 95%  She is in no acute distress Eyes: anicteric HENT: +ET Respiratory: respiratory effort on vent  Review of Systems: Unable to assess due to patient factors  Lab Results  Component Value Date   WBC 27.7 (H) 12/31/2021   HGB 9.2 (L) 12/31/2021   HCT 27.0 (L) 12/31/2021   MCV 97.7 12/31/2021   PLT 142 (L) 12/31/2021    Lab Results  Component Value Date   CREATININE 2.59 (H) 12/31/2021   BUN 158 (H) 12/31/2021   NA 151 (H) 12/31/2021   K 4.8 12/31/2021   CL 115 (H) 12/31/2021   CO2 25 12/31/2021    Lab Results  Component Value Date   ALT 43 12/31/2021   AST 40 12/31/2021   ALKPHOS 95 12/31/2021     Microbiology: Recent Results (from the past 240 hour(s))  Blood Culture (routine x 2)     Status: Abnormal   Collection Time: 12/22/21  6:36 PM   Specimen: BLOOD  Result Value Ref Range Status   Specimen Description   Final    BLOOD RIGHT ANTECUBITAL Performed at [facility], [place] [address] [place], [place] [address]    Special Requests   Final    BOTTLES DRAWN AEROBIC AND ANAEROBIC Blood Culture adequate volume Performed at [facility], [address] [place], [place] [address]    Culture  Setup Time   Final    GRAM POSITIVE COCCI AEROBIC BOTTLE ONLY CRITICAL RESULT CALLED TO, READ BACK BY AND VERIFIED WITH: DEVON MITCHELLE 12/23/21 1438 MW    Culture (A)  Final    ROTHIA MUCILAGINOSA Standardized susceptibility testing for this organism is not  available. Performed at [facility], [place] [address], [place], [place] [address]    Report Status 12/24/2021 FINAL  Final  Blood Culture ID Panel (Reflexed)     Status: None   Collection Time: 12/22/21  6:36 PM  Result Value Ref Range Status   Enterococcus faecalis NOT DETECTED NOT DETECTED Final   Enterococcus Faecium NOT DETECTED NOT DETECTED Final   Listeria monocytogenes NOT DETECTED NOT DETECTED Final   Staphylococcus species NOT DETECTED NOT DETECTED Final   Staphylococcus aureus (BCID) NOT DETECTED NOT DETECTED Final   Staphylococcus epidermidis NOT DETECTED NOT DETECTED Final   Staphylococcus lugdunensis NOT DETECTED NOT DETECTED Final   Streptococcus species NOT DETECTED NOT DETECTED Final   Streptococcus agalactiae NOT DETECTED NOT DETECTED Final   Streptococcus pneumoniae NOT DETECTED NOT DETECTED Final   Streptococcus pyogenes NOT DETECTED NOT DETECTED Final   A.calcoaceticus-baumannii NOT DETECTED NOT DETECTED Final   Bacteroides fragilis NOT DETECTED NOT DETECTED Final   Enterobacterales NOT DETECTED NOT DETECTED Final   Enterobacter cloacae complex NOT DETECTED NOT DETECTED Final   Escherichia coli NOT DETECTED NOT DETECTED Final   Klebsiella aerogenes NOT DETECTED NOT DETECTED Final   Klebsiella oxytoca NOT DETECTED NOT DETECTED Final   Klebsiella pneumoniae NOT DETECTED NOT DETECTED Final  Proteus species NOT DETECTED NOT DETECTED Final   Salmonella species NOT DETECTED NOT DETECTED Final   Serratia marcescens NOT DETECTED NOT DETECTED Final   Haemophilus influenzae NOT DETECTED NOT DETECTED Final   Neisseria meningitidis NOT DETECTED NOT DETECTED Final   Pseudomonas aeruginosa NOT DETECTED NOT DETECTED Final   Stenotrophomonas maltophilia NOT DETECTED NOT DETECTED Final   Candida albicans NOT DETECTED NOT DETECTED Final   Candida auris NOT DETECTED NOT DETECTED Final   Candida glabrata NOT DETECTED NOT DETECTED Final   Candida krusei NOT DETECTED NOT  DETECTED Final   Candida parapsilosis NOT DETECTED NOT DETECTED Final   Candida tropicalis NOT DETECTED NOT DETECTED Final   Cryptococcus neoformans/gattii NOT DETECTED NOT DETECTED Final    Comment: Performed at [facility], [place], [place] [address]  SARS Coronavirus 2 by RT PCR (hospital order, performed in [place] hospital lab) *cepheid single result test* Anterior Nasal Swab     Status: None   Collection Time: 12/22/21  6:50 PM   Specimen: Anterior Nasal Swab  Result Value Ref Range Status   SARS Coronavirus 2 by RT PCR NEGATIVE NEGATIVE Final    Comment: (NOTE) SARS-CoV-2 target nucleic acids are NOT DETECTED.  The SARS-CoV-2 RNA is generally detectable in upper and lower respiratory specimens during the acute phase of infection. The lowest concentration of SARS-CoV-2 viral copies this assay can detect is 250 copies / mL. A negative result does not preclude SARS-CoV-2 infection and should not be used as the sole basis for treatment or other patient management decisions.  A negative result may occur with improper specimen collection / handling, submission of specimen other than nasopharyngeal swab, presence of viral mutation(s) within the areas targeted by this assay, and inadequate number of viral copies (<250 copies / mL). A negative result must be combined with clinical observations, patient history, and epidemiological information.  Fact Sheet for Patients:   https://www.patel.info/  Fact Sheet for Healthcare Providers: https://hall.com/  This test is not yet approved or  cleared by the [place] FDA and has been authorized for detection and/or diagnosis of SARS-CoV-2 by FDA under an Emergency Use Authorization (EUA).  This EUA will remain in effect (meaning this test can be used) for the duration of the COVID-19 declaration under Section 564(b)(1) of the Act, 21 U.S.C. section 360bbb-3(b)(1),  unless the authorization is terminated or revoked sooner.  Performed at [facility], [address] [place], [place] [address]   MRSA Next Gen by PCR, Nasal     Status: None   Collection Time: 12/22/21 10:55 PM   Specimen: Nasal Mucosa; Nasal Swab  Result Value Ref Range Status   MRSA by PCR Next Gen NOT DETECTED NOT DETECTED Final    Comment: (NOTE) The GeneXpert MRSA Assay (FDA approved for NASAL specimens only), is one component of a comprehensive MRSA colonization surveillance program. It is not intended to diagnose MRSA infection nor to guide or monitor treatment for MRSA infections. Test performance is not FDA approved in patients less than 81 years old. Performed at [facility], [address] [place], [place] [address]   Blood Culture (routine x 2)     Status: None   Collection Time: 12/22/21 11:15 PM   Specimen: BLOOD LEFT HAND  Result Value Ref Range Status   Specimen Description BLOOD LEFT HAND  Final   Special Requests IN PEDIATRIC BOTTLE Blood Culture adequate volume  Final   Culture   Final  NO GROWTH 5 DAYS Performed at [facility], [address] [place], [place] [address]    Report Status 12/27/2021 FINAL  Final  Respiratory (~20 pathogens) panel by PCR     Status: None   Collection Time: 12/23/21 12:01 AM  Result Value Ref Range Status   Adenovirus NOT DETECTED NOT DETECTED Final   Coronavirus 229E NOT DETECTED NOT DETECTED Final    Comment: (NOTE) The Coronavirus on the Respiratory Panel, DOES NOT test for the novel  Coronavirus (2019 nCoV)    Coronavirus HKU1 NOT DETECTED NOT DETECTED Final   Coronavirus NL63 NOT DETECTED NOT DETECTED Final   Coronavirus OC43 NOT DETECTED NOT DETECTED Final   Metapneumovirus NOT DETECTED NOT DETECTED Final   Rhinovirus / Enterovirus NOT DETECTED NOT DETECTED Final   Influenza A NOT DETECTED NOT DETECTED Final   Influenza B NOT DETECTED NOT DETECTED Final   Parainfluenza Virus 1  NOT DETECTED NOT DETECTED Final   Parainfluenza Virus 2 NOT DETECTED NOT DETECTED Final   Parainfluenza Virus 3 NOT DETECTED NOT DETECTED Final   Parainfluenza Virus 4 NOT DETECTED NOT DETECTED Final   Respiratory Syncytial Virus NOT DETECTED NOT DETECTED Final   Bordetella pertussis NOT DETECTED NOT DETECTED Final   Bordetella Parapertussis NOT DETECTED NOT DETECTED Final   Chlamydophila pneumoniae NOT DETECTED NOT DETECTED Final   Mycoplasma pneumoniae NOT DETECTED NOT DETECTED Final    Comment: Performed at [facility], [place], [place], [place] [address]  Urine Culture     Status: None   Collection Time: 12/23/21  4:11 AM   Specimen: In/Out Cath Urine  Result Value Ref Range Status   Specimen Description   Final    IN/OUT CATH URINE Performed at [facility], [address], [place], [place] [address]    Special Requests   Final    NONE Performed at [facility], [address], [place], [place] [address]    Culture   Final    NO GROWTH Performed at [facility], [place] [address], [place], [place] [address]    Report Status 12/24/2021 FINAL  Final  Culture, Respiratory w Gram Stain     Status: None   Collection Time: 12/23/21  1:32 PM   Specimen: Tracheal Aspirate; Respiratory  Result Value Ref Range Status   Specimen Description   Final    TRACHEAL ASPIRATE Performed at [facility], [address], [place], [place] [address]    Special Requests   Final    NONE Performed at [facility], [address] [place], [place] [address]    Gram Stain   Final    NO WBC SEEN NO ORGANISMS SEEN Performed at [facility], [place] [address], [place],  [address]    Culture   Final    RARE PSEUDOMONAS AERUGINOSA RARE STENOTROPHOMONAS MALTOPHILIA    Report Status 12/27/2021 FINAL  Final   Organism ID, Bacteria PSEUDOMONAS AERUGINOSA  Final   Organism ID, Bacteria STENOTROPHOMONAS MALTOPHILIA  Final       Susceptibility   Pseudomonas aeruginosa - MIC*    CEFTAZIDIME 4 SENSITIVE Sensitive     CIPROFLOXACIN <=0.25 SENSITIVE Sensitive     GENTAMICIN <=1 SENSITIVE Sensitive     IMIPENEM 1 SENSITIVE Sensitive     PIP/TAZO 8 SENSITIVE Sensitive     CEFEPIME 2 SENSITIVE Sensitive     * RARE PSEUDOMONAS AERUGINOSA   Stenotrophomonas maltophilia - MIC*    LEVOFLOXACIN  0.25 SENSITIVE Sensitive     TRIMETH/SULFA <=20 SENSITIVE Sensitive     * RARE STENOTROPHOMONAS MALTOPHILIA  Culture, BAL-quantitative w Gram Stain     Status: Abnormal (Preliminary result)   Collection Time: 12/25/21 11:46 AM   Specimen: Bronchoalveolar Lavage; Respiratory  Result Value Ref Range Status   Specimen Description   Final    BRONCHIAL ALVEOLAR LAVAGE Performed at [facility], [address], [place], [place] [address]    Special Requests   Final    Normal Performed at [facility], [address] [place], [place] [address]    Gram Stain   Final    NO WBC SEEN MODERATE GRAM NEGATIVE RODS Performed at [facility], [place] [address], [place], [place] [address]    Culture (A)  Final    >=100,000 COLONIES/mL PSEUDOMONAS AERUGINOSA 80,000 COLONIES/mL STENOTROPHOMONAS MALTOPHILIA Sent to [place] for further susceptibility testing. PSEUDOMONAS AERUGINOSA    Report Status PENDING  Incomplete   Organism ID, Bacteria PSEUDOMONAS AERUGINOSA (A)  Final   Organism ID, Bacteria STENOTROPHOMONAS MALTOPHILIA (A)  Final      Susceptibility   Pseudomonas aeruginosa - MIC*    CEFTAZIDIME 4 SENSITIVE Sensitive     CIPROFLOXACIN <=0.25 SENSITIVE Sensitive     GENTAMICIN 2 SENSITIVE Sensitive     IMIPENEM 1 SENSITIVE Sensitive     PIP/TAZO 8 SENSITIVE Sensitive     CEFEPIME 2 SENSITIVE Sensitive     * >=100,000 COLONIES/mL PSEUDOMONAS AERUGINOSA   Stenotrophomonas maltophilia - MIC*    LEVOFLOXACIN 0.25 SENSITIVE Sensitive     TRIMETH/SULFA <=20 SENSITIVE Sensitive     * 80,000 COLONIES/mL  STENOTROPHOMONAS MALTOPHILIA  Legionella Pneumophila/Culture     Status: None (Preliminary result)   Collection Time: 12/25/21 11:46 AM   Specimen: Bronchial Alveolar Lavage; Respiratory  Result Value Ref Range Status   Legionella Pneumophila DFA Negative Negative Final    Comment: (NOTE) Performed At: [facility] [place], [place] [id number] Rush Farmer MD [id number]    Legionella Species Culture PENDING  Incomplete   Source, Legionella Cul BRONCHIAL ALVEOLAR LAVAGE  Final    Comment: Performed at [facility], [place], [place] [address]  Acid Fast Smear (AFB)     Status: None   Collection Time: 12/27/21  4:01 PM   Specimen: Tracheal Aspirate; Sputum  Result Value Ref Range Status   AFB Specimen Processing WRORD  Final    Comment: (NOTE) Test not performed. The required specimen for the test ordered was not received. Received:Serum Requires:Sputum, bronchial washing Antonieta Iba was notified 12/31/2021    Acid Fast Smear NOT PERFORMED  Final    Comment: (NOTE) Test not performed Performed At: [facility] [place], [place] [id number] Rush Farmer MD QI:[id number]    Source (AFB) BLOOD  Final    Comment: Performed at [facility], [address] [place], [place] [address]  Acid Fast Culture with reflexed sensitivities     Status: None   Collection Time: 12/27/21  4:01 PM   Specimen: Tracheal Aspirate; Sputum  Result Value Ref Range Status   Acid Fast Culture WRORD  Final    Comment: (NOTE) Test not performed. The required specimen for the test ordered was not received. Received:Serum Requires:Sputum, bronchial washing Antonieta Iba was notified 12/31/2021 Performed At: [facility] [place], [place] [id number] Rush Farmer MD [id number]    Source of Sample BLOOD  Final    Comment: Performed  at [facility], [address] [place], [place]  [address]  Culture, Respiratory w Gram Stain     Status: None   Collection Time: 12/27/21  4:01 PM   Specimen: Tracheal Aspirate; Respiratory  Result Value Ref Range Status   Specimen Description   Final    TRACHEAL ASPIRATE Performed at [facility], [address], [place], [place] [address]    Special Requests   Final    NONE Performed at [facility], [address] [place], [place] [address]    Gram Stain   Final    RARE WBC PRESENT,BOTH PMN AND MONONUCLEAR FEW GRAM NEGATIVE RODS    Culture   Final    MODERATE STENOTROPHOMONAS MALTOPHILIA FEW PSEUDOMONAS AERUGINOSA SUSCEPTIBILITIES PERFORMED ON PREVIOUS CULTURE WITHIN THE LAST 5 DAYS. Performed at [facility], [place] [address], [place], [place] [address]    Report Status 12/29/2021 FINAL  Final  Culture, blood (Routine X 2) w Reflex to ID Panel     Status: None (Preliminary result)   Collection Time: 12/27/21  8:18 PM   Specimen: BLOOD  Result Value Ref Range Status   Specimen Description BLOOD LAC  Final   Special Requests   Final    BOTTLES DRAWN AEROBIC AND ANAEROBIC Blood Culture adequate volume   Culture   Final    NO GROWTH 4 DAYS Performed at [facility], [address] [place], [place] [address]    Report Status PENDING  Incomplete  Culture, blood (Routine X 2) w Reflex to ID Panel     Status: None (Preliminary result)   Collection Time: 12/27/21  8:25 PM   Specimen: BLOOD  Result Value Ref Range Status   Specimen Description BLOOD [facility]  Final   Special Requests   Final    BOTTLES DRAWN AEROBIC AND ANAEROBIC Blood Culture adequate volume   Culture   Final    NO GROWTH 4 DAYS Performed at [facility], [address], [place], [place] [address]    Report Status PENDING  Incomplete  Culture, Respiratory w Gram Stain     Status: None (Preliminary result)   Collection Time: 12/30/21  2:18 PM   Specimen: Bronchoalveolar Lavage; Respiratory  Result Value Ref Range  Status   Specimen Description BRONCHIAL ALVEOLAR LAVAGE  Final   Special Requests L  Final   Gram Stain   Final    MODERATE WBC PRESENT, PREDOMINANTLY PMN NO ORGANISMS SEEN Performed at [facility], [address] [place], [place] [address]    Culture PENDING  Incomplete   Report Status PENDING  Incomplete  Culture, Respiratory w Gram Stain     Status: None (Preliminary result)   Collection Time: 12/30/21  2:18 PM   Specimen: Bronchoalveolar Lavage; Respiratory  Result Value Ref Range Status   Specimen Description BRONCHIAL ALVEOLAR LAVAGE  Final   Special Requests R  Final   Gram Stain   Final    NO WBC SEEN NO ORGANISMS SEEN Performed at [facility], [address], [place], [place] [address]    Culture PENDING  Incomplete   Report Status PENDING  Incomplete    Impression/Plan:  1. Pneumonia - will continue with multiple antibiotics due to multiple organisms isolated.  No changes today.   2.  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  Unclear how significant this is in one bottle but at this point, will continue due to critical illness.  3. Acute kidney injury - stable creat today and will continue to monitor.

## 2021-12-31 NOTE — Progress Notes (Signed)
Pts husband concerned about tube holder rubbing on sore on top lip, RT changed tube holder and placed it upside down on the chin. ETT secured.

## 2021-12-31 NOTE — Progress Notes (Signed)
  Patient self extubated around 1:30 PM, reassess the patient he was doing well on nasal cannula oxygen with O2 sat 100%, receiving hemodialysis.  Decision was to closely observe him on nasal cannula oxygen     Rilea Arutyunyan, MD     

## 2021-12-31 NOTE — Procedures (Signed)
Chest [name]:  There was no effusion on left side, completely consolidated left lung was noted.

## 2021-12-31 NOTE — Progress Notes (Addendum)
NAME:  Janet Hensley, MRN:  [id number], DOB:  [birth year], LOS: 3 ADMISSION DATE:  12/28/2021, CONSULTATION DATE:  12/31/2021  REFERRING MD:  Concha Se, CHIEF COMPLAINT: Severe pneumonia  History of Present Illness:  51 year old smoker presented to [facility] 11/26 with cough, shortness of breath nausea and vomiting, hypoxic to 89%, required intubation for mechanical ventilation. Chest x-ray showed complete whiteout of left lung.  Bronchoscopy showed purulent secretions on the left which were suctioned out. History of snorting crushed oxycodone, use of hot tub Blood cultures showed Rothia Mucilaginosa, urine tested positive for Legionella, bronchoscopy was positive for Pseudomonas and stenotrophomonas .  ID consult was obtained she was treated with cefepime and levofloxacin and nebulized tobramycin .  Course was complicated by AKI and bicarbonate drip was started She was transferred to [facility] for ARDS management and possible need for ECMO  Pertinent  Medical History    Significant Hospital Events: Including procedures, antibiotic start and stop dates in addition to other pertinent events   CTA chest -complete opacification of left lung, multiple pulmonary nodules, mediastinal and right hilar lymphadenopathy 12/2 transfer to [facility]  Interim History / Subjective:  Patient is afebrile Remains on nimbex with Versed and midazolam infusions FiO2 was titrated down to 70%, her P/F ratio is 108 Remained tachycardic  Objective   Blood pressure (!) 119/58, pulse (!) 116, temperature 99.2 F (37.3 C), temperature source Oral, resp. rate (!) 30, weight 90 kg, last menstrual period 01/16/2016, SpO2 97 %.    Vent Mode: PRVC FiO2 (%):  [80 %-90 %] 80 % Set Rate:  [30 bmp] 30 bmp Vt Set:  [420 mL] 420 mL PEEP:  [10 cmH20] 10 cmH20 Plateau Pressure:  [23 cmH20-27 cmH20] 27 cmH20   Intake/Output Summary (Last 24 hours) at 12/31/2021 9935 Last data filed at 12/31/2021 0700 Gross per 24 hour  Intake  1308.85 ml  Output 4030 ml  Net -2721.15 ml   Filed Weights   12/31/21 0500  Weight: 90 kg    Examination:   Physical exam: General: Crtitically ill-appearing female, lying on the bed orally intubated HEENT: [place]/AT, eyes anicteric.  ETT and OGT in place Neuro: Sedated, paralyzed.  Eyes are closed.  Pupils 3 mm bilateral reactive to light Chest: Coarse breath sounds on right side, reduced air entry in middle and lower left lung, no wheezes or rhonchi Heart: Tachycardic, regular rhythm, no murmurs or gallops Abdomen: Soft, nondistended, bowel sounds present Skin: Has vesicular rash around oral cavity   K+ 5.1 Na 151 BUN 157 Cr 2.59 Phos 6.6 WBC 27.7 H/H 9.8/30 Platelets 142  Resolved Hospital Problem list     Assessment & Plan:  Acute hypoxic/hypercapnic respiratory failure with ARDS in the setting of polymicrobial pneumonia with complete whiteout of left lung, mild RLL pneumonia-- possibly due to hot tub exposures Pseudomonas/stenotrophomonas pneumonia Legionella pneumonia Probable aspiration with history of snorting oxycodone COPD by history- on advair and albuterol PTA Continue lung protective ventilation, FiO2 was titrated down to 70% from 90% yesterday VAP prevention protocol PAD protocol for sedation with fentanyl and midazolam, currently paralyzed with Nimbex infusion Patient's P/F ratio is 108 consistent with moderate ARDS Repeat x-ray shows persistent left lung whiteout Peak, plateau and driving pressures are at goal Infectious diseases following, recommend continuing ampicillin, Levaquin, minocycline, inhaled tobramycin Continue Steroids, bronchodilators Bronchoscopy was performed which showed copious amount of tenacious secretions bilaterally, suctioned but without much improvement in x-ray chest Follow-up BAL culture  Severe sepsis with AKI and respiratory failure  due to bacteremia - Rothia mucilaginosa and polymicrobial bacteremia, POA Per history from  significant other patient snorts oxycodone Continue ampicillin per discussion with infectious disease  AKI due to septic ATN with hypervolemia Hyperkalemia, improved Hypernatremia Hyperphosphatemia Patient serum creatinine improved, down from 2.8-2.6, urine output is increasing Serum potassium has trended down to 5.1 Serum sodium continue to trend up, increase free water to 200 every 3 hours Avoid nephrotoxic agent Monitor intake and output  Perioral herpes Continue valacyclovir   Chronic pain on chronic opiates PAD protocol Will resume orals when extubated  Prediabetes Obesity Blood sugars are better controlled Patient hemoglobin A1c is 5.8 Continue sliding scale insulin with CBG goal 140-180 Continue tube feeds  Initial ST elevations on EKG, no troponin elevation or no wall motion abnormalities on echo, 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 Repeat EKG this morning shows sinus tachycardia with nonspecific ST abnormalities  Anemia of chronic disease thrombocytopenia  likely 2/2 sepsis H&H remained stable around 9 and platelet counts are trending up, currently at 142 Closely monitor CBC  Best Practice (right click and "Reselect all SmartList Selections" daily)   Diet/type: tubefeeds DVT prophylaxis: prophylactic heparin  GI prophylaxis: PPI Lines: Central line Foley:  Yes, and it is still needed Code Status:  full code Last date of multidisciplinary goals of care discussion [12/3: s/o updated at bedside, full scope of care]  Labs   CBC: Recent Labs  Lab 12/28/21 0412 12/28/21 1743 12/28/21 1754 12/29/21 0327 12/29/21 0334 12/30/21 0323 12/31/21 0430  WBC 23.4*  --  21.8* 24.6*  --  25.7* 27.7*  NEUTROABS  --   --  20.7*  --   --   --  26.3*  HGB 8.8*   < > 9.6* 9.2* 9.2* 9.7* 9.8*  HCT 27.8*   < > 30.8* 30.0* 27.0* 30.4* 29.6*  MCV 101.1*  --  102.3* 102.4*  --  98.7 97.7  PLT 90*  --  99* 99*  --  127* 142*   < > = values in this interval not displayed.    Basic  Metabolic Panel: Recent Labs  Lab 12/26/21 0307 12/27/21 0433 12/27/21 1601 12/28/21 1754 12/28/21 2205 12/29/21 0327 12/29/21 0334 12/29/21 1529 12/30/21 0323 12/31/21 0430  NA 145 148*   < > 145 145 145 143 144 147* 151*  K 3.9 4.8   < > 6.1* 6.3* 6.1* 5.9* 5.7* 5.3* 5.1  CL 118* 119*   < > 114* 114* 111  --  111 109 115*  CO2 24 23   < > _0 --  _1 GLUCOSE 131* 173*   < > 166* 149* 126*  --  175* 172* 156*  BUN 53* 80*   < > 106* 114* 122*  --  140* 157* 158*  CREATININE 0.71 1.15*   < > 2.29* 2.43* 2.45*  --  2.64* 2.83* 2.59*  CALCIUM 8.8* 9.0   < > 8.2* 8.1* 8.2*  --  8.0* 8.3* 8.7*  MG 2.6* 2.8*  --  2.4  --  2.6*  --   --  2.5*  --   PHOS 4.0 5.6*  --  5.9*  --  6.5*  --   --  6.6*  --    < > = values in this interval not displayed.   GFR: Estimated Creatinine Clearance: 27.3 mL/min (A) (by C-G formula based on SCr of 2.59 mg/dL (H)). Recent Labs  Lab 12/25/21 0349 12/26/21 0307 12/27/21 0433 12/27/21 1647 12/27/21  1930 12/28/21 0412 12/28/21 1754 12/29/21 0327 12/30/21 0323 12/31/21 0430  PROCALCITON 13.40  --  5.19  --   --  3.67  --   --   --   --   WBC 12.5*   < > 16.9*  --   --  23.4* 21.8* 24.6* 25.7* 27.7*  LATICACIDVEN  --   --   --  1.1 1.5  --   --   --   --   --    < > = values in this interval not displayed.    Critical care time:    This patient is critically ill with multiple organ system failure which requires frequent high complexity decision making, assessment, support, evaluation, and titration of therapies. This was completed through the application of advanced monitoring technologies and extensive interpretation of multiple databases.  During this encounter critical care time was devoted to patient care services described in this note for 39 minutes.    Jacky Kindle, MD [facility] See Amion for pager If no response to pager, please call [phone] until 7pm After 7pm, Please call E-link [phone]

## 2022-01-01 ENCOUNTER — Inpatient Hospital Stay (HOSPITAL_COMMUNITY): Payer: 59

## 2022-01-01 DIAGNOSIS — N179 Acute kidney failure, unspecified: Secondary | ICD-10-CM | POA: Diagnosis not present

## 2022-01-01 DIAGNOSIS — J80 Acute respiratory distress syndrome: Secondary | ICD-10-CM | POA: Diagnosis not present

## 2022-01-01 LAB — MISC LABCORP TEST (SEND OUT)
LabCorp test name: 3
Labcorp test code: 88013

## 2022-01-01 LAB — COMPREHENSIVE METABOLIC PANEL
ALT: 61 U/L — ABNORMAL HIGH (ref 0–44)
AST: 46 U/L — ABNORMAL HIGH (ref 15–41)
Albumin: <1.5 g/dL — ABNORMAL LOW (ref 3.5–5.0)
Alkaline Phosphatase: 89 U/L (ref 38–126)
Anion gap: 10 (ref 5–15)
BUN: 154 mg/dL — ABNORMAL HIGH (ref 6–20)
CO2: 26 mmol/L (ref 22–32)
Calcium: 9.6 mg/dL (ref 8.9–10.3)
Chloride: 118 mmol/L — ABNORMAL HIGH (ref 98–111)
Creatinine, Ser: 2.12 mg/dL — ABNORMAL HIGH (ref 0.44–1.00)
GFR, Estimated: 28 mL/min — ABNORMAL LOW (ref >60)
Glucose, Bld: 162 mg/dL — ABNORMAL HIGH (ref 70–99)
Potassium: 4.6 mmol/L (ref 3.5–5.1)
Sodium: 154 mmol/L — ABNORMAL HIGH (ref 135–145)
Total Bilirubin: 0.7 mg/dL (ref 0.3–1.2)
Total Protein: 5.6 g/dL — ABNORMAL LOW (ref 6.5–8.1)

## 2022-01-01 LAB — CBC WITH DIFFERENTIAL/PLATELET
Abs Immature Granulocytes: 1.86 10*3/uL — ABNORMAL HIGH (ref 0.00–0.07)
Basophils Absolute: 0.1 10*3/uL (ref 0.0–0.1)
Basophils Relative: 0 %
Eosinophils Absolute: 0 10*3/uL (ref 0.0–0.5)
Eosinophils Relative: 0 %
HCT: 30.6 % — ABNORMAL LOW (ref 36.0–46.0)
Hemoglobin: 9.8 g/dL — ABNORMAL LOW (ref 12.0–15.0)
Immature Granulocytes: 6 %
Lymphocytes Relative: 6 %
Lymphs Abs: 1.7 10*3/uL (ref 0.7–4.0)
MCH: 32 pg (ref 26.0–34.0)
MCHC: 32 g/dL (ref 30.0–36.0)
MCV: 100 fL (ref 80.0–100.0)
Monocytes Absolute: 1 10*3/uL (ref 0.1–1.0)
Monocytes Relative: 3 %
Neutro Abs: 25.8 10*3/uL — ABNORMAL HIGH (ref 1.7–7.7)
Neutrophils Relative %: 85 %
Platelets: 161 10*3/uL (ref 150–400)
RBC: 3.06 MIL/uL — ABNORMAL LOW (ref 3.87–5.11)
RDW: 14.9 % (ref 11.5–15.5)
WBC: 30.5 10*3/uL — ABNORMAL HIGH (ref 4.0–10.5)
nRBC: 0 % (ref 0.0–0.2)

## 2022-01-01 LAB — CULTURE, RESPIRATORY W GRAM STAIN: Culture: NORMAL

## 2022-01-01 LAB — POCT I-STAT 7, (LYTES, BLD GAS, ICA,H+H)
Acid-Base Excess: 1 mmol/L (ref 0.0–2.0)
Bicarbonate: 26.1 mmol/L (ref 20.0–28.0)
Calcium, Ion: 1.38 mmol/L (ref 1.15–1.40)
HCT: 27 % — ABNORMAL LOW (ref 36.0–46.0)
Hemoglobin: 9.2 g/dL — ABNORMAL LOW (ref 12.0–15.0)
O2 Saturation: 96 %
Patient temperature: 98.5
Potassium: 4.6 mmol/L (ref 3.5–5.1)
Sodium: 154 mmol/L — ABNORMAL HIGH (ref 135–145)
TCO2: 27 mmol/L (ref 22–32)
pCO2 arterial: 41 mmHg (ref 32–48)
pH, Arterial: 7.412 (ref 7.35–7.45)
pO2, Arterial: 79 mmHg — ABNORMAL LOW (ref 83–108)

## 2022-01-01 LAB — CULTURE, BLOOD (ROUTINE X 2)
Culture: NO GROWTH
Culture: NO GROWTH
Special Requests: ADEQUATE
Special Requests: ADEQUATE

## 2022-01-01 LAB — GLUCOSE, CAPILLARY
Glucose-Capillary: 161 mg/dL — ABNORMAL HIGH (ref 70–99)
Glucose-Capillary: 129 mg/dL — ABNORMAL HIGH (ref 70–99)
Glucose-Capillary: 151 mg/dL — ABNORMAL HIGH (ref 70–99)
Glucose-Capillary: 174 mg/dL — ABNORMAL HIGH (ref 70–99)
Glucose-Capillary: 147 mg/dL — ABNORMAL HIGH (ref 70–99)

## 2022-01-01 MED ORDER — FREE WATER
200.0000 mL | Status: DC
  Administered 2022-01-01 – 2022-01-04 (×26): 200 mL

## 2022-01-01 MED ORDER — SODIUM CHLORIDE 0.9 % IV SOLN
2.0000 g | Freq: Four times a day (QID) | INTRAVENOUS | Status: DC
  Administered 2022-01-01 – 2022-01-02 (×4): 2 g via INTRAVENOUS
  Filled 2022-01-01 (×5): qty 2000

## 2022-01-01 MED ORDER — DEXTROSE IN LACTATED RINGERS 5 % IV SOLN: INTRAVENOUS | Status: DC

## 2022-01-01 NOTE — Progress Notes (Signed)
PHARMACY NOTE:  ANTIMICROBIAL RENAL DOSAGE ADJUSTMENT  Current antimicrobial regimen includes a mismatch between antimicrobial dosage and estimated renal function.  As per policy approved by the Pharmacy & Therapeutics and Medical Executive Committees, the antimicrobial dosage will be adjusted accordingly.  Current antimicrobial dosage:  Ampicillin 2g IV every 8 hours  Indication: Rothia mucilagniosa bacteremia  Renal Function:  Estimated Creatinine Clearance: 33.2 mL/min (A) (by C-G formula based on SCr of 2.12 mg/dL (H)). []      On intermittent HD, scheduled: []      On CRRT    Antimicrobial dosage has been changed to:  Ampicillin 2g IV every 6 hours  Additional comments:   Thank you for allowing pharmacy to be a part of this patient's care.  , PharmD, BCPS Infectious Diseases Clinical Pharmacist 01/01/2022 1:10 PM   **Pharmacist phone directory can now be found on [email or web address] ([email or web address]).  Listed under [facility].

## 2022-01-01 NOTE — Progress Notes (Signed)
[facility] for Infectious Disease   Reason for visit: follow up on pneumonia  Interval History:  had bronchoscopy yesterday with very difficult to suction secretions. FiO2 down to 60%.    Husband at bedside Day 11 total antibiotics Day 6 levaquin Day 6 inhaled tobi Day 7 valacyclovir Day 6 minocycline Day 2 ampicillin  Physical Exam: Constitutional:  Vitals:   01/01/22 1200 01/01/22 1307  BP:    Pulse:    Resp:    Temp: 98.3 F (36.8 C)   SpO2:  92%  She is in nad Eyes: anicteric HENT: +ET Respiratory: respiratoryeffort on vent  Review of Systems: Unable to assess due to patient factors  Lab Results  Component Value Date   WBC 30.5 (H) 01/01/2022   HGB 9.8 (L) 01/01/2022   HCT 30.6 (L) 01/01/2022   MCV 100.0 01/01/2022   PLT 161 01/01/2022    Lab Results  Component Value Date   CREATININE 2.12 (H) 01/01/2022   BUN 154 (H) 01/01/2022   NA 154 (H) 01/01/2022   K 4.6 01/01/2022   CL 118 (H) 01/01/2022   CO2 26 01/01/2022    Lab Results  Component Value Date   ALT 61 (H) 01/01/2022   AST 46 (H) 01/01/2022   ALKPHOS 89 01/01/2022     Microbiology: Recent Results (from the past 240 hour(s))  Blood Culture (routine x 2)     Status: Abnormal   Collection Time: 12/22/21  6:36 PM   Specimen: BLOOD  Result Value Ref Range Status   Specimen Description   Final    BLOOD RIGHT ANTECUBITAL Performed at [facility], [place] [address] [place], [place] [address]    Special Requests   Final    BOTTLES DRAWN AEROBIC AND ANAEROBIC Blood Culture adequate volume Performed at [facility], [address] [place], [place] [address]    Culture  Setup Time   Final    GRAM POSITIVE COCCI AEROBIC BOTTLE ONLY CRITICAL RESULT CALLED TO, READ BACK BY AND VERIFIED WITH: DEVON MITCHELLE 12/23/21 1438 MW    Culture (A)  Final    ROTHIA MUCILAGINOSA Standardized susceptibility testing for this organism is not available. Performed at [facility], [place] [address], [place], [place] [address]    Report Status 12/24/2021 FINAL  Final  Blood Culture ID Panel (Reflexed)     Status: None   Collection Time: 12/22/21  6:36 PM  Result Value Ref Range Status   Enterococcus faecalis NOT DETECTED NOT DETECTED Final   Enterococcus Faecium NOT DETECTED NOT DETECTED Final   Listeria monocytogenes NOT DETECTED NOT DETECTED Final   Staphylococcus species NOT DETECTED NOT DETECTED Final   Staphylococcus aureus (BCID) NOT DETECTED NOT DETECTED Final   Staphylococcus epidermidis NOT DETECTED NOT DETECTED Final   Staphylococcus lugdunensis NOT DETECTED NOT DETECTED Final   Streptococcus species NOT DETECTED NOT DETECTED Final   Streptococcus agalactiae NOT DETECTED NOT DETECTED Final   Streptococcus pneumoniae NOT DETECTED NOT DETECTED Final   Streptococcus pyogenes NOT DETECTED NOT DETECTED Final   A.calcoaceticus-baumannii NOT DETECTED NOT DETECTED Final   Bacteroides fragilis NOT DETECTED NOT DETECTED Final   Enterobacterales NOT DETECTED NOT DETECTED Final   Enterobacter cloacae complex NOT DETECTED NOT DETECTED Final   Escherichia coli NOT DETECTED NOT DETECTED Final   Klebsiella aerogenes NOT DETECTED NOT DETECTED Final   Klebsiella oxytoca NOT DETECTED NOT DETECTED Final   Klebsiella pneumoniae NOT DETECTED NOT DETECTED Final   Proteus  species NOT DETECTED NOT DETECTED Final   Salmonella species NOT DETECTED NOT DETECTED Final   Serratia marcescens NOT DETECTED NOT DETECTED Final   Haemophilus influenzae NOT DETECTED NOT DETECTED Final   Neisseria meningitidis NOT DETECTED NOT DETECTED Final   Pseudomonas aeruginosa NOT DETECTED NOT DETECTED Final   Stenotrophomonas maltophilia NOT DETECTED NOT DETECTED Final   Candida albicans NOT DETECTED NOT DETECTED Final   Candida auris NOT DETECTED NOT DETECTED Final   Candida glabrata NOT DETECTED NOT DETECTED Final   Candida krusei NOT DETECTED NOT DETECTED Final   Candida parapsilosis  NOT DETECTED NOT DETECTED Final   Candida tropicalis NOT DETECTED NOT DETECTED Final   Cryptococcus neoformans/gattii NOT DETECTED NOT DETECTED Final    Comment: Performed at [facility], [place], [place] [address]  SARS Coronavirus 2 by RT PCR (hospital order, performed in [facility] hospital lab) *cepheid single result test* Anterior Nasal Swab     Status: None   Collection Time: 12/22/21  6:50 PM   Specimen: Anterior Nasal Swab  Result Value Ref Range Status   SARS Coronavirus 2 by RT PCR NEGATIVE NEGATIVE Final    Comment: (NOTE) SARS-CoV-2 target nucleic acids are NOT DETECTED.  The SARS-CoV-2 RNA is generally detectable in upper and lower respiratory specimens during the acute phase of infection. The lowest concentration of SARS-CoV-2 viral copies this assay can detect is 250 copies / mL. A negative result does not preclude SARS-CoV-2 infection and should not be used as the sole basis for treatment or other patient management decisions.  A negative result may occur with improper specimen collection / handling, submission of specimen other than nasopharyngeal swab, presence of viral mutation(s) within the areas targeted by this assay, and inadequate number of viral copies (<250 copies / mL). A negative result must be combined with clinical observations, patient history, and epidemiological information.  Fact Sheet for Patients:   https://www.patel.info/  Fact Sheet for Healthcare Providers: https://hall.com/  This test is not yet approved or  cleared by the [place] FDA and has been authorized for detection and/or diagnosis of SARS-CoV-2 by FDA under an Emergency Use Authorization (EUA).  This EUA will remain in effect (meaning this test can be used) for the duration of the COVID-19 declaration under Section 564(b)(1) of the Act, 21 U.S.C. section 360bbb-3(b)(1), unless the authorization is terminated  or revoked sooner.  Performed at [facility], [address] [place], [place] [address]   MRSA Next Gen by PCR, Nasal     Status: None   Collection Time: 12/22/21 10:55 PM   Specimen: Nasal Mucosa; Nasal Swab  Result Value Ref Range Status   MRSA by PCR Next Gen NOT DETECTED NOT DETECTED Final    Comment: (NOTE) The GeneXpert MRSA Assay (FDA approved for NASAL specimens only), is one component of a comprehensive MRSA colonization surveillance program. It is not intended to diagnose MRSA infection nor to guide or monitor treatment for MRSA infections. Test performance is not FDA approved in patients less than 78 years old. Performed at [facility], [address] [place], [place] [address]   Blood Culture (routine x 2)     Status: None   Collection Time: 12/22/21 11:15 PM   Specimen: BLOOD LEFT HAND  Result Value Ref Range Status   Specimen Description BLOOD LEFT HAND  Final   Special Requests IN PEDIATRIC BOTTLE Blood Culture adequate volume  Final   Culture   Final  NO GROWTH 5 DAYS Performed at [facility], [address] [place], [place] [address]    Report Status 12/27/2021 FINAL  Final  Respiratory (~20 pathogens) panel by PCR     Status: None   Collection Time: 12/23/21 12:01 AM  Result Value Ref Range Status   Adenovirus NOT DETECTED NOT DETECTED Final   Coronavirus 229E NOT DETECTED NOT DETECTED Final    Comment: (NOTE) The Coronavirus on the Respiratory Panel, DOES NOT test for the novel  Coronavirus (2019 nCoV)    Coronavirus HKU1 NOT DETECTED NOT DETECTED Final   Coronavirus NL63 NOT DETECTED NOT DETECTED Final   Coronavirus OC43 NOT DETECTED NOT DETECTED Final   Metapneumovirus NOT DETECTED NOT DETECTED Final   Rhinovirus / Enterovirus NOT DETECTED NOT DETECTED Final   Influenza A NOT DETECTED NOT DETECTED Final   Influenza B NOT DETECTED NOT DETECTED Final   Parainfluenza Virus 1 NOT DETECTED NOT DETECTED Final    Parainfluenza Virus 2 NOT DETECTED NOT DETECTED Final   Parainfluenza Virus 3 NOT DETECTED NOT DETECTED Final   Parainfluenza Virus 4 NOT DETECTED NOT DETECTED Final   Respiratory Syncytial Virus NOT DETECTED NOT DETECTED Final   Bordetella pertussis NOT DETECTED NOT DETECTED Final   Bordetella Parapertussis NOT DETECTED NOT DETECTED Final   Chlamydophila pneumoniae NOT DETECTED NOT DETECTED Final   Mycoplasma pneumoniae NOT DETECTED NOT DETECTED Final    Comment: Performed at [facility], [place] [place], [place] [address]  Urine Culture     Status: None   Collection Time: 12/23/21  4:11 AM   Specimen: In/Out Cath Urine  Result Value Ref Range Status   Specimen Description   Final    IN/OUT CATH URINE Performed at [facility], [address], [place], [place] [address]    Special Requests   Final    NONE Performed at [facility], [address], [place], [place] [address]    Culture   Final    NO GROWTH Performed at [facility], [place] [address] [place], [place] [address]    Report Status 12/24/2021 FINAL  Final  Culture, Respiratory w Gram Stain     Status: None   Collection Time: 12/23/21  1:32 PM   Specimen: Tracheal Aspirate; Respiratory  Result Value Ref Range Status   Specimen Description   Final    TRACHEAL ASPIRATE Performed at [facility], [address] [place], [place] [address]    Special Requests   Final    NONE Performed at [facility], [address] [place],  [address]    Gram Stain   Final    NO WBC SEEN NO ORGANISMS SEEN Performed at [facility], [place] [address], [place],  [address]    Culture   Final    RARE PSEUDOMONAS AERUGINOSA RARE STENOTROPHOMONAS MALTOPHILIA    Report Status 12/31/2021 FINAL  Final   Organism ID, Bacteria PSEUDOMONAS AERUGINOSA  Final   Organism ID, Bacteria STENOTROPHOMONAS MALTOPHILIA  Final      Susceptibility   Pseudomonas aeruginosa  - MIC*    CEFTAZIDIME 4 SENSITIVE Sensitive     CIPROFLOXACIN <=0.25 SENSITIVE Sensitive     GENTAMICIN <=1 SENSITIVE Sensitive     IMIPENEM 1 SENSITIVE Sensitive     PIP/TAZO 8 SENSITIVE Sensitive     CEFEPIME 2 SENSITIVE Sensitive     LEVOFLOXACIN Value in next row Sensitive      SENSITIVE1.0    *  RARE PSEUDOMONAS AERUGINOSA   Stenotrophomonas maltophilia - MIC*    LEVOFLOXACIN Value in next row Sensitive      SENSITIVE1.0    TRIMETH/SULFA Value in next row Sensitive      SENSITIVE1.0    * RARE STENOTROPHOMONAS MALTOPHILIA  Culture, BAL-quantitative w Gram Stain     Status: Abnormal (Preliminary result)   Collection Time: 12/25/21 11:46 AM   Specimen: Bronchoalveolar Lavage; Respiratory  Result Value Ref Range Status   Specimen Description   Final    BRONCHIAL ALVEOLAR LAVAGE Performed at [facility], [address], [place], [place] [address]    Special Requests   Final    Normal Performed at [facility], [address] [place], [place] [address]    Gram Stain   Final    NO WBC SEEN MODERATE GRAM NEGATIVE RODS Performed at [facility], [place] [address], [place], [place] [address]    Culture (A)  Final    >=100,000 COLONIES/mL PSEUDOMONAS AERUGINOSA 80,000 COLONIES/mL STENOTROPHOMONAS MALTOPHILIA Sent to [facility] for further susceptibility testing. PSEUDOMONAS AERUGINOSA    Report Status PENDING  Incomplete   Organism ID, Bacteria PSEUDOMONAS AERUGINOSA (A)  Final   Organism ID, Bacteria STENOTROPHOMONAS MALTOPHILIA (A)  Final      Susceptibility   Pseudomonas aeruginosa - MIC*    CEFTAZIDIME 4 SENSITIVE Sensitive     CIPROFLOXACIN <=0.25 SENSITIVE Sensitive     GENTAMICIN 2 SENSITIVE Sensitive     IMIPENEM 1 SENSITIVE Sensitive     PIP/TAZO 8 SENSITIVE Sensitive     CEFEPIME 2 SENSITIVE Sensitive     * >=100,000 COLONIES/mL PSEUDOMONAS AERUGINOSA   Stenotrophomonas maltophilia - MIC*    LEVOFLOXACIN 0.25 SENSITIVE Sensitive      TRIMETH/SULFA <=20 SENSITIVE Sensitive     * 80,000 COLONIES/mL STENOTROPHOMONAS MALTOPHILIA  Legionella Pneumophila/Culture     Status: None (Preliminary result)   Collection Time: 12/25/21 11:46 AM   Specimen: Bronchial Alveolar Lavage; Respiratory  Result Value Ref Range Status   Legionella Pneumophila DFA Negative Negative Final    Comment: (NOTE) Performed At: [facility] [place], [place] [id number] Rush Farmer MD IT:[id number]    Legionella Species Culture PENDING  Incomplete   Source, Legionella Cul BRONCHIAL ALVEOLAR LAVAGE  Final    Comment: Performed at [facility], [place], [place] [address]  Acid Fast Smear (AFB)     Status: None   Collection Time: 12/27/21  4:01 PM   Specimen: Tracheal Aspirate; Sputum  Result Value Ref Range Status   AFB Specimen Processing WRORD  Final    Comment: (NOTE) Test not performed. The required specimen for the test ordered was not received. Received:Serum Requires:Sputum, bronchial washing Antonieta Iba was notified 12/31/2021    Acid Fast Smear NOT PERFORMED  Final    Comment: (NOTE) Test not performed Performed At: [facility] [place], [place] [id number] Rush Farmer MD SR:[id number]    Source (AFB) BLOOD  Final    Comment: Performed at [facility], [address] [place],  [address]  Acid Fast Culture with reflexed sensitivities     Status: None   Collection Time: 12/27/21  4:01 PM   Specimen: Tracheal Aspirate; Sputum  Result Value Ref Range Status   Acid Fast Culture WRORD  Final    Comment: (NOTE) Test not performed. The required specimen for the test ordered was not received. Received:Serum Requires:Sputum, bronchial washing Antonieta Iba was notified 12/31/2021 Performed At:  [facility] [place], [place] [id number] Rush Farmer MD OA:[id number]    Source of Sample BLOOD  Final    Comment: Performed  at [facility], [address] [place], [place] [address]  Culture, Respiratory w Gram Stain     Status: None   Collection Time: 12/27/21  4:01 PM   Specimen: Tracheal Aspirate; Respiratory  Result Value Ref Range Status   Specimen Description   Final    TRACHEAL ASPIRATE Performed at [facility], [address], [place], [place] [address]    Special Requests   Final    NONE Performed at [facility], [address] [place], [place] [address]    Gram Stain   Final    RARE WBC PRESENT,BOTH PMN AND MONONUCLEAR FEW GRAM NEGATIVE RODS    Culture   Final    MODERATE STENOTROPHOMONAS MALTOPHILIA FEW PSEUDOMONAS AERUGINOSA SUSCEPTIBILITIES PERFORMED ON PREVIOUS CULTURE WITHIN THE LAST 5 DAYS. Performed at [facility], [place] [address], [place], [place] [address]    Report Status 12/29/2021 FINAL  Final  Culture, blood (Routine X 2) w Reflex to ID Panel     Status: None   Collection Time: 12/27/21  8:18 PM   Specimen: BLOOD  Result Value Ref Range Status   Specimen Description BLOOD LAC  Final   Special Requests   Final    BOTTLES DRAWN AEROBIC AND ANAEROBIC Blood Culture adequate volume   Culture   Final    NO GROWTH 5 DAYS Performed at [facility], [address] [place], [place] [address]    Report Status 01/01/2022 FINAL  Final  Culture, blood (Routine X 2) w Reflex to ID Panel     Status: None   Collection Time: 12/27/21  8:25 PM   Specimen: BLOOD  Result Value Ref Range Status   Specimen Description BLOOD [facility]  Final   Special Requests   Final    BOTTLES DRAWN AEROBIC AND ANAEROBIC Blood Culture adequate volume   Culture   Final    NO GROWTH 5 DAYS Performed at [facility], [address] [place], [place] [address]    Report Status 01/01/2022 FINAL  Final  Acid Fast Smear (AFB)     Status: None   Collection Time: 12/30/21 11:02 AM  Result Value Ref Range Status   AFB Specimen Processing Concentration  Final    Acid Fast Smear Negative  Final    Comment: (NOTE) Performed At: [facility] [place], [place] [id number] Rush Farmer MD TG:[id number]    Source (AFB) EXPECTORATED SPUTUM  Final    Comment: Performed at [facility], [address] [place], [place] [address]  Culture, Respiratory w Gram Stain     Status: None   Collection Time: 12/30/21  2:18 PM   Specimen: Bronchoalveolar Lavage; Respiratory  Result Value Ref Range Status   Specimen Description BRONCHIAL ALVEOLAR LAVAGE  Final   Special Requests L  Final   Gram Stain   Final    MODERATE WBC PRESENT, PREDOMINANTLY PMN NO ORGANISMS SEEN    Culture   Final    RARE Normal respiratory flora-no Staph aureus or Pseudomonas seen Performed at [facility], [address] [place], [place] [address]    Report Status 01/01/2022 FINAL  Final  Culture, Respiratory w Gram Stain     Status: None (Preliminary result)   Collection Time: 12/30/21  2:18 PM   Specimen: Bronchoalveolar Lavage;  Respiratory  Result Value Ref Range Status   Specimen Description BRONCHIAL ALVEOLAR LAVAGE  Final   Special Requests R  Final   Gram Stain NO WBC SEEN NO ORGANISMS SEEN   Final   Culture   Final    CULTURE REINCUBATED FOR BETTER GROWTH Performed at [facility], [address], [place], [place] [address]    Report Status PENDING  Incomplete    Impression/Plan:  1. Pneumonia - some very minimal improvement in the FiO2 and no new findings.  Has been on prolonged antibiotics.   At this point, will stop the inhaled tobi.    2. Respiratory failure - plan for tracheostomy noted.  Will continue to monitor.   3.  Aki - creat stable today and will continue to monitor.

## 2022-01-01 NOTE — Progress Notes (Signed)
Transported from 2H26 to CT and back without any complications.

## 2022-01-01 NOTE — Progress Notes (Signed)
NAME:  Janet Hensley, MRN:  [id number], DOB:  [birth year], LOS: 4 ADMISSION DATE:  12/28/2021, CONSULTATION DATE:  01/01/2022  REFERRING MD:  Concha Se, CHIEF COMPLAINT: Severe pneumonia  History of Present Illness:  51 year old smoker presented to [facility] 11/26 with cough, shortness of breath nausea and vomiting, hypoxic to 89%, required intubation for mechanical ventilation. Chest x-ray showed complete whiteout of left lung.  Bronchoscopy showed purulent secretions on the left which were suctioned out. History of snorting crushed oxycodone, use of hot tub Blood cultures showed Rothia Mucilaginosa, urine tested positive for Legionella, bronchoscopy was positive for Pseudomonas and stenotrophomonas .  ID consult was obtained she was treated with cefepime and levofloxacin and nebulized tobramycin .  Course was complicated by AKI and bicarbonate drip was started She was transferred to [facility] for ARDS management and possible need for ECMO  Pertinent  Medical History    Significant Hospital Events: Including procedures, antibiotic start and stop dates in addition to other pertinent events   CTA chest -complete opacification of left lung, multiple pulmonary nodules, mediastinal and right hilar lymphadenopathy 12/2 transfer to [facility]  Interim History / Subjective:  FiO2 was titrated down to 60% Repeat chest CT was done this morning which showing improvement from prior CT angiogram done on 1126 Remain afebrile Deeply sedated  Objective   Blood pressure 119/63, pulse (!) 132, temperature 98.5 F (36.9 C), temperature source Oral, resp. rate (!) 30, height _0  (1.6 m), weight 88.9 kg, last menstrual period 01/16/2016, SpO2 95 %.    Vent Mode: PRVC FiO2 (%):  [70 %] 70 % Set Rate:  [30 bmp] 30 bmp Vt Set:  [420 mL] 420 mL PEEP:  [10 cmH20] 10 cmH20 Plateau Pressure:  [24 cmH20-26 cmH20] 26 cmH20   Intake/Output Summary (Last 24 hours) at 01/01/2022 0809 Last data filed at 01/01/2022  0743 Gross per 24 hour  Intake 1938.21 ml  Output 3375 ml  Net -1436.79 ml   Filed Weights   12/31/21 0500 12/31/21 0800 01/01/22 0400  Weight: 90 kg 90 kg 88.9 kg    Examination:   Physical exam: General: Crtitically ill-appearing female, lying on the bed orally intubated HEENT: [place]/AT, eyes anicteric.  ETT and OGT in place Neuro: Sedated, paralyzed.  Eyes are closed.  Pupils 3 mm bilateral reactive to light Chest: Crackles heard on left side, clear to auscultation on the right, no wheezes Heart: Tachycardic, regular rhythm, no murmurs or gallops Abdomen: Soft, nondistended, bowel sounds present Skin: Has vesicular rash with ulceration around oral cavity   K+ 4.6 Na 154 BUN 154 Cr 2.12 WBC 30.5 H/H 9.8/30.6 Platelets 161  Resolved Hospital Problem list   Critical illness thrombocytopenia  Assessment & Plan:  Acute hypoxic/hypercapnic respiratory failure with ARDS in the setting of polymicrobial pneumonia with complete whiteout of left lung, mild RLL pneumonia-- possibly due to hot tub exposures Pseudomonas/stenotrophomonas pneumonia Legionella pneumonia Probable aspiration with history of snorting oxycodone COPD, not in exacerbation Continue lung protective ventilation, FiO2 was titrated down to 60%, her P/F ratio remained at 112 about same as yesterday, consistent with moderate ARDS VAP prevention protocol PAD protocol for sedation with fentanyl and midazolam Will stop Nimbex Chest CT was repeated this morning, showing improvement in lung aeration compared to CT chest on 11/26 Peak, plateau and driving pressures are at goal Continue ampicillin, Levaquin, minocycline, inhaled tobramycin per discussion with infectious disease Continue Steroids, bronchodilators, will taper off his steroid tomorrow BAL culture showed no growth  Considering  patient is already on ventilator for 10 days and chest CT still showing not much improvement with P/F ratio of 112, I think patient  will require tracheostomy.  Patient's husband was informed, he would like to think about it and needs some time  Severe sepsis with AKI and respiratory failure due to bacteremia - Rothia mucilaginosa and polymicrobial bacteremia, POA Per history from significant other patient snorts oxycodone Continue ampicillin per discussion with infectious disease  AKI due to septic ATN with hypervolemia Hyperkalemia, improved Hypernatremia Hyperphosphatemia Patient serum creatinine continue to improve now at 2.1, she made 3.4 L of urine in last 24 hours, I think she is in diuretic phase of ATN We will start gentle fluid hydration with D5 LR Serum potassium has trended down to 4.6 Serum sodium continue to trend up, currently at 154, increase free water to 200 every 2 hours Hopefully D5 LR will help decrease serum sodium Avoid nephrotoxic agent Monitor intake and output  Perioral herpes Continue valacyclovir   Chronic pain on chronic opiates PAD protocol Will resume orals when extubated  Prediabetes Obesity Blood sugars remain at goal of 140-180 Continue sliding scale insulin Continue tube feeds  Nonobstructive coronary artery disease  Acute ST elevation MI was ruled out with no wall motion abnormalities on echo and LHC showed nonobstructive coronary artery disease   Anemia of chronic disease thrombocytopenia  likely 2/2 sepsis H&H remained stable around 9 and platelet counts are trending up, currently at 161 Closely monitor CBC  Best Practice (right click and "Reselect all SmartList Selections" daily)   Diet/type: tubefeeds DVT prophylaxis: prophylactic heparin  GI prophylaxis: PPI Lines: Central line Foley:  Yes, and it is still needed Code Status:  full code Last date of multidisciplinary goals of care discussion [12/5: s/o updated at bedside, full scope of care, discussed regarding tracheostomy, will circle back as he needs some time to think about it  Labs   CBC: Recent Labs   Lab 12/28/21 1754 12/29/21 0327 12/29/21 0334 12/30/21 0323 12/31/21 0430 12/31/21 0816 01/01/22 0347 01/01/22 0348  WBC 21.8* 24.6*  --  25.7* 27.7*  --   --  30.5*  NEUTROABS 20.7*  --   --   --  26.3*  --   --  25.8*  HGB 9.6* 9.2*   < > 9.7* 9.8* 9.2* 9.2* 9.8*  HCT 30.8* 30.0*   < > 30.4* 29.6* 27.0* 27.0* 30.6*  MCV 102.3* 102.4*  --  98.7 97.7  --   --  100.0  PLT 99* 99*  --  127* 142*  --   --  161   < > = values in this interval not displayed.    Basic Metabolic Panel: Recent Labs  Lab 12/26/21 0307 12/27/21 0433 12/27/21 1601 12/28/21 1754 12/28/21 2205 12/29/21 0327 12/29/21 0334 12/29/21 1529 12/30/21 0323 12/31/21 0430 12/31/21 0816 01/01/22 0347 01/01/22 0348  NA 145 148*   < > 145   < > 145   < > 144 147* 151* 151* 154* 154*  K 3.9 4.8   < > 6.1*   < > 6.1*   < > 5.7* 5.3* 5.1 4.8 4.6 4.6  CL 118* 119*   < > 114*   < > 111  --  111 109 115*  --   --  118*  CO2 24 23   < > 25   < > 24  --  _0 --   --  26  GLUCOSE 131* 173*   < >  166*   < > 126*  --  175* 172* 156*  --   --  162*  BUN 53* 80*   < > 106*   < > 122*  --  140* 157* 158*  --   --  154*  CREATININE 0.71 1.15*   < > 2.29*   < > 2.45*  --  2.64* 2.83* 2.59*  --   --  2.12*  CALCIUM 8.8* 9.0   < > 8.2*   < > 8.2*  --  8.0* 8.3* 8.7*  --   --  9.6  MG 2.6* 2.8*  --  2.4  --  2.6*  --   --  2.5*  --   --   --   --   PHOS 4.0 5.6*  --  5.9*  --  6.5*  --   --  6.6*  --   --   --   --    < > = values in this interval not displayed.   GFR: Estimated Creatinine Clearance: 33.2 mL/min (A) (by C-G formula based on SCr of 2.12 mg/dL (H)). Recent Labs  Lab 12/27/21 0433 12/27/21 1647 12/27/21 1930 12/28/21 0412 12/28/21 1754 12/29/21 0327 12/30/21 0323 12/31/21 0430 01/01/22 0348  PROCALCITON 5.19  --   --  3.67  --   --   --   --   --   WBC 16.9*  --   --  23.4*   < > 24.6* 25.7* 27.7* 30.5*  LATICACIDVEN  --  1.1 1.5  --   --   --   --   --   --    < > = values in this interval not  displayed.    Critical care time:    This patient is critically ill with multiple organ system failure which requires frequent high complexity decision making, assessment, support, evaluation, and titration of therapies. This was completed through the application of advanced monitoring technologies and extensive interpretation of multiple databases.  During this encounter critical care time was devoted to patient care services described in this note for 37 minutes.    Jacky Kindle, MD [facility] See Amion for pager If no response to pager, please call [phone] until 7pm After 7pm, Please call E-link [phone]

## 2022-01-02 ENCOUNTER — Inpatient Hospital Stay (HOSPITAL_COMMUNITY): Payer: 59

## 2022-01-02 LAB — POCT I-STAT 7, (LYTES, BLD GAS, ICA,H+H)
Acid-Base Excess: 0 mmol/L (ref 0.0–2.0)
Bicarbonate: 25.5 mmol/L (ref 20.0–28.0)
Calcium, Ion: 1.51 mmol/L (ref 1.15–1.40)
HCT: 26 % — ABNORMAL LOW (ref 36.0–46.0)
Hemoglobin: 8.8 g/dL — ABNORMAL LOW (ref 12.0–15.0)
O2 Saturation: 96 %
Patient temperature: 99
Potassium: 4 mmol/L (ref 3.5–5.1)
Sodium: 158 mmol/L — ABNORMAL HIGH (ref 135–145)
TCO2: 27 mmol/L (ref 22–32)
pCO2 arterial: 43.9 mmHg (ref 32–48)
pH, Arterial: 7.374 (ref 7.35–7.45)
pO2, Arterial: 88 mmHg (ref 83–108)

## 2022-01-02 LAB — BASIC METABOLIC PANEL
Anion gap: 9 (ref 5–15)
BUN: 134 mg/dL — ABNORMAL HIGH (ref 6–20)
CO2: 25 mmol/L (ref 22–32)
Calcium: 9.9 mg/dL (ref 8.9–10.3)
Chloride: 122 mmol/L — ABNORMAL HIGH (ref 98–111)
Creatinine, Ser: 1.55 mg/dL — ABNORMAL HIGH (ref 0.44–1.00)
GFR, Estimated: 40 mL/min — ABNORMAL LOW (ref >60)
Glucose, Bld: 168 mg/dL — ABNORMAL HIGH (ref 70–99)
Potassium: 4.1 mmol/L (ref 3.5–5.1)
Sodium: 156 mmol/L — ABNORMAL HIGH (ref 135–145)

## 2022-01-02 LAB — CBC WITH DIFFERENTIAL/PLATELET
Abs Immature Granulocytes: 0.7 10*3/uL — ABNORMAL HIGH (ref 0.00–0.07)
Basophils Absolute: 0 10*3/uL (ref 0.0–0.1)
Basophils Relative: 0 %
Eosinophils Absolute: 0 10*3/uL (ref 0.0–0.5)
Eosinophils Relative: 0 %
HCT: 29.5 % — ABNORMAL LOW (ref 36.0–46.0)
Hemoglobin: 9.3 g/dL — ABNORMAL LOW (ref 12.0–15.0)
Lymphocytes Relative: 4 %
Lymphs Abs: 1.3 10*3/uL (ref 0.7–4.0)
MCH: 32.4 pg (ref 26.0–34.0)
MCHC: 31.5 g/dL (ref 30.0–36.0)
MCV: 102.8 fL — ABNORMAL HIGH (ref 80.0–100.0)
Metamyelocytes Relative: 1 %
Monocytes Absolute: 0 10*3/uL — ABNORMAL LOW (ref 0.1–1.0)
Monocytes Relative: 0 %
Myelocytes: 1 %
Neutro Abs: 31.7 10*3/uL — ABNORMAL HIGH (ref 1.7–7.7)
Neutrophils Relative %: 94 %
Platelets: 166 10*3/uL (ref 150–400)
RBC: 2.87 MIL/uL — ABNORMAL LOW (ref 3.87–5.11)
RDW: 15.1 % (ref 11.5–15.5)
WBC: 33.7 10*3/uL — ABNORMAL HIGH (ref 4.0–10.5)
nRBC: 0 /100 WBC
nRBC: 0.1 % (ref 0.0–0.2)

## 2022-01-02 LAB — GLUCOSE, CAPILLARY
Glucose-Capillary: 117 mg/dL — ABNORMAL HIGH (ref 70–99)
Glucose-Capillary: 136 mg/dL — ABNORMAL HIGH (ref 70–99)
Glucose-Capillary: 138 mg/dL — ABNORMAL HIGH (ref 70–99)
Glucose-Capillary: 139 mg/dL — ABNORMAL HIGH (ref 70–99)
Glucose-Capillary: 158 mg/dL — ABNORMAL HIGH (ref 70–99)
Glucose-Capillary: 166 mg/dL — ABNORMAL HIGH (ref 70–99)

## 2022-01-02 LAB — LEGIONELLA CULTURE REFLEX

## 2022-01-02 LAB — CULTURE, RESPIRATORY W GRAM STAIN: Gram Stain: NONE SEEN

## 2022-01-02 LAB — LEGIONELLA PNEUMOPHILA/CULTURE: Legionella Pneumophila DFA: NEGATIVE

## 2022-01-02 MED ORDER — INSULIN ASPART 100 UNIT/ML IJ SOLN
2.0000 [IU] | INTRAMUSCULAR | Status: DC
  Administered 2022-01-03 – 2022-01-07 (×13): 2 [IU] via SUBCUTANEOUS
  Administered 2022-01-07: 4 [IU] via SUBCUTANEOUS
  Administered 2022-01-07 – 2022-01-08 (×3): 2 [IU] via SUBCUTANEOUS
  Administered 2022-01-08: 4 [IU] via SUBCUTANEOUS

## 2022-01-02 MED ORDER — ETOMIDATE 2 MG/ML IV SOLN
20.0000 mg | Freq: Once | INTRAVENOUS | Status: DC
  Filled 2022-01-02: qty 10

## 2022-01-02 MED ORDER — MIDAZOLAM HCL (PF) 5 MG/ML IJ SOLN: 5.0000 mg | Freq: Once | INTRAMUSCULAR | Status: DC

## 2022-01-02 MED ORDER — FENTANYL CITRATE PF 50 MCG/ML IJ SOSY
200.0000 ug | PREFILLED_SYRINGE | Freq: Once | INTRAMUSCULAR | Status: AC
  Administered 2022-01-02: 100 ug via INTRAVENOUS
  Filled 2022-01-02: qty 4

## 2022-01-02 MED ORDER — MIDAZOLAM HCL 2 MG/2ML IJ SOLN
5.0000 mg | Freq: Once | INTRAMUSCULAR | Status: AC
  Administered 2022-01-02: 5 mg via INTRAVENOUS
  Filled 2022-01-02: qty 6

## 2022-01-02 MED ORDER — SODIUM CHLORIDE 0.9 % IV SOLN
100.0000 mg | Freq: Two times a day (BID) | INTRAVENOUS | Status: DC
  Administered 2022-01-02: 100 mg via INTRAVENOUS
  Filled 2022-01-02 (×2): qty 100

## 2022-01-02 MED ORDER — ROCURONIUM BROMIDE 50 MG/5ML IV SOLN
100.0000 mg | Freq: Once | INTRAVENOUS | Status: DC
  Filled 2022-01-02: qty 10

## 2022-01-02 MED ORDER — FENTANYL CITRATE PF 50 MCG/ML IJ SOSY
200.0000 ug | PREFILLED_SYRINGE | Freq: Once | INTRAMUSCULAR | Status: DC
  Filled 2022-01-02: qty 4

## 2022-01-02 MED ORDER — ROCURONIUM BROMIDE 10 MG/ML (PF) SYRINGE
100.0000 mg | PREFILLED_SYRINGE | Freq: Once | INTRAVENOUS | Status: AC
  Administered 2022-01-02: 100 mg via INTRAVENOUS
  Filled 2022-01-02: qty 10

## 2022-01-02 MED ORDER — LEVOFLOXACIN IN D5W 750 MG/150ML IV SOLN
750.0000 mg | INTRAVENOUS | Status: DC
  Administered 2022-01-03 – 2022-01-08 (×6): 750 mg via INTRAVENOUS
  Filled 2022-01-02 (×6): qty 150

## 2022-01-02 MED ORDER — ROCURONIUM BROMIDE 10 MG/ML (PF) SYRINGE
PREFILLED_SYRINGE | INTRAVENOUS | Status: AC
  Filled 2022-01-02: qty 10

## 2022-01-02 MED ORDER — ETOMIDATE 2 MG/ML IV SOLN
20.0000 mg | Freq: Once | INTRAVENOUS | Status: AC
  Administered 2022-01-02: 20 mg via INTRAVENOUS

## 2022-01-02 MED ORDER — DEXTROSE 5 % IV SOLN: INTRAVENOUS | Status: DC

## 2022-01-02 MED ORDER — ETOMIDATE 2 MG/ML IV SOLN
INTRAVENOUS | Status: AC
  Filled 2022-01-02: qty 10

## 2022-01-02 MED ORDER — MIDAZOLAM HCL 2 MG/2ML IJ SOLN
5.0000 mg | Freq: Once | INTRAMUSCULAR | Status: DC
  Filled 2022-01-02: qty 6

## 2022-01-02 MED ORDER — METHYLPREDNISOLONE SODIUM SUCC 40 MG IJ SOLR
40.0000 mg | INTRAMUSCULAR | Status: DC
  Administered 2022-01-03: 40 mg via INTRAVENOUS
  Filled 2022-01-02: qty 1

## 2022-01-02 MED ORDER — LIDOCAINE-EPINEPHRINE (PF) 2 %-1:200000 IJ SOLN
INTRAMUSCULAR | Status: AC
  Filled 2022-01-02: qty 20

## 2022-01-02 NOTE — Procedures (Signed)
Bronchoscopy Procedure Note  Len C Donate  [id number]  Jun 20, 1970  Date:01/02/22  Time:3:05 PM   Provider Performing:Parish Augustine C Katrinka Blazing   Procedure(s):  Subsequent Therapeutic Aspiration of Tracheobronchial Tree [phone])  Indication(s) Mucus plugging of bronchi, assistance with tracheostomy placement  Consent Risks of the procedure as well as the alternatives and risks of each were explained to the patient and/or caregiver.  Consent for the procedure was obtained and is signed in the bedside chart  Anesthesia In place for tracheostomy   Time Out Verified patient identification, verified procedure, site/side was marked, verified correct patient position, special equipment/implants available, medications/allergies/relevant history reviewed, required imaging and test results available.   Sterile Technique Usual hand hygiene, masks, gowns, and gloves were used   Procedure Description Bronchoscope advanced through endotracheal tube and into airway.  Airways were examined down to subsegmental level with findings noted below.   Following diagnostic evaluation, Therapeutic aspiration performed in RLL, LLL   Then direct visualization of percutaneous tracheostomy performed.  Findings:  - Mucus plugging primarily in lower lobes worse on left - Tracheostomy tube placed in appropriate position with cuff entirely within tracheal lumen   Complications/Tolerance None; patient tolerated the procedure well. Chest X-ray is not needed post procedure.   EBL Minimal   Specimen(s) None

## 2022-01-02 NOTE — Progress Notes (Incomplete)
Nutrition Follow-up  DOCUMENTATION CODES:   Obesity unspecified  INTERVENTION:   If plan for trach, recommend Cortrak insertion  Tube Feeding via OG:    NUTRITION DIAGNOSIS:   Inadequate oral intake related to inability to eat as evidenced by NPO status.  Being addressed via TF   GOAL:   Patient will meet greater than or equal to 90% of their needs  Met via TF  MONITOR:   TF tolerance  REASON FOR ASSESSMENT:   Consult Enteral/tube feeding initiation and management  ASSESSMENT:   51 y.o. female admits to [facility] related to SOB, nausea/vomiting, requiring mechanical ventilation. Pt transferred to [facility] for ARDS management and possible need for ECMO.  11/26 Admitted to [facility] ICU 11/27 Intubated, TF initiated 11/29 Bronch 12/02 Transfer to [facility] 12/03 Trickle TF started  Pt remains on vent support, likely require trach per MD  Nepro at 40 ml/hr with Pro-Source TF20 60 mL daily via OG  Current wt 87.4 kg, weight 90 kg on 12/05 UOP 3.2 L in 24 hours. Net negative 10 L since admit to [facility]  Hypernatremia, degree of azotemia likely related to free water deficit. Post ATN diuresis per MD notes. Currently on IVF hydration in addition to free water flush. Noted IV fluids changed to D5 at 50 today Free water flush currently at 200 mL q 2 hours providing 2400 mL in 24 hours  Constipation has resolved; still on several meds for bowel regimen, consider decreasing or discontinuing/changing to prn  Labs: sodium (156), BUN 134 (H), Creatinine 1.55, potassium wdl, no phosphorus since 12/04. Corrected calcium 11.98 (H), albumin <1.5 Meds: lactulose, solumedrol, ss novolog, senna, colace, miralax  Diet Order:   Diet Order             Diet NPO time specified  Diet effective now                   EDUCATION NEEDS:   Not appropriate for education at this time  Skin:  Skin Assessment: Reviewed RN Assessment  Last BM:  12/7 type 7 (no rectal tube)  Height:   Ht  Readings from Last 1 Encounters:  12/31/21 _0  (1.6 m)    Weight:   Wt Readings from Last 1 Encounters:  01/02/22 87.4 kg    Ideal Body Weight:  52.3 kg  BMI:  Body mass index is 34.13 kg/m.  Estimated Nutritional Needs:   Kcal:  1550-1830 kcals  Protein:  80-95  Fluid:  >/= 1.8 L   Kerman Passey MS, RDN, LDN, CNSC Registered Dietitian 3 Clinical Nutrition RD Pager and On-Call Pager Number Located in [place]

## 2022-01-02 NOTE — Progress Notes (Signed)
eLink Physician-Brief Progress Note Patient Name: Janet Hensley DOB: [birth year] MRN: [id number]   Date of Service  01/02/2022  HPI/Events of Note  Hypernatremia - Na+ = 158. Not able to get free water per tube. No gastric tube at this time.   eICU Interventions  Plan: Increase D5W IV infusion to 100 mL/hour.     Intervention Category Major Interventions: Electrolyte abnormality - evaluation and management  Alton Tremblay Eugene 01/02/2022, 9:02 PM

## 2022-01-02 NOTE — Progress Notes (Signed)
PHARMACY NOTE:  ANTIMICROBIAL RENAL DOSAGE ADJUSTMENT  Current antimicrobial regimen includes a mismatch between antimicrobial dosage and estimated renal function.  As per policy approved by the Pharmacy & Therapeutics and Medical Executive Committees, the antimicrobial dosage will be adjusted accordingly.  Current antimicrobial dosage:  Levaquin 750 mg IV every 48 hours  Indication: Legionella PNA  Renal Function:  Estimated Creatinine Clearance: 45 mL/min (A) (by C-G formula based on SCr of 1.55 mg/dL (H)). []      On intermittent HD, scheduled: []      On CRRT    Antimicrobial dosage has been changed to:  Levofloxacin 750 mg IV every 24 hours  Additional comments: Noted rapidly recovering AKI with SCr drop from 2.12 >> 1.55, UOP 1.5 m/kg/hr, CrCl presumably likely closer to >50 ml/min.   Thank you for allowing pharmacy to be a part of this patient's care.  , PharmD, BCPS Infectious Diseases Clinical Pharmacist 01/02/2022 12:56 PM   **Pharmacist phone directory can now be found on [email or web address] ([email or web address]).  Listed under [facility].

## 2022-01-02 NOTE — Progress Notes (Addendum)
Janet Hensley for Infectious Disease   Reason for visit: follow up on pneumonia  Interval History:  had bronchoscopy yesterday with very difficult to suction secretions. FiO2 down to 60%.    Husband at bedside Day 12 total antibiotics Day 7 levaquin Day 8 valacyclovir Day 7 minocycline Day 3 ampicillin  Physical Exam: Constitutional:  Vitals:   01/02/22 0900 01/02/22 1052  BP:    Pulse: (!) 127 (!) 122  Resp: (!) 31 (!) 30  Temp:    SpO2: 92% 95%  She is in nad Eyes: anicteric HENT: +ET Respiratory: respiratory effort on vent  Review of Systems: Unable to assess due to patient factors  Lab Results  Component Value Date   WBC 33.7 (H) 01/02/2022   HGB 8.8 (L) 01/02/2022   HCT 26.0 (L) 01/02/2022   MCV 102.8 (H) 01/02/2022   PLT 166 01/02/2022    Lab Results  Component Value Date   CREATININE 1.55 (H) 01/02/2022   BUN 134 (H) 01/02/2022   NA 158 (H) 01/02/2022   K 4.0 01/02/2022   CL 122 (H) 01/02/2022   CO2 25 01/02/2022    Lab Results  Component Value Date   ALT 61 (H) 01/01/2022   AST 46 (H) 01/01/2022   ALKPHOS 89 01/01/2022     Microbiology: Recent Results (from the past 240 hour(s))  Culture, Respiratory w Gram Stain     Status: None   Collection Time: 12/23/21  1:32 PM   Specimen: Tracheal Aspirate; Respiratory  Result Value Ref Range Status   Specimen Description   Final    TRACHEAL ASPIRATE Performed at [facility], [address] [place], [place] [address]    Special Requests   Final    NONE Performed at [facility], [address] [place], [place] [address]    Gram Stain   Final    NO WBC SEEN NO ORGANISMS SEEN Performed at [facility], [place] [address] [place], [place] [address]    Culture   Final    RARE PSEUDOMONAS AERUGINOSA RARE STENOTROPHOMONAS MALTOPHILIA    Report Status 12/31/2021 FINAL  Final   Organism ID, Bacteria PSEUDOMONAS AERUGINOSA  Final   Organism ID, Bacteria  STENOTROPHOMONAS MALTOPHILIA  Final      Susceptibility   Pseudomonas aeruginosa - MIC*    CEFTAZIDIME 4 SENSITIVE Sensitive     CIPROFLOXACIN <=0.25 SENSITIVE Sensitive     GENTAMICIN <=1 SENSITIVE Sensitive     IMIPENEM 1 SENSITIVE Sensitive     PIP/TAZO 8 SENSITIVE Sensitive     CEFEPIME 2 SENSITIVE Sensitive     LEVOFLOXACIN Value in next row Sensitive      SENSITIVE1.0    * RARE PSEUDOMONAS AERUGINOSA   Stenotrophomonas maltophilia - MIC*    LEVOFLOXACIN Value in next row Sensitive      SENSITIVE1.0    TRIMETH/SULFA Value in next row Sensitive      SENSITIVE1.0    * RARE STENOTROPHOMONAS MALTOPHILIA  Culture, BAL-quantitative w Gram Stain     Status: Abnormal (Preliminary result)   Collection Time: 12/25/21 11:46 AM   Specimen: Bronchoalveolar Lavage; Respiratory  Result Value Ref Range Status   Specimen Description   Final    BRONCHIAL ALVEOLAR LAVAGE Performed at [facility], [address] [place], [place] [address]    Special Requests   Final    Normal Performed at [facility], [address] [place], [place] [address]    Gram Stain  Final    NO WBC SEEN MODERATE GRAM NEGATIVE RODS Performed at [facility], [place] [address] [place], [place] [address]    Culture (A)  Final    >=100,000 COLONIES/mL PSEUDOMONAS AERUGINOSA 80,000 COLONIES/mL STENOTROPHOMONAS MALTOPHILIA Sent to [place] for further susceptibility testing. PSEUDOMONAS AERUGINOSA    Report Status PENDING  Incomplete   Organism ID, Bacteria PSEUDOMONAS AERUGINOSA (A)  Final   Organism ID, Bacteria STENOTROPHOMONAS MALTOPHILIA (A)  Final      Susceptibility   Pseudomonas aeruginosa - MIC*    CEFTAZIDIME 4 SENSITIVE Sensitive     CIPROFLOXACIN <=0.25 SENSITIVE Sensitive     GENTAMICIN 2 SENSITIVE Sensitive     IMIPENEM 1 SENSITIVE Sensitive     PIP/TAZO 8 SENSITIVE Sensitive     CEFEPIME 2 SENSITIVE Sensitive     * >=100,000 COLONIES/mL PSEUDOMONAS AERUGINOSA    Stenotrophomonas maltophilia - MIC*    LEVOFLOXACIN 0.25 SENSITIVE Sensitive     TRIMETH/SULFA <=20 SENSITIVE Sensitive     * 80,000 COLONIES/mL STENOTROPHOMONAS MALTOPHILIA  Legionella Pneumophila/Culture     Status: None (Preliminary result)   Collection Time: 12/25/21 11:46 AM   Specimen: Bronchial Alveolar Lavage; Respiratory  Result Value Ref Range Status   Legionella Pneumophila DFA Negative Negative Final    Comment: (NOTE) Performed At: [facility] [place], [place] [id number] Rush Farmer MD [id number]    Legionella Species Culture PENDING  Incomplete   Source, Legionella Cul BRONCHIAL ALVEOLAR LAVAGE  Final    Comment: Performed at [facility], [place], [place] [address]  Acid Fast Smear (AFB)     Status: None   Collection Time: 12/27/21  4:01 PM   Specimen: Tracheal Aspirate; Sputum  Result Value Ref Range Status   AFB Specimen Processing WRORD  Final    Comment: (NOTE) Test not performed. The required specimen for the test ordered was not received. Received:Serum Requires:Sputum, bronchial washing Antonieta Iba was notified 12/31/2021    Acid Fast Smear NOT PERFORMED  Final    Comment: (NOTE) Test not performed Performed At: [facility] [place], [place] [id number] Rush Farmer MD [id number]    Source (AFB) BLOOD  Final    Comment: Performed at [facility], [address] [place], [place] [address]  Acid Fast Culture with reflexed sensitivities     Status: None   Collection Time: 12/27/21  4:01 PM   Specimen: Tracheal Aspirate; Sputum  Result Value Ref Range Status   Acid Fast Culture WRORD  Final    Comment: (NOTE) Test not performed. The required specimen for the test ordered was not received. Received:Serum Requires:Sputum, bronchial washing Antonieta Iba was notified 12/31/2021 Performed At: [facility] [place], [place]  [id number] Rush Farmer MD [id number]    Source of Sample BLOOD  Final    Comment: Performed at [facility], [address] [place], [place] [address]  Culture, Respiratory w Gram Stain     Status: None   Collection Time: 12/27/21  4:01 PM   Specimen: Tracheal Aspirate; Respiratory  Result Value Ref Range Status   Specimen Description   Final    TRACHEAL ASPIRATE Performed at [facility], [address] [place], [place] [address]    Special Requests   Final    NONE Performed at [facility], [address] [place], [place] [address]    Gram Stain   Final    RARE WBC PRESENT,BOTH PMN AND  MONONUCLEAR FEW GRAM NEGATIVE RODS    Culture   Final    MODERATE STENOTROPHOMONAS MALTOPHILIA FEW PSEUDOMONAS AERUGINOSA SUSCEPTIBILITIES PERFORMED ON PREVIOUS CULTURE WITHIN THE LAST 5 DAYS. Performed at [facility], [place] [address], [place], [place] [address]    Report Status 12/29/2021 FINAL  Final  Culture, blood (Routine X 2) w Reflex to ID Panel     Status: None   Collection Time: 12/27/21  8:18 PM   Specimen: BLOOD  Result Value Ref Range Status   Specimen Description BLOOD LAC  Final   Special Requests   Final    BOTTLES DRAWN AEROBIC AND ANAEROBIC Blood Culture adequate volume   Culture   Final    NO GROWTH 5 DAYS Performed at [facility], [address], [place], [place] [address]    Report Status 01/01/2022 FINAL  Final  Culture, blood (Routine X 2) w Reflex to ID Panel     Status: None   Collection Time: 12/27/21  8:25 PM   Specimen: BLOOD  Result Value Ref Range Status   Specimen Description BLOOD [facility]  Final   Special Requests   Final    BOTTLES DRAWN AEROBIC AND ANAEROBIC Blood Culture adequate volume   Culture   Final    NO GROWTH 5 DAYS Performed at [facility], [address] [place], [place] [address]    Report Status 01/01/2022 FINAL  Final  Acid Fast Smear (AFB)     Status: None   Collection  Time: 12/30/21 11:02 AM  Result Value Ref Range Status   AFB Specimen Processing Concentration  Final   Acid Fast Smear Negative  Final    Comment: (NOTE) Performed At: [facility] [place], [place] [id number] Rush Farmer MD XQ:[id number]    Source (AFB) EXPECTORATED SPUTUM  Final    Comment: Performed at [facility], [address] [place], [place] [address]  Culture, Respiratory w Gram Stain     Status: None   Collection Time: 12/30/21  2:18 PM   Specimen: Bronchoalveolar Lavage; Respiratory  Result Value Ref Range Status   Specimen Description BRONCHIAL ALVEOLAR LAVAGE  Final   Special Requests L  Final   Gram Stain   Final    MODERATE WBC PRESENT, PREDOMINANTLY PMN NO ORGANISMS SEEN    Culture   Final    RARE Normal respiratory flora-no Staph aureus or Pseudomonas seen Performed at [facility], [address] [place], [place] [address]    Report Status 01/01/2022 FINAL  Final  Culture, Respiratory w Gram Stain     Status: None (Preliminary result)   Collection Time: 12/30/21  2:18 PM   Specimen: Bronchoalveolar Lavage; Respiratory  Result Value Ref Range Status   Specimen Description BRONCHIAL ALVEOLAR LAVAGE  Final   Special Requests R  Final   Gram Stain NO WBC SEEN NO ORGANISMS SEEN   Final   Culture   Final    FEW YEAST IDENTIFICATION TO FOLLOW Performed at [facility], [address], [place], [place] [address]    Report Status PENDING  Incomplete    Impression/Plan:  1. Pneumonia - multiple organisms and no new growth or new concerns.  She has been on prolonged antibiotics for these organisms.  At this point, will have her continue with the minocycline for 10 days and levaquin for 14 days for the Legionella.    2. Respiratory failure - some incremental improvements in her FiO2 and no  other changes.  Continue supportive care per CCM.   3.  Bacteremia - Rotia in one blood culture.  Now has completed prolonged  treatment.  Will stop ampicillin.    4. Medication monitoring - some improvement in her renal function.  Will consider adjustment of the levaquin dose per pharmacy.    I will otherwise sign off, call with any new concerns.

## 2022-01-02 NOTE — TOC Initial Note (Signed)
Transition of Care [facility]) - Initial/Assessment Note    Patient Details  Name: Janet Hensley MRN: [id number] Date of Birth: 12/21/1970  Transition of Care [facility]) CM/SW Contact:    Gala Lewandowsky, RN Phone Number: 01/02/2022, 2:05 PM  Clinical Narrative:   Patient was transferred from [facility]- for shortness of breath and hypoxia-severe pneumonia. ID is following the patient. PTA patient was from home with spouse. Patient was discussed in morning rounds plan for trach today per nursing staff. Case Manager will continue to follow for transition of care needs.               Expected Discharge Plan:  (TBD) Barriers to Discharge: Continued Medical Work up Expected Discharge Plan and Services Expected Discharge Plan:  (TBD)       Living arrangements for the past 2 months: Single Family Home   Prior Living Arrangements/Services Living arrangements for the past 2 months: Single Family Home Lives with:: Spouse Patient language and need for interpreter reviewed:: Yes        Need for Family Participation in Patient Care: Yes (Comment) Care giver support system in place?: Yes (comment)   Criminal Activity/Legal Involvement Pertinent to Current Situation/Hospitalization: No - Comment as needed  Permission Sought/Granted Permission sought to share information with : Family Supports, Case Manager  Alcohol / Substance Use: Not Applicable Psych Involvement: No (comment)  Admission diagnosis:  ARDS (adult respiratory distress syndrome) (HCC) [J80] Patient Active Problem List   Diagnosis Date Noted   ARDS (adult respiratory distress syndrome) (HCC) 12/28/2021   Pneumonia of left lung due to Pseudomonas species (HCC) 12/27/2021   AKI (acute kidney injury) (HCC) 12/27/2021   Adenopathy 12/25/2021   Coronary artery disease involving native coronary artery of native heart without angina pectoris 12/25/2021   ST elevation myocardial infarction (STEMI) (HCC) 12/24/2021   Respiratory  distress 12/22/2021   Acute respiratory failure with hypoxia and hypercapnia (HCC) 12/22/2021   Abnormal EKG 12/22/2021   Electrolyte abnormality 12/22/2021   Tobacco use 01/15/2012   PCP:  Gildardo Pounds, PA Pharmacy:  No Pharmacies Listed  Readmission Risk Interventions     No data to display

## 2022-01-02 NOTE — Progress Notes (Signed)
Patient with new tracheostomy. Orders for SLP eval and treat for PMSV and swallowing received. Will follow pt closely for readiness for SLP interventions as appropriate.   Angela Nevin, MA, CCC-SLP Speech Therapy

## 2022-01-02 NOTE — Procedures (Signed)
Percutaneous Tracheostomy Procedure Note   Melenda C Baumgarner  [id number]  [birth year]  Date:01/02/22  Time:6:16 PM   Provider Performing:Vidit Boissonneault  Procedure: Percutaneous Tracheostomy with Bronchoscopic Guidance (75170)  Indication(s) Acute Respiratory failure  Consent Risks of the procedure as well as the alternatives and risks of each were explained to the patient and/or caregiver.  Consent for the procedure was obtained.  Anesthesia Etomidate, Versed, Fentanyl, Vecuronium   Time Out Verified patient identification, verified procedure, site/side was marked, verified correct patient position, special equipment/implants available, medications/allergies/relevant history reviewed, required imaging and test results available.   Sterile Technique Maximal sterile technique including sterile barrier drape, hand hygiene, sterile gown, sterile gloves, mask, hair covering.    Procedure Description Appropriate anatomy identified by palpation.  Patient's neck prepped and draped in sterile fashion.  1% lidocaine with epinephrine was used to anesthetize skin overlying neck.  1.5cm incision made and blunt dissection performed until tracheal rings could be easily palpated.   Then a size 6 Shiley tracheostomy was placed under bronchoscopic visualization using usual Seldinger technique and serial dilation.   Bronchoscope confirmed placement above the carina.  Tracheostomy was sutured in place with adhesive pad to protect skin under pressure.    Patient connected to ventilator.   Complications/Tolerance None; patient tolerated the procedure well. Chest X-ray is ordered to confirm no post-procedural complication.   EBL Minimal   Specimen(s) None

## 2022-01-02 NOTE — Progress Notes (Signed)
NAME:  Janet Hensley, MRN:  [id number], DOB:  1970/08/11, LOS: 5 ADMISSION DATE:  12/28/2021, CONSULTATION DATE:  01/02/2022  REFERRING MD:  Willette Cluster, CHIEF COMPLAINT: Severe pneumonia  History of Present Illness:  51 year old smoker presented to [facility] 11/26 with cough, shortness of breath nausea and vomiting, hypoxic to 89%, required intubation for mechanical ventilation. Chest x-ray showed complete whiteout of left lung.  Bronchoscopy showed purulent secretions on the left which were suctioned out. History of snorting crushed oxycodone, use of hot tub Blood cultures showed Rothia Mucilaginosa, urine tested positive for Legionella, bronchoscopy was positive for Pseudomonas and stenotrophomonas .  ID consult was obtained she was treated with cefepime and levofloxacin and nebulized tobramycin .  Course was complicated by AKI and bicarbonate drip was started She was transferred to [facility] for ARDS management and possible need for ECMO  Pertinent  Medical History    Significant Hospital Events: Including procedures, antibiotic start and stop dates in addition to other pertinent events   CTA chest -complete opacification of left lung, multiple pulmonary nodules, mediastinal and right hilar lymphadenopathy 12/2 transfer to [facility]  Interim History / Subjective:  FiO2 was titrated down to 50% Remain afebrile Paralytics were stopped yesterday Remain sedated with Versed and Dilaudid  Objective   Blood pressure 124/61, pulse (!) 120, temperature 99 F (37.2 C), resp. rate (!) 30, height 5\' 3"  (1.6 m), weight 87.4 kg, last menstrual period 01/16/2016, SpO2 95 %.    Vent Mode: PRVC FiO2 (%):  [50 %-60 %] 50 % Set Rate:  [30 bmp] 30 bmp Vt Set:  [420 mL] 420 mL PEEP:  [10 cmH20] 10 cmH20 Plateau Pressure:  [23 cmH20-26 cmH20] 24 cmH20   Intake/Output Summary (Last 24 hours) at 01/02/2022 1144 Last data filed at 01/02/2022 0900 Gross per 24 hour  Intake 3037.24 ml  Output 2900 ml  Net  137.24 ml   Filed Weights   12/31/21 0800 01/01/22 0400 01/02/22 0400  Weight: 90 kg 88.9 kg 87.4 kg    Examination:   Physical exam: General: Crtitically ill-appearing female, orally intubated HEENT: [place]/AT, eyes anicteric.  ETT and OGT in place Neuro: Sedated, not following commands.  Eyes are closed.  Pupils 4 mm bilateral reactive to light, weak cough and gag present Chest: Reduced air and clear on left side, clear to auscultation otherwise, no wheezes or rhonchi Heart: Regular rhythm, tachycardic, no murmurs or gallops Abdomen: Soft, nondistended, bowel sounds present Skin: Perioral vesicular rash noted with ulceration on the upper lip   K+ 4.1 Na 156 BUN 134 Cr 1.55 WBC 33.7 H/H 9.3/29.5 Platelets 166  Resolved Hospital Problem list   Critical illness thrombocytopenia Hyperkalemia Hyperphosphatemia  Assessment & Plan:  Acute hypoxic/hypercapnic respiratory failure with ARDS in the setting of polymicrobial pneumonia with complete whiteout of left lung, mild RLL pneumonia-- possibly due to hot tub exposures Pseudomonas/stenotrophomonas pneumonia Legionella pneumonia Probable aspiration with history of snorting oxycodone COPD, not in exacerbation Continue lung protective ventilation, FiO2 was titrated down to 50%, her P/F ratio improved to 176  VAP prevention protocol PAD protocol for sedation with fentanyl and midazolam Nimbex was stopped yesterday Patient is going for tracheostomy today, will try to stop sedation tomorrow as she continued to show improvement Peak, plateau and driving pressures are at goal Continue ampicillin, Levaquin, minocycline, inhaled tobramycin per discussion with infectious disease Decrease Solu-Medrol to once daily Continue bronchodilators  Severe sepsis with AKI and respiratory failure due to bacteremia - Rothia mucilaginosa and polymicrobial  bacteremia, POA Per history from significant other patient snorts oxycodone Continue ampicillin  per discussion with infectious disease  AKI due to septic ATN with hypervolemia Hypernatremia Patient serum creatinine continue to improve now at 1.5, she made 3.2 L of urine in last 24 hours Change IV fluid to D5 W at 50 cc an hour, that will help correct hyponatremia and will gently hydrate Serum sodium continue to trend up currently at 154 Avoid nephrotoxic agent Monitor intake and output  Perioral herpes Continue valacyclovir   Chronic pain on chronic opiates PAD protocol Will resume orals when extubated  Prediabetes Obesity Blood sugars remain at goal of 140-180 Continue sliding scale insulin Continue tube feeds  Nonobstructive coronary artery disease  Acute ST elevation MI was ruled out with no wall motion abnormalities on echo and LHC showed nonobstructive coronary artery disease   Anemia of chronic disease thrombocytopenia  likely 2/2 sepsis H&H remained stable around 9 and platelet counts are trending up, currently at 161 Closely monitor CBC  Best Practice (right click and "Reselect all SmartList Selections" daily)   Diet/type: tubefeeds DVT prophylaxis: prophylactic heparin  GI prophylaxis: PPI Lines: Central line Foley:  Yes, and it is still needed Code Status:  full code Last date of multidisciplinary goals of care discussion [12/7: Patient's husband was updated at bedside, decided to proceed with tracheostomy and continue full scope of care    Labs   CBC: Recent Labs  Lab 12/28/21 1754 12/29/21 0327 12/29/21 0334 12/30/21 0323 12/31/21 0430 12/31/21 0816 01/01/22 0347 01/01/22 0348 01/02/22 0411 01/02/22 1053  WBC 21.8* 24.6*  --  25.7* 27.7*  --   --  30.5* 33.7*  --   NEUTROABS 20.7*  --   --   --  26.3*  --   --  25.8* 31.7*  --   HGB 9.6* 9.2*   < > 9.7* 9.8* 9.2* 9.2* 9.8* 9.3* 8.8*  HCT 30.8* 30.0*   < > 30.4* 29.6* 27.0* 27.0* 30.6* 29.5* 26.0*  MCV 102.3* 102.4*  --  98.7 97.7  --   --  100.0 102.8*  --   PLT 99* 99*  --  127* 142*  --    --  161 166  --    < > = values in this interval not displayed.    Basic Metabolic Panel: Recent Labs  Lab 12/27/21 0433 12/27/21 1601 12/28/21 1754 12/28/21 2205 12/29/21 0327 12/29/21 0334 12/29/21 1529 12/30/21 0323 12/31/21 0430 12/31/21 0816 01/01/22 0347 01/01/22 0348 01/02/22 0411 01/02/22 1053  NA 148*   < > 145   < > 145   < > 144 147* 151* 151* 154* 154* 156* 158*  K 4.8   < > 6.1*   < > 6.1*   < > 5.7* 5.3* 5.1 4.8 4.6 4.6 4.1 4.0  CL 119*   < > 114*   < > 111  --  111 109 115*  --   --  118* 122*  --   CO2 23   < > 25   < > 24  --  22 24 25   --   --  26 25  --   GLUCOSE 173*   < > 166*   < > 126*  --  175* 172* 156*  --   --  162* 168*  --   BUN 80*   < > 106*   < > 122*  --  140* 157* 158*  --   --  154* 134*  --  CREATININE 1.15*   < > 2.29*   < > 2.45*  --  2.64* 2.83* 2.59*  --   --  2.12* 1.55*  --   CALCIUM 9.0   < > 8.2*   < > 8.2*  --  8.0* 8.3* 8.7*  --   --  9.6 9.9  --   MG 2.8*  --  2.4  --  2.6*  --   --  2.5*  --   --   --   --   --   --   PHOS 5.6*  --  5.9*  --  6.5*  --   --  6.6*  --   --   --   --   --   --    < > = values in this interval not displayed.   GFR: Estimated Creatinine Clearance: 45 mL/min (A) (by C-G formula based on SCr of 1.55 mg/dL (H)). Recent Labs  Lab 12/27/21 0433 12/27/21 1647 12/27/21 1930 12/28/21 0412 12/28/21 1754 12/30/21 0323 12/31/21 0430 01/01/22 0348 01/02/22 0411  PROCALCITON 5.19  --   --  3.67  --   --   --   --   --   WBC 16.9*  --   --  23.4*   < > 25.7* 27.7* 30.5* 33.7*  LATICACIDVEN  --  1.1 1.5  --   --   --   --   --   --    < > = values in this interval not displayed.    Critical care time:    This patient is critically ill with multiple organ system failure which requires frequent high complexity decision making, assessment, support, evaluation, and titration of therapies. This was completed through the application of advanced monitoring technologies and extensive interpretation of  multiple databases.  During this encounter critical care time was devoted to patient care services described in this note for 35 minutes.    Jacky Kindle, MD [facility] See Amion for pager If no response to pager, please call [phone] until 7pm After 7pm, Please call E-link [phone]

## 2022-01-03 ENCOUNTER — Inpatient Hospital Stay (HOSPITAL_COMMUNITY): Payer: 59

## 2022-01-03 LAB — CBC WITH DIFFERENTIAL/PLATELET
Abs Immature Granulocytes: 0 10*3/uL (ref 0.00–0.07)
Basophils Absolute: 0 10*3/uL (ref 0.0–0.1)
Basophils Relative: 0 %
Eosinophils Absolute: 0 10*3/uL (ref 0.0–0.5)
Eosinophils Relative: 0 %
HCT: 27.5 % — ABNORMAL LOW (ref 36.0–46.0)
Hemoglobin: 8.6 g/dL — ABNORMAL LOW (ref 12.0–15.0)
Lymphocytes Relative: 1 %
Lymphs Abs: 0.3 10*3/uL — ABNORMAL LOW (ref 0.7–4.0)
MCH: 32.5 pg (ref 26.0–34.0)
MCHC: 31.3 g/dL (ref 30.0–36.0)
MCV: 103.8 fL — ABNORMAL HIGH (ref 80.0–100.0)
Monocytes Absolute: 0.3 10*3/uL (ref 0.1–1.0)
Monocytes Relative: 1 %
Neutro Abs: 27.8 10*3/uL — ABNORMAL HIGH (ref 1.7–7.7)
Neutrophils Relative %: 98 %
Platelets: 147 10*3/uL — ABNORMAL LOW (ref 150–400)
RBC: 2.65 MIL/uL — ABNORMAL LOW (ref 3.87–5.11)
RDW: 14.9 % (ref 11.5–15.5)
WBC: 28.4 10*3/uL — ABNORMAL HIGH (ref 4.0–10.5)
nRBC: 0 % (ref 0.0–0.2)
nRBC: 0 /100 WBC

## 2022-01-03 LAB — BLOOD GAS, ARTERIAL
Acid-base deficit: 6.7 mmol/L — ABNORMAL HIGH (ref 0.0–2.0)
Bicarbonate: 24.9 mmol/L (ref 20.0–28.0)
FIO2: 80 %
MECHVT: 400 mL
Mechanical Rate: 16
O2 Saturation: 93.8 %
PEEP: 10 cmH2O
Patient temperature: 37
pCO2 arterial: 82 mmHg (ref 32–48)
pH, Arterial: 7.09 — CL (ref 7.35–7.45)
pO2, Arterial: 75 mmHg — ABNORMAL LOW (ref 83–108)

## 2022-01-03 LAB — GLUCOSE, CAPILLARY
Glucose-Capillary: 106 mg/dL — ABNORMAL HIGH (ref 70–99)
Glucose-Capillary: 108 mg/dL — ABNORMAL HIGH (ref 70–99)
Glucose-Capillary: 114 mg/dL — ABNORMAL HIGH (ref 70–99)
Glucose-Capillary: 123 mg/dL — ABNORMAL HIGH (ref 70–99)
Glucose-Capillary: 128 mg/dL — ABNORMAL HIGH (ref 70–99)

## 2022-01-03 LAB — BASIC METABOLIC PANEL
Anion gap: 6 (ref 5–15)
BUN: 102 mg/dL — ABNORMAL HIGH (ref 6–20)
CO2: 24 mmol/L (ref 22–32)
Calcium: 9.3 mg/dL (ref 8.9–10.3)
Chloride: 125 mmol/L — ABNORMAL HIGH (ref 98–111)
Creatinine, Ser: 1.14 mg/dL — ABNORMAL HIGH (ref 0.44–1.00)
GFR, Estimated: 58 mL/min — ABNORMAL LOW (ref >60)
Glucose, Bld: 120 mg/dL — ABNORMAL HIGH (ref 70–99)
Potassium: 3 mmol/L — ABNORMAL LOW (ref 3.5–5.1)
Sodium: 155 mmol/L — ABNORMAL HIGH (ref 135–145)

## 2022-01-03 LAB — MIC RESULTS (2 DRUGS)

## 2022-01-03 MED ORDER — BANATROL TF EN LIQD
60.0000 mL | Freq: Two times a day (BID) | ENTERAL | Status: DC
  Administered 2022-01-03 – 2022-01-08 (×11): 60 mL
  Filled 2022-01-03 (×11): qty 60

## 2022-01-03 MED ORDER — POTASSIUM CHLORIDE 10 MEQ/50ML IV SOLN
10.0000 meq | INTRAVENOUS | Status: AC
  Administered 2022-01-03 (×6): 10 meq via INTRAVENOUS
  Filled 2022-01-03 (×6): qty 50

## 2022-01-03 MED ORDER — OXYCODONE HCL 5 MG/5ML PO SOLN: 7.5000 mg | Freq: Four times a day (QID) | ORAL | Status: DC

## 2022-01-03 MED ORDER — OXYCODONE HCL 5 MG PO TABS
7.5000 mg | ORAL_TABLET | Freq: Four times a day (QID) | ORAL | Status: DC
  Administered 2022-01-03 – 2022-01-04 (×5): 7.5 mg
  Filled 2022-01-03 (×5): qty 2

## 2022-01-03 MED ORDER — CLONAZEPAM 0.25 MG PO TBDP
0.5000 mg | ORAL_TABLET | Freq: Two times a day (BID) | ORAL | Status: DC
  Administered 2022-01-03 – 2022-01-07 (×10): 0.5 mg
  Filled 2022-01-03 (×10): qty 2

## 2022-01-03 NOTE — Evaluation (Signed)
Physical Therapy Evaluation Patient Details Name: Janet Hensley MRN: [id number] DOB: Aug 09, 1970 Today's Date: 01/03/2022  History of Present Illness  Pt is a 51 y.o. female admitted to [facility] 12/22/21 with cough, N/V and SOB with hypoxia requiring intubation; chest CTA with complete opacification of L lung. Workup for ARDS secondary to polymicrobial PNA, pseudomonas/stenotrophomonas PNA, Legionella PNA, probably aspiration with h/o snorting oxycodone. Transfer to [facility] 12/2 for ARDS management and possible ECMO. S/p trach placement 12/7. PMH includes tobacco use, asthma.   Clinical Impression  Pt presents with an overall decrease in functional mobility secondary to above. Husband reports that PTA, pt independent with mobility and ADLs, though primarily sedentary secondary to chronic back pain with sciatica. Today, pt dependent for mobility; no active BUE/BLE movement noted except minimal BLE movement to max command. Pt turning head and tracking to therapist and family appropriately with increased cues, some head nodding yes/no seemingly appropriate. Pt's husband and daughter present, educ on BUE/BLE PROM. Will follow acutely to address established goals.   SpO2 >/90% on 12L O2 via trach collar at 50% FiO2   Recommendations for follow up therapy are one component of a multi-disciplinary discharge planning process, led by the attending physician.  Recommendations may be updated based on patient status, additional functional criteria and insurance authorization.  Follow Up Recommendations PT at Long-term acute care hospital      Assistance Recommended at Discharge Frequent or constant Supervision/Assistance  Patient can return home with the following  Two people to help with walking and/or transfers;Two people to help with bathing/dressing/bathroom    Equipment Recommendations  (defer to next venue)  Recommendations for Other Services       Functional Status Assessment Patient has had a recent  decline in their functional status and demonstrates the ability to make significant improvements in function in a reasonable and predictable amount of time.     Precautions / Restrictions Precautions Precautions: Fall;Other (comment) Precaution Comments: trach collar, R femoral arterial line Restrictions Weight Bearing Restrictions: No      Mobility  Bed Mobility Overal bed mobility: Needs Assistance             General bed mobility comments: totalA for repositioning in bed    Transfers                   General transfer comment: will require maxisky lift OOB    Ambulation/Gait                  Stairs            Wheelchair Mobility    Modified Rankin (Stroke Patients Only)       Balance                                             Pertinent Vitals/Pain Pain Assessment Pain Assessment: Faces Faces Pain Scale: Hurts a little bit Pain Location: grimacing with end-range hip/knee PROM Pain Descriptors / Indicators: Grimacing Pain Intervention(s): Limited activity within patient's tolerance, Repositioned    Home Living Family/patient expects to be discharged to:: Private residence Living Arrangements: Spouse/significant other Available Help at Discharge: Family;Available PRN/intermittently Type of Home: House Home Access: Level entry       Home Layout: One level Home Equipment: None Additional Comments: info from spouse who works as Chiropractor Prior Level of Function :  Independent/Modified Independent;Driving             Mobility Comments: independent without, though limited by chronic back pain and primarily sedentary; will get out every now and then to brother's house, etc. has 3 dogs. enjoys getting in hot tub daily (though husband now has this drained)       Hand Dominance        Extremity/Trunk Assessment   Upper Extremity Assessment Upper Extremity Assessment: RUE deficits/detail;LUE  deficits/detail;Difficult to assess due to impaired cognition RUE Deficits / Details: no active movement noted in BUEs though fair tolerance to shoulder/elbow/wrist/finger PROM; notable hand swelling (R>L) LUE Deficits / Details: no active movement noted in BUEs though fair tolerance to shoulder/elbow/wrist/finger PROM; notable hand swelling (R>L)    Lower Extremity Assessment Lower Extremity Assessment: RLE deficits/detail;LLE deficits/detail;Difficult to assess due to impaired cognition RLE Deficits / Details: no active movement noted in BLEs though fair tolerance to hip/knee/ankle PROM with some grimacing with L end-range hip/knee stretch. husband reports baseline sciatica affecting both legs LLE Deficits / Details: no active movement noted in BLEs though fair tolerance to hip/knee/ankle PROM. husband reports baseline sciatica affecting both legs       Communication   Communication: No difficulties  Cognition Arousal/Alertness: Lethargic Behavior During Therapy: Flat affect Overall Cognitive Status: Impaired/Different from baseline                                 General Comments: pt minimally wiggling BLEs to command with max verbal cues to get attention. pt seemingly nodding yes/no appropriately to a few questions. seems to respond appropriately to husband giving her a hard time (i.e. attending to him and shaking head no as if to react to his joke). no active movement noted in BUEs        General Comments General comments (skin integrity, edema, etc.): pt's husband and daughter present, supportive    Exercises Other Exercises Other Exercises: educ pt's husband and daughter on BUE/BLE PROM (with limitations on R shoulder and R hip ROM due to lines)   Assessment/Plan    PT Assessment Patient needs continued PT services  PT Problem List Decreased strength;Decreased range of motion;Decreased activity tolerance;Decreased balance;Decreased mobility;Decreased  cognition;Decreased knowledge of use of DME;Cardiopulmonary status limiting activity       PT Treatment Interventions DME instruction;Gait training;Functional mobility training;Therapeutic activities;Therapeutic exercise;Balance training;Neuromuscular re-education;Cognitive remediation;Patient/family education;Wheelchair mobility training    PT Goals (Current goals can be found in the Care Plan section)  Acute Rehab PT Goals Patient Stated Goal: husband interested in LTAC after speaking with CM PT Goal Formulation: With family Time For Goal Achievement: 01/17/22 Potential to Achieve Goals: Fair    Frequency Min 2X/week     Co-evaluation               AM-PAC PT "6 Clicks" Mobility  Outcome Measure Help needed turning from your back to your side while in a flat bed without using bedrails?: Total Help needed moving from lying on your back to sitting on the side of a flat bed without using bedrails?: Total Help needed moving to and from a bed to a chair (including a wheelchair)?: Total Help needed standing up from a chair using your arms (e.g., wheelchair or bedside chair)?: Total Help needed to walk in hospital room?: Total Help needed climbing 3-5 steps with a railing? : Total 6 Click Score: 6    End of Session Equipment  Utilized During Treatment: Oxygen Activity Tolerance: Patient limited by lethargy Patient left: in bed;with bed alarm set;with nursing/sitter in room;with family/visitor present Nurse Communication: Mobility status;Need for lift equipment PT Visit Diagnosis: Other abnormalities of gait and mobility (R26.89);Muscle weakness (generalized) (M62.81)    Time: 6073-7106 PT Time Calculation (min) (ACUTE ONLY): 25 min   Charges:   PT Evaluation $PT Eval Moderate Complexity: 1 Mod PT Treatments $Therapeutic Exercise: 8-22 mins      Ina Homes, PT, DPT Acute Rehabilitation Services  Personal: Secure Chat Rehab Office: [phone]  Malachy Chamber 01/03/2022, 2:07 PM

## 2022-01-03 NOTE — TOC Progression Note (Addendum)
Transition of Care [facility]) - Progression Note    Patient Details  Name: Janet Hensley MRN: [id number] Date of Birth: 05-22-1970  [facility]) CM/SW Contact  Graves-Bigelow, Lamar Laundry, RN Phone Number: 01/03/2022, 11:11 AM  Clinical Narrative: Patient was discussed in morning rounds. MD discussed with Case Manager regarding plan of care and to submit information to LTAC. Information has been submitted to [facility] and [facility] to see if insurance will approve. Will await for updates regarding LTAC. Case Manager will continue to follow for additional transition of care needs.     1258 01-03-22 Both [facility] and [facility] states the patient is LTAC appropriate. Case Manager did speak with spouse to provide education regarding LTAC; spouse is agreeable to [facility]- he would like to tour the facility. Liaison is aware and will reach out to the spouse on Monday and hopefully submit for insurance authorization. Case Manager will continue to follow for transition of care needs.    Expected Discharge Plan:  (TBD) Barriers to Discharge: Continued Medical Work up  Expected Discharge Plan and Services Expected Discharge Plan:  (TBD)   Living arrangements for the past 2 months: Single Family Home  Readmission Risk Interventions     No data to display

## 2022-01-03 NOTE — Procedures (Signed)
Cortrak  Person Inserting Tube:  Dennice Tindol C, RD Tube Type:  Cortrak - 43 inches Tube Size:  10 Tube Location:  Left nare Secured by: Bridle Technique Used to Measure Tube Placement:  Marking at nare/corner of mouth Cortrak Secured At:  65 cm   Cortrak Tube Team Note:  Consult received to place a Cortrak feeding tube.   X-ray is required, abdominal x-ray has been ordered by the Cortrak team. Please confirm tube placement before using the Cortrak tube.   If the tube becomes dislodged please keep the tube and contact the Cortrak team at [email or web address] for replacement.  If after hours and replacement cannot be delayed, place a NG tube and confirm placement with an abdominal x-ray.    Junetta Hearn P., RD, LDN, CNSC See AMiON for contact information    

## 2022-01-03 NOTE — Progress Notes (Signed)
RT note-Patient placed on ATC at 50% with Sp02 93%. Will wean intermediately today with rest periods.

## 2022-01-03 NOTE — Progress Notes (Signed)
Pt placed in SBT 5/5 at 0729. Sedation turned down by RN.    01/03/22 0729  Adult Ventilator Settings  Vent Mode PSV;CPAP  FiO2 (%) 40 %  PEEP 5 cmH20  Pressure Support 5 cmH20  Adult Ventilator Measurements  Peak Airway Pressure 11 L/min  Mean Airway Pressure 7 cmH20  Resp Rate Spontaneous 17 br/min  Resp Rate Total 17 br/min  Measured Ve 7.1 mL  SpO2 100 %  Spont TV 343 mL

## 2022-01-03 NOTE — Progress Notes (Signed)
NAME:  Janet Hensley, MRN:  [id number], DOB:  07-Dec-1970, LOS: 6 ADMISSION DATE:  12/28/2021, CONSULTATION DATE:  01/03/2022  REFERRING MD:  Willette Cluster, CHIEF COMPLAINT: Severe pneumonia  History of Present Illness:  51 year old smoker presented to [facility] 11/26 with cough, shortness of breath nausea and vomiting, hypoxic to 89%, required intubation for mechanical ventilation. Chest x-ray showed complete whiteout of left lung.  Bronchoscopy showed purulent secretions on the left which were suctioned out. History of snorting crushed oxycodone, use of hot tub Blood cultures showed Rothia Mucilaginosa, urine tested positive for Legionella, bronchoscopy was positive for Pseudomonas and stenotrophomonas .  ID consult was obtained she was treated with cefepime and levofloxacin and nebulized tobramycin .  Course was complicated by AKI and bicarbonate drip was started She was transferred to [facility] for ARDS management and possible need for ECMO  Pertinent  Medical History   Past Medical History:  Diagnosis Date   Asthma 01/15/2012     Significant Hospital Events: Including procedures, antibiotic start and stop dates in addition to other pertinent events   CTA chest -complete opacification of left lung, multiple pulmonary nodules, mediastinal and right hilar lymphadenopathy 12/2 transfer to [facility] 12/8 percutaneous tracheostomy  Interim History / Subjective:  Remains somnolent on minimal sedation.  Did tolerate approximately 1 hour of trach collar this morning.  Objective   Blood pressure (!) 112/50, pulse (!) 119, temperature (P) 98.8 F (37.1 C), resp. rate (!) 30, height 5\' 3"  (1.6 m), weight 85.1 kg, last menstrual period 01/16/2016, SpO2 92 %. CVP:  [0 mmHg] 0 mmHg  Vent Mode: PSV;CPAP FiO2 (%):  [40 %-50 %] 50 % Set Rate:  [30 bmp] 30 bmp Vt Set:  [420 mL] 420 mL PEEP:  [5 cmH20-8 cmH20] 5 cmH20 Pressure Support:  [5 cmH20-10 cmH20] 10 cmH20 Plateau Pressure:  [22 cmH20-23 cmH20]  23 cmH20   Intake/Output Summary (Last 24 hours) at 01/03/2022 1248 Last data filed at 01/03/2022 1200 Gross per 24 hour  Intake 3078.76 ml  Output 3065 ml  Net 13.76 ml    Filed Weights   01/01/22 0400 01/02/22 0400 01/03/22 0500  Weight: 88.9 kg 87.4 kg 85.1 kg    Examination:   Physical exam: General: Crtitically ill-appearing female, HEENT: [place]/AT, eyes anicteric.  Tracheostomy in place with no bleeding at site. Neuro: Sedated, not following commands.  Eyes are closed.  Pupils 4 mm bilateral reactive to light, weak cough and gag present Chest: Clear to auscultation right side, bronchial breath sounds and crackles left side. Heart: Regular rhythm, tachycardic, no murmurs or gallops Abdomen: Soft, nondistended, bowel sounds present Skin: Perioral vesicular rash noted with ulceration on the upper lip  Ancillary tests personally reviewed:  Sodium elevated 155 Hypokalemia 3.0 Creatinine now 1.14 Improving leukocytosis 28.4.  Assessment & Plan:  Critically ill due to acute hypoxic/hypercapnic respiratory failure with ARDS in the setting of polymicrobial pneumonia  -Continue progressive trach collar trials twice daily. -Would be excellent LTAC candidate as anticipate prolonged weaning and significant deconditioning.  Pseudomonas/stenotrophomonas pneumonia Legionella pneumonia Probable aspiration with history of snorting oxycodone COPD, not in exacerbation -Continue ampicillin, Levaquin, minocycline, inhaled tobramycin per discussion with infectious disease -Stop corticosteroids -Continue bronchodilators..  Severe sepsis with AKI and respiratory failure due to bacteremia - Rothia mucilaginosa and polymicrobial bacteremia, POA -Septic shock has now resolved can discontinue arterial line  AKI due to septic ATN with hypervolemia -now resolved Hypernatremia -Start free water.  Perioral herpes -Continue valacyclovir   Chronic pain  on chronic opiates -Transition to enteral  medications and wean fentanyl to off.  Prediabetes Obesity -Blood sugars remain at goal of 140-180 -Continue sliding scale insulin -Continue tube feeds  Nonobstructive coronary artery disease  Acute ST elevation MI was ruled out with no wall motion abnormalities on echo and LHC showed nonobstructive coronary artery disease  -No intervention at this time.  Anemia of chronic disease thrombocytopenia  likely 2/2 sepsis -Continue to monitor  Best Practice (right click and "Reselect all SmartList Selections" daily)   Diet/type: tubefeeds will place core track feeding tube today DVT prophylaxis: prophylactic heparin  GI prophylaxis: PPI Lines: Central line Foley:  Yes, and it is still needed Code Status:  full code Last date of multidisciplinary goals of care discussion [12/8 husband updated at bedside]  CRITICAL CARE Performed by: Lynnell Catalan   Total critical care time: 35 minutes  Critical care time was exclusive of separately billable procedures and treating other patients.  Critical care was necessary to treat or prevent imminent or life-threatening deterioration.  Critical care was time spent personally by me on the following activities: development of treatment plan with patient and/or surrogate as well as nursing, discussions with consultants, evaluation of patient's response to treatment, examination of patient, obtaining history from patient or surrogate, ordering and performing treatments and interventions, ordering and review of laboratory studies, ordering and review of radiographic studies, pulse oximetry, re-evaluation of patient's condition and participation in multidisciplinary rounds.  Lynnell Catalan, MD [facility] ICU Physician [facility] Critical Care  Pager: [phone] Mobile: [phone] After hours: [phone].

## 2022-01-03 NOTE — Progress Notes (Signed)
Pt placed back on SBT through vent for rest after first trial on ATC. No distress, SATs 91% on 50%, will place back on ATC in 1-2 hours.    01/03/22 0912  Adult Ventilator Settings  Vent Mode PSV;CPAP  FiO2 (%) 40 %  PEEP 5 cmH20  Pressure Support 10 cmH20  Adult Ventilator Measurements  Peak Airway Pressure 16 L/min  Mean Airway Pressure 7 cmH20  Resp Rate Spontaneous 26 br/min  Resp Rate Total 26 br/min  Measured Ve 12 mL  SpO2 91 %  Spont TV 447 mL

## 2022-01-03 NOTE — Progress Notes (Signed)
[facility] ADULT ICU REPLACEMENT PROTOCOL   The patient does apply for the [facility] Adult ICU Electrolyte Replacment Protocol based on the criteria listed below:   1.Exclusion criteria: TCTS, ECMO, Dialysis, and Myasthenia Gravis patients 2. Is GFR >/= 30 ml/min? Yes.    Patient's GFR today is 58 3. Is SCr </= 2? Yes.   Patient's SCr is 1.14 mg/dL 4. Did SCr increase >/= 0.5 in 24 hours? No. 5.Pt's weight >40kg  Yes.   6. Abnormal electrolyte(s): K+ 3.0  7. Electrolytes replaced per protocol 8.  Call MD STAT for K+ </= 2.5, Phos </= 1, or Mag </= 1 Physician:  n/a  Janet Hensley 01/03/2022 5:32 AM

## 2022-01-04 ENCOUNTER — Inpatient Hospital Stay (HOSPITAL_COMMUNITY): Payer: 59

## 2022-01-04 LAB — BASIC METABOLIC PANEL
Anion gap: 9 (ref 5–15)
Anion gap: 7 (ref 5–15)
BUN: 68 mg/dL — ABNORMAL HIGH (ref 6–20)
BUN: 58 mg/dL — ABNORMAL HIGH (ref 6–20)
CO2: 21 mmol/L — ABNORMAL LOW (ref 22–32)
CO2: 25 mmol/L (ref 22–32)
Calcium: 8.6 mg/dL — ABNORMAL LOW (ref 8.9–10.3)
Calcium: 9.8 mg/dL (ref 8.9–10.3)
Chloride: 109 mmol/L (ref 98–111)
Chloride: 120 mmol/L — ABNORMAL HIGH (ref 98–111)
Creatinine, Ser: 0.85 mg/dL (ref 0.44–1.00)
Creatinine, Ser: 0.73 mg/dL (ref 0.44–1.00)
GFR, Estimated: >60 mL/min (ref >60)
GFR, Estimated: >60 mL/min (ref >60)
Glucose, Bld: 466 mg/dL — ABNORMAL HIGH (ref 70–99)
Glucose, Bld: 128 mg/dL — ABNORMAL HIGH (ref 70–99)
Potassium: 2.8 mmol/L — ABNORMAL LOW (ref 3.5–5.1)
Potassium: 3.9 mmol/L (ref 3.5–5.1)
Sodium: 139 mmol/L (ref 135–145)
Sodium: 152 mmol/L — ABNORMAL HIGH (ref 135–145)

## 2022-01-04 LAB — GLUCOSE, CAPILLARY
Glucose-Capillary: 122 mg/dL — ABNORMAL HIGH (ref 70–99)
Glucose-Capillary: 125 mg/dL — ABNORMAL HIGH (ref 70–99)
Glucose-Capillary: 134 mg/dL — ABNORMAL HIGH (ref 70–99)
Glucose-Capillary: 136 mg/dL — ABNORMAL HIGH (ref 70–99)
Glucose-Capillary: 138 mg/dL — ABNORMAL HIGH (ref 70–99)
Glucose-Capillary: 98 mg/dL (ref 70–99)

## 2022-01-04 LAB — CBC
HCT: 26.9 % — ABNORMAL LOW (ref 36.0–46.0)
Hemoglobin: 8.4 g/dL — ABNORMAL LOW (ref 12.0–15.0)
MCH: 32.4 pg (ref 26.0–34.0)
MCHC: 31.2 g/dL (ref 30.0–36.0)
MCV: 103.9 fL — ABNORMAL HIGH (ref 80.0–100.0)
Platelets: 135 10*3/uL — ABNORMAL LOW (ref 150–400)
RBC: 2.59 MIL/uL — ABNORMAL LOW (ref 3.87–5.11)
RDW: 14.8 % (ref 11.5–15.5)
WBC: 30.5 10*3/uL — ABNORMAL HIGH (ref 4.0–10.5)
nRBC: 0 % (ref 0.0–0.2)

## 2022-01-04 LAB — MAGNESIUM: Magnesium: 1.4 mg/dL — ABNORMAL LOW (ref 1.7–2.4)

## 2022-01-04 MED ORDER — FREE WATER
100.0000 mL | Freq: Four times a day (QID) | Status: DC
  Administered 2022-01-04 – 2022-01-05 (×5): 100 mL

## 2022-01-04 MED ORDER — OXYCODONE HCL 5 MG PO TABS
10.0000 mg | ORAL_TABLET | Freq: Four times a day (QID) | ORAL | Status: DC
  Administered 2022-01-04 – 2022-01-06 (×8): 10 mg
  Filled 2022-01-04 (×8): qty 2

## 2022-01-04 MED ORDER — OXYCODONE HCL 5 MG PO TABS: 7.5000 mg | ORAL_TABLET | Freq: Four times a day (QID) | ORAL | Status: DC | PRN

## 2022-01-04 MED ORDER — MAGNESIUM SULFATE 4 GM/100ML IV SOLN
4.0000 g | Freq: Once | INTRAVENOUS | Status: AC
  Administered 2022-01-04: 4 g via INTRAVENOUS
  Filled 2022-01-04: qty 100

## 2022-01-04 MED ORDER — FREE WATER: 100.0000 mL | Status: DC

## 2022-01-04 MED ORDER — POTASSIUM CHLORIDE 20 MEQ PO PACK
40.0000 meq | PACK | Freq: Once | ORAL | Status: AC
  Administered 2022-01-04: 40 meq
  Filled 2022-01-04: qty 2

## 2022-01-04 MED ORDER — POTASSIUM CHLORIDE 10 MEQ/50ML IV SOLN
10.0000 meq | INTRAVENOUS | Status: AC
  Administered 2022-01-04 (×4): 10 meq via INTRAVENOUS
  Filled 2022-01-04 (×4): qty 50

## 2022-01-04 NOTE — Progress Notes (Signed)
NAME:  Janet Hensley, MRN:  [id number], DOB:  [birth year], LOS: 7 ADMISSION DATE:  12/28/2021, CONSULTATION DATE:  01/04/2022  REFERRING MD:  Willette Cluster, CHIEF COMPLAINT: Severe pneumonia  History of Present Illness:  51 year old smoker presented to [facility] 11/26 with cough, shortness of breath nausea and vomiting, hypoxic to 89%, required intubation for mechanical ventilation. Chest x-ray showed complete whiteout of left lung.  Bronchoscopy showed purulent secretions on the left which were suctioned out. History of snorting crushed oxycodone, use of hot tub Blood cultures showed Rothia Mucilaginosa, urine tested positive for Legionella, bronchoscopy was positive for Pseudomonas and stenotrophomonas .  ID consult was obtained she was treated with cefepime and levofloxacin and nebulized tobramycin .  Course was complicated by AKI and bicarbonate drip was started She was transferred to [facility] for ARDS management and possible need for ECMO  Pertinent  Medical History   Past Medical History:  Diagnosis Date   Asthma 01/15/2012     Significant Hospital Events: Including procedures, antibiotic start and stop dates in addition to other pertinent events   CTA chest -complete opacification of left lung, multiple pulmonary nodules, mediastinal and right hilar lymphadenopathy 12/2 transfer to [facility] 12/8 percutaneous tracheostomy 12/9 tolerating 2 to 4 hours of trach collar trial 12/9 repeat CT scan shows stable consolidation with no evidence of empyema  Interim History / Subjective:  Remains somnolent on minimal sedation.  Did 4 hours of trach collar trial today before tiring. Objective   Blood pressure 107/62, pulse (!) 125, temperature 98 F (36.7 C), temperature source Oral, resp. rate (!) 27, height 5\' 3"  (1.6 m), weight 85.1 kg, last menstrual period 01/16/2016, SpO2 95 %.    Vent Mode: PRVC FiO2 (%):  [40 %] 40 % Set Rate:  [20 bmp] 20 bmp Vt Set:  [420 mL] 420 mL PEEP:  [8 cmH20]  8 cmH20 Pressure Support:  [10 cmH20] 10 cmH20 Plateau Pressure:  [18 cmH20-21 cmH20] 18 cmH20   Intake/Output Summary (Last 24 hours) at 01/04/2022 1714 Last data filed at 01/04/2022 1500 Gross per 24 hour  Intake 4178.68 ml  Output 3130 ml  Net 1048.68 ml    Filed Weights   01/01/22 0400 01/02/22 0400 01/03/22 0500  Weight: 88.9 kg 87.4 kg 85.1 kg    Examination:   Physical exam: General: Crtitically ill-appearing female, HEENT: [place]/AT, eyes anicteric.  Tracheostomy in place with no bleeding at site. Neuro: Sedated, not following commands.  Eyes are closed.  Pupils 4 mm bilateral reactive to light, weak cough and gag present Chest: Clear to auscultation right side, bronchial breath sounds and crackles left side. Heart: Regular rhythm, tachycardic, no murmurs or gallops Abdomen: Soft, nondistended, bowel sounds present Skin: Perioral vesicular rash noted with ulceration on the upper lip  Ancillary tests personally reviewed:  Sodium elevated 139 Hypokalemia 2.8 Creatinine now 0.85 Persistent leukocytosis 30.5  Assessment & Plan:  Critically ill due to acute hypoxic/hypercapnic respiratory failure with ARDS in the setting of polymicrobial pneumonia  -Continue progressive trach collar trials twice daily. -Would be excellent LTAC candidate as anticipate prolonged weaning and significant deconditioning.  Pseudomonas/stenotrophomonas pneumonia Legionella pneumonia Probable aspiration with history of snorting oxycodone COPD, not in exacerbation -Continue ampicillin, Levaquin, minocycline, inhaled tobramycin per discussion with infectious disease -Stop corticosteroids white count should start to improve -Repeat CT shows no drainable collection. -Continue bronchodilators..  Severe sepsis with AKI and respiratory failure due to bacteremia - Rothia mucilaginosa and polymicrobial bacteremia, POA -Septic shock has now resolved  can discontinue arterial line  AKI due to septic ATN  with hypervolemia -now resolved Hypernatremia has resolved -Decrease free water.  Perioral herpes -Continue valacyclovir   Chronic pain on chronic opiates -Transition to enteral medications and wean fentanyl to off.  Prediabetes Obesity -Blood sugars remain at goal of 140-180 -Continue sliding scale insulin -Continue tube feeds  Nonobstructive coronary artery disease  Acute ST elevation MI was ruled out with no wall motion abnormalities on echo and LHC showed nonobstructive coronary artery disease  -No intervention at this time.  Anemia of chronic disease thrombocytopenia  likely 2/2 sepsis -Continue to monitor  Best Practice (right click and "Reselect all SmartList Selections" daily)   Diet/type: tubefeeds  DVT prophylaxis: prophylactic heparin  GI prophylaxis: PPI Lines: Central line Foley:  Yes, and it is still needed Code Status:  full code Last date of multidisciplinary goals of care discussion [12/9 husband updated at bedside]  CRITICAL CARE Performed by: Lynnell Catalan   Total critical care time: 35 minutes  Critical care time was exclusive of separately billable procedures and treating other patients.  Critical care was necessary to treat or prevent imminent or life-threatening deterioration.  Critical care was time spent personally by me on the following activities: development of treatment plan with patient and/or surrogate as well as nursing, discussions with consultants, evaluation of patient's response to treatment, examination of patient, obtaining history from patient or surrogate, ordering and performing treatments and interventions, ordering and review of laboratory studies, ordering and review of radiographic studies, pulse oximetry, re-evaluation of patient's condition and participation in multidisciplinary rounds.  Lynnell Catalan, MD [facility] ICU Physician [facility] [place] Critical Care  Pager: [phone] Mobile: [phone] After hours: [phone].

## 2022-01-04 NOTE — Progress Notes (Signed)
Pt transported to and from CT scan on the ventilator without difficulty.

## 2022-01-05 LAB — BASIC METABOLIC PANEL
Anion gap: 11 (ref 5–15)
BUN: 49 mg/dL — ABNORMAL HIGH (ref 6–20)
CO2: 28 mmol/L (ref 22–32)
Calcium: 10.2 mg/dL (ref 8.9–10.3)
Chloride: 117 mmol/L — ABNORMAL HIGH (ref 98–111)
Creatinine, Ser: 0.68 mg/dL (ref 0.44–1.00)
GFR, Estimated: >60 mL/min (ref >60)
Glucose, Bld: 127 mg/dL — ABNORMAL HIGH (ref 70–99)
Potassium: 3.5 mmol/L (ref 3.5–5.1)
Sodium: 156 mmol/L — ABNORMAL HIGH (ref 135–145)

## 2022-01-05 LAB — CBC WITH DIFFERENTIAL/PLATELET
Abs Immature Granulocytes: 0.35 10*3/uL — ABNORMAL HIGH (ref 0.00–0.07)
Basophils Absolute: 0.1 10*3/uL (ref 0.0–0.1)
Basophils Relative: 0 %
Eosinophils Absolute: 0 10*3/uL (ref 0.0–0.5)
Eosinophils Relative: 0 %
HCT: 28.9 % — ABNORMAL LOW (ref 36.0–46.0)
Hemoglobin: 8.9 g/dL — ABNORMAL LOW (ref 12.0–15.0)
Immature Granulocytes: 1 %
Lymphocytes Relative: 5 %
Lymphs Abs: 1.4 10*3/uL (ref 0.7–4.0)
MCH: 31.6 pg (ref 26.0–34.0)
MCHC: 30.8 g/dL (ref 30.0–36.0)
MCV: 102.5 fL — ABNORMAL HIGH (ref 80.0–100.0)
Monocytes Absolute: 1.2 10*3/uL — ABNORMAL HIGH (ref 0.1–1.0)
Monocytes Relative: 5 %
Neutro Abs: 22.9 10*3/uL — ABNORMAL HIGH (ref 1.7–7.7)
Neutrophils Relative %: 89 %
Platelets: 154 10*3/uL (ref 150–400)
RBC: 2.82 MIL/uL — ABNORMAL LOW (ref 3.87–5.11)
RDW: 14.9 % (ref 11.5–15.5)
WBC: 25.9 10*3/uL — ABNORMAL HIGH (ref 4.0–10.5)
nRBC: 0 % (ref 0.0–0.2)

## 2022-01-05 LAB — GLUCOSE, CAPILLARY
Glucose-Capillary: 103 mg/dL — ABNORMAL HIGH (ref 70–99)
Glucose-Capillary: 109 mg/dL — ABNORMAL HIGH (ref 70–99)
Glucose-Capillary: 110 mg/dL — ABNORMAL HIGH (ref 70–99)
Glucose-Capillary: 111 mg/dL — ABNORMAL HIGH (ref 70–99)
Glucose-Capillary: 121 mg/dL — ABNORMAL HIGH (ref 70–99)
Glucose-Capillary: 122 mg/dL — ABNORMAL HIGH (ref 70–99)
Glucose-Capillary: 127 mg/dL — ABNORMAL HIGH (ref 70–99)

## 2022-01-05 MED ORDER — METOPROLOL TARTRATE 5 MG/5ML IV SOLN
2.5000 mg | Freq: Once | INTRAVENOUS | Status: AC
  Administered 2022-01-05: 2.5 mg via INTRAVENOUS
  Filled 2022-01-05: qty 5

## 2022-01-05 MED ORDER — POTASSIUM CHLORIDE 20 MEQ PO PACK
40.0000 meq | PACK | Freq: Once | ORAL | Status: AC
  Administered 2022-01-05: 40 meq
  Filled 2022-01-05: qty 2

## 2022-01-05 MED ORDER — FREE WATER
200.0000 mL | Freq: Four times a day (QID) | Status: DC
  Administered 2022-01-05: 200 mL

## 2022-01-05 MED ORDER — METOCLOPRAMIDE HCL 5 MG/ML IJ SOLN
10.0000 mg | Freq: Four times a day (QID) | INTRAMUSCULAR | Status: DC | PRN
  Administered 2022-01-05 – 2022-01-06 (×2): 10 mg via INTRAVENOUS
  Filled 2022-01-05 (×2): qty 2

## 2022-01-05 MED ORDER — SODIUM CHLORIDE 0.45 % IV SOLN: INTRAVENOUS | Status: DC

## 2022-01-05 NOTE — Progress Notes (Signed)
NAME:  Janet Hensley, MRN:  [id number], DOB:  Dec 31, 1970, LOS: 8 ADMISSION DATE:  12/28/2021, CONSULTATION DATE:  01/05/2022  REFERRING MD:  Willette Cluster, CHIEF COMPLAINT: Severe pneumonia  History of Present Illness:  51 year old smoker presented to [facility] 11/26 with cough, shortness of breath nausea and vomiting, hypoxic to 89%, required intubation for mechanical ventilation. Chest x-ray showed complete whiteout of left lung.  Bronchoscopy showed purulent secretions on the left which were suctioned out. History of snorting crushed oxycodone, use of hot tub Blood cultures showed Rothia Mucilaginosa, urine tested positive for Legionella, bronchoscopy was positive for Pseudomonas and stenotrophomonas .  ID consult was obtained she was treated with cefepime and levofloxacin and nebulized tobramycin .  Course was complicated by AKI and bicarbonate drip was started She was transferred to [facility] for ARDS management and possible need for ECMO  Pertinent  Medical History   Past Medical History:  Diagnosis Date   Asthma 01/15/2012     Significant Hospital Events: Including procedures, antibiotic start and stop dates in addition to other pertinent events   CTA chest -complete opacification of left lung, multiple pulmonary nodules, mediastinal and right hilar lymphadenopathy 12/2 transfer to [facility] 12/8 percutaneous tracheostomy 12/9 tolerating 2 to 4 hours of trach collar trial 12/9 repeat CT scan shows stable consolidation with no evidence of empyema  Interim History / Subjective:  Remains somnolent on minimal sedation.  Did not trach collar today due to tachycardia. Objective   Blood pressure 110/65, pulse (!) 140, temperature 98.9 F (37.2 C), temperature source Axillary, resp. rate (!) 30, height 5\' 3"  (1.6 m), weight 80.6 kg, last menstrual period 01/16/2016, SpO2 98 %.    Vent Mode: PRVC FiO2 (%):  [40 %] 40 % Set Rate:  [20 bmp] 20 bmp Vt Set:  [420 mL] 420 mL PEEP:  [8 cmH20] 8  cmH20 Pressure Support:  [10 cmH20] 10 cmH20 Plateau Pressure:  [21 cmH20] 21 cmH20   Intake/Output Summary (Last 24 hours) at 01/05/2022 1639 Last data filed at 01/05/2022 1600 Gross per 24 hour  Intake 1978.64 ml  Output 3870 ml  Net -1891.36 ml    Filed Weights   01/02/22 0400 01/03/22 0500 01/05/22 0500  Weight: 87.4 kg 85.1 kg 80.6 kg    Examination:   Physical exam: General: Crtitically ill-appearing female, HEENT: /AT, eyes anicteric.  Tracheostomy in place with no bleeding at site. Neuro: Sedated, not following commands.  Eyes are closed.  Pupils 4 mm bilateral reactive to light, weak cough and gag present Chest: Clear to auscultation right side, bronchial breath sounds and crackles left side. Heart: Regular tachycardic rhythm with P wave activity.  Rhythm, tachycardic, no murmurs or gallops Abdomen: Soft, nondistended, bowel sounds present Skin: Perioral vesicular rash noted with ulceration on the upper lip  Ancillary tests personally reviewed:  Remains elevated 156 Hypokalemia 2.8 Creatinine now 0.85 Proving leukocytosis now 25.9  Assessment & Plan:  Critically ill due to acute hypoxic/hypercapnic respiratory failure with ARDS in the setting of polymicrobial pneumonia  -Continue progressive trach collar trials twice daily. -Would be excellent LTAC candidate as anticipate prolonged weaning and significant deconditioning.  Persistent sinus tachycardia without obvious cause.  Did not worsen or improve with discontinuation of fentanyl.  Suspect she may be still dehydrated given high sodium. -Will start half-normal saline and increase free water. -No response to trial of IV beta-blockade.  Pseudomonas/stenotrophomonas pneumonia Legionella pneumonia Probable aspiration with history of snorting oxycodone COPD, not in exacerbation -Continue ampicillin, Levaquin,  minocycline, inhaled tobramycin per discussion with infectious disease -Stop corticosteroids white count  should start to improve -Repeat CT shows no drainable collection. -Continue bronchodilators..  Severe sepsis with AKI and respiratory failure due to bacteremia - Rothia mucilaginosa and polymicrobial bacteremia, POA -Septic shock has now resolved can discontinue arterial line  AKI due to septic ATN with hypervolemia -now resolved Hypernatremia has resolved -Decrease free water.  Perioral herpes -Continue valacyclovir   Chronic pain on chronic opiates -Transition to enteral medications and wean fentanyl to off.  Prediabetes Obesity -Blood sugars remain at goal of 140-180 -Continue sliding scale insulin -Continue tube feeds  Nonobstructive coronary artery disease  Acute ST elevation MI was ruled out with no wall motion abnormalities on echo and LHC showed nonobstructive coronary artery disease  -No intervention at this time.  Anemia of chronic disease thrombocytopenia  likely 2/2 sepsis -Continue to monitor  Best Practice (right click and "Reselect all SmartList Selections" daily)   Diet/type: tubefeeds  DVT prophylaxis: prophylactic heparin  GI prophylaxis: PPI Lines: Central line Foley:  Yes, and it is still needed Code Status:  full code Last date of multidisciplinary goals of care discussion [12/9 husband updated at bedside]  CRITICAL CARE Performed by: Lynnell Catalan   Total critical care time: 35 minutes  Critical care time was exclusive of separately billable procedures and treating other patients.  Critical care was necessary to treat or prevent imminent or life-threatening deterioration.  Critical care was time spent personally by me on the following activities: development of treatment plan with patient and/or surrogate as well as nursing, discussions with consultants, evaluation of patient's response to treatment, examination of patient, obtaining history from patient or surrogate, ordering and performing treatments and interventions, ordering and review of  laboratory studies, ordering and review of radiographic studies, pulse oximetry, re-evaluation of patient's condition and participation in multidisciplinary rounds.  Lynnell Catalan, MD [facility] ICU Physician [facility] Critical Care  Pager: [phone] Mobile: [phone] After hours: [phone].

## 2022-01-05 NOTE — Progress Notes (Signed)
eLink Physician-Brief Progress Note Patient Name: Janet Hensley DOB: November 29, 1970 MRN: [id number]   Date of Service  01/05/2022  HPI/Events of Note  Notified that pt had vomited around ETT.  Tube feeds held  eICU Interventions  PRN reglan ordered. Qtc 429        [name] 01/05/2022, 6:16 AM

## 2022-01-05 NOTE — Progress Notes (Signed)
[facility] ADULT ICU REPLACEMENT PROTOCOL   The patient does apply for the [facility] Adult ICU Electrolyte Replacment Protocol based on the criteria listed below:   1.Exclusion criteria: TCTS, ECMO, Dialysis, and Myasthenia Gravis patients 2. Is GFR >/= 30 ml/min? Yes.    Patient's GFR today is >60  3. Is SCr </= 2? Yes.   Patient's SCr is 0.68 mg/dL 4. Did SCr increase >/= 0.5 in 24 hours? No. 5.Pt's weight >40kg  Yes.   6. Abnormal electrolyte(s): K+3.5  7. Electrolytes replaced per protocol 8.  Call MD STAT for K+ </= 2.5, Phos </= 1, or Mag </= 1 Physician:  Dr. Riley Nearing, Lilia Argue 01/05/2022 5:53 AM

## 2022-01-06 ENCOUNTER — Inpatient Hospital Stay (HOSPITAL_COMMUNITY): Payer: 59

## 2022-01-06 DIAGNOSIS — J9621 Acute and chronic respiratory failure with hypoxia: Secondary | ICD-10-CM

## 2022-01-06 DIAGNOSIS — Z930 Tracheostomy status: Secondary | ICD-10-CM

## 2022-01-06 DIAGNOSIS — J9610 Chronic respiratory failure, unspecified whether with hypoxia or hypercapnia: Secondary | ICD-10-CM

## 2022-01-06 DIAGNOSIS — E870 Hyperosmolality and hypernatremia: Secondary | ICD-10-CM

## 2022-01-06 LAB — BASIC METABOLIC PANEL
Anion gap: 8 (ref 5–15)
BUN: 39 mg/dL — ABNORMAL HIGH (ref 6–20)
CO2: 25 mmol/L (ref 22–32)
Calcium: 8.9 mg/dL (ref 8.9–10.3)
Chloride: 123 mmol/L — ABNORMAL HIGH (ref 98–111)
Creatinine, Ser: 0.64 mg/dL (ref 0.44–1.00)
GFR, Estimated: >60 mL/min (ref >60)
Glucose, Bld: 111 mg/dL — ABNORMAL HIGH (ref 70–99)
Potassium: 3.2 mmol/L — ABNORMAL LOW (ref 3.5–5.1)
Sodium: 156 mmol/L — ABNORMAL HIGH (ref 135–145)

## 2022-01-06 LAB — BLOOD GAS, VENOUS
Acid-base deficit: 0.6 mmol/L (ref 0.0–2.0)
Bicarbonate: 24.9 mmol/L (ref 20.0–28.0)
O2 Saturation: 31.7 %
Patient temperature: 37
pCO2, Ven: 43 mmHg — ABNORMAL LOW (ref 44–60)
pH, Ven: 7.37 (ref 7.25–7.43)

## 2022-01-06 LAB — GLUCOSE, CAPILLARY
Glucose-Capillary: 148 mg/dL — ABNORMAL HIGH (ref 70–99)
Glucose-Capillary: 121 mg/dL — ABNORMAL HIGH (ref 70–99)
Glucose-Capillary: 94 mg/dL (ref 70–99)
Glucose-Capillary: 119 mg/dL — ABNORMAL HIGH (ref 70–99)
Glucose-Capillary: 104 mg/dL — ABNORMAL HIGH (ref 70–99)
Glucose-Capillary: 145 mg/dL — ABNORMAL HIGH (ref 70–99)

## 2022-01-06 LAB — MAGNESIUM: Magnesium: 1.7 mg/dL (ref 1.7–2.4)

## 2022-01-06 MED ORDER — MIDODRINE HCL 5 MG PO TABS
10.0000 mg | ORAL_TABLET | Freq: Three times a day (TID) | ORAL | Status: DC
  Administered 2022-01-06 – 2022-01-08 (×6): 10 mg
  Filled 2022-01-06 (×6): qty 2

## 2022-01-06 MED ORDER — POTASSIUM CHLORIDE 20 MEQ PO PACK
20.0000 meq | PACK | ORAL | Status: AC
  Administered 2022-01-06 (×2): 20 meq
  Filled 2022-01-06 (×2): qty 1

## 2022-01-06 MED ORDER — OXYCODONE HCL 5 MG PO TABS
7.5000 mg | ORAL_TABLET | Freq: Four times a day (QID) | ORAL | Status: DC
  Administered 2022-01-06 – 2022-01-08 (×8): 7.5 mg
  Filled 2022-01-06 (×8): qty 2

## 2022-01-06 MED ORDER — NOREPINEPHRINE 4 MG/250ML-% IV SOLN
0.0000 ug/min | INTRAVENOUS | Status: DC
  Administered 2022-01-06: 2 ug/min via INTRAVENOUS
  Administered 2022-01-07: 6 ug/min via INTRAVENOUS
  Filled 2022-01-06 (×2): qty 250

## 2022-01-06 MED ORDER — POTASSIUM CHLORIDE 10 MEQ/50ML IV SOLN
10.0000 meq | INTRAVENOUS | Status: AC
  Administered 2022-01-06 (×4): 10 meq via INTRAVENOUS
  Filled 2022-01-06 (×4): qty 50

## 2022-01-06 MED ORDER — ACETAMINOPHEN 500 MG PO TABS
1000.0000 mg | ORAL_TABLET | Freq: Four times a day (QID) | ORAL | Status: DC
  Administered 2022-01-06 – 2022-01-07 (×3): 1000 mg via ORAL
  Filled 2022-01-06 (×3): qty 2

## 2022-01-06 MED ORDER — IPRATROPIUM-ALBUTEROL 0.5-2.5 (3) MG/3ML IN SOLN: 3.0000 mL | RESPIRATORY_TRACT | Status: DC | PRN

## 2022-01-06 MED ORDER — VITAL 1.5 CAL PO LIQD
1000.0000 mL | ORAL | Status: DC
  Administered 2022-01-06 – 2022-01-08 (×3): 1000 mL
  Filled 2022-01-06 (×2): qty 1000

## 2022-01-06 MED ORDER — FREE WATER
200.0000 mL | Status: DC
  Administered 2022-01-06 – 2022-01-07 (×7): 200 mL

## 2022-01-06 MED ORDER — MAGNESIUM SULFATE 2 GM/50ML IV SOLN
2.0000 g | Freq: Once | INTRAVENOUS | Status: AC
  Administered 2022-01-06: 2 g via INTRAVENOUS
  Filled 2022-01-06: qty 50

## 2022-01-06 MED ORDER — POLYETHYLENE GLYCOL 3350 17 G PO PACK: 17.0000 g | PACK | Freq: Every day | ORAL | Status: DC

## 2022-01-06 MED ORDER — METOCLOPRAMIDE HCL 5 MG/ML IJ SOLN
10.0000 mg | Freq: Four times a day (QID) | INTRAMUSCULAR | Status: DC
  Administered 2022-01-06 – 2022-01-08 (×8): 10 mg via INTRAVENOUS
  Filled 2022-01-06 (×8): qty 2

## 2022-01-06 NOTE — Progress Notes (Addendum)
Nutrition Follow-up  DOCUMENTATION CODES:   Obesity unspecified  INTERVENTION:  Once able to resume TF via Cortrak, recommend: Vital 1.5 at 38ml/hr and advance by 84ml q12h to a goal rate of 71ml/hr ( per day) 60 ml ProSource TF20 once daily, each supplement provides 80 kcals and 20 grams protein.  Goal tube feeding regimen provides 1700kcal, 93g protein and free water  NUTRITION DIAGNOSIS:   Inadequate oral intake related to inability to eat as evidenced by NPO status.  Ongoing  GOAL:   Patient will meet greater than or equal to 90% of their needs  Goal unmet-TF held  MONITOR:   TF tolerance  REASON FOR ASSESSMENT:   Consult Enteral/tube feeding initiation and management  ASSESSMENT:   51 y.o. female admits to [facility] related to SOB, nausea/vomiting, requiring mechanical ventilation. Pt transferred to [facility] for ARDS management and possible need for ECMO.  11/29: s/p bronchoscopy findings of legionella and pseudomonas PNA 12/08: tracheostomy; Cortrak placed (tip gastric); rectal tube placed  Not currently tolerating TC trials. Continues on vent support. Plans for LTACH.   TF discontinued yesterday d/t vomiting. Abdomen distended but +BS. MD agrees to restart TF today if tolerates free water without emesis. Spoke with RN, who report pt without emesis today. MD agrees to restart TF.   BUN/Cr improving. Hypernatremia, hypokalemia and no updated phos labs. MD started half NS and increased free water yesterday d/t hypernatremia.  Edema: mild pitting generalized, moderate pitting BUE, mild pitting BLE  Medications: colace, BID, pepcid, banatrol, SSI 2-6 units q4h, protonix, klor-con IV drips: NaCl @ 64ml/hr, levaquin, KCl  Labs: Na 156, potassium 3.2 (repleting),  BUN 39, CBG's 94-122 x24 hours  UOP: x24 hours I/O's: -10.9L since admit  Diet Order:   Diet Order             Diet NPO time specified  Diet effective now                    EDUCATION NEEDS:   Not appropriate for education at this time  Skin:  Skin Assessment: Reviewed RN Assessment  Last BM:  12/10 (type 7-170ml) via rectal tube  Height:   Ht Readings from Last 1 Encounters:  12/31/21 5\' 3"  (1.6 m)    Weight:   Wt Readings from Last 1 Encounters:  01/06/22 81.4 kg    Ideal Body Weight:  52.3 kg  BMI:  Body mass index is 31.79 kg/m.  Estimated Nutritional Needs:   Kcal:  1550-1830 kcals  Protein:  80-95  Fluid:  >/= 1.8 L  14/11/23, RDN, LDN Clinical Nutrition

## 2022-01-06 NOTE — Progress Notes (Signed)
SLP Cancellation Note  Patient Details Name: Saron C Zanders MRN: [id number] DOB: [birth year]   Cancelled PMV assessment: pt did not tolerate TC trials well this am due to tachypnea; back on vent. Will follow along and attempt to coordinate PMV assessment with TC trials. D/W RN.   Riane Rung L. Samson Frederic, MA CCC/SLP Clinical Specialist - Acute Care SLP Acute Rehabilitation Services Office number [phone]          Blenda Mounts Laurice 01/06/2022, 12:01 PM

## 2022-01-06 NOTE — Progress Notes (Addendum)
NAME:  Janet Hensley, MRN:  [id number], DOB:  12-10-1970, LOS: 9 ADMISSION DATE:  12/28/2021, CONSULTATION DATE:  01/06/2022  REFERRING MD:  Willette Cluster, CHIEF COMPLAINT: Severe pneumonia  History of Present Illness:  51 year old smoker presented to [facility] 11/26 with cough, shortness of breath nausea and vomiting, hypoxic to 89%, required intubation for mechanical ventilation. Chest x-ray showed complete whiteout of left lung.  Bronchoscopy showed purulent secretions on the left which were suctioned out. History of snorting crushed oxycodone, use of hot tub Blood cultures showed Rothia Mucilaginosa, urine tested positive for Legionella, bronchoscopy was positive for Pseudomonas and stenotrophomonas .  ID consult was obtained she was treated with cefepime and levofloxacin and nebulized tobramycin .  Course was complicated by AKI and bicarbonate drip was started She was transferred to [facility] for ARDS management and possible need for ECMO  Pertinent  Medical History   Past Medical History:  Diagnosis Date   Asthma 01/15/2012     Significant Hospital Events: Including procedures, antibiotic start and stop dates in addition to other pertinent events   CTA chest -complete opacification of left lung, multiple pulmonary nodules, mediastinal and right hilar lymphadenopathy 12/2 transfer to [facility] 12/8 percutaneous tracheostomy 12/9 tolerating 2 to 4 hours of trach collar trial 12/9 repeat CT scan shows stable consolidation with no evidence of empyema  Interim History / Subjective:  No TCT yesterday due to tachycardia in 140s TF held overnight due to vomiting and some abdominal distention  Today mild tachy 107 Patient alert and weak but able to follow commands On vent  Objective   Blood pressure 107/64, pulse (!) 103, temperature (!) 100.5 F (38.1 C), temperature source Axillary, resp. rate (!) 22, height 5\' 3"  (1.6 m), weight 81.4 kg, last menstrual period 01/16/2016, SpO2 98 %.     Vent Mode: PRVC FiO2 (%):  [40 %] 40 % Set Rate:  [20 bmp] 20 bmp Vt Set:  [420 mL-430 mL] 430 mL PEEP:  [8 cmH20] 8 cmH20 Pressure Support:  [10 cmH20] 10 cmH20 Plateau Pressure:  [19 cmH20-24 cmH20] 19 cmH20   Intake/Output Summary (Last 24 hours) at 01/06/2022 0704 Last data filed at 01/06/2022 0700 Gross per 24 hour  Intake 1621.54 ml  Output 1780 ml  Net -158.46 ml    Filed Weights   01/03/22 0500 01/05/22 0500 01/06/22 0445  Weight: 85.1 kg 80.6 kg 81.4 kg    Examination:  General:  critically ill appearing trached on mech vent HEENT: MM pink/moist; trach in place Neuro: Alert and weak but able to follow commands CV: s1s2, tachy 110s, no m/r/g PULM:  dim clear BS bilaterally, more crackles on LLL; on mech vent  PRVC; CXR showing slightly worsening consolidation vs. Effusion on left, 14/11/23 of Left below  GI: distended, soft, bsx4 active  Extremities: warm/dry, diffuse anasarca Skin: perioral vesicular rash on upper lip  Ancillary tests personally reviewed:  Remains elevated 156 Hypokalemia 2.8 Creatinine now 0.85 Proving leukocytosis now 25.9  Assessment & Plan:  Critically ill due to acute hypoxic/hypercapnic respiratory failure with ARDS in the setting of polymicrobial pneumonia  COPD, not in exacerbation P: -TCT BID as tolerated; rest on vent PRVC at night -Wean PEEP/FiO2 for SpO2 >92% -VAP bundle in place -LTAC placement pending -duoneb prn -pulm toiletry: CPT -trach care per protocol  Severe sepsis with AKI and respiratory failure due to bacteremia - Rothia mucilaginosa and polymicrobial bacteremia, POA Pseudomonas/stenotrophomonas pneumonia Legionella pneumonia Probable aspiration with history of snorting oxycodone P: -off  pressors -ID following; continue levaquin x 7 days last dose 12/14 -remove foley -trend wbc/fever curve  AKI due to septic ATN with hypervolemia -now resolved Hypernatremia Hypokalemia P: -K repleted -increase free water;  consider switching to d5 patient having more vomiting episodes -trend bmp  Vomiting P: -scheduled reglan -continue bowel regimen -consider resuming TF later today  Perioral herpes P: -valtrex  Chronic pain on chronic opiates P: -continue po oxy  Prediabetes Obesity P: -ssi and cbg monitoring -hold TF this am due to vomiting; resume later today  Nonobstructive coronary artery disease  Acute ST elevation MI was ruled out with no wall motion abnormalities on echo and LHC showed nonobstructive coronary artery disease  P: -No intervention at this time.  Anemia of chronic disease thrombocytopenia  likely 2/2 sepsis P: -trend cbc  Best Practice (right click and "Reselect all SmartList Selections" daily)   Diet/type: tubefeeds  DVT prophylaxis: prophylactic heparin  GI prophylaxis: PPI Lines: Central line Foley:  removal ordered  Code Status:  full code Last date of multidisciplinary goals of care discussion [12/11 family  updated at bedside]  CRITICAL CARE Performed by: Lidia Collum   Total critical care time: 35 minutes  JD Anselm Lis  Pulmonary & Critical Care 01/06/2022, 8:09 AM  Please see [email or web address] for pager details.  From 7A-7P if no response, please call [phone]. After hours, please call ELink [phone].

## 2022-01-06 NOTE — Progress Notes (Signed)
[facility] ADULT ICU REPLACEMENT PROTOCOL   The patient does apply for the [facility] Adult ICU Electrolyte Replacment Protocol based on the criteria listed below:   1.Exclusion criteria: TCTS, ECMO, Dialysis, and Myasthenia Gravis patients 2. Is GFR >/= 30 ml/min? Yes.    Patient's GFR today is >60 3. Is SCr </= 2? Yes.   Patient's SCr is 0.64 mg/dL 4. Did SCr increase >/= 0.5 in 24 hours? No. 5.Pt's weight >40kg  Yes.   6. Abnormal electrolyte(s): K+ 3.2, mag 1.7  7. Electrolytes replaced per protocol 8.  Call MD STAT for K+ </= 2.5, Phos </= 1, or Mag </= 1 Physician:  Dr. Ronnald Ramp, Lilia Argue 01/06/2022 7:05 AM

## 2022-01-07 DIAGNOSIS — A419 Sepsis, unspecified organism: Secondary | ICD-10-CM

## 2022-01-07 LAB — GLUCOSE, CAPILLARY
Glucose-Capillary: 138 mg/dL — ABNORMAL HIGH (ref 70–99)
Glucose-Capillary: 143 mg/dL — ABNORMAL HIGH (ref 70–99)
Glucose-Capillary: 159 mg/dL — ABNORMAL HIGH (ref 70–99)
Glucose-Capillary: 193 mg/dL — ABNORMAL HIGH (ref 70–99)
Glucose-Capillary: 93 mg/dL (ref 70–99)
Glucose-Capillary: 98 mg/dL (ref 70–99)

## 2022-01-07 LAB — CBC
HCT: 26.1 % — ABNORMAL LOW (ref 36.0–46.0)
Hemoglobin: 8.1 g/dL — ABNORMAL LOW (ref 12.0–15.0)
MCH: 32.5 pg (ref 26.0–34.0)
MCHC: 31 g/dL (ref 30.0–36.0)
MCV: 104.8 fL — ABNORMAL HIGH (ref 80.0–100.0)
Platelets: 161 10*3/uL (ref 150–400)
RBC: 2.49 MIL/uL — ABNORMAL LOW (ref 3.87–5.11)
RDW: 15.5 % (ref 11.5–15.5)
WBC: 18.3 10*3/uL — ABNORMAL HIGH (ref 4.0–10.5)
nRBC: 0 % (ref 0.0–0.2)

## 2022-01-07 LAB — BASIC METABOLIC PANEL
Anion gap: 4 — ABNORMAL LOW (ref 5–15)
BUN: 33 mg/dL — ABNORMAL HIGH (ref 6–20)
CO2: 25 mmol/L (ref 22–32)
Calcium: 8.7 mg/dL — ABNORMAL LOW (ref 8.9–10.3)
Chloride: 122 mmol/L — ABNORMAL HIGH (ref 98–111)
Creatinine, Ser: 0.47 mg/dL (ref 0.44–1.00)
GFR, Estimated: >60 mL/min (ref >60)
Glucose, Bld: 113 mg/dL — ABNORMAL HIGH (ref 70–99)
Potassium: 3.8 mmol/L (ref 3.5–5.1)
Sodium: 151 mmol/L — ABNORMAL HIGH (ref 135–145)

## 2022-01-07 LAB — MAGNESIUM: Magnesium: 1.9 mg/dL (ref 1.7–2.4)

## 2022-01-07 MED ORDER — FREE WATER
250.0000 mL | Status: DC
  Administered 2022-01-07 – 2022-01-08 (×7): 250 mL

## 2022-01-07 MED ORDER — NOREPINEPHRINE 4 MG/250ML-% IV SOLN
0.0000 ug/min | INTRAVENOUS | Status: DC
  Administered 2022-01-07: 1 ug/min via INTRAVENOUS
  Administered 2022-01-07: 4 ug/min via INTRAVENOUS
  Filled 2022-01-07: qty 250

## 2022-01-07 MED ORDER — POTASSIUM CHLORIDE 20 MEQ PO PACK
20.0000 meq | PACK | Freq: Once | ORAL | Status: AC
  Administered 2022-01-07: 20 meq
  Filled 2022-01-07: qty 1

## 2022-01-07 MED ORDER — ACETAMINOPHEN 160 MG/5ML PO SOLN
650.0000 mg | Freq: Four times a day (QID) | ORAL | Status: DC
  Administered 2022-01-07 – 2022-01-08 (×5): 650 mg via ORAL
  Filled 2022-01-07 (×5): qty 20.3

## 2022-01-07 MED ORDER — POLYETHYLENE GLYCOL 3350 17 G PO PACK
17.0000 g | PACK | Freq: Every day | ORAL | Status: DC | PRN
  Administered 2022-01-07: 17 g via ORAL
  Filled 2022-01-07: qty 1

## 2022-01-07 MED ORDER — MAGNESIUM SULFATE 2 GM/50ML IV SOLN
2.0000 g | Freq: Once | INTRAVENOUS | Status: AC
  Administered 2022-01-07: 2 g via INTRAVENOUS
  Filled 2022-01-07: qty 50

## 2022-01-07 NOTE — Progress Notes (Signed)
Physical Therapy Treatment Patient Details Name: Janet Hensley MRN: [id number] DOB: Dec 24, 1970 Today's Date: 01/07/2022   History of Present Illness Pt is a 51 y.o. female admitted to [facility] 12/22/21 with cough, N/V and SOB with hypoxia requiring intubation; chest CTA with complete opacification of L lung. Workup for ARDS secondary to polymicrobial PNA, pseudomonas/stenotrophomonas PNA, Legionella PNA, probably aspiration with h/o snorting oxycodone. Transfer to [facility] 12/2 for ARDS management and possible ECMO. S/p trach placement 12/7. PMH includes tobacco use, asthma.   PT Comments    Pt slowly progressing with mobility. Today's session focused on first out of bed transfer to recliner, pt requiring maxisky lift, tolerated well. Pt demonstrates improving interaction, command following and BLE muscle activation. Pt remains limited by generalized weakness, decreased activity tolerance, poor balance strategies/postural reactions and impaired cognition. Will continue to follow acutely to address established goals.  SpO2 98% on 10L O2 via trach collar at 40% FiO2 HR 77, seated in recliner BP 133/69 (88)     Recommendations for follow up therapy are one component of a multi-disciplinary discharge planning process, led by the attending physician.  Recommendations may be updated based on patient status, additional functional criteria and insurance authorization.  Follow Up Recommendations  Long-term institutional care without follow-up therapy Can patient physically be transported by private vehicle: No   Assistance Recommended at Discharge Frequent or constant Supervision/Assistance  Patient can return home with the following Two people to help with walking and/or transfers;Two people to help with bathing/dressing/bathroom   Equipment Recommendations  Other (comment) (defer to next venue)    Recommendations for Other Services       Precautions / Restrictions Precautions Precautions: Fall;Other  (comment) Precaution Comments: trach collar, flexiseal Restrictions Weight Bearing Restrictions: No     Mobility  Bed Mobility Overal bed mobility: Needs Assistance Bed Mobility: Rolling Rolling: Total assist, +2 for physical assistance, +2 for safety/equipment         General bed mobility comments: totalA to roll R/L for pericare and maxisky lift pad placement; pt able to assist minimally when hand placed on bedrail    Transfers Overall transfer level: Needs assistance   Transfers: Bed to chair/wheelchair/BSC             General transfer comment: transferred from bed to recliner via maxisky lift, pt tolerated well Transfer via Lift Equipment: [employer]  Ambulation/Gait                   Stairs             Wheelchair Mobility    Modified Rankin (Stroke Patients Only)       Balance Overall balance assessment: Needs assistance   Sitting balance-Leahy Scale: Zero Sitting balance - Comments: poor core activation for anterior weight translation in recliner                                    Cognition Arousal/Alertness: Awake/alert Behavior During Therapy: Flat affect Overall Cognitive Status: Impaired/Different from baseline                                 General Comments: improved alertness though still fatigued; seemingly nodding head yes/no appropriately, following some simple commands well. improved BLE activation, minimal BUE activation noted        Exercises Other Exercises Other Exercises: Improved ability for AROM  L knee flex/ext, ankle DF/PF (unable to move against resistance, expect DF somewhat); limited hip flex/ext strength    General Comments General comments (skin integrity, edema, etc.): pt's husband Janet Hensley) present and supportive. SpO2 98% on 10L O2 via trach collar at 40% FiO2; HR 77, seated in recliner BP 133/69 (88).      Pertinent Vitals/Pain Pain Assessment Pain Assessment: Faces Faces  Pain Scale: Hurts little more Pain Location: back Pain Descriptors / Indicators: Grimacing, Guarding Pain Intervention(s): Monitored during session, Repositioned    Home Living                          Prior Function            PT Goals (current goals can now be found in the care plan section) Progress towards PT goals: Progressing toward goals    Frequency    Min 2X/week      PT Plan Current plan remains appropriate    Co-evaluation              AM-PAC PT "6 Clicks" Mobility   Outcome Measure  Help needed turning from your back to your side while in a flat bed without using bedrails?: Total Help needed moving from lying on your back to sitting on the side of a flat bed without using bedrails?: Total Help needed moving to and from a bed to a chair (including a wheelchair)?: Total Help needed standing up from a chair using your arms (e.g., wheelchair or bedside chair)?: Total Help needed to walk in hospital room?: Total Help needed climbing 3-5 steps with a railing? : Total 6 Click Score: 6    End of Session Equipment Utilized During Treatment: Oxygen Activity Tolerance: Patient tolerated treatment well;Patient limited by fatigue Patient left: in chair;with call bell/phone within reach;with nursing/sitter in room Nurse Communication: Mobility status;Need for lift equipment PT Visit Diagnosis: Other abnormalities of gait and mobility (R26.89);Muscle weakness (generalized) (M62.81)     Time: 2703-5009 PT Time Calculation (min) (ACUTE ONLY): 19 min  Charges:  $Therapeutic Activity: 8-22 mins                     Ina Homes, PT, DPT Acute Rehabilitation Services  Personal: Secure Chat Rehab Office: [phone]  Malachy Chamber 01/07/2022, 3:37 PM

## 2022-01-07 NOTE — Progress Notes (Signed)
eLink Physician-Brief Progress Note Patient Name: Blakleigh C Hickling DOB: 19-Oct-1970 MRN: [id number]   Date of Service  01/07/2022  HPI/Events of Note  Frequent watery stools - Nursing request for Flexiseal.   eICU Interventions  Plan: Place Flexiseal.      Intervention Category Major Interventions: Other:  Kadesia Robel Dennard Nip 01/07/2022, 5:25 AM

## 2022-01-07 NOTE — Progress Notes (Signed)
CPT held for 1400 treatment, pt currently OOB in chair.

## 2022-01-07 NOTE — Progress Notes (Signed)
[facility] ADULT ICU REPLACEMENT PROTOCOL   The patient does apply for the [facility] Adult ICU Electrolyte Replacment Protocol based on the criteria listed below:   1.Exclusion criteria: TCTS, ECMO, Dialysis, and Myasthenia Gravis patients 2. Is GFR >/= 30 ml/min? Yes.    Patient's GFR today is >60 3. Is SCr </= 2? Yes.   Patient's SCr is 0.47 mg/dL 4. Did SCr increase >/= 0.5 in 24 hours? No. 5.Pt's weight >40kg  Yes.   6. Abnormal electrolyte(s): Mag 1.9  7. Electrolytes replaced per protocol 8.  Call MD STAT for K+ </= 2.5, Phos </= 1, or Mag </= 1 Physician:  Dr Althia Forts, Lilia Argue 01/07/2022 6:14 AM

## 2022-01-07 NOTE — Progress Notes (Signed)
NAME:  Janet Hensley, MRN:  [id number], DOB:  1970/08/13, LOS: 10 ADMISSION DATE:  12/28/2021, CONSULTATION DATE:  01/07/2022  REFERRING MD:  Willette Cluster, CHIEF COMPLAINT: Severe pneumonia  History of Present Illness:  51 year old smoker presented to [facility] 11/26 with cough, shortness of breath nausea and vomiting, hypoxic to 89%, required intubation for mechanical ventilation. Chest x-ray showed complete whiteout of left lung.  Bronchoscopy showed purulent secretions on the left which were suctioned out. History of snorting crushed oxycodone, use of hot tub Blood cultures showed Rothia Mucilaginosa, urine tested positive for Legionella, bronchoscopy was positive for Pseudomonas and stenotrophomonas .  ID consult was obtained she was treated with cefepime and levofloxacin and nebulized tobramycin .  Course was complicated by AKI and bicarbonate drip was started She was transferred to [facility] for ARDS management and possible need for ECMO  Pertinent  Medical History   Past Medical History:  Diagnosis Date   Asthma 01/15/2012     Significant Hospital Events: Including procedures, antibiotic start and stop dates in addition to other pertinent events   CTA chest -complete opacification of left lung, multiple pulmonary nodules, mediastinal and right hilar lymphadenopathy 12/2 transfer to [facility] 12/8 percutaneous tracheostomy 12/9 tolerating 2 to 4 hours of trach collar trial 12/9 repeat CT scan shows stable consolidation with no evidence of empyema 12/11 got hypotensive requiring low dose levo; started on midodrine  Interim History / Subjective:  Attempted TCT in am yesterday and lasted 2 hours Go hypotensive late yesterday requiring low dose levo; started on midodrine  Currently on vent PRVC Patient alert moves lower extremities, weak UE  Objective   Blood pressure (!) 142/60, pulse 66, temperature 98.8 F (37.1 C), temperature source Axillary, resp. rate 19, height 5\' 3"  (1.6 m),  weight 81.9 kg, last menstrual period 01/16/2016, SpO2 100 %.    Vent Mode: PRVC FiO2 (%):  [40 %] 40 % Set Rate:  [20 bmp] 20 bmp Vt Set:  [420 mL] 420 mL PEEP:  [5 cmH20] 5 cmH20 Plateau Pressure:  [19 cmH20-20 cmH20] 19 cmH20   Intake/Output Summary (Last 24 hours) at 01/07/2022 14/12/2021 Last data filed at 01/07/2022 0600 Gross per 24 hour  Intake 1283.72 ml  Output 835 ml  Net 448.72 ml    Filed Weights   01/05/22 0500 01/06/22 0445 01/07/22 0335  Weight: 80.6 kg 81.4 kg 81.9 kg    Examination:  General:  critically ill appearing trached on mech vent HEENT: MM pink/moist; trach in place Neuro: Alert and weak but able to follow commands, stronger on LE than UE CV: s1s2, RRR, no m/r/g PULM:  dim clear BS bilaterally, more crackles on LLL; on mech vent  PRVC GI: distended, soft, bsx4 active  Extremities: warm/dry, diffuse anasarca  Ancillary tests personally reviewed:  Remains elevated 156 Hypokalemia 2.8 Creatinine now 0.85 Proving leukocytosis now 25.9  Assessment & Plan:  Critically ill due to acute hypoxic/hypercapnic respiratory failure with ARDS in the setting of polymicrobial pneumonia  COPD, not in exacerbation P: -cont TCT vs. PSV trial BID as tolerated; rest on vent PRVC at night and prn -Wean PEEP/FiO2 for SpO2 >92% -VAP bundle in place -LTAC placement pending -duoneb prn -pulm toiletry: CPT -trach care per protocol  Severe sepsis with AKI and respiratory failure due to bacteremia - Rothia mucilaginosa and polymicrobial bacteremia, POA Pseudomonas/stenotrophomonas pneumonia Legionella pneumonia Probable aspiration with history of snorting oxycodone P: -cont levo for MAP goal >65 -midodrine added yesterday -ID following; continue levaquin x  7 days last dose 12/14 -trend wbc/fever curve  AKI due to septic ATN with hypervolemia -now resolved Hypernatremia Hypokalemia Hypomagnesemia P: -K/mag repleted -increased free water -trend  bmp  Vomiting: on 12/10 Diarrhea P: -scheduled reglan -decrease dose bowel regimen -continue TF -continue flexiseal  Perioral herpes P: -completed valtrex 12/11  Chronic pain on chronic opiates P: -decrease dose of po oxy yesterday -scheduled tylenol  Prediabetes Obesity P: -ssi and cbg monitoring  Nonobstructive coronary artery disease  Acute ST elevation MI was ruled out with no wall motion abnormalities on echo and LHC showed nonobstructive coronary artery disease  P: -No intervention at this time.  Anemia of chronic disease thrombocytopenia  likely 2/2 sepsis P: -trend cbc  Best Practice (right click and "Reselect all SmartList Selections" daily)   Diet/type: tubefeeds  DVT prophylaxis: prophylactic heparin  GI prophylaxis: PPI Lines: Central line Foley:  removal ordered  Code Status:  full code Last date of multidisciplinary goals of care discussion [12/11 family  updated at bedside]  CRITICAL CARE Performed by: Mick Sell   Total critical care time: 35 minutes  JD Rexene Agent [place] Pulmonary & Critical Care 01/07/2022, 6:59 AM  Please see [email or web address] for pager details.  From 7A-7P if no response, please call [phone]. After hours, please call ELink [phone].

## 2022-01-07 NOTE — TOC Progression Note (Addendum)
Transition of Care [facility]) - Progression Note    Patient Details  Name: Janet Hensley MRN: [id number] Date of Birth: [birth year]  [facility] CM/SW Contact  Graves-Bigelow, Lamar Laundry, RN Phone Number: 01/07/2022, 2:29 PM  Clinical Narrative:  Case Manger received an update from [employer] that insurance denied LTAC. Insurance is asking for a P2P-MD to call [phone] opt 3. MD is aware. Case Manager will continue to follow for additional transition of care needs.   [id number] 01-07-22 P2P has been approved for this patient for LTAC. Case Manager will continue to follow for transition of care needs.   Expected Discharge Plan:  (TBD) Barriers to Discharge: Continued Medical Work up  Expected Discharge Plan and Services Expected Discharge Plan:  (TBD)       Living arrangements for the past 2 months: Single Family Home    Readmission Risk Interventions     No data to display

## 2022-01-07 NOTE — Progress Notes (Signed)
RT NOTE: patient placed on 35% ATC.  Currently tolerating well.  Will continue to monitor.  

## 2022-01-08 ENCOUNTER — Inpatient Hospital Stay
Admission: AD | Admit: 2022-01-08 | Discharge: 2022-02-06 | Disposition: A | Discharge status: Rehabilitation Facility | Payer: 59 | Source: Other Acute Inpatient Hospital

## 2022-01-08 ENCOUNTER — Inpatient Hospital Stay (HOSPITAL_COMMUNITY): Payer: 59

## 2022-01-08 ENCOUNTER — Other Ambulatory Visit (HOSPITAL_COMMUNITY): Payer: Self-pay

## 2022-01-08 DIAGNOSIS — J9621 Acute and chronic respiratory failure with hypoxia: Secondary | ICD-10-CM | POA: Diagnosis present

## 2022-01-08 DIAGNOSIS — G894 Chronic pain syndrome: Secondary | ICD-10-CM | POA: Insufficient documentation

## 2022-01-08 DIAGNOSIS — J80 Acute respiratory distress syndrome: Secondary | ICD-10-CM | POA: Diagnosis present

## 2022-01-08 DIAGNOSIS — J151 Pneumonia due to Pseudomonas: Secondary | ICD-10-CM | POA: Diagnosis present

## 2022-01-08 DIAGNOSIS — Z930 Tracheostomy status: Secondary | ICD-10-CM

## 2022-01-08 LAB — CBC
HCT: 25 % — ABNORMAL LOW (ref 36.0–46.0)
Hemoglobin: 7.9 g/dL — ABNORMAL LOW (ref 12.0–15.0)
MCH: 32.2 pg (ref 26.0–34.0)
MCHC: 31.6 g/dL (ref 30.0–36.0)
MCV: 102 fL — ABNORMAL HIGH (ref 80.0–100.0)
Platelets: 144 10*3/uL — ABNORMAL LOW (ref 150–400)
RBC: 2.45 MIL/uL — ABNORMAL LOW (ref 3.87–5.11)
RDW: 15.4 % (ref 11.5–15.5)
WBC: 13.5 10*3/uL — ABNORMAL HIGH (ref 4.0–10.5)
nRBC: 0 % (ref 0.0–0.2)

## 2022-01-08 LAB — CULTURE, BAL-QUANTITATIVE W GRAM STAIN
Culture: >=100000 — AB
Gram Stain: NONE SEEN
Special Requests: NORMAL

## 2022-01-08 LAB — BASIC METABOLIC PANEL
Anion gap: 6 (ref 5–15)
BUN: 23 mg/dL — ABNORMAL HIGH (ref 6–20)
CO2: 23 mmol/L (ref 22–32)
Calcium: 8.4 mg/dL — ABNORMAL LOW (ref 8.9–10.3)
Chloride: 116 mmol/L — ABNORMAL HIGH (ref 98–111)
Creatinine, Ser: 0.4 mg/dL — ABNORMAL LOW (ref 0.44–1.00)
GFR, Estimated: >60 mL/min (ref >60)
Glucose, Bld: 121 mg/dL — ABNORMAL HIGH (ref 70–99)
Potassium: 3.6 mmol/L (ref 3.5–5.1)
Sodium: 145 mmol/L (ref 135–145)

## 2022-01-08 LAB — GLUCOSE, CAPILLARY
Glucose-Capillary: 111 mg/dL — ABNORMAL HIGH (ref 70–99)
Glucose-Capillary: 112 mg/dL — ABNORMAL HIGH (ref 70–99)
Glucose-Capillary: 132 mg/dL — ABNORMAL HIGH (ref 70–99)
Glucose-Capillary: 159 mg/dL — ABNORMAL HIGH (ref 70–99)

## 2022-01-08 MED ORDER — MIDODRINE HCL 10 MG PO TABS: 10.0000 mg | ORAL_TABLET | Freq: Three times a day (TID) | ORAL | Status: DC

## 2022-01-08 MED ORDER — BANATROL TF EN LIQD: 60.0000 mL | Freq: Two times a day (BID) | ENTERAL | Status: DC

## 2022-01-08 MED ORDER — POTASSIUM CHLORIDE 20 MEQ PO PACK
40.0000 meq | PACK | Freq: Once | ORAL | Status: AC
  Administered 2022-01-08: 40 meq
  Filled 2022-01-08: qty 2

## 2022-01-08 MED ORDER — CLONAZEPAM 0.25 MG PO TBDP: 0.2500 mg | ORAL_TABLET | Freq: Every day | ORAL | 0 refills | Status: DC

## 2022-01-08 MED ORDER — OXYCODONE HCL 7.5 MG PO TABS: 5.0000 mg | ORAL_TABLET | Freq: Four times a day (QID) | ORAL | 0 refills | Status: DC

## 2022-01-08 MED ORDER — CLONAZEPAM 0.25 MG PO TBDP: 0.2500 mg | ORAL_TABLET | Freq: Every day | ORAL | Status: DC

## 2022-01-08 MED ORDER — ACETAMINOPHEN 160 MG/5ML PO SOLN: 650.0000 mg | Freq: Four times a day (QID) | ORAL | 0 refills | Status: DC

## 2022-01-08 MED ORDER — FREE WATER: 250.0000 mL | Status: DC

## 2022-01-08 MED ORDER — INSULIN ASPART 100 UNIT/ML IJ SOLN: 2.0000 [IU] | INTRAMUSCULAR | 11 refills | Status: DC

## 2022-01-08 MED ORDER — HEPARIN SODIUM (PORCINE) 5000 UNIT/ML IJ SOLN: 5000.0000 [IU] | Freq: Three times a day (TID) | INTRAMUSCULAR | Status: DC

## 2022-01-08 MED ORDER — IPRATROPIUM-ALBUTEROL 0.5-2.5 (3) MG/3ML IN SOLN: 3.0000 mL | RESPIRATORY_TRACT | Status: AC | PRN

## 2022-01-08 MED ORDER — LEVOFLOXACIN IN D5W 750 MG/150ML IV SOLN: 750.0000 mg | INTRAVENOUS | Status: DC

## 2022-01-08 NOTE — Progress Notes (Signed)
NAME:  Janet Hensley, MRN:  [id number], DOB:  October 22, 1970, LOS: 11 ADMISSION DATE:  12/28/2021, CONSULTATION DATE:  01/08/2022  REFERRING MD:  Willette Cluster, CHIEF COMPLAINT: Severe pneumonia  History of Present Illness:  51 year old smoker presented to [facility] 11/26 with cough, shortness of breath nausea and vomiting, hypoxic to 89%, required intubation for mechanical ventilation. Chest x-ray showed complete whiteout of left lung.  Bronchoscopy showed purulent secretions on the left which were suctioned out. History of snorting crushed oxycodone, use of hot tub Blood cultures showed Rothia Mucilaginosa, urine tested positive for Legionella, bronchoscopy was positive for Pseudomonas and stenotrophomonas .  ID consult was obtained she was treated with cefepime and levofloxacin and nebulized tobramycin .  Course was complicated by AKI and bicarbonate drip was started She was transferred to [facility] for ARDS management and possible need for ECMO  Pertinent  Medical History   Past Medical History:  Diagnosis Date   Asthma 01/15/2012     Significant Hospital Events: Including procedures, antibiotic start and stop dates in addition to other pertinent events   CTA chest -complete opacification of left lung, multiple pulmonary nodules, mediastinal and right hilar lymphadenopathy 12/2 transfer to [facility] 12/8 percutaneous tracheostomy 12/9 tolerating 2 to 4 hours of trach collar trial 12/9 repeat CT scan shows stable consolidation with no evidence of empyema 12/11 got hypotensive requiring low dose levo; started on midodrine 12/12 did about 8 hours TCT; still on low dose levo 12/13 off levo overnight and today  Interim History / Subjective:  Did about 8 hours TCT yesterday; rested on vent overnight Off levo overnight and this am  Objective   Blood pressure (!) 112/55, pulse (!) 101, temperature 98.4 F (36.9 C), temperature source Axillary, resp. rate (!) 25, height 5\' 3"  (1.6 m), weight 83.3  kg, last menstrual period 01/16/2016, SpO2 100 %.    Vent Mode: PRVC FiO2 (%):  [35 %-40 %] 40 % Set Rate:  [20 bmp] 20 bmp Vt Set:  [420 mL] 420 mL PEEP:  [5 cmH20] 5 cmH20 Plateau Pressure:  [15 cmH20-20 cmH20] 20 cmH20   Intake/Output Summary (Last 24 hours) at 01/08/2022 01/10/2022 Last data filed at 01/08/2022 0300 Gross per 24 hour  Intake 1185.39 ml  Output 1050 ml  Net 135.39 ml    Filed Weights   01/06/22 0445 01/07/22 0335 01/08/22 0238  Weight: 81.4 kg 81.9 kg 83.3 kg    Examination:  General:  chronically ill appearing trached on mech vent HEENT: MM pink/moist; trach in place Neuro: Alert and weak but able to follow commands CV: s1s2, sinus tachy 100s, no m/r/g PULM:  dim clear BS bilaterally, more crackles on LLL; on mech vent  PRVC GI: distended, soft, bsx4 active  Extremities: warm/dry, diffuse anasarca  Ancillary tests personally reviewed:  Remains elevated 156 Hypokalemia 2.8 Creatinine now 0.85 Proving leukocytosis now 25.9  Assessment & Plan:  Critically ill due to acute hypoxic/hypercapnic respiratory failure with ARDS in the setting of polymicrobial pneumonia  COPD, not in exacerbation P: -cont TCT during day as tolerated and rest on vent PRVC at night and prn -Wean PEEP/FiO2 for SpO2 >92% -VAP bundle in place -awaiting LTAC placement -duoneb prn -pulm toiletry: CPT -trach care per protocol  Severe sepsis with AKI and respiratory failure due to bacteremia - Rothia mucilaginosa and polymicrobial bacteremia, POA Pseudomonas/stenotrophomonas pneumonia Legionella pneumonia Probable aspiration with history of snorting oxycodone P: -off levo; MAP goal >65 -cont midodrine -ID following; continue levaquin x 7 days  last dose 12/14 -trend wbc/fever curve  AKI due to septic ATN with hypervolemia -now resolved Hypernatremia: improved Hypokalemia Hypomagnesemia P: -na improving; continue free water -K/mag repleted this am; trend bmp/mag -Trend BMP  / urinary output -Replace electrolytes as indicated -Avoid nephrotoxic agents, ensure adequate renal perfusion  Vomiting: on 12/10; improved Diarrhea P: -cont reglan -bowel regimen changed prn -continue TF -continue flexiseal  Perioral herpes P: -completed valtrex 12/11  Chronic pain on chronic opiates P: -decrease dose of po oxy yesterday -scheduled tylenol  Prediabetes Obesity P: -ssi and cbg monitoring  Nonobstructive coronary artery disease  Acute ST elevation MI was ruled out with no wall motion abnormalities on echo and LHC showed nonobstructive coronary artery disease  P: -No intervention at this time.  Anemia of chronic disease thrombocytopenia  likely 2/2 sepsis P: -trend cbc  Best Practice (right click and "Reselect all SmartList Selections" daily)   Diet/type: tubefeeds  DVT prophylaxis: prophylactic heparin  GI prophylaxis: PPI Lines: Central line Foley:  removal ordered  Code Status:  full code Last date of multidisciplinary goals of care discussion [12/12 husband updated at bedside]  CRITICAL CARE Performed by: Dorothe Pea [facility] 01/08/2022, 7:02 AM  Please see [email or web address] for pager details.  From 7A-7P if no response, please call [phone]. After hours, please call ELink [phone].

## 2022-01-08 NOTE — Discharge Summary (Signed)
Physician Discharge Summary         Patient ID: Janet Hensley MRN: [id number] DOB/AGE: 51-Aug-1972 51 y.o.  Admit date: 12/28/2021 Discharge date: 01/08/2022  Discharge Diagnoses:    Active Hospital Problems   Diagnosis Date Noted   ARDS (adult respiratory distress syndrome) ([place]) 12/28/2021   Septic shock ([place]) 01/07/2022   Chronic respiratory failure ([place]) 01/06/2022   Tracheostomy in place [facility]) 01/06/2022   Hypernatremia 01/06/2022   AKI (acute kidney injury) ([place]) 12/27/2021    Resolved Hospital Problems  No resolved problems to display.      Discharge summary   51 year old smoker presented to [facility] 11/26 with cough, shortness of breath nausea and vomiting, hypoxic to 89%, required intubation for mechanical ventilation.  Chest x-ray showed complete whiteout of left lung.  Bronchoscopy showed purulent secretions on the left which were suctioned out. History of snorting crushed oxycodone, use of hot tub Blood cultures showed Rothia Mucilaginosa, urine tested positive for Legionella, bronchoscopy was positive for Pseudomonas and stenotrophomonas . ID consult was obtained she was treated with cefepime and levofloxacin and nebulized tobramycin . Course was complicated by AKI and bicarbonate drip was started. She was transferred to [facility] on 12/2 for ARDS management and possible need for ECMO. Patient did receive ECMO. Unable to wean of vent and tracheostomy placed 12/8. Since then has been a slow wean to trach collar trials. Was doing about 2 hours BID trach collar trials and would rest on vent. Occasionally had soft BP and required low dose levo. Was started on midodrine for soft BP and off levo. Transferring to [facility] 12/13.    Discharge Plan by Active Problems    Acute on chronic hypoxic/hypercapnic respiratory failure with ARDS in the setting of polymicrobial pneumonia  COPD, not in exacerbation Tracheostomy in place Plan -transfer to [place]  12/13 -weaning trial per [facility] -trach care per protocol -pulm toiletry -duoneb prn  Rothia mucilaginosa and polymicrobial bacteremia, POA Pseudomonas/stenotrophomonas pneumonia Legionella pneumonia Probable aspiration with history of snorting oxycodone P: -per ID continue Levaquin x 7 days last dose 12/14  Hypotension P: -continue midodrine  AKI due to septic ATN with hypervolemia -now resolved Hypernatremia: improved Hypokalemia Hypomagnesemia P: -continue free 250 q4 -trend bmp/mag and replete electrolytes as needed  Diarrhea Plan -hold bowel regimen and reglan  Perioral herpes Plan -completed valtrex 12/11  Chronic pain on chronic opiates Plan -scheduled tylenol -will likely need low dose scheduled oxy; was on 7.5 mg q6  Prediabetes Plan -ssi and cbg monitoring per [facility] protocol  Nonobstructive coronary artery disease  Acute ST elevation MI was ruled out with no wall motion abnormalities on echo and LHC showed nonobstructive coronary artery disease  Plan -No intervention at this time   Anemia of chronic disease thrombocytopenia  likely 2/2 sepsis Plan -trend [facility] tests/ studies   Procedures   Heart cath 11/28 Tracheostomy 12/8 Culture data/antimicrobials   Bcx2: Rothia mucilaginosa and polymicrobial   Resp culture: Pseudomonas/stenotrophomonas, Legionella    Consults      Discharge Exam: BP 123/68 (BP Location: Left Arm)   Pulse (!) 102   Temp 98.6 F (37 C) (Oral)   Resp (!) 26   Ht _0  (1.6 m)   Wt 83.3 kg   LMP 01/16/2016   SpO2 100%   BMI 32.53 kg/m   General:  chronically ill appearing trached on mech vent HEENT: MM pink/moist; trach in place Neuro: Alert and weak but able  to follow commands CV: s1s2, sinus tachy 100s, no m/r/g PULM:  dim clear BS bilaterally, more crackles on LLL; on mech vent  PRVC GI: distended, soft, bsx4 active  Extremities: warm/dry, diffuse anasarca  Labs at  discharge   Lab Results  Component Value Date   CREATININE 0.40 (L) 01/08/2022   BUN 23 (H) 01/08/2022   NA 145 01/08/2022   K 3.6 01/08/2022   CL 116 (H) 01/08/2022   CO2 23 01/08/2022   Lab Results  Component Value Date   WBC 13.5 (H) 01/08/2022   HGB 7.9 (L) 01/08/2022   HCT 25.0 (L) 01/08/2022   MCV 102.0 (H) 01/08/2022   PLT 144 (L) 01/08/2022   Lab Results  Component Value Date   ALT 61 (H) 01/01/2022   AST 46 (H) 01/01/2022   ALKPHOS 89 01/01/2022   BILITOT 0.7 01/01/2022   No results found for: "INR", "PROTIME"  Current radiological studies    No results found.  Disposition:  [facility]   There are no questions and answers to display.          Allergies as of 01/08/2022   No Known Allergies      Medication List     STOP taking these medications    acetaminophen 325 MG tablet Commonly known as: TYLENOL Replaced by: acetaminophen 160 MG/5ML solution   acetaminophen 650 MG suppository Commonly known as: TYLENOL   benzonatate 100 MG capsule Commonly known as: TESSALON   budesonide 0.25 MG/2ML nebulizer solution Commonly known as: PULMICORT   ceFEPIme 2 g in sodium chloride 0.9 % 100 mL   Chlorhexidine Gluconate Cloth 2 % Pads   docusate 50 MG/5ML liquid Commonly known as: COLACE   feeding supplement (PROSource TF20) liquid   feeding supplement (VITAL AF 1.2 CAL) Liqd   fentaNYL 10 mcg/ml Soln infusion   fentaNYL Soln Commonly known as: SUBLIMAZE   LORazepam 0.5 MG tablet Commonly known as: ATIVAN   methylPREDNISolone sodium succinate 40 mg/mL injection Commonly known as: SOLU-MEDROL   midazolam 2 MG/2ML Soln injection Commonly known as: VERSED   midazolam-sodium chloride 100-0.9 MG/100ML-% Soln   minocycline 100 MG capsule Commonly known as: MINOCIN   Narcan 4 MG/0.1ML Liqd nasal spray kit Generic drug: naloxone   norepinephrine 4-5 MG/250ML-% Soln Commonly known as: LEVOPHED   oxyCODONE-acetaminophen 10-325  MG tablet Commonly known as: PERCOCET   polyethylene glycol 17 g packet Commonly known as: MIRALAX / GLYCOLAX   sodium bicarbonate 75 mEq in sodium chloride 0.45 % 1,000 mL   sodium chloride 0.9 % infusion   sodium chloride flush 0.9 % Soln Commonly known as: NS   tobramycin (PF) 300 MG/5ML nebulizer solution Commonly known as: TOBI   valACYclovir 1000 MG tablet Commonly known as: VALTREX       TAKE these medications    acetaminophen 160 MG/5ML solution Commonly known as: TYLENOL Take 20.3 mLs (650 mg total) by mouth every 6 (six) hours. Replaces: acetaminophen 325 MG tablet   cyclobenzaprine 10 MG tablet Commonly known as: FLEXERIL Take 10 mg by mouth 3 (three) times daily as needed for muscle spasms.   famotidine 20 MG tablet Commonly known as: PEPCID Place 1 tablet (20 mg total) into feeding tube 2 (two) times daily.   fiber supplement (BANATROL TF) liquid Place 60 mLs into feeding tube 2 (two) times daily.   free water Soln Place 250 mLs into feeding tube every 4 (four) hours.   heparin 5000 UNIT/ML injection Inject 1 mL (5,000  Units total) into the skin every 8 (eight) hours.   insulin aspart 100 UNIT/ML injection Commonly known as: novoLOG Inject 2-6 Units into the skin every 4 (four) hours. What changed: how much to take   ipratropium-albuterol 0.5-2.5 (3) MG/3ML Soln Commonly known as: DUONEB Take 3 mLs by nebulization every 4 (four) hours as needed. What changed:  when to take this Another medication with the same name was removed. Continue taking this medication, and follow the directions you see here.   levofloxacin 750 MG/150ML Soln Commonly known as: LEVAQUIN Inject 150 mLs (750 mg total) into the vein daily. Start taking on: January 09, 2022 What changed: when to take this   midodrine 10 MG tablet Commonly known as: PROAMATINE Place 1 tablet (10 mg total) into feeding tube every 8 (eight) hours.         Follow-up appointment    [facility] Discharge Condition:    stable  Physician Statement:   The Patient was personally examined, the discharge assessment and plan has been personally reviewed and I agree with PA-C Niani Mourer's assessment and plan. 35 minutes of time have been dedicated to discharge assessment, planning and discharge instructions.   Signed: Mick Sell 01/08/2022, 10:30 AM

## 2022-01-08 NOTE — Procedures (Deleted)
Bronchoscopy Procedure Note  Jasmene C Renk  [id number]  Jul 15, 1970  Date:01/08/22  Time:11:33 AM   Provider Performing:Johnel Yielding   Procedure(s):  Flexible bronchoscopy with bronchial alveolar lavage (31517) and Subsequent Therapeutic Aspiration of Tracheobronchial Tree (61607)  Indication(s) Acute respiratory failure with mucus plugging  Consent Risks of the procedure as well as the alternatives and risks of each were explained to the patient and/or caregiver.  Consent for the procedure was obtained and is signed in the bedside chart  Anesthesia Etomidate and Rocuronium   Time Out Verified patient identification, verified procedure, site/side was marked, verified correct patient position, special equipment/implants available, medications/allergies/relevant history reviewed, required imaging and test results available.   Sterile Technique Usual hand hygiene, masks, gowns, and gloves were used   Procedure Description Bronchoscope advanced through endotracheal tube and into airway.  Airways were examined down to subsegmental level with findings noted below.   Following diagnostic evaluation, BAL(s) performed in RLL with normal saline and return of Blood clotts fluid and Therapeutic aspiration performed in RML/RLL  Findings: Tenacious amounts of thick bloody secretions noted throughout the airway especially in RML/RLL   Complications/Tolerance None; patient tolerated the procedure well. Chest X-ray is needed post procedure.   EBL Minimal   Specimen(s) BAL

## 2022-01-08 NOTE — Progress Notes (Signed)
[facility] ADULT ICU REPLACEMENT PROTOCOL   The patient does apply for the [facility] Adult ICU Electrolyte Replacment Protocol based on the criteria listed below:   1.Exclusion criteria: TCTS, ECMO, Dialysis, and Myasthenia Gravis patients 2. Is GFR >/= 30 ml/min? Yes.    Patient's GFR today is >60 3. Is SCr </= 2? Yes.   Patient's SCr is 0.40 mg/dL 4. Did SCr increase >/= 0.5 in 24 hours? No. 5.Pt's weight >40kg  Yes.   6. Abnormal electrolyte(s): K+ 3.6  7. Electrolytes replaced per protocol 8.  Call MD STAT for K+ </= 2.5, Phos </= 1, or Mag </= 1 Physician:  n/a  Melvern Banker 01/08/2022 6:15 AM

## 2022-01-08 NOTE — TOC Transition Note (Signed)
Transition of Care [facility]) - CM/SW Discharge Note   Patient Details  Name: Janet Hensley MRN: [id number] Date of Birth: [birth year]  Transition of Care [facility]) CM/SW Contact:  Gala Lewandowsky, RN Phone Number: 01/08/2022, 11:39 AM   Clinical Narrative:  Patient has been approved for [facility] at [facility]. Patient will transition to room 5E25 receiving MD is Luna Kitchens. RN to call report to [phone] 30 minutes prior to transfer. Room will be ready in about an hour. No further needs identified from Case Manager at this time.     Final next level of care: Long Term Acute Care (LTAC) Barriers to Discharge: No Barriers Identified   Patient Goals and CMS Choice Patient states their goals for this hospitalization and ongoing recovery are:: Plan for LTAC CMS Medicare.gov Compare Post Acute Care list provided to:: Patient Choice offered to / list presented to : Patient, Adult Children, Spouse  Discharge Plan and Services     Post Acute Care Choice: Long Term Acute Care (LTAC)            Readmission Risk Interventions     No data to display

## 2022-01-09 DIAGNOSIS — J151 Pneumonia due to Pseudomonas: Secondary | ICD-10-CM

## 2022-01-09 DIAGNOSIS — D649 Anemia, unspecified: Secondary | ICD-10-CM | POA: Diagnosis not present

## 2022-01-09 DIAGNOSIS — J80 Acute respiratory distress syndrome: Secondary | ICD-10-CM

## 2022-01-09 DIAGNOSIS — R1310 Dysphagia, unspecified: Secondary | ICD-10-CM | POA: Diagnosis not present

## 2022-01-09 DIAGNOSIS — D72829 Elevated white blood cell count, unspecified: Secondary | ICD-10-CM | POA: Diagnosis not present

## 2022-01-09 DIAGNOSIS — J189 Pneumonia, unspecified organism: Secondary | ICD-10-CM | POA: Diagnosis not present

## 2022-01-09 DIAGNOSIS — J9600 Acute respiratory failure, unspecified whether with hypoxia or hypercapnia: Secondary | ICD-10-CM | POA: Diagnosis not present

## 2022-01-09 DIAGNOSIS — Z930 Tracheostomy status: Secondary | ICD-10-CM

## 2022-01-09 DIAGNOSIS — J449 Chronic obstructive pulmonary disease, unspecified: Secondary | ICD-10-CM | POA: Diagnosis not present

## 2022-01-09 DIAGNOSIS — E46 Unspecified protein-calorie malnutrition: Secondary | ICD-10-CM | POA: Diagnosis not present

## 2022-01-09 DIAGNOSIS — J9621 Acute and chronic respiratory failure with hypoxia: Secondary | ICD-10-CM

## 2022-01-09 DIAGNOSIS — G894 Chronic pain syndrome: Secondary | ICD-10-CM | POA: Insufficient documentation

## 2022-01-09 LAB — CBC
HCT: 24.8 % — ABNORMAL LOW (ref 36.0–46.0)
Hemoglobin: 8 g/dL — ABNORMAL LOW (ref 12.0–15.0)
MCH: 32.7 pg (ref 26.0–34.0)
MCHC: 32.3 g/dL (ref 30.0–36.0)
MCV: 101.2 fL — ABNORMAL HIGH (ref 80.0–100.0)
Platelets: 156 10*3/uL (ref 150–400)
RBC: 2.45 MIL/uL — ABNORMAL LOW (ref 3.87–5.11)
RDW: 15.9 % — ABNORMAL HIGH (ref 11.5–15.5)
WBC: 11 10*3/uL — ABNORMAL HIGH (ref 4.0–10.5)
nRBC: 0 % (ref 0.0–0.2)

## 2022-01-09 LAB — COMPREHENSIVE METABOLIC PANEL
ALT: 27 U/L (ref 0–44)
AST: 26 U/L (ref 15–41)
Albumin: 1.6 g/dL — ABNORMAL LOW (ref 3.5–5.0)
Alkaline Phosphatase: 142 U/L — ABNORMAL HIGH (ref 38–126)
Anion gap: 4 — ABNORMAL LOW (ref 5–15)
BUN: 16 mg/dL (ref 6–20)
CO2: 24 mmol/L (ref 22–32)
Calcium: 8.4 mg/dL — ABNORMAL LOW (ref 8.9–10.3)
Chloride: 117 mmol/L — ABNORMAL HIGH (ref 98–111)
Creatinine, Ser: 0.52 mg/dL (ref 0.44–1.00)
GFR, Estimated: >60 mL/min (ref >60)
Glucose, Bld: 145 mg/dL — ABNORMAL HIGH (ref 70–99)
Potassium: 3.8 mmol/L (ref 3.5–5.1)
Sodium: 145 mmol/L (ref 135–145)
Total Bilirubin: <0.1 mg/dL — ABNORMAL LOW (ref 0.3–1.2)
Total Protein: 4.7 g/dL — ABNORMAL LOW (ref 6.5–8.1)

## 2022-01-09 LAB — T4, FREE: Free T4: 1.28 ng/dL — ABNORMAL HIGH (ref 0.61–1.12)

## 2022-01-09 LAB — HEMOGLOBIN A1C
Hgb A1c MFr Bld: 6.1 % — ABNORMAL HIGH (ref 4.8–5.6)
Mean Plasma Glucose: 128 mg/dL

## 2022-01-09 LAB — TSH: TSH: 5.33 u[IU]/mL — ABNORMAL HIGH (ref 0.350–4.500)

## 2022-01-09 NOTE — Therapy (Addendum)
Pulmonary Critical Care Medicine [facility] [place]  PULMONARY SERVICE  Date of Service: 01/09/2022  PULMONARY CRITICAL CARE CONSULT   Janet Hensley  RXY:[id number]  DOB: July 26, 1970   DOA: 01/08/2022  Referring Physician: Luna Kitchens, MD  HPI: Janet Hensley is a 51 y.o. female seen for follow up of Acute on Chronic Respiratory Failure.  Patient has multiple medical problems including respiratory failure ARDS sepsis with shock acute renal injury who presents to the hospital with cough shortness of breath nausea vomiting.  Patient at that time was noted to be significantly hypoxic ended up intubated and placed on mechanical ventilation.  Patient has multiple medical other issues going on including development of sepsis was treated with antibiotics patient also has a history of Pseudomonas and acute renal failure.  Patient was started on bicarbonate drips.  Her condition progressed worsened to ARDS and she has been basically a very slow wean is transferred to our facility for further weaning attempts.  Review of Systems:  ROS performed and is unremarkable other than noted above.  Past Medical History:  Diagnosis Date   Asthma 01/15/2012    Past Surgical History:  Procedure Laterality Date   LEFT HEART CATH AND CORONARY ANGIOGRAPHY N/A 12/24/2021   Procedure: LEFT HEART CATH AND CORONARY ANGIOGRAPHY;  Surgeon: Yvonne Kendall, MD;  Location: [facility] INVASIVE CV LAB;  Service: Cardiovascular;  Laterality: N/A;    Social History:    has no history on file for tobacco use, alcohol use, and drug use.  Family History: Non-Contributory to the present illness  No Known Allergies  Medications: Reviewed on Rounds  Physical Exam:  Vitals: Temperature is 97 pulse of 80 respiratory rate was 25 saturation is 98%  Ventilator Settings patient was resting currently on full control assist-control  General: Comfortable at this time Eyes: Grossly normal lids, irises &  conjunctiva ENT: grossly tongue is normal Neck: no obvious mass Cardiovascular: S1-S2 normal no gallop or rub Respiratory: Scattered rhonchi very coarse breath sounds Abdomen: Soft nontender Skin: no rash seen on limited exam Musculoskeletal: not rigid Psychiatric:unable to assess Neurologic: no seizure no involuntary movements         Labs on Admission:  Basic Metabolic Panel: Recent Labs  Lab 01/04/22 0500 01/04/22 1858 01/05/22 0352 01/06/22 0514 01/07/22 0419 01/08/22 0400 01/09/22 0153  NA 139   < > 156* 156* 151* 145 145  K 2.8*   < > 3.5 3.2* 3.8 3.6 3.8  CL 109   < > 117* 123* 122* 116* 117*  CO2 21*   < > 28 25 25 23 24   GLUCOSE 466*   < > 127* 111* 113* 121* 145*  BUN 68*   < > 49* 39* 33* 23* 16  CREATININE 0.85   < > 0.68 0.64 0.47 0.40* 0.52  CALCIUM 8.6*   < > 10.2 8.9 8.7* 8.4* 8.4*  MG 1.4*  --   --  1.7 1.9  --   --    < > = values in this interval not displayed.    No results for input(s): "PHART", "PCO2ART", "PO2ART", "HCO3", "O2SAT" in the last 168 hours.  Liver Function Tests: Recent Labs  Lab 01/09/22 0153  AST 26  ALT 27  ALKPHOS 142*  BILITOT <0.1*  PROT 4.7*  ALBUMIN 1.6*   No results for input(s): "LIPASE", "AMYLASE" in the last 168 hours. No results for input(s): "AMMONIA" in the last 168 hours.  CBC: Recent Labs  Lab 01/03/22 0320 01/04/22 0500  01/05/22 1337 01/07/22 0419 01/08/22 0400 01/09/22 0153  WBC 28.4* 30.5* 25.9* 18.3* 13.5* 11.0*  NEUTROABS 27.8*  --  22.9*  --   --   --   HGB 8.6* 8.4* 8.9* 8.1* 7.9* 8.0*  HCT 27.5* 26.9* 28.9* 26.1* 25.0* 24.8*  MCV 103.8* 103.9* 102.5* 104.8* 102.0* 101.2*  PLT 147* 135* 154 161 144* 156    Cardiac Enzymes: No results for input(s): "CKTOTAL", "CKMB", "CKMBINDEX", "TROPONINI" in the last 168 hours.  BNP (last 3 results) Recent Labs    12/22/21 1836 12/27/21 0424  BNP 15.2 110.4*    ProBNP (last 3 results) No results for input(s): "PROBNP" in the last 8760  hours.   Radiological Exams on Admission: DG Abd Portable 1V  Result Date: 01/08/2022 CLINICAL DATA:  Nasogastric tube placement EXAM: PORTABLE ABDOMEN - 1 VIEW COMPARISON:  01/03/2022 FINDINGS: Feeding tube tip is in the stomach antrum. Left lower lobe airspace opacity noted. No other significant findings. IMPRESSION: 1. Feeding tube tip is in the stomach antrum. 2. Left lower lobe airspace opacity. Electronically Signed   By: Gaylyn Rong M.D.   On: 01/08/2022 16:59   DG Chest Port 1 View  Result Date: 01/08/2022 CLINICAL DATA:  Status post tracheostomy. EXAM: PORTABLE CHEST 1 VIEW COMPARISON:  January 06, 2022. FINDINGS: Stable cardiomediastinal silhouette. Tracheostomy is in grossly good position. Feeding tube is seen entering stomach. Right-sided PICC line is unchanged. Stable left lung opacity is noted with probable loculated left pleural effusion. Mild right basilar atelectasis is noted. Bony thorax is unremarkable. IMPRESSION: Stable support apparatus. Bilateral lung opacities, left greater than right, as described above. Electronically Signed   By: Lupita Raider M.D.   On: 01/08/2022 11:58   DG CHEST PORT 1 VIEW  Result Date: 01/06/2022 CLINICAL DATA:  Ventilator dependent, trach, ARDS EXAM: PORTABLE CHEST 1 VIEW COMPARISON:  01/02/2022 FINDINGS: Tracheostomy and right PICC line are stable position. Exam is rotated to the left. Interval placement of a feeding 2 which extends below the hemidiaphragms into the stomach with the tip not visualized. Minimal improvement in the diffuse left lung consolidative airspace process. Slight improved aeration of the left lower lobe. Difficult to exclude residual left effusion. Stable right lung aeration. No pneumothorax. No significant osseous finding. IMPRESSION: Minimal improvement in the diffuse left lung consolidative airspace process/pneumonia. Electronically Signed   By: Judie Petit.  Shick M.D.   On: 01/06/2022 08:08    Assessment/Plan Active  Problems:   Pneumonia of left lung due to Pseudomonas species (HCC)   ARDS (adult respiratory distress syndrome) (HCC)   Acute on chronic respiratory failure with hypoxia (HCC)   Tracheostomy status (HCC)   Chronic pain syndrome   Acute on chronic respiratory failure with hypoxia patient was started on the weaning protocol today did approximately 2 hours of weaning on pressure support plan is going to continue to advance weaning on pressure support as tolerated. Pneumonitis sepsis with Legionella pneumonia.  It appears the patient has a history of snorting oxycodone and substance use.  Patient has been treated with antibiotics and been on Levaquin been getting total of 7 days ARDS on the last chest x-ray shows minimal improvement of the diffuse disease with no need to continue to follow.  My concern would be if patient will be progressing to fibroproliferative phase of ARDS Tracheostomy status at this time patient with remain on tracheostomy for mechanical ventilation. Chronic pain syndrome we will need to be very careful as far as her pain regimen is  concerned will defer to the primary care team.  I have personally seen and evaluated the patient, evaluated laboratory and imaging results, formulated the assessment and plan and placed orders. The Patient requires high complexity decision making with multiple systems involvement.  Case was discussed on Rounds with the Respiratory Therapy Director and the Respiratory staff Time Spent  Yevonne Pax, MD [facility] Pulmonary Critical Care Medicine Sleep Medicine

## 2022-01-10 NOTE — Progress Notes (Incomplete)
[facility] [place]  PROGRESS NOTE     Janet Hensley  [id number]  DOB: May 06, 1970   DOA: 01/08/2022  Referring Physician: Satira Sark, MD  HPI: Janet Hensley is a 51 y.o. female being followed for ventilator/airway/oxygen weaning Acute on Chronic Respiratory Failure. ***  Medications: Reviewed on Rounds  Physical Exam:  Vitals: ***  Ventilator Settings ***  General: Comfortable at this time Neck: supple Cardiovascular: no malignant arrhythmias Respiratory: *** Skin: no rash seen on limited exam Musculoskeletal: No gross abnormality Psychiatric:unable to assess Neurologic:no involuntary movements         Lab Data:   Basic Metabolic Panel: Recent Labs  Lab 01/04/22 0500 01/04/22 1858 01/05/22 0352 01/06/22 0514 01/07/22 0419 01/08/22 0400 01/09/22 0153  NA 139   < > 156* 156* 151* 145 145  K 2.8*   < > 3.5 3.2* 3.8 3.6 3.8  CL 109   < > 117* 123* 122* 116* 117*  CO2 21*   < > 28 25 25 23 24   GLUCOSE 466*   < > 127* 111* 113* 121* 145*  BUN 68*   < > 49* 39* 33* 23* 16  CREATININE 0.85   < > 0.68 0.64 0.47 0.40* 0.52  CALCIUM 8.6*   < > 10.2 8.9 8.7* 8.4* 8.4*  MG 1.4*  --   --  1.7 1.9  --   --    < > = values in this interval not displayed.    ABG: No results for input(s): "PHART", "PCO2ART", "PO2ART", "HCO3", "O2SAT" in the last 168 hours.  Liver Function Tests: Recent Labs  Lab 01/09/22 0153  AST 26  ALT 27  ALKPHOS 142*  BILITOT <0.1*  PROT 4.7*  ALBUMIN 1.6*   No results for input(s): "LIPASE", "AMYLASE" in the last 168 hours. No results for input(s): "AMMONIA" in the last 168 hours.  CBC: Recent Labs  Lab 01/04/22 0500 01/05/22 1337 01/07/22 0419 01/08/22 0400 01/09/22 0153  WBC 30.5* 25.9* 18.3* 13.5* 11.0*  NEUTROABS  --  22.9*  --   --   --   HGB 8.4* 8.9* 8.1* 7.9* 8.0*  HCT 26.9* 28.9* 26.1* 25.0* 24.8*  MCV 103.9* 102.5* 104.8* 102.0*  101.2*  PLT 135* 154 161 144* 156    Cardiac Enzymes: No results for input(s): "CKTOTAL", "CKMB", "CKMBINDEX", "TROPONINI" in the last 168 hours.  BNP (last 3 results) Recent Labs    12/22/21 1836 12/27/21 0424  BNP 15.2 110.4*    ProBNP (last 3 results) No results for input(s): "PROBNP" in the last 8760 hours.  Radiological Exams: DG Abd Portable 1V  Result Date: 01/08/2022 CLINICAL DATA:  Nasogastric tube placement EXAM: PORTABLE ABDOMEN - 1 VIEW COMPARISON:  01/03/2022 FINDINGS: Feeding tube tip is in the stomach antrum. Left lower lobe airspace opacity noted. No other significant findings. IMPRESSION: 1. Feeding tube tip is in the stomach antrum. 2. Left lower lobe airspace opacity. Electronically Signed   By: Van Clines M.D.   On: 01/08/2022 16:59   DG Chest Port 1 View  Result Date: 01/08/2022 CLINICAL DATA:  Status post tracheostomy. EXAM: PORTABLE CHEST 1 VIEW COMPARISON:  January 06, 2022. FINDINGS: Stable cardiomediastinal silhouette. Tracheostomy is in grossly good position. Feeding tube is seen entering stomach. Right-sided PICC line is unchanged. Stable left lung opacity is noted with probable loculated left pleural effusion. Mild right basilar atelectasis is noted. Bony thorax is unremarkable. IMPRESSION: Stable support  apparatus. Bilateral lung opacities, left greater than right, as described above. Electronically Signed   By: Lupita Raider M.D.   On: 01/08/2022 11:58    Assessment/Plan Active Problems:   Pneumonia of left lung due to Pseudomonas species (HCC)   ARDS (adult respiratory distress syndrome) (HCC)   Acute on chronic respiratory failure with hypoxia (HCC)   Tracheostomy status (HCC)   Chronic pain syndrome    Acute on chronic respiratory failure with hypoxia patient was started on the weaning protocol today did approximately 2 hours of weaning on pressure support plan is going to continue to advance weaning on pressure support as  tolerated. Pneumonitis sepsis with Legionella pneumonia.  It appears the patient has a history of snorting oxycodone and substance use.  Patient has been treated with antibiotics and been on Levaquin been getting total of 7 days ARDS on the last chest x-ray shows minimal improvement of the diffuse disease with no need to continue to follow.  My concern would be if patient will be progressing to fibroproliferative phase of ARDS Tracheostomy status at this time patient with remain on tracheostomy for mechanical ventilation. Chronic pain syndrome we will need to be very careful as far as her pain regimen is concerned will defer to the primary care team.   I have personally seen and evaluated the patient, evaluated laboratory and imaging results, formulated the assessment and plan and placed orders. The Patient requires high complexity decision making with multiple systems involvement.  Rounds were done with the Respiratory Therapy Director and Staff therapists and discussed with nursing staff also.  Yevonne Pax, MD [facility] Pulmonary Critical Care Medicine Sleep Medicine

## 2022-01-11 ENCOUNTER — Other Ambulatory Visit (HOSPITAL_COMMUNITY): Payer: Self-pay

## 2022-01-11 DIAGNOSIS — J90 Pleural effusion, not elsewhere classified: Secondary | ICD-10-CM | POA: Diagnosis not present

## 2022-01-11 DIAGNOSIS — J189 Pneumonia, unspecified organism: Secondary | ICD-10-CM | POA: Diagnosis not present

## 2022-01-11 DIAGNOSIS — J439 Emphysema, unspecified: Secondary | ICD-10-CM | POA: Diagnosis not present

## 2022-01-11 DIAGNOSIS — J9600 Acute respiratory failure, unspecified whether with hypoxia or hypercapnia: Secondary | ICD-10-CM | POA: Diagnosis not present

## 2022-01-11 DIAGNOSIS — D649 Anemia, unspecified: Secondary | ICD-10-CM | POA: Diagnosis not present

## 2022-01-11 DIAGNOSIS — R1310 Dysphagia, unspecified: Secondary | ICD-10-CM | POA: Diagnosis not present

## 2022-01-11 DIAGNOSIS — E46 Unspecified protein-calorie malnutrition: Secondary | ICD-10-CM | POA: Diagnosis not present

## 2022-01-11 DIAGNOSIS — J449 Chronic obstructive pulmonary disease, unspecified: Secondary | ICD-10-CM | POA: Diagnosis not present

## 2022-01-11 DIAGNOSIS — D72829 Elevated white blood cell count, unspecified: Secondary | ICD-10-CM | POA: Diagnosis not present

## 2022-01-12 DIAGNOSIS — J449 Chronic obstructive pulmonary disease, unspecified: Secondary | ICD-10-CM | POA: Diagnosis not present

## 2022-01-12 DIAGNOSIS — R1310 Dysphagia, unspecified: Secondary | ICD-10-CM | POA: Diagnosis not present

## 2022-01-12 DIAGNOSIS — E46 Unspecified protein-calorie malnutrition: Secondary | ICD-10-CM | POA: Diagnosis not present

## 2022-01-12 DIAGNOSIS — J189 Pneumonia, unspecified organism: Secondary | ICD-10-CM | POA: Diagnosis not present

## 2022-01-12 DIAGNOSIS — D72829 Elevated white blood cell count, unspecified: Secondary | ICD-10-CM | POA: Diagnosis not present

## 2022-01-12 DIAGNOSIS — J9600 Acute respiratory failure, unspecified whether with hypoxia or hypercapnia: Secondary | ICD-10-CM | POA: Diagnosis not present

## 2022-01-12 DIAGNOSIS — D649 Anemia, unspecified: Secondary | ICD-10-CM | POA: Diagnosis not present

## 2022-01-13 ENCOUNTER — Telehealth: Payer: Self-pay | Admitting: *Deleted

## 2022-01-13 DIAGNOSIS — D649 Anemia, unspecified: Secondary | ICD-10-CM | POA: Diagnosis not present

## 2022-01-13 DIAGNOSIS — J449 Chronic obstructive pulmonary disease, unspecified: Secondary | ICD-10-CM | POA: Diagnosis not present

## 2022-01-13 DIAGNOSIS — R1310 Dysphagia, unspecified: Secondary | ICD-10-CM | POA: Diagnosis not present

## 2022-01-13 DIAGNOSIS — E46 Unspecified protein-calorie malnutrition: Secondary | ICD-10-CM | POA: Diagnosis not present

## 2022-01-13 DIAGNOSIS — J189 Pneumonia, unspecified organism: Secondary | ICD-10-CM | POA: Diagnosis not present

## 2022-01-13 DIAGNOSIS — J9600 Acute respiratory failure, unspecified whether with hypoxia or hypercapnia: Secondary | ICD-10-CM | POA: Diagnosis not present

## 2022-01-13 DIAGNOSIS — D72829 Elevated white blood cell count, unspecified: Secondary | ICD-10-CM | POA: Diagnosis not present

## 2022-01-13 LAB — LIPASE, BLOOD: Lipase: 64 U/L — ABNORMAL HIGH (ref 11–51)

## 2022-01-13 LAB — CBC
HCT: 26.2 % — ABNORMAL LOW (ref 36.0–46.0)
Hemoglobin: 8.5 g/dL — ABNORMAL LOW (ref 12.0–15.0)
MCH: 33.1 pg (ref 26.0–34.0)
MCHC: 32.4 g/dL (ref 30.0–36.0)
MCV: 101.9 fL — ABNORMAL HIGH (ref 80.0–100.0)
Platelets: 190 10*3/uL (ref 150–400)
RBC: 2.57 MIL/uL — ABNORMAL LOW (ref 3.87–5.11)
RDW: 17.2 % — ABNORMAL HIGH (ref 11.5–15.5)
WBC: 9.8 10*3/uL (ref 4.0–10.5)
nRBC: 0 % (ref 0.0–0.2)

## 2022-01-13 LAB — BASIC METABOLIC PANEL
Anion gap: 7 (ref 5–15)
BUN: 11 mg/dL (ref 6–20)
CO2: 25 mmol/L (ref 22–32)
Calcium: 8.8 mg/dL — ABNORMAL LOW (ref 8.9–10.3)
Chloride: 105 mmol/L (ref 98–111)
Creatinine, Ser: 0.36 mg/dL — ABNORMAL LOW (ref 0.44–1.00)
GFR, Estimated: >60 mL/min (ref >60)
Glucose, Bld: 113 mg/dL — ABNORMAL HIGH (ref 70–99)
Potassium: 4.4 mmol/L (ref 3.5–5.1)
Sodium: 137 mmol/L (ref 135–145)

## 2022-01-13 LAB — MAGNESIUM: Magnesium: 1.6 mg/dL — ABNORMAL LOW (ref 1.7–2.4)

## 2022-01-13 NOTE — Telephone Encounter (Signed)
-----   Message from Cadence David Stall, PA-C sent at 12/25/2021 12:53 PM EST ----- Regarding: hospital f/u Pt needs hosp follow- up in 1 month, still in the hospital though.

## 2022-01-13 NOTE — Telephone Encounter (Signed)
Pt will need follow up in one month once she is discharged. Currently still admitted.

## 2022-01-14 DIAGNOSIS — D649 Anemia, unspecified: Secondary | ICD-10-CM | POA: Diagnosis not present

## 2022-01-14 DIAGNOSIS — D72829 Elevated white blood cell count, unspecified: Secondary | ICD-10-CM | POA: Diagnosis not present

## 2022-01-14 DIAGNOSIS — R1310 Dysphagia, unspecified: Secondary | ICD-10-CM | POA: Diagnosis not present

## 2022-01-14 DIAGNOSIS — J449 Chronic obstructive pulmonary disease, unspecified: Secondary | ICD-10-CM | POA: Diagnosis not present

## 2022-01-14 DIAGNOSIS — J9600 Acute respiratory failure, unspecified whether with hypoxia or hypercapnia: Secondary | ICD-10-CM | POA: Diagnosis not present

## 2022-01-14 DIAGNOSIS — E46 Unspecified protein-calorie malnutrition: Secondary | ICD-10-CM | POA: Diagnosis not present

## 2022-01-14 DIAGNOSIS — J189 Pneumonia, unspecified organism: Secondary | ICD-10-CM | POA: Diagnosis not present

## 2022-01-14 LAB — CULTURE, RESPIRATORY W GRAM STAIN

## 2022-01-14 LAB — MAGNESIUM: Magnesium: 1.9 mg/dL (ref 1.7–2.4)

## 2022-01-14 NOTE — Progress Notes (Addendum)
Pulmonary Critical Care Medicine [facility] [place]   PULMONARY CRITICAL CARE SERVICE  PROGRESS NOTE     Janet Hensley  [id number]:[id number]  DOB: 09/21/1970   DOA: 01/08/2022  Referring Physician: Luna Kitchens, MD  HPI: Janet Hensley is a 51 y.o. female being followed for ventilator/airway/oxygen weaning Acute on Chronic Respiratory Failure.  Patient seen lying in bed, currently on pressure support 12/5 FiO2 28%.  Patient will reattempt 8-hour pressure support goal today, 8-hour pressure support was not completed yesterday due to patient going down to CT.  Patient does have thick yellow secretions present.  Medications: Reviewed on Rounds  Physical Exam:  Vitals: Temp 97.7, pulse 101, respirations 20, BP 123/68, SpO2 97%  Ventilator Settings pressure support 12/5 FiO2 28%  General: Comfortable at this time Neck: supple Cardiovascular: no malignant arrhythmias Respiratory: Bilaterally coarse Skin: no rash seen on limited exam Musculoskeletal: No gross abnormality Psychiatric:unable to assess Neurologic:no involuntary movements         Lab Data:   Basic Metabolic Panel: Recent Labs  Lab 01/08/22 0400 01/09/22 0153 01/13/22 0202  NA 145 145 137  K 3.6 3.8 4.4  CL 116* 117* 105  CO2 23 24 25   GLUCOSE 121* 145* 113*  BUN 23* 16 11  CREATININE 0.40* 0.52 0.36*  CALCIUM 8.4* 8.4* 8.8*  MG  --   --  1.6*    ABG: No results for input(s): "PHART", "PCO2ART", "PO2ART", "HCO3", "O2SAT" in the last 168 hours.  Liver Function Tests: Recent Labs  Lab 01/09/22 0153  AST 26  ALT 27  ALKPHOS 142*  BILITOT <0.1*  PROT 4.7*  ALBUMIN 1.6*   Recent Labs  Lab 01/13/22 0202  LIPASE 64*   No results for input(s): "AMMONIA" in the last 168 hours.  CBC: Recent Labs  Lab 01/08/22 0400 01/09/22 0153 01/13/22 0202  WBC 13.5* 11.0* 9.8  HGB 7.9* 8.0* 8.5*  HCT 25.0* 24.8* 26.2*  MCV 102.0* 101.2* 101.9*  PLT 144* 156 190    Cardiac Enzymes: No  results for input(s): "CKTOTAL", "CKMB", "CKMBINDEX", "TROPONINI" in the last 168 hours.  BNP (last 3 results) Recent Labs    12/22/21 1836 12/27/21 0424  BNP 15.2 110.4*    ProBNP (last 3 results) No results for input(s): "PROBNP" in the last 8760 hours.  Radiological Exams: No results found.  Assessment/Plan Active Problems:   Pneumonia of left lung due to Pseudomonas species (HCC)   ARDS (adult respiratory distress syndrome) (HCC)   Acute on chronic respiratory failure with hypoxia (HCC)   Tracheostomy status (HCC)   Chronic pain syndrome   Acute on chronic respiratory failure with hypoxia-patient's 8-hour pressure support goal yesterday was interrupted by CT, will reattempt 8-hour pressure support goal today. Pneumonitis sepsis with Legionella pneumonia.  It appears the patient has a history of snorting oxycodone and substance use.  Patient has been treated with antibiotics and been on Levaquin been getting total of 7 days ARDS on the last chest x-ray shows minimal improvement of the diffuse disease with no need to continue to follow.  My concern would be if patient will be progressing to fibroproliferative phase of ARDS Tracheostomy status at this time patient with remain on tracheostomy for mechanical ventilation. Chronic pain syndrome we will need to be very careful as far as her pain regimen is concerned will defer to the primary care team.  I have personally seen and evaluated the patient, evaluated laboratory and imaging results, formulated the assessment and plan and  placed orders. The Patient requires high complexity decision making with multiple systems involvement.  Rounds were done with the Respiratory Therapy Director and Staff therapists and discussed with nursing staff also.  Janet Gee, MD [facility] Pulmonary Critical Care Medicine Sleep Medicine

## 2022-01-14 NOTE — Progress Notes (Cosign Needed)
Pulmonary Critical Care Medicine [facility] [place]   PULMONARY CRITICAL CARE SERVICE  PROGRESS NOTE     Janet Hensley  [id number]  DOB: Apr 17, 1970   DOA: 01/08/2022  Referring Physician: Luna Kitchens, MD  HPI: Janet Hensley is a 51 y.o. female being followed for ventilator/airway/oxygen weaning Acute on Chronic Respiratory Failure.  Patient seen lying in bed, currently on ATC 28% working towards a 2-hour goal.  Successfully completed 16 hours of pressure support yesterday.  Medications: Reviewed on Rounds  Physical Exam:  Vitals: Temp 97.0, pulse 96, respirations 25, BP 106/87, SpO2 96%  Ventilator Settings ATC 28%  General: Comfortable at this time Neck: supple Cardiovascular: no malignant arrhythmias Respiratory: Bilaterally diminished Skin: no rash seen on limited exam Musculoskeletal: No gross abnormality Psychiatric:unable to assess Neurologic:no involuntary movements         Lab Data:   Basic Metabolic Panel: Recent Labs  Lab 01/08/22 0400 01/09/22 0153 01/13/22 0202 01/14/22 0914  NA 145 145 137  --   K 3.6 3.8 4.4  --   CL 116* 117* 105  --   CO2 23 24 25   --   GLUCOSE 121* 145* 113*  --   BUN 23* 16 11  --   CREATININE 0.40* 0.52 0.36*  --   CALCIUM 8.4* 8.4* 8.8*  --   MG  --   --  1.6* 1.9    ABG: No results for input(s): "PHART", "PCO2ART", "PO2ART", "HCO3", "O2SAT" in the last 168 hours.  Liver Function Tests: Recent Labs  Lab 01/09/22 0153  AST 26  ALT 27  ALKPHOS 142*  BILITOT <0.1*  PROT 4.7*  ALBUMIN 1.6*   Recent Labs  Lab 01/13/22 0202  LIPASE 64*   No results for input(s): "AMMONIA" in the last 168 hours.  CBC: Recent Labs  Lab 01/08/22 0400 01/09/22 0153 01/13/22 0202  WBC 13.5* 11.0* 9.8  HGB 7.9* 8.0* 8.5*  HCT 25.0* 24.8* 26.2*  MCV 102.0* 101.2* 101.9*  PLT 144* 156 190    Cardiac Enzymes: No results for input(s): "CKTOTAL", "CKMB", "CKMBINDEX", "TROPONINI" in the last 168  hours.  BNP (last 3 results) Recent Labs    12/22/21 1836 12/27/21 0424  BNP 15.2 110.4*    ProBNP (last 3 results) No results for input(s): "PROBNP" in the last 8760 hours.  Radiological Exams: No results found.  Assessment/Plan Active Problems:   Pneumonia of left lung due to Pseudomonas species (HCC)   ARDS (adult respiratory distress syndrome) (HCC)   Acute on chronic respiratory failure with hypoxia (HCC)   Tracheostomy status (HCC)   Chronic pain syndrome   Acute on chronic respiratory failure with hypoxia-patient successfully completed 16-hour pressure support goal yesterday, will work towards 2-hour ATC goal today.  Continue supportive care. Pneumonitis sepsis with Legionella pneumonia.  It appears the patient has a history of snorting oxycodone and substance use.  Patient has been treated with antibiotics and been on Levaquin been getting total of 7 days ARDS on the last chest x-ray shows minimal improvement of the diffuse disease with no need to continue to follow.  My concern would be if patient will be progressing to fibroproliferative phase of ARDS Tracheostomy status at this time patient with remain on tracheostomy for mechanical ventilation. Chronic pain syndrome we will need to be very careful as far as her pain regimen is concerned will defer to the primary care team.   I have personally seen and evaluated the patient, evaluated laboratory and imaging  results, formulated the assessment and plan and placed orders. The Patient requires high complexity decision making with multiple systems involvement.  Rounds were done with the Respiratory Therapy Director and Staff therapists and discussed with nursing staff also.  Allyne Gee, MD [facility] Pulmonary Critical Care Medicine Sleep Medicine

## 2022-01-14 NOTE — Progress Notes (Addendum)
Pulmonary Critical Care Medicine [facility] [place]   PULMONARY CRITICAL CARE SERVICE  PROGRESS NOTE     Janet Hensley  [id number]  DOB: [birth year]   DOA: 01/08/2022  Referring Physician: Luna Kitchens, MD  HPI: Janet Hensley is a 51 y.o. female being followed for ventilator/airway/oxygen weaning Acute on Chronic Respiratory Failure.  Patient seen lying in bed, currently on pressure support working towards an 8-hour goal today.  Successfully completed 4-hour pressure support goal yesterday.  Medications: Reviewed on Rounds  Physical Exam:  Vitals: Temp 97.7, pulse 107, respirations 27, BP 127/65, SpO2 99%  Ventilator Settings pressure support 12/5 FiO2 35%  General: Comfortable at this time Neck: supple Cardiovascular: no malignant arrhythmias Respiratory: Bilaterally diminished Skin: no rash seen on limited exam Musculoskeletal: No gross abnormality Psychiatric:unable to assess Neurologic:no involuntary movements         Lab Data:   Basic Metabolic Panel: Recent Labs  Lab 01/08/22 0400 01/09/22 0153 01/13/22 0202  NA 145 145 137  K 3.6 3.8 4.4  CL 116* 117* 105  CO2 23 24 25   GLUCOSE 121* 145* 113*  BUN 23* 16 11  CREATININE 0.40* 0.52 0.36*  CALCIUM 8.4* 8.4* 8.8*  MG  --   --  1.6*    ABG: No results for input(s): "PHART", "PCO2ART", "PO2ART", "HCO3", "O2SAT" in the last 168 hours.  Liver Function Tests: Recent Labs  Lab 01/09/22 0153  AST 26  ALT 27  ALKPHOS 142*  BILITOT <0.1*  PROT 4.7*  ALBUMIN 1.6*   Recent Labs  Lab 01/13/22 0202  LIPASE 64*   No results for input(s): "AMMONIA" in the last 168 hours.  CBC: Recent Labs  Lab 01/08/22 0400 01/09/22 0153 01/13/22 0202  WBC 13.5* 11.0* 9.8  HGB 7.9* 8.0* 8.5*  HCT 25.0* 24.8* 26.2*  MCV 102.0* 101.2* 101.9*  PLT 144* 156 190    Cardiac Enzymes: No results for input(s): "CKTOTAL", "CKMB", "CKMBINDEX", "TROPONINI" in the last 168 hours.  BNP (last 3  results) Recent Labs    12/22/21 1836 12/27/21 0424  BNP 15.2 110.4*    ProBNP (last 3 results) No results for input(s): "PROBNP" in the last 8760 hours.  Radiological Exams: No results found.  Assessment/Plan Active Problems:   Pneumonia of left lung due to Pseudomonas species (HCC)   ARDS (adult respiratory distress syndrome) (HCC)   Acute on chronic respiratory failure with hypoxia (HCC)   Tracheostomy status (HCC)   Chronic pain syndrome   Acute on chronic respiratory failure with hypoxia-patient successfully completed 4-hour pressure support goal yesterday, will work towards 8-hour pressure support goal today. Pneumonitis sepsis with Legionella pneumonia.  It appears the patient has a history of snorting oxycodone and substance use.  Patient has been treated with antibiotics and been on Levaquin been getting total of 7 days ARDS on the last chest x-ray shows minimal improvement of the diffuse disease with no need to continue to follow.  My concern would be if patient will be progressing to fibroproliferative phase of ARDS Tracheostomy status at this time patient with remain on tracheostomy for mechanical ventilation. Chronic pain syndrome we will need to be very careful as far as her pain regimen is concerned will defer to the primary care team.   I have personally seen and evaluated the patient, evaluated laboratory and imaging results, formulated the assessment and plan and placed orders. The Patient requires high complexity decision making with multiple systems involvement.  Rounds were done with the Respiratory  Therapy Director and Staff therapists and discussed with nursing staff also.  Allyne Gee, MD [facility] Pulmonary Critical Care Medicine Sleep Medicine

## 2022-01-14 NOTE — Progress Notes (Cosign Needed)
Pulmonary Critical Care Medicine [facility] [place]   PULMONARY CRITICAL CARE SERVICE  PROGRESS NOTE     Janet Hensley  [id number]  DOB: 26-Dec-1970   DOA: 01/08/2022  Referring Physician: Luna Kitchens, MD  HPI: Janet Hensley is a 51 y.o. female being followed for ventilator/airway/oxygen weaning Acute on Chronic Respiratory Failure.  Patient seen lying in bed, currently on pressure support.  Successfully completed 12-hour pressure support goal yesterday currently working towards 16-hour pressure support goal today.  Medications: Reviewed on Rounds  Physical Exam:  Vitals: Temp 98.3, pulse 116, respirations 25, BP 119/67, SpO2 96%  Ventilator Settings pressure support 12/5 FiO2 28%  General: Comfortable at this time Neck: supple Cardiovascular: no malignant arrhythmias Respiratory: Bilaterally diminished Skin: no rash seen on limited exam Musculoskeletal: No gross abnormality Psychiatric:unable to assess Neurologic:no involuntary movements         Lab Data:   Basic Metabolic Panel: Recent Labs  Lab 01/08/22 0400 01/09/22 0153 01/13/22 0202  NA 145 145 137  K 3.6 3.8 4.4  CL 116* 117* 105  CO2 23 24 25   GLUCOSE 121* 145* 113*  BUN 23* 16 11  CREATININE 0.40* 0.52 0.36*  CALCIUM 8.4* 8.4* 8.8*  MG  --   --  1.6*    ABG: No results for input(s): "PHART", "PCO2ART", "PO2ART", "HCO3", "O2SAT" in the last 168 hours.  Liver Function Tests: Recent Labs  Lab 01/09/22 0153  AST 26  ALT 27  ALKPHOS 142*  BILITOT <0.1*  PROT 4.7*  ALBUMIN 1.6*   Recent Labs  Lab 01/13/22 0202  LIPASE 64*   No results for input(s): "AMMONIA" in the last 168 hours.  CBC: Recent Labs  Lab 01/08/22 0400 01/09/22 0153 01/13/22 0202  WBC 13.5* 11.0* 9.8  HGB 7.9* 8.0* 8.5*  HCT 25.0* 24.8* 26.2*  MCV 102.0* 101.2* 101.9*  PLT 144* 156 190    Cardiac Enzymes: No results for input(s): "CKTOTAL", "CKMB", "CKMBINDEX", "TROPONINI" in the last 168  hours.  BNP (last 3 results) Recent Labs    12/22/21 1836 12/27/21 0424  BNP 15.2 110.4*    ProBNP (last 3 results) No results for input(s): "PROBNP" in the last 8760 hours.  Radiological Exams: No results found.  Assessment/Plan Active Problems:   Pneumonia of left lung due to Pseudomonas species (HCC)   ARDS (adult respiratory distress syndrome) (HCC)   Acute on chronic respiratory failure with hypoxia (HCC)   Tracheostomy status (HCC)   Chronic pain syndrome   Acute on chronic respiratory failure with hypoxia-patient successfully completed 12-hour pressure support goal yesterday, will work towards 16-hour pressure support goal today. Pneumonitis sepsis with Legionella pneumonia.  It appears the patient has a history of snorting oxycodone and substance use.  Patient has been treated with antibiotics and been on Levaquin been getting total of 7 days ARDS on the last chest x-ray shows minimal improvement of the diffuse disease with no need to continue to follow.  My concern would be if patient will be progressing to fibroproliferative phase of ARDS Tracheostomy status at this time patient with remain on tracheostomy for mechanical ventilation. Chronic pain syndrome we will need to be very careful as far as her pain regimen is concerned will defer to the primary care team.   I have personally seen and evaluated the patient, evaluated laboratory and imaging results, formulated the assessment and plan and placed orders. The Patient requires high complexity decision making with multiple systems involvement.  Rounds were done with  the Respiratory Therapy Director and Staff therapists and discussed with nursing staff also.  Allyne Gee, MD [facility] Pulmonary Critical Care Medicine Sleep Medicine

## 2022-01-14 NOTE — Progress Notes (Addendum)
Pulmonary Critical Care Medicine [facility] [place]   PULMONARY CRITICAL CARE SERVICE  PROGRESS NOTE     Janet Hensley  [id number]  DOB: 15-Mar-1970   DOA: 01/08/2022  Referring Physician: Luna Kitchens, MD  HPI: Janet Hensley is a 51 y.o. female being followed for ventilator/airway/oxygen weaning Acute on Chronic Respiratory Failure.  Patient seen lying in bed, currently on full support ventilation.  Patient successfully completed 8-hour pressure support goal yesterday, will work towards 12-hour pressure support goal today.  Medications: Reviewed on Rounds  Physical Exam:  Vitals: Temp 98.2, pulse 129, respirations 27, BP 130/71, SpO2 96%  Ventilator Settings AC VC, FiO2 28%, tidal volume 420, rate 20, PEEP 5  General: Comfortable at this time Neck: supple Cardiovascular: no malignant arrhythmias Respiratory: Bilaterally diminished Skin: no rash seen on limited exam Musculoskeletal: No gross abnormality Psychiatric:unable to assess Neurologic:no involuntary movements         Lab Data:   Basic Metabolic Panel: Recent Labs  Lab 01/08/22 0400 01/09/22 0153 01/13/22 0202  NA 145 145 137  K 3.6 3.8 4.4  CL 116* 117* 105  CO2 23 24 25   GLUCOSE 121* 145* 113*  BUN 23* 16 11  CREATININE 0.40* 0.52 0.36*  CALCIUM 8.4* 8.4* 8.8*  MG  --   --  1.6*    ABG: No results for input(s): "PHART", "PCO2ART", "PO2ART", "HCO3", "O2SAT" in the last 168 hours.  Liver Function Tests: Recent Labs  Lab 01/09/22 0153  AST 26  ALT 27  ALKPHOS 142*  BILITOT <0.1*  PROT 4.7*  ALBUMIN 1.6*   Recent Labs  Lab 01/13/22 0202  LIPASE 64*   No results for input(s): "AMMONIA" in the last 168 hours.  CBC: Recent Labs  Lab 01/08/22 0400 01/09/22 0153 01/13/22 0202  WBC 13.5* 11.0* 9.8  HGB 7.9* 8.0* 8.5*  HCT 25.0* 24.8* 26.2*  MCV 102.0* 101.2* 101.9*  PLT 144* 156 190    Cardiac Enzymes: No results for input(s): "CKTOTAL", "CKMB", "CKMBINDEX",  "TROPONINI" in the last 168 hours.  BNP (last 3 results) Recent Labs    12/22/21 1836 12/27/21 0424  BNP 15.2 110.4*    ProBNP (last 3 results) No results for input(s): "PROBNP" in the last 8760 hours.  Radiological Exams: No results found.  Assessment/Plan Active Problems:   Pneumonia of left lung due to Pseudomonas species (HCC)   ARDS (adult respiratory distress syndrome) (HCC)   Acute on chronic respiratory failure with hypoxia (HCC)   Tracheostomy status (HCC)   Chronic pain syndrome   Acute on chronic respiratory failure with hypoxia-patient successfully completed 8-hour pressure support goal yesterday, will work towards 12-hour pressure support goal today. Pneumonitis sepsis with Legionella pneumonia.  It appears the patient has a history of snorting oxycodone and substance use.  Patient has been treated with antibiotics and been on Levaquin been getting total of 7 days ARDS on the last chest x-ray shows minimal improvement of the diffuse disease with no need to continue to follow.  My concern would be if patient will be progressing to fibroproliferative phase of ARDS Tracheostomy status at this time patient with remain on tracheostomy for mechanical ventilation. Chronic pain syndrome we will need to be very careful as far as her pain regimen is concerned will defer to the primary care team.  I have personally seen and evaluated the patient, evaluated laboratory and imaging results, formulated the assessment and plan and placed orders. The Patient requires high complexity decision making with multiple  systems involvement.  Rounds were done with the Respiratory Therapy Director and Staff therapists and discussed with nursing staff also.  Allyne Gee, MD [facility] Pulmonary Critical Care Medicine Sleep Medicine

## 2022-01-14 NOTE — Telephone Encounter (Signed)
Reviewed the patient's chart. She is currently still admitted to [facility] and on a ventilator.  Routing this message back to the requesting provider as an FYI about hospital f/u.

## 2022-01-15 DIAGNOSIS — R1310 Dysphagia, unspecified: Secondary | ICD-10-CM | POA: Diagnosis not present

## 2022-01-15 DIAGNOSIS — E46 Unspecified protein-calorie malnutrition: Secondary | ICD-10-CM | POA: Diagnosis not present

## 2022-01-15 DIAGNOSIS — J189 Pneumonia, unspecified organism: Secondary | ICD-10-CM | POA: Diagnosis not present

## 2022-01-15 DIAGNOSIS — D72829 Elevated white blood cell count, unspecified: Secondary | ICD-10-CM | POA: Diagnosis not present

## 2022-01-15 DIAGNOSIS — J9600 Acute respiratory failure, unspecified whether with hypoxia or hypercapnia: Secondary | ICD-10-CM | POA: Diagnosis not present

## 2022-01-15 DIAGNOSIS — J449 Chronic obstructive pulmonary disease, unspecified: Secondary | ICD-10-CM | POA: Diagnosis not present

## 2022-01-15 DIAGNOSIS — D649 Anemia, unspecified: Secondary | ICD-10-CM | POA: Diagnosis not present

## 2022-01-15 LAB — CBC
HCT: 26.3 % — ABNORMAL LOW (ref 36.0–46.0)
Hemoglobin: 8.7 g/dL — ABNORMAL LOW (ref 12.0–15.0)
MCH: 33.3 pg (ref 26.0–34.0)
MCHC: 33.1 g/dL (ref 30.0–36.0)
MCV: 100.8 fL — ABNORMAL HIGH (ref 80.0–100.0)
Platelets: 232 10*3/uL (ref 150–400)
RBC: 2.61 MIL/uL — ABNORMAL LOW (ref 3.87–5.11)
RDW: 17.2 % — ABNORMAL HIGH (ref 11.5–15.5)
WBC: 11.2 10*3/uL — ABNORMAL HIGH (ref 4.0–10.5)
nRBC: 0 % (ref 0.0–0.2)

## 2022-01-15 LAB — BASIC METABOLIC PANEL
Anion gap: 8 (ref 5–15)
BUN: 11 mg/dL (ref 6–20)
CO2: 23 mmol/L (ref 22–32)
Calcium: 9.4 mg/dL (ref 8.9–10.3)
Chloride: 106 mmol/L (ref 98–111)
Creatinine, Ser: 0.41 mg/dL — ABNORMAL LOW (ref 0.44–1.00)
GFR, Estimated: >60 mL/min (ref >60)
Glucose, Bld: 119 mg/dL — ABNORMAL HIGH (ref 70–99)
Potassium: 4.2 mmol/L (ref 3.5–5.1)
Sodium: 137 mmol/L (ref 135–145)

## 2022-01-15 LAB — FOLATE: Folate: 8.8 ng/mL (ref >5.9)

## 2022-01-15 LAB — VITAMIN B12: Vitamin B-12: 566 pg/mL (ref 180–914)

## 2022-01-15 LAB — MAGNESIUM: Magnesium: 1.8 mg/dL (ref 1.7–2.4)

## 2022-01-15 NOTE — Telephone Encounter (Signed)
Marianne Sofia, PA-C  Sent: Wed January 15, 2022  8:14 AM  To: Jefferey Pica, RN         Message  Ok, no worries can disregard for now

## 2022-01-16 DIAGNOSIS — E46 Unspecified protein-calorie malnutrition: Secondary | ICD-10-CM | POA: Diagnosis not present

## 2022-01-16 DIAGNOSIS — J449 Chronic obstructive pulmonary disease, unspecified: Secondary | ICD-10-CM | POA: Diagnosis not present

## 2022-01-16 DIAGNOSIS — R1310 Dysphagia, unspecified: Secondary | ICD-10-CM | POA: Diagnosis not present

## 2022-01-16 DIAGNOSIS — J9600 Acute respiratory failure, unspecified whether with hypoxia or hypercapnia: Secondary | ICD-10-CM | POA: Diagnosis not present

## 2022-01-16 DIAGNOSIS — J189 Pneumonia, unspecified organism: Secondary | ICD-10-CM | POA: Diagnosis not present

## 2022-01-16 DIAGNOSIS — D649 Anemia, unspecified: Secondary | ICD-10-CM | POA: Diagnosis not present

## 2022-01-16 DIAGNOSIS — D72829 Elevated white blood cell count, unspecified: Secondary | ICD-10-CM | POA: Diagnosis not present

## 2022-01-16 NOTE — Progress Notes (Signed)
Pulmonary Critical Care Medicine [facility] [place]   PULMONARY CRITICAL CARE SERVICE  PROGRESS NOTE     Janet Hensley  YQM:[id number]  DOB: 1970-07-15   DOA: 01/08/2022  Referring Physician: Luna Kitchens, MD  HPI: Janet Hensley is a 51 y.o. female being followed for ventilator/airway/oxygen weaning Acute on Chronic Respiratory Failure.  Patient is on T collar currently on 28% FiO2 saturations are good  Medications: Reviewed on Rounds  Physical Exam:  Vitals: Temperature is 99.1 pulse of 100 respiratory rate 30 blood pressure is 141/75 saturations are 98%  Ventilator Settings on T collar FiO2 is 28%  General: Comfortable at this time Neck: supple Cardiovascular: no malignant arrhythmias Respiratory: No rhonchi very coarse breath sounds Skin: no rash seen on limited exam Musculoskeletal: No gross abnormality Psychiatric:unable to assess Neurologic:no involuntary movements         Lab Data:   Basic Metabolic Panel: Recent Labs  Lab 01/13/22 0202 01/14/22 0914 01/15/22 0137  NA 137  --  137  K 4.4  --  4.2  CL 105  --  106  CO2 25  --  23  GLUCOSE 113*  --  119*  BUN 11  --  11  CREATININE 0.36*  --  0.41*  CALCIUM 8.8*  --  9.4  MG 1.6* 1.9 1.8    ABG: No results for input(s): "PHART", "PCO2ART", "PO2ART", "HCO3", "O2SAT" in the last 168 hours.  Liver Function Tests: No results for input(s): "AST", "ALT", "ALKPHOS", "BILITOT", "PROT", "ALBUMIN" in the last 168 hours. Recent Labs  Lab 01/13/22 0202  LIPASE 64*   No results for input(s): "AMMONIA" in the last 168 hours.  CBC: Recent Labs  Lab 01/13/22 0202 01/15/22 0137  WBC 9.8 11.2*  HGB 8.5* 8.7*  HCT 26.2* 26.3*  MCV 101.9* 100.8*  PLT 190 232    Cardiac Enzymes: No results for input(s): "CKTOTAL", "CKMB", "CKMBINDEX", "TROPONINI" in the last 168 hours.  BNP (last 3 results) Recent Labs    12/22/21 1836 12/27/21 0424  BNP 15.2 110.4*    ProBNP (last 3  results) No results for input(s): "PROBNP" in the last 8760 hours.  Radiological Exams: No results found.  Assessment/Plan Active Problems:   Pneumonia of left lung due to Pseudomonas species (HCC)   ARDS (adult respiratory distress syndrome) (HCC)   Acute on chronic respiratory failure with hypoxia (HCC)   Tracheostomy status (HCC)   Chronic pain syndrome   Acute on chronic respiratory failure hypoxia plan is going to be to continue with weaning patient is on T-piece.  Continue secretion management pulmonary toilet. ARDS slow to improve patient has been mostly left lung involvement may have very well resulted from an aspiration event Tracheostomy remains in place we will continue to monitor Chronic pain syndrome pain management Pseudomonas pneumonia has been treated with antibiotics we will continue to monitor   I have personally seen and evaluated the patient, evaluated laboratory and imaging results, formulated the assessment and plan and placed orders. The Patient requires high complexity decision making with multiple systems involvement.  Rounds were done with the Respiratory Therapy Director and Staff therapists and discussed with nursing staff also.  Yevonne Pax, MD [facility] Pulmonary Critical Care Medicine Sleep Medicine

## 2022-01-17 LAB — CBC
HCT: 26.9 % — ABNORMAL LOW (ref 36.0–46.0)
Hemoglobin: 8.8 g/dL — ABNORMAL LOW (ref 12.0–15.0)
MCH: 33.7 pg (ref 26.0–34.0)
MCHC: 32.7 g/dL (ref 30.0–36.0)
MCV: 103.1 fL — ABNORMAL HIGH (ref 80.0–100.0)
Platelets: 294 10*3/uL (ref 150–400)
RBC: 2.61 MIL/uL — ABNORMAL LOW (ref 3.87–5.11)
RDW: 16.7 % — ABNORMAL HIGH (ref 11.5–15.5)
WBC: 12.2 10*3/uL — ABNORMAL HIGH (ref 4.0–10.5)
nRBC: 0 % (ref 0.0–0.2)

## 2022-01-17 LAB — BASIC METABOLIC PANEL
Anion gap: 7 (ref 5–15)
BUN: 19 mg/dL (ref 6–20)
CO2: 23 mmol/L (ref 22–32)
Calcium: 9.5 mg/dL (ref 8.9–10.3)
Chloride: 107 mmol/L (ref 98–111)
Creatinine, Ser: 0.49 mg/dL (ref 0.44–1.00)
GFR, Estimated: >60 mL/min (ref >60)
Glucose, Bld: 117 mg/dL — ABNORMAL HIGH (ref 70–99)
Potassium: 4.5 mmol/L (ref 3.5–5.1)
Sodium: 137 mmol/L (ref 135–145)

## 2022-01-17 LAB — MAGNESIUM: Magnesium: 1.8 mg/dL (ref 1.7–2.4)

## 2022-01-17 NOTE — Progress Notes (Signed)
Pulmonary Critical Care Medicine [facility] [place]   PULMONARY CRITICAL CARE SERVICE  PROGRESS NOTE     Evey C Feeser  [id number]:[id number]  DOB: [birth year]   DOA: 01/08/2022  Referring Physician: Luna Kitchens, MD  HPI: Romelle C Mcnamee is a 51 y.o. female being followed for ventilator/airway/oxygen weaning Acute on Chronic Respiratory Failure.  Patient is been tolerating weaning did about 4 hours of T collar yesterday  Medications: Reviewed on Rounds  Physical Exam:  Vitals: Temperature is 96.8 pulse of 109 respiratory 28 blood pressure 141/69 saturation is 100%  Ventilator Settings on assist-control FiO2 is 28% tidal volume 420 PEEP of 5  General: Comfortable at this time Neck: supple Cardiovascular: no malignant arrhythmias Respiratory: No rhonchi very coarse breath sounds Skin: no rash seen on limited exam Musculoskeletal: No gross abnormality Psychiatric:unable to assess Neurologic:no involuntary movements         Lab Data:   Basic Metabolic Panel: Recent Labs  Lab 01/13/22 0202 01/14/22 0914 01/15/22 0137 01/17/22 0127  NA 137  --  137 137  K 4.4  --  4.2 4.5  CL 105  --  106 107  CO2 25  --  23 23  GLUCOSE 113*  --  119* 117*  BUN 11  --  11 19  CREATININE 0.36*  --  0.41* 0.49  CALCIUM 8.8*  --  9.4 9.5  MG 1.6* 1.9 1.8 1.8    ABG: No results for input(s): "PHART", "PCO2ART", "PO2ART", "HCO3", "O2SAT" in the last 168 hours.  Liver Function Tests: No results for input(s): "AST", "ALT", "ALKPHOS", "BILITOT", "PROT", "ALBUMIN" in the last 168 hours. Recent Labs  Lab 01/13/22 0202  LIPASE 64*   No results for input(s): "AMMONIA" in the last 168 hours.  CBC: Recent Labs  Lab 01/13/22 0202 01/15/22 0137 01/17/22 0127  WBC 9.8 11.2* 12.2*  HGB 8.5* 8.7* 8.8*  HCT 26.2* 26.3* 26.9*  MCV 101.9* 100.8* 103.1*  PLT 190 232 294    Cardiac Enzymes: No results for input(s): "CKTOTAL", "CKMB", "CKMBINDEX", "TROPONINI" in the last 168  hours.  BNP (last 3 results) Recent Labs    12/22/21 1836 12/27/21 0424  BNP 15.2 110.4*    ProBNP (last 3 results) No results for input(s): "PROBNP" in the last 8760 hours.  Radiological Exams: No results found.  Assessment/Plan Active Problems:   Pneumonia of left lung due to Pseudomonas species (HCC)   ARDS (adult respiratory distress syndrome) (HCC)   Acute on chronic respiratory failure with hypoxia (HCC)   Tracheostomy status (HCC)   Chronic pain syndrome   Acute on chronic respiratory failure with hypoxia the plan is to wean for 8 hours off the ventilator on T collar ARDS improving will continue to monitor the left lung which has majority of damage Pseudomonas pneumonia involving left lung fairly extensive infiltrates Tracheostomy remains in place Chronic pain syndrome pain management   I have personally seen and evaluated the patient, evaluated laboratory and imaging results, formulated the assessment and plan and placed orders. The Patient requires high complexity decision making with multiple systems involvement.  Rounds were done with the Respiratory Therapy Director and Staff therapists and discussed with nursing staff also.  Yevonne Pax, MD [facility] Pulmonary Critical Care Medicine Sleep Medicine

## 2022-01-18 NOTE — Progress Notes (Signed)
Pulmonary Critical Care Medicine [facility] [place]   PULMONARY CRITICAL CARE SERVICE  PROGRESS NOTE     Janet Hensley  [id number]  DOB: 1970-11-14   DOA: 01/08/2022  Referring Physician: Luna Kitchens, MD  HPI: Janet Hensley is a 51 y.o. female being followed for ventilator/airway/oxygen weaning Acute on Chronic Respiratory Failure.  Patient has been on a weaning protocol on T-bar or going to continue to expand the weaning  Medications: Reviewed on Rounds  Physical Exam:  Vitals: Temperature is 97.3 pulse 87 respiratory 21 blood pressure 98/45 saturation 95%  Ventilator Settings off the ventilator on T collar  General: Comfortable at this time Neck: supple Cardiovascular: no malignant arrhythmias Respiratory: Scattered rhonchi expansion is equal Skin: no rash seen on limited exam Musculoskeletal: No gross abnormality Psychiatric:unable to assess Neurologic:no involuntary movements         Lab Data:   Basic Metabolic Panel: Recent Labs  Lab 01/13/22 0202 01/14/22 0914 01/15/22 0137 01/17/22 0127  NA 137  --  137 137  K 4.4  --  4.2 4.5  CL 105  --  106 107  CO2 25  --  23 23  GLUCOSE 113*  --  119* 117*  BUN 11  --  11 19  CREATININE 0.36*  --  0.41* 0.49  CALCIUM 8.8*  --  9.4 9.5  MG 1.6* 1.9 1.8 1.8    ABG: No results for input(s): "PHART", "PCO2ART", "PO2ART", "HCO3", "O2SAT" in the last 168 hours.  Liver Function Tests: No results for input(s): "AST", "ALT", "ALKPHOS", "BILITOT", "PROT", "ALBUMIN" in the last 168 hours. Recent Labs  Lab 01/13/22 0202  LIPASE 64*   No results for input(s): "AMMONIA" in the last 168 hours.  CBC: Recent Labs  Lab 01/13/22 0202 01/15/22 0137 01/17/22 0127  WBC 9.8 11.2* 12.2*  HGB 8.5* 8.7* 8.8*  HCT 26.2* 26.3* 26.9*  MCV 101.9* 100.8* 103.1*  PLT 190 232 294    Cardiac Enzymes: No results for input(s): "CKTOTAL", "CKMB", "CKMBINDEX", "TROPONINI" in the last 168 hours.  BNP (last  3 results) Recent Labs    12/22/21 1836 12/27/21 0424  BNP 15.2 110.4*    ProBNP (last 3 results) No results for input(s): "PROBNP" in the last 8760 hours.  Radiological Exams: No results found.  Assessment/Plan Active Problems:   Pneumonia of left lung due to Pseudomonas species (HCC)   ARDS (adult respiratory distress syndrome) (HCC)   Acute on chronic respiratory failure with hypoxia (HCC)   Tracheostomy status (HCC)   Chronic pain syndrome   Acute on chronic respiratory failure hypoxia will continue with weaning protocol advance as tolerated ARU supportive care majority of the damage on the left side continue to follow-up with x-rays Tracheostomy remains in place Chronic pain syndrome pain management Pseudomonas pneumonia has been treated with antibiotics   I have personally seen and evaluated the patient, evaluated laboratory and imaging results, formulated the assessment and plan and placed orders. The Patient requires high complexity decision making with multiple systems involvement.  Rounds were done with the Respiratory Therapy Director and Staff therapists and discussed with nursing staff also.  Yevonne Pax, MD [facility] Pulmonary Critical Care Medicine Sleep Medicine

## 2022-01-19 ENCOUNTER — Other Ambulatory Visit (HOSPITAL_COMMUNITY): Payer: Self-pay

## 2022-01-19 LAB — VITAMIN B1: Vitamin B1 (Thiamine): 127.9 nmol/L (ref 66.5–200.0)

## 2022-01-19 NOTE — Progress Notes (Signed)
Pulmonary Critical Care Medicine [facility] [place]   PULMONARY CRITICAL CARE SERVICE  PROGRESS NOTE     Janet Hensley  [id number]  DOB: May 29, 1970   DOA: 01/08/2022  Referring Physician: Luna Kitchens, MD  HPI: Janet Hensley is a 51 y.o. female being followed for ventilator/airway/oxygen weaning Acute on Chronic Respiratory Failure.  Patient resting comfortably without distress at this time has been on T collar on 28% FiO2 with a goal of 16 hours today.  Patient's ex-husband was at the bedside I reviewed the CT scan results with him showed that majority of the damage is on the left side.  Explained to him that more than likely this could have resulted from aspiration since the right lung is relatively spared  Medications: Reviewed on Rounds  Physical Exam:  Vitals: Temperature is 97.0 pulse of 92 respiratory rate 24 blood pressure 103/56 saturation 97%  Ventilator Settings on T collar FiO2 is 28%  General: Comfortable at this time Neck: supple Cardiovascular: no malignant arrhythmias Respiratory: No rhonchi very coarse breath sounds Skin: no rash seen on limited exam Musculoskeletal: No gross abnormality Psychiatric:unable to assess Neurologic:no involuntary movements         Lab Data:   Basic Metabolic Panel: Recent Labs  Lab 01/13/22 0202 01/14/22 0914 01/15/22 0137 01/17/22 0127  NA 137  --  137 137  K 4.4  --  4.2 4.5  CL 105  --  106 107  CO2 25  --  23 23  GLUCOSE 113*  --  119* 117*  BUN 11  --  11 19  CREATININE 0.36*  --  0.41* 0.49  CALCIUM 8.8*  --  9.4 9.5  MG 1.6* 1.9 1.8 1.8    ABG: No results for input(s): "PHART", "PCO2ART", "PO2ART", "HCO3", "O2SAT" in the last 168 hours.  Liver Function Tests: No results for input(s): "AST", "ALT", "ALKPHOS", "BILITOT", "PROT", "ALBUMIN" in the last 168 hours. Recent Labs  Lab 01/13/22 0202  LIPASE 64*   No results for input(s): "AMMONIA" in the last 168 hours.  CBC: Recent Labs   Lab 01/13/22 0202 01/15/22 0137 01/17/22 0127  WBC 9.8 11.2* 12.2*  HGB 8.5* 8.7* 8.8*  HCT 26.2* 26.3* 26.9*  MCV 101.9* 100.8* 103.1*  PLT 190 232 294    Cardiac Enzymes: No results for input(s): "CKTOTAL", "CKMB", "CKMBINDEX", "TROPONINI" in the last 168 hours.  BNP (last 3 results) Recent Labs    12/22/21 1836 12/27/21 0424  BNP 15.2 110.4*    ProBNP (last 3 results) No results for input(s): "PROBNP" in the last 8760 hours.  Radiological Exams: No results found.  Assessment/Plan Active Problems:   Pneumonia of left lung due to Pseudomonas species (HCC)   ARDS (adult respiratory distress syndrome) (HCC)   Acute on chronic respiratory failure with hypoxia (HCC)   Tracheostomy status (HCC)   Chronic pain syndrome   Acute on chronic respiratory failure with hypoxia will continue with T collar trials on 20% FiO2 goal of 16 hours ARDS basically has right-sided normal appearance left side dense pneumonia with possible early cavitation.  Will get a follow-up CT scan Tracheostomy remains in place Pseudomonas pneumonia has been treated with antibiotics Chronic pain syndrome pain management   I have personally seen and evaluated the patient, evaluated laboratory and imaging results, formulated the assessment and plan and placed orders. The Patient requires high complexity decision making with multiple systems involvement.  Rounds were done with the Respiratory Therapy Director and Staff therapists and  discussed with nursing staff also.  Allyne Gee, MD [facility] Pulmonary Critical Care Medicine Sleep Medicine

## 2022-01-20 ENCOUNTER — Other Ambulatory Visit (HOSPITAL_COMMUNITY): Payer: Self-pay

## 2022-01-20 LAB — COMPREHENSIVE METABOLIC PANEL
ALT: 28 U/L (ref 0–44)
AST: 20 U/L (ref 15–41)
Albumin: 1.8 g/dL — ABNORMAL LOW (ref 3.5–5.0)
Alkaline Phosphatase: 130 U/L — ABNORMAL HIGH (ref 38–126)
Anion gap: 10 (ref 5–15)
BUN: 27 mg/dL — ABNORMAL HIGH (ref 6–20)
CO2: 23 mmol/L (ref 22–32)
Calcium: 9.9 mg/dL (ref 8.9–10.3)
Chloride: 102 mmol/L (ref 98–111)
Creatinine, Ser: 0.86 mg/dL (ref 0.44–1.00)
GFR, Estimated: >60 mL/min (ref >60)
Glucose, Bld: 132 mg/dL — ABNORMAL HIGH (ref 70–99)
Potassium: 5.2 mmol/L — ABNORMAL HIGH (ref 3.5–5.1)
Sodium: 135 mmol/L (ref 135–145)
Total Bilirubin: 0.3 mg/dL (ref 0.3–1.2)
Total Protein: 6.8 g/dL (ref 6.5–8.1)

## 2022-01-20 LAB — CBC
HCT: 25.8 % — ABNORMAL LOW (ref 36.0–46.0)
Hemoglobin: 8.2 g/dL — ABNORMAL LOW (ref 12.0–15.0)
MCH: 32.5 pg (ref 26.0–34.0)
MCHC: 31.8 g/dL (ref 30.0–36.0)
MCV: 102.4 fL — ABNORMAL HIGH (ref 80.0–100.0)
Platelets: 415 10*3/uL — ABNORMAL HIGH (ref 150–400)
RBC: 2.52 MIL/uL — ABNORMAL LOW (ref 3.87–5.11)
RDW: 15.9 % — ABNORMAL HIGH (ref 11.5–15.5)
WBC: 11.4 10*3/uL — ABNORMAL HIGH (ref 4.0–10.5)
nRBC: 0 % (ref 0.0–0.2)

## 2022-01-20 NOTE — Progress Notes (Cosign Needed)
Pulmonary Critical Care Medicine [facility] [place]   PULMONARY CRITICAL CARE SERVICE  PROGRESS NOTE     Janet Hensley  [id number]  DOB: 01-22-1971   DOA: 01/08/2022  Referring Physician: Luna Kitchens, MD  HPI: Janet Hensley is a 51 y.o. female being followed for ventilator/airway/oxygen weaning Acute on Chronic Respiratory Failure.  Patient seen lying in bed, currently on ATC 28%.  Successfully completed 16-hour ATC goal yesterday, today will work towards 20-hour ATC goal.  Medications: Reviewed on Rounds  Physical Exam:  Vitals: Temp 99.6, pulse 95, respirations 20, BP 101/52, SpO2 97%  Ventilator Settings ATC 28%  General: Comfortable at this time Neck: supple Cardiovascular: no malignant arrhythmias Respiratory: Bilaterally diminished Skin: no rash seen on limited exam Musculoskeletal: No gross abnormality Psychiatric:unable to assess Neurologic:no involuntary movements         Lab Data:   Basic Metabolic Panel: Recent Labs  Lab 01/14/22 0914 01/15/22 0137 01/17/22 0127 01/20/22 0112  NA  --  137 137 135  K  --  4.2 4.5 5.2*  CL  --  106 107 102  CO2  --  23 23 23   GLUCOSE  --  119* 117* 132*  BUN  --  11 19 27*  CREATININE  --  0.41* 0.49 0.86  CALCIUM  --  9.4 9.5 9.9  MG 1.9 1.8 1.8  --     ABG: No results for input(s): "PHART", "PCO2ART", "PO2ART", "HCO3", "O2SAT" in the last 168 hours.  Liver Function Tests: Recent Labs  Lab 01/20/22 0112  AST 20  ALT 28  ALKPHOS 130*  BILITOT 0.3  PROT 6.8  ALBUMIN 1.8*   No results for input(s): "LIPASE", "AMYLASE" in the last 168 hours. No results for input(s): "AMMONIA" in the last 168 hours.  CBC: Recent Labs  Lab 01/15/22 0137 01/17/22 0127 01/20/22 0112  WBC 11.2* 12.2* 11.4*  HGB 8.7* 8.8* 8.2*  HCT 26.3* 26.9* 25.8*  MCV 100.8* 103.1* 102.4*  PLT 232 294 415*    Cardiac Enzymes: No results for input(s): "CKTOTAL", "CKMB", "CKMBINDEX", "TROPONINI" in the last  168 hours.  BNP (last 3 results) Recent Labs    12/22/21 1836 12/27/21 0424  BNP 15.2 110.4*    ProBNP (last 3 results) No results for input(s): "PROBNP" in the last 8760 hours.  Radiological Exams: DG Abd 1 View  Result Date: 01/20/2022 CLINICAL DATA:  Nasal/orogastric tube placement. EXAM: ABDOMEN - 1 VIEW COMPARISON:  01/19/2022. FINDINGS: Metal tip of the enteric tube projects in the right mid abdomen consistent with positioning in the distal stomach, similar to the prior exam. Normal bowel gas pattern. IMPRESSION: 1. Enteric feeding tube tip projects in the distal stomach, without significant change from the previous day's study. Electronically Signed   By: 01/21/2022 M.D.   On: 01/20/2022 11:29   DG Abd Portable 1V  Result Date: 01/19/2022 CLINICAL DATA:  Feeding tube placement, ileus EXAM: PORTABLE ABDOMEN - 1 VIEW COMPARISON:  01/08/2022 FINDINGS: Tip of feeding tube is seen in the distal antrum of the stomach. Bowel gas pattern is nonspecific. There is no significant small bowel dilation. No abnormal masses or calcifications are seen. Degenerative changes are noted in lumbar spine. Large infiltrate is seen in left mid and left lower lung fields. IMPRESSION: Nonspecific bowel gas pattern. Tip of feeding tube is seen in the distal antrum of the stomach. There is large infiltrate in left lung suggesting atelectasis/pneumonia and possibly pleural effusion. Electronically Signed   By:  Ernie Avena M.D.   On: 01/19/2022 13:36    Assessment/Plan Active Problems:   Pneumonia of left lung due to Pseudomonas species (HCC)   ARDS (adult respiratory distress syndrome) (HCC)   Acute on chronic respiratory failure with hypoxia (HCC)   Tracheostomy status (HCC)   Chronic pain syndrome   Acute on chronic respiratory failure with hypoxia successfully completed 16-hour ATC goal yesterday, today will work towards 20-hour ATC goal. ARDS basically has right-sided normal appearance  left side dense pneumonia with possible early cavitation.  Will get a follow-up CT scan Tracheostomy remains in place Pseudomonas pneumonia has been treated with antibiotics Chronic pain syndrome pain management   I have personally seen and evaluated the patient, evaluated laboratory and imaging results, formulated the assessment and plan and placed orders. The Patient requires high complexity decision making with multiple systems involvement.  Rounds were done with the Respiratory Therapy Director and Staff therapists and discussed with nursing staff also.  Yevonne Pax, MD [facility] Pulmonary Critical Care Medicine Sleep Medicine

## 2022-01-21 LAB — POTASSIUM: Potassium: 4.2 mmol/L (ref 3.5–5.1)

## 2022-01-21 NOTE — Progress Notes (Signed)
Pulmonary Critical Care Medicine [facility] [place]   PULMONARY CRITICAL CARE SERVICE  PROGRESS NOTE     Janet Hensley  [id number]  DOB: Aug 04, 1970   DOA: 01/08/2022  Referring Physician: Luna Kitchens, MD  HPI: Janet Hensley is a 51 y.o. female being followed for ventilator/airway/oxygen weaning Acute on Chronic Respiratory Failure.  Patient currently is on full support on the ventilator she was able to do her goal of 20 hours T collar yesterday and today she will be starting her goal of 24 hours T collar  Medications: Reviewed on Rounds  Physical Exam:  Vitals: Temperature is 97.2 pulse of 105 respiratory rate 30 blood pressure 101/58 saturation is 96%  Ventilator Settings patient is on assist-control FiO2 28% tidal volume 475 PEEP 5  General: Comfortable at this time Neck: supple Cardiovascular: no malignant arrhythmias Respiratory: No rhonchi no rales are noted at this time Skin: no rash seen on limited exam Musculoskeletal: No gross abnormality Psychiatric:unable to assess Neurologic:no involuntary movements         Lab Data:   Basic Metabolic Panel: Recent Labs  Lab 01/15/22 0137 01/17/22 0127 01/20/22 0112 01/21/22 0154  NA 137 137 135  --   K 4.2 4.5 5.2* 4.2  CL 106 107 102  --   CO2 23 23 23   --   GLUCOSE 119* 117* 132*  --   BUN 11 19 27*  --   CREATININE 0.41* 0.49 0.86  --   CALCIUM 9.4 9.5 9.9  --   MG 1.8 1.8  --   --     ABG: No results for input(s): "PHART", "PCO2ART", "PO2ART", "HCO3", "O2SAT" in the last 168 hours.  Liver Function Tests: Recent Labs  Lab 01/20/22 0112  AST 20  ALT 28  ALKPHOS 130*  BILITOT 0.3  PROT 6.8  ALBUMIN 1.8*   No results for input(s): "LIPASE", "AMYLASE" in the last 168 hours. No results for input(s): "AMMONIA" in the last 168 hours.  CBC: Recent Labs  Lab 01/15/22 0137 01/17/22 0127 01/20/22 0112  WBC 11.2* 12.2* 11.4*  HGB 8.7* 8.8* 8.2*  HCT 26.3* 26.9* 25.8*  MCV 100.8*  103.1* 102.4*  PLT 232 294 415*    Cardiac Enzymes: No results for input(s): "CKTOTAL", "CKMB", "CKMBINDEX", "TROPONINI" in the last 168 hours.  BNP (last 3 results) Recent Labs    12/22/21 1836 12/27/21 0424  BNP 15.2 110.4*    ProBNP (last 3 results) No results for input(s): "PROBNP" in the last 8760 hours.  Radiological Exams: DG Abd 1 View  Result Date: 01/20/2022 CLINICAL DATA:  Nasal/orogastric tube placement. EXAM: ABDOMEN - 1 VIEW COMPARISON:  01/19/2022. FINDINGS: Metal tip of the enteric tube projects in the right mid abdomen consistent with positioning in the distal stomach, similar to the prior exam. Normal bowel gas pattern. IMPRESSION: 1. Enteric feeding tube tip projects in the distal stomach, without significant change from the previous day's study. Electronically Signed   By: 01/21/2022 M.D.   On: 01/20/2022 11:29   DG Abd Portable 1V  Result Date: 01/19/2022 CLINICAL DATA:  Feeding tube placement, ileus EXAM: PORTABLE ABDOMEN - 1 VIEW COMPARISON:  01/08/2022 FINDINGS: Tip of feeding tube is seen in the distal antrum of the stomach. Bowel gas pattern is nonspecific. There is no significant small bowel dilation. No abnormal masses or calcifications are seen. Degenerative changes are noted in lumbar spine. Large infiltrate is seen in left mid and left lower lung fields. IMPRESSION: Nonspecific bowel  gas pattern. Tip of feeding tube is seen in the distal antrum of the stomach. There is large infiltrate in left lung suggesting atelectasis/pneumonia and possibly pleural effusion. Electronically Signed   By: Ernie Avena M.D.   On: 01/19/2022 13:36    Assessment/Plan Active Problems:   Pneumonia of left lung due to Pseudomonas species (HCC)   ARDS (adult respiratory distress syndrome) (HCC)   Acute on chronic respiratory failure with hypoxia (HCC)   Tracheostomy status (HCC)   Chronic pain syndrome   Acute on chronic respiratory failure hypoxia plan is going  to be to continue with the weaning process patient is going to be having a goal of 24 hours on T collar. ARDS supportive care limited one-sided probably secondary to aspiration slowly improving will consider follow-up CT scan of the chest Tracheostomy remains in place we will continue with supportive care Chronic pain syndrome pain management Pseudomonas pneumonia has been treated with antibiotics we will continue to follow   I have personally seen and evaluated the patient, evaluated laboratory and imaging results, formulated the assessment and plan and placed orders. The Patient requires high complexity decision making with multiple systems involvement.  Rounds were done with the Respiratory Therapy Director and Staff therapists and discussed with nursing staff also.  Yevonne Pax, MD [facility] Pulmonary Critical Care Medicine Sleep Medicine

## 2022-01-22 ENCOUNTER — Other Ambulatory Visit (HOSPITAL_COMMUNITY): Payer: Self-pay

## 2022-01-22 NOTE — Progress Notes (Signed)
Pulmonary Critical Care Medicine [facility] [place]   PULMONARY CRITICAL CARE SERVICE  PROGRESS NOTE     Janet Hensley  OEU:[id number]  DOB: Apr 12, 1970   DOA: 01/08/2022  Referring Physician: Luna Kitchens, MD  HPI: Janet Hensley is a 51 y.o. female being followed for ventilator/airway/oxygen weaning Acute on Chronic Respiratory Failure.  Patient is comfortable without distress and at this time no new changes are noted she is weaning on T collar and the goal is for 24 hours  Medications: Reviewed on Rounds  Physical Exam:  Vitals: Temperature 96 pulse 101 respiratory 18 blood pressure 127/76 saturation 99%  Ventilator Settings currently is on T collar FiO2 28%  General: Comfortable at this time Neck: supple Cardiovascular: no malignant arrhythmias Respiratory: Scattered rhonchi expansion equal Skin: no rash seen on limited exam Musculoskeletal: No gross abnormality Psychiatric:unable to assess Neurologic:no involuntary movements         Lab Data:   Basic Metabolic Panel: Recent Labs  Lab 01/17/22 0127 01/20/22 0112 01/21/22 0154  NA 137 135  --   K 4.5 5.2* 4.2  CL 107 102  --   CO2 23 23  --   GLUCOSE 117* 132*  --   BUN 19 27*  --   CREATININE 0.49 0.86  --   CALCIUM 9.5 9.9  --   MG 1.8  --   --     ABG: No results for input(s): "PHART", "PCO2ART", "PO2ART", "HCO3", "O2SAT" in the last 168 hours.  Liver Function Tests: Recent Labs  Lab 01/20/22 0112  AST 20  ALT 28  ALKPHOS 130*  BILITOT 0.3  PROT 6.8  ALBUMIN 1.8*   No results for input(s): "LIPASE", "AMYLASE" in the last 168 hours. No results for input(s): "AMMONIA" in the last 168 hours.  CBC: Recent Labs  Lab 01/17/22 0127 01/20/22 0112  WBC 12.2* 11.4*  HGB 8.8* 8.2*  HCT 26.9* 25.8*  MCV 103.1* 102.4*  PLT 294 415*    Cardiac Enzymes: No results for input(s): "CKTOTAL", "CKMB", "CKMBINDEX", "TROPONINI" in the last 168 hours.  BNP (last 3 results) Recent  Labs    12/22/21 1836 12/27/21 0424  BNP 15.2 110.4*    ProBNP (last 3 results) No results for input(s): "PROBNP" in the last 8760 hours.  Radiological Exams: DG Abd 1 View  Result Date: 01/20/2022 CLINICAL DATA:  Nasal/orogastric tube placement. EXAM: ABDOMEN - 1 VIEW COMPARISON:  01/19/2022. FINDINGS: Metal tip of the enteric tube projects in the right mid abdomen consistent with positioning in the distal stomach, similar to the prior exam. Normal bowel gas pattern. IMPRESSION: 1. Enteric feeding tube tip projects in the distal stomach, without significant change from the previous day's study. Electronically Signed   By: Amie Portland M.D.   On: 01/20/2022 11:29    Assessment/Plan Active Problems:   Pneumonia of left lung due to Pseudomonas species (HCC)   ARDS (adult respiratory distress syndrome) (HCC)   Acute on chronic respiratory failure with hypoxia (HCC)   Tracheostomy status (HCC)   Chronic pain syndrome   Acute on chronic respiratory failure hypoxia will continue to wean on T-piece goal of 24 hours ARDS improving Pseudomonas pneumonia treated improving will continue to monitor Tracheostomy remains in place for vent management Chronic pain syndrome pain management supportive care   I have personally seen and evaluated the patient, evaluated laboratory and imaging results, formulated the assessment and plan and placed orders. The Patient requires high complexity decision making with multiple systems  involvement.  Rounds were done with the Respiratory Therapy Director and Staff therapists and discussed with nursing staff also.  Allyne Gee, MD [facility] Pulmonary Critical Care Medicine Sleep Medicine

## 2022-01-23 LAB — CULTURE, BLOOD (ROUTINE X 2)
Culture: NO GROWTH
Culture: NO GROWTH

## 2022-01-24 LAB — BASIC METABOLIC PANEL
Anion gap: 13 (ref 5–15)
BUN: 29 mg/dL — ABNORMAL HIGH (ref 6–20)
CO2: 23 mmol/L (ref 22–32)
Calcium: 10 mg/dL (ref 8.9–10.3)
Chloride: 102 mmol/L (ref 98–111)
Creatinine, Ser: 0.74 mg/dL (ref 0.44–1.00)
GFR, Estimated: >60 mL/min (ref >60)
Glucose, Bld: 137 mg/dL — ABNORMAL HIGH (ref 70–99)
Potassium: 4.1 mmol/L (ref 3.5–5.1)
Sodium: 138 mmol/L (ref 135–145)

## 2022-01-24 LAB — CBC
HCT: 28 % — ABNORMAL LOW (ref 36.0–46.0)
Hemoglobin: 8.5 g/dL — ABNORMAL LOW (ref 12.0–15.0)
MCH: 31.4 pg (ref 26.0–34.0)
MCHC: 30.4 g/dL (ref 30.0–36.0)
MCV: 103.3 fL — ABNORMAL HIGH (ref 80.0–100.0)
Platelets: 491 10*3/uL — ABNORMAL HIGH (ref 150–400)
RBC: 2.71 MIL/uL — ABNORMAL LOW (ref 3.87–5.11)
RDW: 15.7 % — ABNORMAL HIGH (ref 11.5–15.5)
WBC: 13.8 10*3/uL — ABNORMAL HIGH (ref 4.0–10.5)
nRBC: 0 % (ref 0.0–0.2)

## 2022-01-24 LAB — BLOOD GAS, ARTERIAL
Acid-Base Excess: 0.1 mmol/L (ref 0.0–2.0)
Bicarbonate: 24.7 mmol/L (ref 20.0–28.0)
Drawn by: 164
O2 Saturation: 96.6 %
Patient temperature: 37
pCO2 arterial: 39 mmHg (ref 32–48)
pH, Arterial: 7.41 (ref 7.35–7.45)
pO2, Arterial: 82 mmHg — ABNORMAL LOW (ref 83–108)

## 2022-01-24 LAB — MAGNESIUM: Magnesium: 1.8 mg/dL (ref 1.7–2.4)

## 2022-01-24 NOTE — Progress Notes (Addendum)
Pulmonary Critical Care Medicine [facility] [place]   PULMONARY CRITICAL CARE SERVICE  PROGRESS NOTE     Janet Hensley  [id number]  DOB: Atiya 15, 1972   DOA: 01/08/2022  Referring Physician: Luna Kitchens, MD  HPI: Janet Hensley is a 51 y.o. female being followed for ventilator/airway/oxygen weaning Acute on Chronic Respiratory Failure.  Appears to be comfortable without any distress.  Trying to advance the weaning for 24 hours  Medications: Reviewed on Rounds  Physical Exam:  Vitals: Temp 99.6, pulse 96, respirations 25, BP 116/80, SpO2 98%  Ventilator Settings ATC 28%  General: Comfortable at this time Neck: supple Cardiovascular: no malignant arrhythmias Respiratory: Bilaterally diminished Skin: no rash seen on limited exam Musculoskeletal: No gross abnormality Psychiatric:unable to assess Neurologic:no involuntary movements         Lab Data:   Basic Metabolic Panel: Recent Labs  Lab 01/20/22 0112 01/21/22 0154 01/24/22 0349  NA 135  --  138  K 5.2* 4.2 4.1  CL 102  --  102  CO2 23  --  23  GLUCOSE 132*  --  137*  BUN 27*  --  29*  CREATININE 0.86  --  0.74  CALCIUM 9.9  --  10.0  MG  --   --  1.8    ABG: No results for input(s): "PHART", "PCO2ART", "PO2ART", "HCO3", "O2SAT" in the last 168 hours.  Liver Function Tests: Recent Labs  Lab 01/20/22 0112  AST 20  ALT 28  ALKPHOS 130*  BILITOT 0.3  PROT 6.8  ALBUMIN 1.8*   No results for input(s): "LIPASE", "AMYLASE" in the last 168 hours. No results for input(s): "AMMONIA" in the last 168 hours.  CBC: Recent Labs  Lab 01/20/22 0112 01/24/22 0349  WBC 11.4* 13.8*  HGB 8.2* 8.5*  HCT 25.8* 28.0*  MCV 102.4* 103.3*  PLT 415* 491*    Cardiac Enzymes: No results for input(s): "CKTOTAL", "CKMB", "CKMBINDEX", "TROPONINI" in the last 168 hours.  BNP (last 3 results) Recent Labs    12/22/21 1836 12/27/21 0424  BNP 15.2 110.4*    ProBNP (last 3 results) No results for  input(s): "PROBNP" in the last 8760 hours.  Radiological Exams: No results found.  Assessment/Plan Active Problems:   Pneumonia of left lung due to Pseudomonas species (HCC)   ARDS (adult respiratory distress syndrome) (HCC)   Acute on chronic respiratory failure with hypoxia (HCC)   Tracheostomy status (HCC)   Chronic pain syndrome   Acute on chronic respiratory failure hypoxia will continue to wean on T-piece reattempt goal of 24 hours today ARDS improving Pseudomonas pneumonia treated improving will continue to monitor Tracheostomy remains in place for vent management Chronic pain syndrome pain management supportive care   I have personally seen and evaluated the patient, evaluated laboratory and imaging results, formulated the assessment and plan and placed orders. The Patient requires high complexity decision making with multiple systems involvement.  Rounds were done with the Respiratory Therapy Director and Staff therapists and discussed with nursing staff also.  Yevonne Pax, MD [facility] Pulmonary Critical Care Medicine Sleep Medicine

## 2022-01-24 NOTE — Progress Notes (Addendum)
Pulmonary Critical Care Medicine [facility] [place]   PULMONARY CRITICAL CARE SERVICE  PROGRESS NOTE     Janet Hensley  [id number]  DOB: 10/10/1970   DOA: 01/08/2022  Referring Physician: Luna Kitchens, MD  HPI: Janet Hensley is a 51 y.o. female being followed for ventilator/airway/oxygen weaning Acute on Chronic Respiratory Failure.  Patient seen sitting up in chair, currently on ATC.  Yesterday was trialing 24-hour ATC goal attempt where she became tachypneic, bradycardic and started desatting requiring her to be placed back on full support ventilation.  She is currently reattempting a 24-hour ATC goal at this time.  Medications: Reviewed on Rounds  Physical Exam:  Vitals: Temp 98.6, pulse 119, respirations 15, BP 124/75, SpO2 99%  Ventilator Settings ATC 28%  General: Comfortable at this time Neck: supple Cardiovascular: no malignant arrhythmias Respiratory: Bilaterally diminished Skin: no rash seen on limited exam Musculoskeletal: No gross abnormality Psychiatric:unable to assess Neurologic:no involuntary movements         Lab Data:   Basic Metabolic Panel: Recent Labs  Lab 01/20/22 0112 01/21/22 0154 01/24/22 0349  NA 135  --  138  K 5.2* 4.2 4.1  CL 102  --  102  CO2 23  --  23  GLUCOSE 132*  --  137*  BUN 27*  --  29*  CREATININE 0.86  --  0.74  CALCIUM 9.9  --  10.0  MG  --   --  1.8    ABG: No results for input(s): "PHART", "PCO2ART", "PO2ART", "HCO3", "O2SAT" in the last 168 hours.  Liver Function Tests: Recent Labs  Lab 01/20/22 0112  AST 20  ALT 28  ALKPHOS 130*  BILITOT 0.3  PROT 6.8  ALBUMIN 1.8*   No results for input(s): "LIPASE", "AMYLASE" in the last 168 hours. No results for input(s): "AMMONIA" in the last 168 hours.  CBC: Recent Labs  Lab 01/20/22 0112 01/24/22 0349  WBC 11.4* 13.8*  HGB 8.2* 8.5*  HCT 25.8* 28.0*  MCV 102.4* 103.3*  PLT 415* 491*    Cardiac Enzymes: No results for input(s):  "CKTOTAL", "CKMB", "CKMBINDEX", "TROPONINI" in the last 168 hours.  BNP (last 3 results) Recent Labs    12/22/21 1836 12/27/21 0424  BNP 15.2 110.4*    ProBNP (last 3 results) No results for input(s): "PROBNP" in the last 8760 hours.  Radiological Exams: No results found.  Assessment/Plan Active Problems:   Pneumonia of left lung due to Pseudomonas species (HCC)   ARDS (adult respiratory distress syndrome) (HCC)   Acute on chronic respiratory failure with hypoxia (HCC)   Tracheostomy status (HCC)   Chronic pain syndrome   Acute on chronic respiratory failure hypoxia will continue to wean on T-piece reattempt goal of 24 hours today ARDS improving Pseudomonas pneumonia treated improving will continue to monitor Tracheostomy remains in place for vent management Chronic pain syndrome pain management supportive care   I have personally seen and evaluated the patient, evaluated laboratory and imaging results, formulated the assessment and plan and placed orders. The Patient requires high complexity decision making with multiple systems involvement.  Rounds were done with the Respiratory Therapy Director and Staff therapists and discussed with nursing staff also.  Janet Pax, MD [facility] Pulmonary Critical Care Medicine Sleep Medicine

## 2022-01-25 NOTE — Progress Notes (Signed)
Pulmonary Critical Care Medicine [facility] [place]   PULMONARY CRITICAL CARE SERVICE  PROGRESS NOTE     Lacy C Grinnell  [id number]  DOB: [birth year]   DOA: 01/08/2022  Referring Physician: Luna Kitchens, MD  HPI: Janet Hensley is a 51 y.o. female being followed for ventilator/airway/oxygen weaning Acute on Chronic Respiratory Failure.  Patient is on T collar currently on 28% FiO2  Medications: Reviewed on Rounds  Physical Exam:  Vitals: Temperature is 97.7 pulse 104 respiratory rate is 28 blood pressure 102/52 saturation 98%  Ventilator Settings off the ventilator on T collar 28%  General: Comfortable at this time Neck: supple Cardiovascular: no malignant arrhythmias Respiratory: No rhonchi no rales are noted at this time Skin: no rash seen on limited exam Musculoskeletal: No gross abnormality Psychiatric:unable to assess Neurologic:no involuntary movements         Lab Data:   Basic Metabolic Panel: Recent Labs  Lab 01/20/22 0112 01/21/22 0154 01/24/22 0349  NA 135  --  138  K 5.2* 4.2 4.1  CL 102  --  102  CO2 23  --  23  GLUCOSE 132*  --  137*  BUN 27*  --  29*  CREATININE 0.86  --  0.74  CALCIUM 9.9  --  10.0  MG  --   --  1.8    ABG: Recent Labs  Lab 01/24/22 1600  PHART 7.41  PCO2ART 39  PO2ART 82*  HCO3 24.7  O2SAT 96.6    Liver Function Tests: Recent Labs  Lab 01/20/22 0112  AST 20  ALT 28  ALKPHOS 130*  BILITOT 0.3  PROT 6.8  ALBUMIN 1.8*   No results for input(s): "LIPASE", "AMYLASE" in the last 168 hours. No results for input(s): "AMMONIA" in the last 168 hours.  CBC: Recent Labs  Lab 01/20/22 0112 01/24/22 0349  WBC 11.4* 13.8*  HGB 8.2* 8.5*  HCT 25.8* 28.0*  MCV 102.4* 103.3*  PLT 415* 491*    Cardiac Enzymes: No results for input(s): "CKTOTAL", "CKMB", "CKMBINDEX", "TROPONINI" in the last 168 hours.  BNP (last 3 results) Recent Labs    12/22/21 1836 12/27/21 0424  BNP 15.2 110.4*     ProBNP (last 3 results) No results for input(s): "PROBNP" in the last 8760 hours.  Radiological Exams: No results found.  Assessment/Plan Active Problems:   Pneumonia of left lung due to Pseudomonas species (HCC)   ARDS (adult respiratory distress syndrome) (HCC)   Acute on chronic respiratory failure with hypoxia (HCC)   Tracheostomy status (HCC)   Chronic pain syndrome   Acute on chronic respiratory failure with hypoxia completed 24 hours going for 48 hours to be completed today ARDS improving continue to monitor closely. Tracheostomy remains in place Chronic pain syndrome pain management Pseudomonas pneumonia will continue to monitor by x-ray follow-up   I have personally seen and evaluated the patient, evaluated laboratory and imaging results, formulated the assessment and plan and placed orders. The Patient requires high complexity decision making with multiple systems involvement.  Rounds were done with the Respiratory Therapy Director and Staff therapists and discussed with nursing staff also.  Yevonne Pax, MD [facility] Pulmonary Critical Care Medicine Sleep Medicine

## 2022-01-26 ENCOUNTER — Other Ambulatory Visit (HOSPITAL_COMMUNITY): Payer: Self-pay

## 2022-01-26 NOTE — Progress Notes (Signed)
Pulmonary Critical Care Medicine [facility] [place]   PULMONARY CRITICAL CARE SERVICE  PROGRESS NOTE     Janet Hensley  [id number]  DOB: 1970-02-26   DOA: 01/08/2022  Referring Physician: Luna Kitchens, MD  HPI: Janet Hensley is a 51 y.o. female being followed for ventilator/airway/oxygen weaning Acute on Chronic Respiratory Failure.  Patient is on T collar currently on 28% FiO2 should be ready for capping  Medications: Reviewed on Rounds  Physical Exam:  Vitals: Temperature is 98.5 pulse 109 respiratory rate is 26 blood pressure 115/65 saturation is 97%  Ventilator Settings on T collar FiO2 is 28%  General: Comfortable at this time Neck: supple Cardiovascular: no malignant arrhythmias Respiratory: No rhonchi no rales are noted at this time Skin: no rash seen on limited exam Musculoskeletal: No gross abnormality Psychiatric:unable to assess Neurologic:no involuntary movements         Lab Data:   Basic Metabolic Panel: Recent Labs  Lab 01/20/22 0112 01/21/22 0154 01/24/22 0349  NA 135  --  138  K 5.2* 4.2 4.1  CL 102  --  102  CO2 23  --  23  GLUCOSE 132*  --  137*  BUN 27*  --  29*  CREATININE 0.86  --  0.74  CALCIUM 9.9  --  10.0  MG  --   --  1.8    ABG: Recent Labs  Lab 01/24/22 1600  PHART 7.41  PCO2ART 39  PO2ART 82*  HCO3 24.7  O2SAT 96.6    Liver Function Tests: Recent Labs  Lab 01/20/22 0112  AST 20  ALT 28  ALKPHOS 130*  BILITOT 0.3  PROT 6.8  ALBUMIN 1.8*   No results for input(s): "LIPASE", "AMYLASE" in the last 168 hours. No results for input(s): "AMMONIA" in the last 168 hours.  CBC: Recent Labs  Lab 01/20/22 0112 01/24/22 0349  WBC 11.4* 13.8*  HGB 8.2* 8.5*  HCT 25.8* 28.0*  MCV 102.4* 103.3*  PLT 415* 491*    Cardiac Enzymes: No results for input(s): "CKTOTAL", "CKMB", "CKMBINDEX", "TROPONINI" in the last 168 hours.  BNP (last 3 results) Recent Labs    12/22/21 1836 12/27/21 0424  BNP  15.2 110.4*    ProBNP (last 3 results) No results for input(s): "PROBNP" in the last 8760 hours.  Radiological Exams: DG CHEST PORT 1 VIEW  Result Date: 01/26/2022 CLINICAL DATA:  51 year old female with history of pneumonia. EXAM: PORTABLE CHEST 1 VIEW COMPARISON:  Chest x-ray 01/22/2022. FINDINGS: A tracheostomy tube is in place with tip 4.4 cm above the carina. A feeding tube is seen extending into the abdomen, however, the tip of the feeding tube extends below the lower margin of the image. Extensive airspace consolidation again noted throughout the left lung. Small left pleural effusion. Right lung is clear. No definite right pleural effusion. No pneumothorax. No evidence of pulmonary edema. Cardiac silhouette is obscured. IMPRESSION: 1. Support apparatus, as above. 2. Persistent severe left-sided pneumonia, grossly unchanged. 3. Small left pleural effusion. Electronically Signed   By: Trudie Reed M.D.   On: 01/26/2022 06:57    Assessment/Plan Active Problems:   Pneumonia of left lung due to Pseudomonas species (HCC)   ARDS (adult respiratory distress syndrome) (HCC)   Acute on chronic respiratory failure with hypoxia (HCC)   Tracheostomy status (HCC)   Chronic pain syndrome   Acute on chronic respiratory failure hypoxia will continue with the weaning process start with capping trials after the trach is changed out  to a cuffless ARDS no change will continue to monitor she is clinically improved Pneumonia due to Pseudomonas supportive care Tracheostomy remains in place Chronic pain syndrome pain management   I have personally seen and evaluated the patient, evaluated laboratory and imaging results, formulated the assessment and plan and placed orders. The Patient requires high complexity decision making with multiple systems involvement.  Rounds were done with the Respiratory Therapy Director and Staff therapists and discussed with nursing staff also.  Allyne Gee, MD  [facility] Pulmonary Critical Care Medicine Sleep Medicine

## 2022-01-27 ENCOUNTER — Other Ambulatory Visit (HOSPITAL_COMMUNITY): Payer: Self-pay

## 2022-01-27 DIAGNOSIS — D649 Anemia, unspecified: Secondary | ICD-10-CM | POA: Diagnosis not present

## 2022-01-27 DIAGNOSIS — J9621 Acute and chronic respiratory failure with hypoxia: Secondary | ICD-10-CM | POA: Diagnosis not present

## 2022-01-27 DIAGNOSIS — R1310 Dysphagia, unspecified: Secondary | ICD-10-CM | POA: Diagnosis not present

## 2022-01-27 DIAGNOSIS — J9600 Acute respiratory failure, unspecified whether with hypoxia or hypercapnia: Secondary | ICD-10-CM | POA: Diagnosis not present

## 2022-01-27 DIAGNOSIS — J151 Pneumonia due to Pseudomonas: Secondary | ICD-10-CM | POA: Diagnosis not present

## 2022-01-27 DIAGNOSIS — J80 Acute respiratory distress syndrome: Secondary | ICD-10-CM | POA: Diagnosis not present

## 2022-01-27 DIAGNOSIS — I2699 Other pulmonary embolism without acute cor pulmonale: Secondary | ICD-10-CM

## 2022-01-27 DIAGNOSIS — E46 Unspecified protein-calorie malnutrition: Secondary | ICD-10-CM | POA: Diagnosis not present

## 2022-01-27 DIAGNOSIS — J449 Chronic obstructive pulmonary disease, unspecified: Secondary | ICD-10-CM | POA: Diagnosis not present

## 2022-01-27 DIAGNOSIS — D72829 Elevated white blood cell count, unspecified: Secondary | ICD-10-CM | POA: Diagnosis not present

## 2022-01-27 DIAGNOSIS — J189 Pneumonia, unspecified organism: Secondary | ICD-10-CM | POA: Diagnosis not present

## 2022-01-27 DIAGNOSIS — G894 Chronic pain syndrome: Secondary | ICD-10-CM | POA: Diagnosis not present

## 2022-01-27 DIAGNOSIS — Z930 Tracheostomy status: Secondary | ICD-10-CM | POA: Diagnosis not present

## 2022-01-27 HISTORY — DX: Other pulmonary embolism without acute cor pulmonale: I26.99

## 2022-01-27 LAB — BASIC METABOLIC PANEL
Anion gap: 12 (ref 5–15)
BUN: 25 mg/dL — ABNORMAL HIGH (ref 6–20)
CO2: 21 mmol/L — ABNORMAL LOW (ref 22–32)
Calcium: 10.9 mg/dL — ABNORMAL HIGH (ref 8.9–10.3)
Chloride: 101 mmol/L (ref 98–111)
Creatinine, Ser: 0.8 mg/dL (ref 0.44–1.00)
GFR, Estimated: >60 mL/min (ref >60)
Glucose, Bld: 142 mg/dL — ABNORMAL HIGH (ref 70–99)
Potassium: 4 mmol/L (ref 3.5–5.1)
Sodium: 134 mmol/L — ABNORMAL LOW (ref 135–145)

## 2022-01-27 LAB — CBC
HCT: 26 % — ABNORMAL LOW (ref 36.0–46.0)
Hemoglobin: 8.2 g/dL — ABNORMAL LOW (ref 12.0–15.0)
MCH: 31.9 pg (ref 26.0–34.0)
MCHC: 31.5 g/dL (ref 30.0–36.0)
MCV: 101.2 fL — ABNORMAL HIGH (ref 80.0–100.0)
Platelets: 462 10*3/uL — ABNORMAL HIGH (ref 150–400)
RBC: 2.57 MIL/uL — ABNORMAL LOW (ref 3.87–5.11)
RDW: 15.4 % (ref 11.5–15.5)
WBC: 17.5 10*3/uL — ABNORMAL HIGH (ref 4.0–10.5)
nRBC: 0 % (ref 0.0–0.2)

## 2022-01-27 LAB — MAGNESIUM: Magnesium: 1.9 mg/dL (ref 1.7–2.4)

## 2022-01-27 LAB — CULTURE, RESPIRATORY W GRAM STAIN

## 2022-01-28 DIAGNOSIS — D649 Anemia, unspecified: Secondary | ICD-10-CM | POA: Diagnosis not present

## 2022-01-28 DIAGNOSIS — J189 Pneumonia, unspecified organism: Secondary | ICD-10-CM | POA: Diagnosis not present

## 2022-01-28 DIAGNOSIS — J80 Acute respiratory distress syndrome: Secondary | ICD-10-CM | POA: Diagnosis not present

## 2022-01-28 DIAGNOSIS — J449 Chronic obstructive pulmonary disease, unspecified: Secondary | ICD-10-CM | POA: Diagnosis not present

## 2022-01-28 DIAGNOSIS — J151 Pneumonia due to Pseudomonas: Secondary | ICD-10-CM | POA: Diagnosis not present

## 2022-01-28 DIAGNOSIS — D72829 Elevated white blood cell count, unspecified: Secondary | ICD-10-CM | POA: Diagnosis not present

## 2022-01-28 DIAGNOSIS — E46 Unspecified protein-calorie malnutrition: Secondary | ICD-10-CM | POA: Diagnosis not present

## 2022-01-28 DIAGNOSIS — J9621 Acute and chronic respiratory failure with hypoxia: Secondary | ICD-10-CM | POA: Diagnosis not present

## 2022-01-28 DIAGNOSIS — R1310 Dysphagia, unspecified: Secondary | ICD-10-CM | POA: Diagnosis not present

## 2022-01-28 DIAGNOSIS — G894 Chronic pain syndrome: Secondary | ICD-10-CM | POA: Diagnosis not present

## 2022-01-28 DIAGNOSIS — J9600 Acute respiratory failure, unspecified whether with hypoxia or hypercapnia: Secondary | ICD-10-CM | POA: Diagnosis not present

## 2022-01-28 DIAGNOSIS — Z930 Tracheostomy status: Secondary | ICD-10-CM | POA: Diagnosis not present

## 2022-01-29 ENCOUNTER — Other Ambulatory Visit (HOSPITAL_COMMUNITY): Payer: Self-pay

## 2022-01-29 DIAGNOSIS — J9621 Acute and chronic respiratory failure with hypoxia: Secondary | ICD-10-CM | POA: Diagnosis not present

## 2022-01-29 DIAGNOSIS — D649 Anemia, unspecified: Secondary | ICD-10-CM | POA: Diagnosis not present

## 2022-01-29 DIAGNOSIS — J80 Acute respiratory distress syndrome: Secondary | ICD-10-CM | POA: Diagnosis not present

## 2022-01-29 DIAGNOSIS — J151 Pneumonia due to Pseudomonas: Secondary | ICD-10-CM | POA: Diagnosis not present

## 2022-01-29 DIAGNOSIS — G894 Chronic pain syndrome: Secondary | ICD-10-CM | POA: Diagnosis not present

## 2022-01-29 DIAGNOSIS — J9600 Acute respiratory failure, unspecified whether with hypoxia or hypercapnia: Secondary | ICD-10-CM | POA: Diagnosis not present

## 2022-01-29 DIAGNOSIS — R1310 Dysphagia, unspecified: Secondary | ICD-10-CM | POA: Diagnosis not present

## 2022-01-29 DIAGNOSIS — J189 Pneumonia, unspecified organism: Secondary | ICD-10-CM | POA: Diagnosis not present

## 2022-01-29 DIAGNOSIS — D72829 Elevated white blood cell count, unspecified: Secondary | ICD-10-CM | POA: Diagnosis not present

## 2022-01-29 DIAGNOSIS — E46 Unspecified protein-calorie malnutrition: Secondary | ICD-10-CM | POA: Diagnosis not present

## 2022-01-29 DIAGNOSIS — Z930 Tracheostomy status: Secondary | ICD-10-CM | POA: Diagnosis not present

## 2022-01-29 DIAGNOSIS — J449 Chronic obstructive pulmonary disease, unspecified: Secondary | ICD-10-CM | POA: Diagnosis not present

## 2022-01-29 LAB — URINALYSIS, ROUTINE W REFLEX MICROSCOPIC
Bilirubin Urine: NEGATIVE
Glucose, UA: 50 mg/dL — AB
Hgb urine dipstick: NEGATIVE
Ketones, ur: NEGATIVE mg/dL
Nitrite: NEGATIVE
Protein, ur: 100 mg/dL — AB
Specific Gravity, Urine: 1.012 (ref 1.005–1.030)
pH: 7 (ref 5.0–8.0)

## 2022-01-29 LAB — CBC
HCT: 28.4 % — ABNORMAL LOW (ref 36.0–46.0)
Hemoglobin: 8.5 g/dL — ABNORMAL LOW (ref 12.0–15.0)
MCH: 31.1 pg (ref 26.0–34.0)
MCHC: 29.9 g/dL — ABNORMAL LOW (ref 30.0–36.0)
MCV: 104 fL — ABNORMAL HIGH (ref 80.0–100.0)
Platelets: 551 10*3/uL — ABNORMAL HIGH (ref 150–400)
RBC: 2.73 MIL/uL — ABNORMAL LOW (ref 3.87–5.11)
RDW: 15.5 % (ref 11.5–15.5)
WBC: 20 10*3/uL — ABNORMAL HIGH (ref 4.0–10.5)
nRBC: 0 % (ref 0.0–0.2)

## 2022-01-29 LAB — BASIC METABOLIC PANEL
Anion gap: 8 (ref 5–15)
BUN: 19 mg/dL (ref 6–20)
CO2: 23 mmol/L (ref 22–32)
Calcium: 11.3 mg/dL — ABNORMAL HIGH (ref 8.9–10.3)
Chloride: 102 mmol/L (ref 98–111)
Creatinine, Ser: 0.65 mg/dL (ref 0.44–1.00)
GFR, Estimated: >60 mL/min (ref >60)
Glucose, Bld: 142 mg/dL — ABNORMAL HIGH (ref 70–99)
Potassium: 4.2 mmol/L (ref 3.5–5.1)
Sodium: 133 mmol/L — ABNORMAL LOW (ref 135–145)

## 2022-01-30 DIAGNOSIS — J9600 Acute respiratory failure, unspecified whether with hypoxia or hypercapnia: Secondary | ICD-10-CM | POA: Diagnosis not present

## 2022-01-30 DIAGNOSIS — D72829 Elevated white blood cell count, unspecified: Secondary | ICD-10-CM | POA: Diagnosis not present

## 2022-01-30 DIAGNOSIS — J449 Chronic obstructive pulmonary disease, unspecified: Secondary | ICD-10-CM | POA: Diagnosis not present

## 2022-01-30 DIAGNOSIS — R1310 Dysphagia, unspecified: Secondary | ICD-10-CM | POA: Diagnosis not present

## 2022-01-30 DIAGNOSIS — E46 Unspecified protein-calorie malnutrition: Secondary | ICD-10-CM | POA: Diagnosis not present

## 2022-01-30 DIAGNOSIS — J189 Pneumonia, unspecified organism: Secondary | ICD-10-CM | POA: Diagnosis not present

## 2022-01-30 DIAGNOSIS — D649 Anemia, unspecified: Secondary | ICD-10-CM | POA: Diagnosis not present

## 2022-01-30 LAB — URINE CULTURE

## 2022-01-30 NOTE — Progress Notes (Signed)
Pulmonary [place]  PROGRESS NOTE     Janet Hensley  ZOX:[id number]  DOB: [birth year]   DOA: 01/08/2022  Referring Physician: Satira Sark, MD  HPI: Janet Hensley is a 52 y.o. female being followed for ventilator/airway/oxygen weaning Acute on Chronic Respiratory Failure.  She is doing well with capping going to hold off for 1 more day before we decide to decannulate  Medications: Reviewed on Rounds  Physical Exam:  Vitals: Temperature is 96.9 pulse 108 respiratory 28 blood pressure 100/54 saturation is 96%  Ventilator Settings capping off the ventilator  General: Comfortable at this time Neck: supple Cardiovascular: no malignant arrhythmias Respiratory: No rhonchi no rales are noted Skin: no rash seen on limited exam Musculoskeletal: No gross abnormality Psychiatric:unable to assess Neurologic:no involuntary movements         Lab Data:   Basic Metabolic Panel: Recent Labs  Lab 01/24/22 0349 01/27/22 0127 01/29/22 0123  NA 138 134* 133*  K 4.1 4.0 4.2  CL 102 101 102  CO2 23 21* 23  GLUCOSE 137* 142* 142*  BUN 29* 25* 19  CREATININE 0.74 0.80 0.65  CALCIUM 10.0 10.9* 11.3*  MG 1.8 1.9  --     ABG: Recent Labs  Lab 01/24/22 1600  PHART 7.41  PCO2ART 39  PO2ART 82*  HCO3 24.7  O2SAT 96.6    Liver Function Tests: No results for input(s): "AST", "ALT", "ALKPHOS", "BILITOT", "PROT", "ALBUMIN" in the last 168 hours. No results for input(s): "LIPASE", "AMYLASE" in the last 168 hours. No results for input(s): "AMMONIA" in the last 168 hours.  CBC: Recent Labs  Lab 01/24/22 0349 01/27/22 0127 01/29/22 0122  WBC 13.8* 17.5* 20.0*  HGB 8.5* 8.2* 8.5*  HCT 28.0* 26.0* 28.4*  MCV 103.3* 101.2* 104.0*  PLT 491* 462* 551*    Cardiac Enzymes: No results for input(s): "CKTOTAL", "CKMB", "CKMBINDEX", "TROPONINI" in the last 168 hours.  BNP (last 3 results) Recent Labs     12/22/21 1836 12/27/21 0424  BNP 15.2 110.4*    ProBNP (last 3 results) No results for input(s): "PROBNP" in the last 8760 hours.  Radiological Exams: DG Chest Port 1 View  Result Date: 01/29/2022 CLINICAL DATA:  Pneumonia. EXAM: PORTABLE CHEST 1 VIEW COMPARISON:  January 26, 2022. FINDINGS: Tracheostomy is unchanged. Stable cardiomediastinal silhouette. Stable left upper and lower lobe airspace opacities are noted consistent with pneumonia. Bony thorax is unremarkable. IMPRESSION: Stable large left lung opacity consistent with pneumonia. Electronically Signed   By: Marijo Conception M.D.   On: 01/29/2022 14:15    Assessment/Plan Active Problems:   Pneumonia of left lung due to Pseudomonas species (HCC)   ARDS (adult respiratory distress syndrome) (HCC)   Acute on chronic respiratory failure with hypoxia (HCC)   Tracheostomy status (HCC)   Chronic pain syndrome   Acute on chronic respiratory failure with hypoxia patient had been doing well with capping trials good strong cough.  There was some concern about some aspiration during swallowing assessment so I am holding off on decannulation right now.  Chest x-ray that was done had shown some stable opacity in the left lung which has been there since the beginning of her admission ARDS no change will continue with supportive care clinically is better Tracheostomy remains in place Pseudomonas pneumonia has been treated we will continue to follow along Chronic pain syndrome pain management   I have personally seen and evaluated the patient,  evaluated laboratory and imaging results, formulated the assessment and plan and placed orders. The Patient requires high complexity decision making with multiple systems involvement.  Rounds were done with the Respiratory Therapy Director and Staff therapists and discussed with nursing staff also.  Allyne Gee, MD [facility] Pulmonary Critical Care Medicine Sleep Medicine

## 2022-01-30 NOTE — Progress Notes (Addendum)
Pulmonary [place]  PROGRESS NOTE     Janet Hensley  ACZ:[id number]  DOB: [birth year]   DOA: 01/08/2022  Referring Physician: Satira Sark, MD  HPI: Janet Hensley is a 52 y.o. female being followed for ventilator/airway/oxygen weaning Acute on Chronic Respiratory Failure.  Patient seen lying in bed, currently capped on room air, working towards 72-hour goal.  She does have a decent amount of secretions present today.  Medications: Reviewed on Rounds  Physical Exam:  Vitals: Temp 97.2, pulse 120, respirations 42, BP 136/80, SpO2 95%  Ventilator Settings capped and on room air  General: Comfortable at this time Neck: supple Cardiovascular: no malignant arrhythmias Respiratory: Bilaterally coarse Skin: no rash seen on limited exam Musculoskeletal: No gross abnormality Psychiatric:unable to assess Neurologic:no involuntary movements         Lab Data:   Basic Metabolic Panel: Recent Labs  Lab 01/24/22 0349 01/27/22 0127 01/29/22 0123  NA 138 134* 133*  K 4.1 4.0 4.2  CL 102 101 102  CO2 23 21* 23  GLUCOSE 137* 142* 142*  BUN 29* 25* 19  CREATININE 0.74 0.80 0.65  CALCIUM 10.0 10.9* 11.3*  MG 1.8 1.9  --     ABG: Recent Labs  Lab 01/24/22 1600  PHART 7.41  PCO2ART 39  PO2ART 82*  HCO3 24.7  O2SAT 96.6    Liver Function Tests: No results for input(s): "AST", "ALT", "ALKPHOS", "BILITOT", "PROT", "ALBUMIN" in the last 168 hours. No results for input(s): "LIPASE", "AMYLASE" in the last 168 hours. No results for input(s): "AMMONIA" in the last 168 hours.  CBC: Recent Labs  Lab 01/24/22 0349 01/27/22 0127 01/29/22 0122  WBC 13.8* 17.5* 20.0*  HGB 8.5* 8.2* 8.5*  HCT 28.0* 26.0* 28.4*  MCV 103.3* 101.2* 104.0*  PLT 491* 462* 551*    Cardiac Enzymes: No results for input(s): "CKTOTAL", "CKMB", "CKMBINDEX", "TROPONINI" in the last 168 hours.  BNP (last 3  results) Recent Labs    12/22/21 1836 12/27/21 0424  BNP 15.2 110.4*    ProBNP (last 3 results) No results for input(s): "PROBNP" in the last 8760 hours.  Radiological Exams: DG Chest Port 1 View  Result Date: 01/29/2022 CLINICAL DATA:  Pneumonia. EXAM: PORTABLE CHEST 1 VIEW COMPARISON:  January 26, 2022. FINDINGS: Tracheostomy is unchanged. Stable cardiomediastinal silhouette. Stable left upper and lower lobe airspace opacities are noted consistent with pneumonia. Bony thorax is unremarkable. IMPRESSION: Stable large left lung opacity consistent with pneumonia. Electronically Signed   By: Marijo Conception M.D.   On: 01/29/2022 14:15    Assessment/Plan Active Problems:   Pneumonia of left lung due to Pseudomonas species (HCC)   ARDS (adult respiratory distress syndrome) (HCC)   Acute on chronic respiratory failure with hypoxia (HCC)   Tracheostomy status (HCC)   Chronic pain syndrome   Acute on chronic respiratory failure hypoxia-working towards 72-hour Goal. ARDS no change will continue to monitor she is clinically improved Pneumonia due to Pseudomonas supportive care Tracheostomy remains in place Chronic pain syndrome pain management   I have personally seen and evaluated the patient, evaluated laboratory and imaging results, formulated the assessment and plan and placed orders. The Patient requires high complexity decision making with multiple systems involvement.  Rounds were done with the Respiratory Therapy Director and Staff therapists and discussed with nursing staff also.  Allyne Gee, MD [facility] Pulmonary Critical Care Medicine Sleep Medicine

## 2022-01-30 NOTE — Progress Notes (Addendum)
Pulmonary [place]  PROGRESS NOTE     Janet Hensley  ZOX:[id number]  DOB: 07/18/1970   DOA: 01/08/2022  Referring Physician: Satira Sark, MD  HPI: Janet Hensley is a 52 y.o. female being followed for ventilator/airway/oxygen weaning Acute on Chronic Respiratory Failure.  Patient seen lying in bed, currently capped on room air and has now been for 72 hours.  She is scheduled for modified swallow study today.  Will consider decannulation after study.  Medications: Reviewed on Rounds  Physical Exam:  Vitals: Temp 97.4, pulse 125, respirations 18, BP 115/57, SpO2 95%  Ventilator Settings capped on room air  General: Comfortable at this time Neck: supple Cardiovascular: no malignant arrhythmias Respiratory: Bilaterally diminished Skin: no rash seen on limited exam Musculoskeletal: No gross abnormality Psychiatric:unable to assess Neurologic:no involuntary movements         Lab Data:   Basic Metabolic Panel: Recent Labs  Lab 01/24/22 0349 01/27/22 0127 01/29/22 0123  NA 138 134* 133*  K 4.1 4.0 4.2  CL 102 101 102  CO2 23 21* 23  GLUCOSE 137* 142* 142*  BUN 29* 25* 19  CREATININE 0.74 0.80 0.65  CALCIUM 10.0 10.9* 11.3*  MG 1.8 1.9  --     ABG: Recent Labs  Lab 01/24/22 1600  PHART 7.41  PCO2ART 39  PO2ART 82*  HCO3 24.7  O2SAT 96.6    Liver Function Tests: No results for input(s): "AST", "ALT", "ALKPHOS", "BILITOT", "PROT", "ALBUMIN" in the last 168 hours. No results for input(s): "LIPASE", "AMYLASE" in the last 168 hours. No results for input(s): "AMMONIA" in the last 168 hours.  CBC: Recent Labs  Lab 01/24/22 0349 01/27/22 0127 01/29/22 0122  WBC 13.8* 17.5* 20.0*  HGB 8.5* 8.2* 8.5*  HCT 28.0* 26.0* 28.4*  MCV 103.3* 101.2* 104.0*  PLT 491* 462* 551*    Cardiac Enzymes: No results for input(s): "CKTOTAL", "CKMB", "CKMBINDEX", "TROPONINI" in the last 168  hours.  BNP (last 3 results) Recent Labs    12/22/21 1836 12/27/21 0424  BNP 15.2 110.4*    ProBNP (last 3 results) No results for input(s): "PROBNP" in the last 8760 hours.  Radiological Exams: DG Chest Port 1 View  Result Date: 01/29/2022 CLINICAL DATA:  Pneumonia. EXAM: PORTABLE CHEST 1 VIEW COMPARISON:  January 26, 2022. FINDINGS: Tracheostomy is unchanged. Stable cardiomediastinal silhouette. Stable left upper and lower lobe airspace opacities are noted consistent with pneumonia. Bony thorax is unremarkable. IMPRESSION: Stable large left lung opacity consistent with pneumonia. Electronically Signed   By: Marijo Conception M.D.   On: 01/29/2022 14:15    Assessment/Plan Active Problems:   Pneumonia of left lung due to Pseudomonas species (HCC)   ARDS (adult respiratory distress syndrome) (HCC)   Acute on chronic respiratory failure with hypoxia (HCC)   Tracheostomy status (HCC)   Chronic pain syndrome   Acute on chronic respiratory failure hypoxia-working towards 72-hour Goal. ARDS no change will continue to monitor she is clinically improved Pneumonia due to Pseudomonas supportive care Tracheostomy remains in place Chronic pain syndrome pain management   I have personally seen and evaluated the patient, evaluated laboratory and imaging results, formulated the assessment and plan and placed orders. The Patient requires high complexity decision making with multiple systems involvement.  Rounds were done with the Respiratory Therapy Director and Staff therapists and discussed with nursing staff also.  Allyne Gee, MD [facility] Pulmonary Critical Care Medicine Sleep  Medicine

## 2022-01-30 NOTE — Progress Notes (Addendum)
Pulmonary [place]  PROGRESS NOTE     Janet Hensley  TKW:[id number]  DOB: Feb 18, 1970   DOA: 01/08/2022  Referring Physician: Satira Sark, MD  HPI: Janet Hensley is a 52 y.o. female being followed for ventilator/airway/oxygen weaning Acute on Chronic Respiratory Failure.  Patient seen lying in bed, family at bedside.  Patient remains capped on 2 L.  She did complete 24-hour capping goal.  She will be working towards 48-hour Goal.  She is slightly more tired than normal so we should consider ABG this afternoon.  Medications: Reviewed on Rounds  Physical Exam:  Vitals: Temp 98.2, pulse 109, respirations 21, BP 115/68, SpO2 95%  Ventilator Settings capped on 2 L nasal cannula  General: Comfortable at this time Neck: supple Cardiovascular: no malignant arrhythmias Respiratory: Bilaterally diminished Skin: no rash seen on limited exam Musculoskeletal: No gross abnormality Psychiatric:unable to assess Neurologic:no involuntary movements         Lab Data:   Basic Metabolic Panel: Recent Labs  Lab 01/24/22 0349 01/27/22 0127 01/29/22 0123  NA 138 134* 133*  K 4.1 4.0 4.2  CL 102 101 102  CO2 23 21* 23  GLUCOSE 137* 142* 142*  BUN 29* 25* 19  CREATININE 0.74 0.80 0.65  CALCIUM 10.0 10.9* 11.3*  MG 1.8 1.9  --     ABG: Recent Labs  Lab 01/24/22 1600  PHART 7.41  PCO2ART 39  PO2ART 82*  HCO3 24.7  O2SAT 96.6    Liver Function Tests: No results for input(s): "AST", "ALT", "ALKPHOS", "BILITOT", "PROT", "ALBUMIN" in the last 168 hours. No results for input(s): "LIPASE", "AMYLASE" in the last 168 hours. No results for input(s): "AMMONIA" in the last 168 hours.  CBC: Recent Labs  Lab 01/24/22 0349 01/27/22 0127 01/29/22 0122  WBC 13.8* 17.5* 20.0*  HGB 8.5* 8.2* 8.5*  HCT 28.0* 26.0* 28.4*  MCV 103.3* 101.2* 104.0*  PLT 491* 462* 551*    Cardiac Enzymes: No results  for input(s): "CKTOTAL", "CKMB", "CKMBINDEX", "TROPONINI" in the last 168 hours.  BNP (last 3 results) Recent Labs    12/22/21 1836 12/27/21 0424  BNP 15.2 110.4*    ProBNP (last 3 results) No results for input(s): "PROBNP" in the last 8760 hours.  Radiological Exams: DG Chest Port 1 View  Result Date: 01/29/2022 CLINICAL DATA:  Pneumonia. EXAM: PORTABLE CHEST 1 VIEW COMPARISON:  January 26, 2022. FINDINGS: Tracheostomy is unchanged. Stable cardiomediastinal silhouette. Stable left upper and lower lobe airspace opacities are noted consistent with pneumonia. Bony thorax is unremarkable. IMPRESSION: Stable large left lung opacity consistent with pneumonia. Electronically Signed   By: Marijo Conception M.D.   On: 01/29/2022 14:15    Assessment/Plan Active Problems:   Pneumonia of left lung due to Pseudomonas species (HCC)   ARDS (adult respiratory distress syndrome) (HCC)   Acute on chronic respiratory failure with hypoxia (HCC)   Tracheostomy status (HCC)   Chronic pain syndrome   Acute on chronic respiratory failure hypoxia-working towards 48-hour Goal. ARDS no change will continue to monitor she is clinically improved Pneumonia due to Pseudomonas supportive care Tracheostomy remains in place Chronic pain syndrome pain management   I have personally seen and evaluated the patient, evaluated laboratory and imaging results, formulated the assessment and plan and placed orders. The Patient requires high complexity decision making with multiple systems involvement.  Rounds were done with the Respiratory Therapy Director and Staff therapists and discussed  with nursing staff also.  Allyne Gee, MD [facility] Pulmonary Critical Care Medicine Sleep Medicine

## 2022-01-31 DIAGNOSIS — G894 Chronic pain syndrome: Secondary | ICD-10-CM | POA: Diagnosis not present

## 2022-01-31 DIAGNOSIS — J9600 Acute respiratory failure, unspecified whether with hypoxia or hypercapnia: Secondary | ICD-10-CM | POA: Diagnosis not present

## 2022-01-31 DIAGNOSIS — J80 Acute respiratory distress syndrome: Secondary | ICD-10-CM | POA: Diagnosis not present

## 2022-01-31 DIAGNOSIS — J151 Pneumonia due to Pseudomonas: Secondary | ICD-10-CM | POA: Diagnosis not present

## 2022-01-31 DIAGNOSIS — D649 Anemia, unspecified: Secondary | ICD-10-CM | POA: Diagnosis not present

## 2022-01-31 DIAGNOSIS — R1310 Dysphagia, unspecified: Secondary | ICD-10-CM | POA: Diagnosis not present

## 2022-01-31 DIAGNOSIS — J9621 Acute and chronic respiratory failure with hypoxia: Secondary | ICD-10-CM | POA: Diagnosis not present

## 2022-01-31 DIAGNOSIS — E46 Unspecified protein-calorie malnutrition: Secondary | ICD-10-CM | POA: Diagnosis not present

## 2022-01-31 DIAGNOSIS — J449 Chronic obstructive pulmonary disease, unspecified: Secondary | ICD-10-CM | POA: Diagnosis not present

## 2022-01-31 DIAGNOSIS — D72829 Elevated white blood cell count, unspecified: Secondary | ICD-10-CM | POA: Diagnosis not present

## 2022-01-31 DIAGNOSIS — J189 Pneumonia, unspecified organism: Secondary | ICD-10-CM | POA: Diagnosis not present

## 2022-01-31 DIAGNOSIS — Z930 Tracheostomy status: Secondary | ICD-10-CM | POA: Diagnosis not present

## 2022-01-31 LAB — BASIC METABOLIC PANEL
Anion gap: 10 (ref 5–15)
BUN: 14 mg/dL (ref 6–20)
CO2: 20 mmol/L — ABNORMAL LOW (ref 22–32)
Calcium: 11 mg/dL — ABNORMAL HIGH (ref 8.9–10.3)
Chloride: 105 mmol/L (ref 98–111)
Creatinine, Ser: 0.71 mg/dL (ref 0.44–1.00)
GFR, Estimated: >60 mL/min (ref >60)
Glucose, Bld: 116 mg/dL — ABNORMAL HIGH (ref 70–99)
Potassium: 3.5 mmol/L (ref 3.5–5.1)
Sodium: 135 mmol/L (ref 135–145)

## 2022-01-31 LAB — CBC
HCT: 27 % — ABNORMAL LOW (ref 36.0–46.0)
Hemoglobin: 8.5 g/dL — ABNORMAL LOW (ref 12.0–15.0)
MCH: 31.8 pg (ref 26.0–34.0)
MCHC: 31.5 g/dL (ref 30.0–36.0)
MCV: 101.1 fL — ABNORMAL HIGH (ref 80.0–100.0)
Platelets: 541 10*3/uL — ABNORMAL HIGH (ref 150–400)
RBC: 2.67 MIL/uL — ABNORMAL LOW (ref 3.87–5.11)
RDW: 15.9 % — ABNORMAL HIGH (ref 11.5–15.5)
WBC: 20.4 10*3/uL — ABNORMAL HIGH (ref 4.0–10.5)
nRBC: 0 % (ref 0.0–0.2)

## 2022-01-31 NOTE — Progress Notes (Signed)
Pulmonary [place]  PROGRESS NOTE     Janet Hensley  IEP:[id number]  DOB: Dec 01, 1970   DOA: 01/08/2022  Referring Physician: Satira Sark, MD  HPI: Janet Hensley is a 52 y.o. female being followed for ventilator/airway/oxygen weaning Acute on Chronic Respiratory Failure.  Patient is capping doing very well good cough she is ready for decannulation  Medications: Reviewed on Rounds  Physical Exam:  Vitals: Temperature is 96.8 pulse 101 respiratory 22 blood pressure 115/62 saturation 96%  Ventilator Settings capping off the ventilator  General: Comfortable at this time Neck: supple Cardiovascular: no malignant arrhythmias Respiratory: Scattered rhonchi expansion is equal Skin: no rash seen on limited exam Musculoskeletal: No gross abnormality Psychiatric:unable to assess Neurologic:no involuntary movements         Lab Data:   Basic Metabolic Panel: Recent Labs  Lab 01/27/22 0127 01/29/22 0123 01/31/22 0233  NA 134* 133* 135  K 4.0 4.2 3.5  CL 101 102 105  CO2 21* 23 20*  GLUCOSE 142* 142* 116*  BUN 25* 19 14  CREATININE 0.80 0.65 0.71  CALCIUM 10.9* 11.3* 11.0*  MG 1.9  --   --     ABG: Recent Labs  Lab 01/24/22 1600  PHART 7.41  PCO2ART 39  PO2ART 82*  HCO3 24.7  O2SAT 96.6    Liver Function Tests: No results for input(s): "AST", "ALT", "ALKPHOS", "BILITOT", "PROT", "ALBUMIN" in the last 168 hours. No results for input(s): "LIPASE", "AMYLASE" in the last 168 hours. No results for input(s): "AMMONIA" in the last 168 hours.  CBC: Recent Labs  Lab 01/27/22 0127 01/29/22 0122 01/31/22 0233  WBC 17.5* 20.0* 20.4*  HGB 8.2* 8.5* 8.5*  HCT 26.0* 28.4* 27.0*  MCV 101.2* 104.0* 101.1*  PLT 462* 551* 541*    Cardiac Enzymes: No results for input(s): "CKTOTAL", "CKMB", "CKMBINDEX", "TROPONINI" in the last 168 hours.  BNP (last 3 results) Recent Labs     12/22/21 1836 12/27/21 0424  BNP 15.2 110.4*    ProBNP (last 3 results) No results for input(s): "PROBNP" in the last 8760 hours.  Radiological Exams: DG Chest Port 1 View  Result Date: 01/29/2022 CLINICAL DATA:  Pneumonia. EXAM: PORTABLE CHEST 1 VIEW COMPARISON:  January 26, 2022. FINDINGS: Tracheostomy is unchanged. Stable cardiomediastinal silhouette. Stable left upper and lower lobe airspace opacities are noted consistent with pneumonia. Bony thorax is unremarkable. IMPRESSION: Stable large left lung opacity consistent with pneumonia. Electronically Signed   By: Marijo Conception M.D.   On: 01/29/2022 14:15    Assessment/Plan Active Problems:   Pneumonia of left lung due to Pseudomonas species (HCC)   ARDS (adult respiratory distress syndrome) (HCC)   Acute on chronic respiratory failure with hypoxia (HCC)   Tracheostomy status (HCC)   Chronic pain syndrome   Acute on chronic respiratory failure hypoxia plan is to proceed to decannulation Tracheostomy will be removed Chronic pain syndrome pain management ARDS supportive care will continue to monitor Pseudomonas pneumonia has been treated   I have personally seen and evaluated the patient, evaluated laboratory and imaging results, formulated the assessment and plan and placed orders. The Patient requires high complexity decision making with multiple systems involvement.  Rounds were done with the Respiratory Therapy Director and Staff therapists and discussed with nursing staff also.  Allyne Gee, MD [facility] Pulmonary Critical Care Medicine Sleep Medicine

## 2022-02-01 DIAGNOSIS — J9600 Acute respiratory failure, unspecified whether with hypoxia or hypercapnia: Secondary | ICD-10-CM | POA: Diagnosis not present

## 2022-02-01 DIAGNOSIS — E46 Unspecified protein-calorie malnutrition: Secondary | ICD-10-CM | POA: Diagnosis not present

## 2022-02-01 DIAGNOSIS — J189 Pneumonia, unspecified organism: Secondary | ICD-10-CM | POA: Diagnosis not present

## 2022-02-01 DIAGNOSIS — R1310 Dysphagia, unspecified: Secondary | ICD-10-CM | POA: Diagnosis not present

## 2022-02-01 DIAGNOSIS — D649 Anemia, unspecified: Secondary | ICD-10-CM | POA: Diagnosis not present

## 2022-02-01 DIAGNOSIS — J449 Chronic obstructive pulmonary disease, unspecified: Secondary | ICD-10-CM | POA: Diagnosis not present

## 2022-02-01 DIAGNOSIS — D72829 Elevated white blood cell count, unspecified: Secondary | ICD-10-CM | POA: Diagnosis not present

## 2022-02-02 DIAGNOSIS — J9600 Acute respiratory failure, unspecified whether with hypoxia or hypercapnia: Secondary | ICD-10-CM | POA: Diagnosis not present

## 2022-02-02 DIAGNOSIS — R1310 Dysphagia, unspecified: Secondary | ICD-10-CM | POA: Diagnosis not present

## 2022-02-02 DIAGNOSIS — D649 Anemia, unspecified: Secondary | ICD-10-CM | POA: Diagnosis not present

## 2022-02-02 DIAGNOSIS — J449 Chronic obstructive pulmonary disease, unspecified: Secondary | ICD-10-CM | POA: Diagnosis not present

## 2022-02-02 DIAGNOSIS — D72829 Elevated white blood cell count, unspecified: Secondary | ICD-10-CM | POA: Diagnosis not present

## 2022-02-02 DIAGNOSIS — E46 Unspecified protein-calorie malnutrition: Secondary | ICD-10-CM | POA: Diagnosis not present

## 2022-02-02 DIAGNOSIS — J189 Pneumonia, unspecified organism: Secondary | ICD-10-CM | POA: Diagnosis not present

## 2022-02-02 LAB — URINALYSIS, ROUTINE W REFLEX MICROSCOPIC
Bilirubin Urine: NEGATIVE
Glucose, UA: 50 mg/dL — AB
Hgb urine dipstick: NEGATIVE
Ketones, ur: NEGATIVE mg/dL
Nitrite: NEGATIVE
Protein, ur: 100 mg/dL — AB
Specific Gravity, Urine: 1.014 (ref 1.005–1.030)
pH: 6 (ref 5.0–8.0)

## 2022-02-03 DIAGNOSIS — D72829 Elevated white blood cell count, unspecified: Secondary | ICD-10-CM | POA: Diagnosis not present

## 2022-02-03 DIAGNOSIS — D649 Anemia, unspecified: Secondary | ICD-10-CM | POA: Diagnosis not present

## 2022-02-03 DIAGNOSIS — J9600 Acute respiratory failure, unspecified whether with hypoxia or hypercapnia: Secondary | ICD-10-CM | POA: Diagnosis not present

## 2022-02-03 DIAGNOSIS — J189 Pneumonia, unspecified organism: Secondary | ICD-10-CM | POA: Diagnosis not present

## 2022-02-03 DIAGNOSIS — E46 Unspecified protein-calorie malnutrition: Secondary | ICD-10-CM | POA: Diagnosis not present

## 2022-02-03 DIAGNOSIS — R1310 Dysphagia, unspecified: Secondary | ICD-10-CM | POA: Diagnosis not present

## 2022-02-03 DIAGNOSIS — J449 Chronic obstructive pulmonary disease, unspecified: Secondary | ICD-10-CM | POA: Diagnosis not present

## 2022-02-03 LAB — CBC
HCT: 28.1 % — ABNORMAL LOW (ref 36.0–46.0)
Hemoglobin: 8.8 g/dL — ABNORMAL LOW (ref 12.0–15.0)
MCH: 31.9 pg (ref 26.0–34.0)
MCHC: 31.3 g/dL (ref 30.0–36.0)
MCV: 101.8 fL — ABNORMAL HIGH (ref 80.0–100.0)
Platelets: 510 10*3/uL — ABNORMAL HIGH (ref 150–400)
RBC: 2.76 MIL/uL — ABNORMAL LOW (ref 3.87–5.11)
RDW: 15.6 % — ABNORMAL HIGH (ref 11.5–15.5)
WBC: 14.3 10*3/uL — ABNORMAL HIGH (ref 4.0–10.5)
nRBC: 0 % (ref 0.0–0.2)

## 2022-02-03 LAB — POTASSIUM
Potassium: 4.6 mmol/L (ref 3.5–5.1)
Potassium: 3.5 mmol/L (ref 3.5–5.1)

## 2022-02-03 LAB — BASIC METABOLIC PANEL
Anion gap: 8 (ref 5–15)
BUN: 12 mg/dL (ref 6–20)
CO2: 20 mmol/L — ABNORMAL LOW (ref 22–32)
Calcium: 10.5 mg/dL — ABNORMAL HIGH (ref 8.9–10.3)
Chloride: 106 mmol/L (ref 98–111)
Creatinine, Ser: 0.57 mg/dL (ref 0.44–1.00)
GFR, Estimated: >60 mL/min (ref >60)
Glucose, Bld: 98 mg/dL (ref 70–99)
Potassium: 2.9 mmol/L — ABNORMAL LOW (ref 3.5–5.1)
Sodium: 134 mmol/L — ABNORMAL LOW (ref 135–145)

## 2022-02-03 LAB — URINE CULTURE
Culture: NO GROWTH
Special Requests: NORMAL

## 2022-02-03 LAB — MAGNESIUM: Magnesium: 1.8 mg/dL (ref 1.7–2.4)

## 2022-02-04 DIAGNOSIS — E46 Unspecified protein-calorie malnutrition: Secondary | ICD-10-CM | POA: Diagnosis not present

## 2022-02-04 DIAGNOSIS — D649 Anemia, unspecified: Secondary | ICD-10-CM | POA: Diagnosis not present

## 2022-02-04 DIAGNOSIS — R1310 Dysphagia, unspecified: Secondary | ICD-10-CM | POA: Diagnosis not present

## 2022-02-04 DIAGNOSIS — J189 Pneumonia, unspecified organism: Secondary | ICD-10-CM | POA: Diagnosis not present

## 2022-02-04 DIAGNOSIS — J9600 Acute respiratory failure, unspecified whether with hypoxia or hypercapnia: Secondary | ICD-10-CM | POA: Diagnosis not present

## 2022-02-04 DIAGNOSIS — J449 Chronic obstructive pulmonary disease, unspecified: Secondary | ICD-10-CM | POA: Diagnosis not present

## 2022-02-04 DIAGNOSIS — D72829 Elevated white blood cell count, unspecified: Secondary | ICD-10-CM | POA: Diagnosis not present

## 2022-02-04 LAB — BASIC METABOLIC PANEL
Anion gap: 8 (ref 5–15)
BUN: 12 mg/dL (ref 6–20)
CO2: 21 mmol/L — ABNORMAL LOW (ref 22–32)
Calcium: 10.6 mg/dL — ABNORMAL HIGH (ref 8.9–10.3)
Chloride: 107 mmol/L (ref 98–111)
Creatinine, Ser: 0.42 mg/dL — ABNORMAL LOW (ref 0.44–1.00)
GFR, Estimated: >60 mL/min (ref >60)
Glucose, Bld: 103 mg/dL — ABNORMAL HIGH (ref 70–99)
Potassium: 3.5 mmol/L (ref 3.5–5.1)
Sodium: 136 mmol/L (ref 135–145)

## 2022-02-05 DIAGNOSIS — J9600 Acute respiratory failure, unspecified whether with hypoxia or hypercapnia: Secondary | ICD-10-CM | POA: Diagnosis not present

## 2022-02-05 DIAGNOSIS — E46 Unspecified protein-calorie malnutrition: Secondary | ICD-10-CM | POA: Diagnosis not present

## 2022-02-05 DIAGNOSIS — R1310 Dysphagia, unspecified: Secondary | ICD-10-CM | POA: Diagnosis not present

## 2022-02-05 DIAGNOSIS — J449 Chronic obstructive pulmonary disease, unspecified: Secondary | ICD-10-CM | POA: Diagnosis not present

## 2022-02-05 DIAGNOSIS — J189 Pneumonia, unspecified organism: Secondary | ICD-10-CM | POA: Diagnosis not present

## 2022-02-05 DIAGNOSIS — D72829 Elevated white blood cell count, unspecified: Secondary | ICD-10-CM | POA: Diagnosis not present

## 2022-02-05 DIAGNOSIS — D649 Anemia, unspecified: Secondary | ICD-10-CM | POA: Diagnosis not present

## 2022-02-05 NOTE — PMR Pre-admission (Shared)
PMR Admission Coordinator Pre-Admission Assessment  Patient: Janet Hensley is an 52 y.o., female MRN: [id number] DOB: 27-May-1970 Height:   Weight:    Insurance Information HMO:  yes   PPO:      PCP:      IPA:      80/20:      OTHER:  PRIMARY: Comptroller       Policy#: [id number]      Subscriber: Pt CM Name: ***      Phone#: [phone]     Fax#: [phone] Pre-Cert#: [id number]      Employer:  Benefits:  Phone #: [phone]     Name:  Irene Shipper Date: 01/27/2022 - still active Deductible: $7,500 ($0 met) OOP Max: $9,400 ($0 met) CIR: 50% coverage; 50% co-insurance SNF: 50% coverage; 50% co-insurance; limited to 90 days/cal yr (90 remaining) Outpatient:  $50 copay/visit Home Health:  $50 copay/visit DME: 50% coverage; 50% co-insurance Providers: in network  SECONDARY: Medicaid [place]       Policy#: [id number]      Phone#:     Financial Counselor:       Phone#:   The "Data Collection Information Summary" for patients in Inpatient Rehabilitation Facilities with attached "Privacy Act [place] Records" was provided and verbally reviewed with: Patient  Emergency Contact Information Contact Information     Name Relation Home Work Mobile   Ciancio,Chris Spouse   [phone]       Current Medical History  Patient Admitting Diagnosis: Respiratory Failure, Debility, Bactremia  History of Present Illness: Pt. Is a 52 year old smoker presented [facility] 12/22[facility]21 with cough, shortness of breath nausea and vomiting, hypoxic to 89%, requiring.  intubation for mechanical ventilation. Chest x-ray at the time showed complete whiteout of left lung.  Bronchoscopy showed purulent secretions on the left which were suctioned out. Blood cultures showed Rothia Mucilaginosa, urine tested positive for Legionella, bronchoscopy was positive for Pseudomonas and stenotrophomonas . ID consult was obtained she was treated with cefepime and levofloxacin and nebulized tobramycin . Course was  complicated by AKI and bicarbonate drip was started. She was transferred to [facility] on 12/2 for ARDS management and possible need for ECMO. Patient did receive ECMO. Unable to wean of vent and tracheostomy placed 12/8. Occasionally had soft BP and required low dose levo. Was started on midodrine for soft BP and off levo. Transferring to [facility] 12/13. Pt. Completed Levaquin 01/09/22. Pt. Also with AKI due to septic acute tubular necrosis, but that resolved. She was was weaned from the vent and ultimately decannulated 01/31/21. IV merepenem to end 02/04/22. Pt with some dysphagia  and malnutrition and had coretrak placed during acute admission. This was discontinued while at [facility]. She is currently on a dysphagia 2 diet with thin liquids, per SLP recommendations. PT/OT/SLP worked with during stay at [facility] and they recommend CIR to assist return to PLOF. -    Patient's medical record from [facility]  has been reviewed by the rehabilitation admission coordinator and physician.  Past Medical History  Past Medical History:  Diagnosis Date   Asthma 01/15/2012    Has the patient had major surgery during 100 days prior to admission? Yes  Family History   family history is not on file.  Current Medications No current facility-administered medications for this encounter.  Patients Current Diet: Diet Dysphagia 2, Thin   Precautions / Restrictions     Has the patient had 2 or more falls or a fall with injury in  the past year? No  Prior Activity Level Community (5-7x/wk): Pt. acitve in the community PTA   Prior Functional Level Self Care: Did the patient need help bathing, dressing, using the toilet or eating? Independent  Indoor Mobility: Did the patient need assistance with walking from room to room (with or without device)? Independent  Stairs: Did the patient need assistance with internal or external stairs (with or without device)? Independent  Functional  Cognition: Did the patient need help planning regular tasks such as shopping or remembering to take medications? Independent  Patient Information Are you of Hispanic, Latino/a,or Spanish origin?: A. No, not of Hispanic, Latino/a, or Spanish origin What is your race?: A. White Do you need or want an interpreter to communicate with a doctor or health care staff?: 0. No  Patient's Response To:  Health Literacy and Transportation Is the patient able to respond to health literacy and transportation needs?: Yes Health Literacy - How often do you need to have someone help you when you read instructions, pamphlets, or other written material from your doctor or pharmacy?: Sometimes In the past 12 months, has lack of transportation kept you from medical appointments or from getting medications?: No In the past 12 months, has lack of transportation kept you from meetings, work, or from getting things needed for daily living?: No  Home Assistive Devices / Equipment    Prior Device Use: Indicate devices/aids used by the patient prior to current illness, exacerbation or injury? None of the above   Prior Functional Level Current Functional Level  Bed Mobility  Independent Mod assist   Transfers  Independent  Max assist   Mobility - Walk/Wheelchair  Independent  Total assist   Upper Body Dressing  Independent  Other   Lower Body Dressing  Independent  Max assist   Grooming  Independent  Mod assist   Eating/Drinking  Independent  Min assist   Toilet Transfer  Independent  Mod Independent   Bladder Continence   Continent  continent   Bowel Management  Continent  continent   Stair Climbing  Independent  Independent   Communication  Independent  min A   Memory  Independent min A     Special Needs/ Care Considerations Skin stoma from trach, removed  Previous Home Environment (from acute therapy documentation)  Lives With: Spouse; Other (Comment) Type of Home:  Mobile home Home Layout: One level Home Access: Stairs to enter Entrance Stairs-Rails: Left Entrance Stairs-Number of Steps: 4 Bathroom Shower/Tub: Armed forces operational officer Accessibility: Yes How Accessible: Accessible via wheelchair; Accessible via walker   Discharge Living Setting Plans for Discharge Living Setting: Patient's home Type of Home at Discharge: House Discharge Home Layout: One level Discharge Home Access: Stairs to enter Entrance Stairs-Rails: Left Entrance Stairs-Number of Steps: 4 Discharge Bathroom Shower/Tub: Tub/shower unit Discharge Bathroom Toilet: Standard Discharge Bathroom Accessibility: Yes How Accessible: Accessible via walker; Accessible via wheelchair   Social/Family/Support Systems Patient Roles: Other (Comment) Contact Information: [phone] Anticipated Caregiver: Brother John Anticipated Caregiver's Contact Information: Husband works, brother in the home 24/7 Ability/Limitations of Caregiver: none Caregiver Availability: 24/7 Discharge Plan Discussed with Primary Caregiver: Yes Is Caregiver In Agreement with Plan?: Yes Does Caregiver/Family have Issues with Lodging/Transportation while Pt is in Rehab?: No   Goals Patient/Family Goal for Rehab: PT/OT Min A, SLP Mod I Expected length of stay: 18-21 days Pt/Family Agrees to Admission and willing to participate: Yes Program Orientation Provided & Reviewed with Pt/Caregiver Including Roles  & Responsibilities: Yes  Decrease burden of Care through IP rehab admission: None   Possible need for SNF placement upon discharge: not anticipated  Patient Condition: I have reviewed medical records from [facility] , spoken with CM, and patient and family member. I met with patient at the bedside for inpatient rehabilitation assessment.  Patient will benefit from ongoing PT, OT, and SLP, can actively participate in 3 hours of therapy a day 5 days of the week,  and can make measurable gains during the admission.  Patient will also benefit from the coordinated team approach during an Inpatient Acute Rehabilitation admission.  The patient will receive intensive therapy as well as Rehabilitation physician, nursing, social worker, and care management interventions.  Due to safety, skin/wound care, disease management, medication administration, pain management, and patient education the patient requires 24 hour a day rehabilitation nursing.  The patient is currently Max A with mobility and basic ADLs.  Discharge setting and therapy post discharge at home with home health is anticipated.  Patient has agreed to participate in the Acute Inpatient Rehabilitation Program and will admit {Time; today/tomorrow:10263}.  Preadmission Screen Completed By:  Genella Mech, 02/05/2022 8:20 AM ______________________________________________________________________   Discussed status with Dr. Marland Kitchen on *** at *** and received approval for admission today.  Admission Coordinator:  Genella Mech, CCC-SLP, time Marland KitchenSudie Grumbling ***   Assessment/Plan: Diagnosis: Does the need for close, 24 hr/day Medical supervision in concert with the patient's rehab needs make it unreasonable for this patient to be served in a less intensive setting? {yes_no_potentially:3041433} Co-Morbidities requiring supervision/potential complications: *** Due to {due JS:9702637}, does the patient require 24 hr/day rehab nursing? {yes_no_potentially:3041433} Does the patient require coordinated care of a physician, rehab nurse, PT, OT, and SLP to address physical and functional deficits in the context of the above medical diagnosis(es)? {yes_no_potentially:3041433} Addressing deficits in the following areas: {deficits:3041436} Can the patient actively participate in an intensive therapy program of at least 3 hrs of therapy 5 days a week? {yes_no_potentially:3041433} The potential for patient to make measurable gains  while on inpatient rehab is {potential:3041437} Anticipated functional outcomes upon discharge from inpatient rehab: {functional outcomes:304600100} PT, {functional outcomes:304600100} OT, {functional outcomes:304600100} SLP Estimated rehab length of stay to reach the above functional goals is: *** Anticipated discharge destination: {anticipated dc setting:21604} 10. Overall Rehab/Functional Prognosis: {potential:3041437}  MD Signature ***

## 2022-02-06 ENCOUNTER — Other Ambulatory Visit: Payer: Self-pay

## 2022-02-06 ENCOUNTER — Encounter (HOSPITAL_COMMUNITY): Payer: Self-pay | Admitting: Physical Medicine and Rehabilitation

## 2022-02-06 ENCOUNTER — Inpatient Hospital Stay (HOSPITAL_COMMUNITY): Payer: 59

## 2022-02-06 ENCOUNTER — Inpatient Hospital Stay (HOSPITAL_COMMUNITY)
Admission: RE | Admit: 2022-02-06 | Discharge: 2022-02-28 | DRG: 945 | Disposition: A | Discharge status: Home / Self Care | Payer: 59 | Source: Other Acute Inpatient Hospital | Attending: Physical Medicine and Rehabilitation | Admitting: Physical Medicine and Rehabilitation

## 2022-02-06 DIAGNOSIS — R1310 Dysphagia, unspecified: Secondary | ICD-10-CM | POA: Diagnosis present

## 2022-02-06 DIAGNOSIS — Z930 Tracheostomy status: Secondary | ICD-10-CM | POA: Diagnosis not present

## 2022-02-06 DIAGNOSIS — G7281 Critical illness myopathy: Secondary | ICD-10-CM | POA: Diagnosis not present

## 2022-02-06 DIAGNOSIS — M542 Cervicalgia: Secondary | ICD-10-CM | POA: Diagnosis present

## 2022-02-06 DIAGNOSIS — K5903 Drug induced constipation: Secondary | ICD-10-CM | POA: Diagnosis present

## 2022-02-06 DIAGNOSIS — Z794 Long term (current) use of insulin: Secondary | ICD-10-CM

## 2022-02-06 DIAGNOSIS — K633 Ulcer of intestine: Secondary | ICD-10-CM | POA: Diagnosis present

## 2022-02-06 DIAGNOSIS — N390 Urinary tract infection, site not specified: Secondary | ICD-10-CM | POA: Diagnosis not present

## 2022-02-06 DIAGNOSIS — R42 Dizziness and giddiness: Secondary | ICD-10-CM | POA: Diagnosis present

## 2022-02-06 DIAGNOSIS — J90 Pleural effusion, not elsewhere classified: Secondary | ICD-10-CM | POA: Diagnosis present

## 2022-02-06 DIAGNOSIS — F4322 Adjustment disorder with anxiety: Secondary | ICD-10-CM | POA: Diagnosis not present

## 2022-02-06 DIAGNOSIS — R0602 Shortness of breath: Secondary | ICD-10-CM | POA: Diagnosis not present

## 2022-02-06 DIAGNOSIS — J151 Pneumonia due to Pseudomonas: Secondary | ICD-10-CM | POA: Diagnosis not present

## 2022-02-06 DIAGNOSIS — Z8619 Personal history of other infectious and parasitic diseases: Secondary | ICD-10-CM | POA: Diagnosis not present

## 2022-02-06 DIAGNOSIS — K529 Noninfective gastroenteritis and colitis, unspecified: Secondary | ICD-10-CM | POA: Diagnosis not present

## 2022-02-06 DIAGNOSIS — G479 Sleep disorder, unspecified: Secondary | ICD-10-CM | POA: Diagnosis present

## 2022-02-06 DIAGNOSIS — Z79899 Other long term (current) drug therapy: Secondary | ICD-10-CM

## 2022-02-06 DIAGNOSIS — G894 Chronic pain syndrome: Secondary | ICD-10-CM | POA: Diagnosis present

## 2022-02-06 DIAGNOSIS — R1030 Lower abdominal pain, unspecified: Secondary | ICD-10-CM

## 2022-02-06 DIAGNOSIS — K648 Other hemorrhoids: Secondary | ICD-10-CM | POA: Diagnosis not present

## 2022-02-06 DIAGNOSIS — R7303 Prediabetes: Secondary | ICD-10-CM | POA: Diagnosis present

## 2022-02-06 DIAGNOSIS — I213 ST elevation (STEMI) myocardial infarction of unspecified site: Secondary | ICD-10-CM | POA: Diagnosis not present

## 2022-02-06 DIAGNOSIS — Z79891 Long term (current) use of opiate analgesic: Secondary | ICD-10-CM | POA: Diagnosis not present

## 2022-02-06 DIAGNOSIS — E876 Hypokalemia: Secondary | ICD-10-CM | POA: Diagnosis not present

## 2022-02-06 DIAGNOSIS — K5900 Constipation, unspecified: Secondary | ICD-10-CM | POA: Diagnosis not present

## 2022-02-06 DIAGNOSIS — J9602 Acute respiratory failure with hypercapnia: Secondary | ICD-10-CM | POA: Diagnosis not present

## 2022-02-06 DIAGNOSIS — Z8701 Personal history of pneumonia (recurrent): Secondary | ICD-10-CM

## 2022-02-06 DIAGNOSIS — F17210 Nicotine dependence, cigarettes, uncomplicated: Secondary | ICD-10-CM | POA: Diagnosis not present

## 2022-02-06 DIAGNOSIS — R0902 Hypoxemia: Secondary | ICD-10-CM | POA: Diagnosis present

## 2022-02-06 DIAGNOSIS — R5381 Other malaise: Secondary | ICD-10-CM | POA: Diagnosis not present

## 2022-02-06 DIAGNOSIS — G629 Polyneuropathy, unspecified: Secondary | ICD-10-CM | POA: Diagnosis present

## 2022-02-06 DIAGNOSIS — I252 Old myocardial infarction: Secondary | ICD-10-CM

## 2022-02-06 DIAGNOSIS — K3189 Other diseases of stomach and duodenum: Secondary | ICD-10-CM | POA: Diagnosis present

## 2022-02-06 DIAGNOSIS — J4489 Other specified chronic obstructive pulmonary disease: Secondary | ICD-10-CM | POA: Diagnosis present

## 2022-02-06 DIAGNOSIS — E46 Unspecified protein-calorie malnutrition: Secondary | ICD-10-CM | POA: Diagnosis not present

## 2022-02-06 DIAGNOSIS — R531 Weakness: Secondary | ICD-10-CM | POA: Diagnosis present

## 2022-02-06 DIAGNOSIS — D539 Nutritional anemia, unspecified: Secondary | ICD-10-CM | POA: Diagnosis present

## 2022-02-06 DIAGNOSIS — Z635 Disruption of family by separation and divorce: Secondary | ICD-10-CM

## 2022-02-06 DIAGNOSIS — R933 Abnormal findings on diagnostic imaging of other parts of digestive tract: Secondary | ICD-10-CM | POA: Diagnosis not present

## 2022-02-06 DIAGNOSIS — J9600 Acute respiratory failure, unspecified whether with hypoxia or hypercapnia: Secondary | ICD-10-CM | POA: Diagnosis not present

## 2022-02-06 DIAGNOSIS — R112 Nausea with vomiting, unspecified: Secondary | ICD-10-CM | POA: Diagnosis not present

## 2022-02-06 DIAGNOSIS — D649 Anemia, unspecified: Secondary | ICD-10-CM

## 2022-02-06 DIAGNOSIS — I2510 Atherosclerotic heart disease of native coronary artery without angina pectoris: Secondary | ICD-10-CM | POA: Diagnosis present

## 2022-02-06 DIAGNOSIS — N179 Acute kidney failure, unspecified: Secondary | ICD-10-CM | POA: Diagnosis not present

## 2022-02-06 DIAGNOSIS — E871 Hypo-osmolality and hyponatremia: Secondary | ICD-10-CM | POA: Diagnosis present

## 2022-02-06 DIAGNOSIS — R948 Abnormal results of function studies of other organs and systems: Secondary | ICD-10-CM | POA: Diagnosis present

## 2022-02-06 DIAGNOSIS — Z87442 Personal history of urinary calculi: Secondary | ICD-10-CM

## 2022-02-06 DIAGNOSIS — R109 Unspecified abdominal pain: Secondary | ICD-10-CM | POA: Diagnosis not present

## 2022-02-06 DIAGNOSIS — D72829 Elevated white blood cell count, unspecified: Secondary | ICD-10-CM | POA: Diagnosis not present

## 2022-02-06 DIAGNOSIS — L89322 Pressure ulcer of left buttock, stage 2: Secondary | ICD-10-CM | POA: Diagnosis present

## 2022-02-06 DIAGNOSIS — K625 Hemorrhage of anus and rectum: Secondary | ICD-10-CM

## 2022-02-06 DIAGNOSIS — T402X5D Adverse effect of other opioids, subsequent encounter: Secondary | ICD-10-CM

## 2022-02-06 DIAGNOSIS — J449 Chronic obstructive pulmonary disease, unspecified: Secondary | ICD-10-CM | POA: Diagnosis not present

## 2022-02-06 DIAGNOSIS — R32 Unspecified urinary incontinence: Secondary | ICD-10-CM | POA: Diagnosis present

## 2022-02-06 DIAGNOSIS — J9601 Acute respiratory failure with hypoxia: Secondary | ICD-10-CM | POA: Diagnosis not present

## 2022-02-06 DIAGNOSIS — R7989 Other specified abnormal findings of blood chemistry: Secondary | ICD-10-CM | POA: Diagnosis present

## 2022-02-06 DIAGNOSIS — J189 Pneumonia, unspecified organism: Secondary | ICD-10-CM | POA: Diagnosis not present

## 2022-02-06 DIAGNOSIS — R69 Illness, unspecified: Secondary | ICD-10-CM | POA: Diagnosis not present

## 2022-02-06 DIAGNOSIS — B9620 Unspecified Escherichia coli [E. coli] as the cause of diseases classified elsewhere: Secondary | ICD-10-CM | POA: Diagnosis present

## 2022-02-06 LAB — CBC
HCT: 32.1 % — ABNORMAL LOW (ref 36.0–46.0)
Hemoglobin: 9.7 g/dL — ABNORMAL LOW (ref 12.0–15.0)
MCH: 30.2 pg (ref 26.0–34.0)
MCHC: 30.2 g/dL (ref 30.0–36.0)
MCV: 100 fL (ref 80.0–100.0)
Platelets: 505 10*3/uL — ABNORMAL HIGH (ref 150–400)
RBC: 3.21 MIL/uL — ABNORMAL LOW (ref 3.87–5.11)
RDW: 15.1 % (ref 11.5–15.5)
WBC: 12.7 10*3/uL — ABNORMAL HIGH (ref 4.0–10.5)
nRBC: 0 % (ref 0.0–0.2)

## 2022-02-06 LAB — CREATININE, SERUM
Creatinine, Ser: 0.52 mg/dL (ref 0.44–1.00)
GFR, Estimated: >60 mL/min (ref >60)

## 2022-02-06 MED ORDER — HEPARIN SODIUM (PORCINE) 5000 UNIT/ML IJ SOLN
5000.0000 [IU] | Freq: Three times a day (TID) | INTRAMUSCULAR | Status: AC
  Administered 2022-02-07 (×2): 5000 [IU] via SUBCUTANEOUS
  Filled 2022-02-06 (×3): qty 1

## 2022-02-06 MED ORDER — ACETAMINOPHEN 325 MG PO TABS
650.0000 mg | ORAL_TABLET | Freq: Four times a day (QID) | ORAL | Status: DC | PRN
  Administered 2022-02-07: 650 mg via ORAL
  Filled 2022-02-06 (×3): qty 2

## 2022-02-06 MED ORDER — DOCUSATE SODIUM 100 MG PO CAPS
100.0000 mg | ORAL_CAPSULE | Freq: Two times a day (BID) | ORAL | Status: DC
  Administered 2022-02-06 – 2022-02-07 (×3): 100 mg via ORAL
  Filled 2022-02-06 (×4): qty 1

## 2022-02-06 MED ORDER — LIDOCAINE 5 % EX PTCH
1.0000 | MEDICATED_PATCH | CUTANEOUS | Status: DC
  Administered 2022-02-06 – 2022-02-08 (×3): 1 via TRANSDERMAL
  Filled 2022-02-06 (×6): qty 1

## 2022-02-06 MED ORDER — INFLUENZA VAC SPLIT QUAD 0.5 ML IM SUSY
0.5000 mL | PREFILLED_SYRINGE | INTRAMUSCULAR | Status: DC
  Filled 2022-02-06: qty 0.5

## 2022-02-06 MED ORDER — CLONAZEPAM 0.5 MG PO TABS
0.5000 mg | ORAL_TABLET | Freq: Three times a day (TID) | ORAL | Status: DC
  Administered 2022-02-06 – 2022-02-28 (×63): 0.5 mg via ORAL
  Filled 2022-02-06 (×64): qty 1

## 2022-02-06 MED ORDER — POLYETHYLENE GLYCOL 3350 17 G PO PACK
17.0000 g | PACK | Freq: Every day | ORAL | Status: DC
  Administered 2022-02-07 – 2022-02-09 (×2): 17 g via ORAL
  Filled 2022-02-06 (×4): qty 1

## 2022-02-06 MED ORDER — MELATONIN 3 MG PO TABS
3.0000 mg | ORAL_TABLET | Freq: Every day | ORAL | Status: DC
  Administered 2022-02-06 – 2022-02-27 (×21): 3 mg via ORAL
  Filled 2022-02-06 (×23): qty 1

## 2022-02-06 MED ORDER — PNEUMOCOCCAL 20-VAL CONJ VACC 0.5 ML IM SUSY
0.5000 mL | PREFILLED_SYRINGE | INTRAMUSCULAR | Status: DC
  Filled 2022-02-06: qty 0.5

## 2022-02-06 MED ORDER — HEPARIN SODIUM (PORCINE) 5000 UNIT/ML IJ SOLN: 5000.0000 [IU] | Freq: Three times a day (TID) | INTRAMUSCULAR | Status: DC

## 2022-02-06 MED ORDER — MIDODRINE HCL 5 MG PO TABS
10.0000 mg | ORAL_TABLET | Freq: Three times a day (TID) | ORAL | Status: DC
  Administered 2022-02-07 – 2022-02-28 (×57): 10 mg via ORAL
  Filled 2022-02-06 (×59): qty 2

## 2022-02-06 MED ORDER — FAMOTIDINE 20 MG PO TABS
20.0000 mg | ORAL_TABLET | Freq: Two times a day (BID) | ORAL | Status: DC
  Administered 2022-02-06 – 2022-02-28 (×42): 20 mg via ORAL
  Filled 2022-02-06 (×43): qty 1

## 2022-02-06 NOTE — H&P (Signed)
Physical Medicine and Rehabilitation Admission H&P       HPI: Janet C Ln. is a 52 year old right-handed female with history of former tobacco use as well as polysubstance abuse.  Per chart review patient lives with her husband and brother.  Independent prior to admission.  1 level home 4 steps to entry.   Presented 12/22/2021 to [facility] with increasing shortness of breath and vomiting hypoxic 89% requiring intubation as well as mechanical ventilation.  Chest x-ray showed complete whiteout of left lung.  Bronchoscopy showed purulent secretions on the left which were suctioned out.  Blood culture showed Rothia Muciliaginosa, urine tested positive for Legionella, bronchoscopy positive for Pseudomonas and stenotrophomonas.  ID consulted placed on broad-spectrum antibiotics.  Hospital course complicated by AKI and bicarbonate drip was started.  Patient was transferred to [facility] 12/2 for ARDS management and possible need for ECMO.  Patient did receive ECMO.  Unable to wean off vent with tracheostomy placed 12/8.  She was transferred to [facility] 12/13 for ongoing ventilation care.  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  Patient has been decannulated and diet advanced dysphagia #2 thin liquids.  She has been weaned from oxygen.  AKI resolved latest creatinine 0.42.  She did have bouts of hypotension placed on midodrine.  Maintained on subcutaneous heparin for DVT prophylaxis.  Therapy evaluations ongoing patient with profound debility and was admitted for a comprehensive rehab program.   Pt reports a congested cough- said having greenish sputum. - LBM was 2 days ago- feels like needs to go.              Urinates using purewick usually.              Denies pain- no N/V.  When gets up with therapy- gets dizzy- not when turns head side to side.  Feels real weak in arms and legs.      Review of Systems  Constitutional:  Negative for chills and fever.   HENT: Negative.  Negative for hearing loss.   Eyes:  Negative for blurred vision and double vision.  Respiratory:  Positive for cough, hemoptysis, sputum production and shortness of breath. Negative for wheezing.   Cardiovascular:  Positive for leg swelling. Negative for chest pain and palpitations.  Gastrointestinal:  Positive for constipation and vomiting.  Genitourinary:  Negative for dysuria, flank pain and hematuria.  Musculoskeletal:  Positive for back pain, joint pain and myalgias.  Skin: Negative.  Negative for rash.  Neurological:  Positive for dizziness and weakness. Negative for tingling, speech change, seizures and headaches.  Endo/Heme/Allergies: Negative.   Psychiatric/Behavioral:  Negative for hallucinations and substance abuse. The patient is nervous/anxious.        Anxiety  All other systems reviewed and are negative.       Past Medical History:  Diagnosis Date   Asthma 01/15/2012         Past Surgical History:  Procedure Laterality Date   LEFT HEART CATH AND CORONARY ANGIOGRAPHY N/A 12/24/2021    Procedure: LEFT HEART CATH AND CORONARY ANGIOGRAPHY;  Surgeon: Nelva Bush, MD;  Location: [facility];  Service: Cardiovascular;  Laterality: N/A;    No family history on file. Social History:  has no history on file for tobacco use, alcohol use, and drug use. Allergies: No Known Allergies       Medications Prior to Admission  Medication Sig Dispense Refill   acetaminophen (TYLENOL) 160 MG/5ML solution  Take 20.3 mLs (650 mg total) by mouth every 6 (six) hours. 120 mL 0   clonazePAM (KLONOPIN) 0.25 MG disintegrating tablet Place 1 tablet (0.25 mg total) into feeding tube at bedtime. Take 0.25mg  daily at bedtime for 5 days then take daily as needed for anxiety or sleep 5 tablet 0   cyclobenzaprine (FLEXERIL) 10 MG tablet Take 10 mg by mouth 3 (three) times daily as needed for muscle spasms.       famotidine (PEPCID) 20 MG tablet Place 1 tablet (20 mg total)  into feeding tube 2 (two) times daily. 1 tablet 0   fiber supplement, BANATROL TF, liquid Place 60 mLs into feeding tube 2 (two) times daily.       heparin 5000 UNIT/ML injection Inject 1 mL (5,000 Units total) into the skin every 8 (eight) hours. 1 mL     insulin aspart (NOVOLOG) 100 UNIT/ML injection Inject 2-6 Units into the skin every 4 (four) hours. 10 mL 11   ipratropium-albuterol (DUONEB) 0.5-2.5 (3) MG/3ML SOLN Take 3 mLs by nebulization every 4 (four) hours as needed. 360 mL     levofloxacin (LEVAQUIN) 750 MG/150ML SOLN Inject 150 mLs (750 mg total) into the vein daily. 1000 mL     midodrine (PROAMATINE) 10 MG tablet Place 1 tablet (10 mg total) into feeding tube every 8 (eight) hours.       oxyCODONE 7.5 MG TABS Place 5 mg into feeding tube every 6 (six) hours. Decrease dose or frequency over the next few days with the goal for discontinuation as tolerated 30 tablet 0   Water For Irrigation, Sterile (FREE WATER) SOLN Place 250 mLs into feeding tube every 4 (four) hours.              Home: Home Living Type of Home: Mobile home Home Access: Stairs to enter Entrance Stairs-Number of Steps: 4 Entrance Stairs-Rails: Left Home Layout: One level Bathroom Shower/Tub: Optometrist: Yes  Lives With: Spouse, Other (Comment)   Functional History: Independent prior to admission   Functional Status:  Mobility: Min mod assist mobility   ADL: Min mod assist for ADLs   Cognition:   Physical Exam: 104/60 pulse 72 temperature 98 respirations 18 oxygen saturation 92% room air Last menstrual period 01/16/2016. Physical Exam Vitals and nursing note reviewed.  Constitutional:      Appearance: Normal appearance. She is obese.     Comments: Awake, alert, sitting up in bed; appropriate, NAD  HENT:     Head: Normocephalic and atraumatic.     Comments: No facial droop    Right Ear: External ear normal.     Left Ear: External ear normal.      Nose: Nose normal. No congestion.     Mouth/Throat:     Mouth: Mucous membranes are dry.     Pharynx: Oropharynx is clear. No oropharyngeal exudate.  Eyes:     General:        Right eye: No discharge.        Left eye: No discharge.     Extraocular Movements: Extraocular movements intact.  Neck:     Comments: Dressing C/D/I from trach/decannulated- small scab- no air leak- small hole seen, but scabbed Cardiovascular:     Rate and Rhythm: Regular rhythm. Tachycardia present.     Heart sounds: Normal heart sounds. No murmur heard.    No gallop.     Comments: Rate 105-107 Pulmonary:     Comments: Very coarse cough-  with light greenish/yellow sputum Decreased breath sounds- mainly at bases- no W/R/R Abdominal:     General: Bowel sounds are normal. There is no distension.     Palpations: Abdomen is soft.     Tenderness: There is no abdominal tenderness.  Genitourinary:    Comments: Purewick in place, medium to dark- amber urine Musculoskeletal:     Cervical back: Neck supple. No tenderness.     Comments: Ues 4/5 in biceps, triceps; and 4-/5 in WE, grip and FA B/L LE's- HF 3+/5; KE 4-/5; KF 3+/5; DF 3+/5; PF 4/5 B/L  Skin:    General: Skin is warm and dry.     Comments: Stage II on L inner buttock- looks like from [place]- where it was resting Skin just abraded/slightly open- a little erythematous  Neurological:     General: No focal deficit present.     Mental Status: She is alert and oriented to person, place, and time.     Comments: Patient is resting comfortably.  Makes eye contact with examiner.  Provides name and age.  Follows simple commands.  She cannot recall her full hospital course. Oriented x3- fair to good historian Intact to light touch in all 4 extremities  Psychiatric:        Behavior: Behavior normal.     Comments: Flat affect        Lab Results Last 48 Hours  No results found for this or any previous visit (from the past 48 hour(s)).   Imaging Results  (Last 48 hours)  No results found.         Last menstrual period 01/16/2016.   Medical Problem List and Plan: 1. 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  Antithrombotics: -DVT/anticoagulation:  Pharmaceutical: Heparin- will try and change to Lovenox- as long as labs in AM are OK             -antiplatelet therapy: N/A 3. Pain Management: Lidoderm patch, Tylenol as needed 4. Mood/Behavior/Sleep:              -antipsychotic agents: Clonazepam 0.5 mg 3 times daily, melatonin 3 mg nightly 5. Neuropsych/cognition: This patient is capable of making decisions on her own behalf. 6. Skin/Wound Care: Routine skin checks 7. Fluids/Electrolytes/Nutrition: Routine in and outs with follow-up chemistries 8.  Respiratory failure/tracheostomy.  Decannulated 1 week ago- no air leak.  Check oxygen saturations every shift- off O2- current sats 96-97% 9.  AKI.  Resolved.  Latest creatinine 0.42.  Follow-up chemistries 10.  History of tobacco polysubstance use.  Provide counseling 11.  Prediabetes.  Hemoglobin A1c 6.1.  Blood sugar checks discontinued 12.  Dysphagia.  Dysphagia #2 thin liquids.  Follow-up speech therapy 13. Stage II L inner buttock pressure eulcer- from [place] - will do foam dressing and monitor clinically -stop using Purewick- explained to pt.  14. Constipation- LBM 2 days ago- if doesn't go in 1 day, will intervene 15. Coarse cough with light green sputum- will check f/u CXR   I have personally performed a face to face diagnostic evaluation of this patient and formulated the key components of the plan.  Additionally, I have personally reviewed laboratory data, imaging studies, as well as relevant notes and concur with the physician assistant's documentation above.  The patient's status has not changed from the original H&P.  Any changes in documentation from  the acute care chart have been noted above.       Mcarthur Rossetti Angiulli, PA-C 02/06/2022

## 2022-02-06 NOTE — PMR Pre-admission (Signed)
PMR Admission Coordinator Pre-Admission Assessment  Patient: Janet Hensley is an 52 y.o., female MRN: [id number] DOB: Aug 26, 1970 Height: 5'3" Weight: 83.3 kg  Insurance Information HMO:  yes   PPO:      PCP:      IPA:      80/20:      OTHER:  PRIMARY: Comptroller       Policy#: [id number]      Subscriber: Pt CM Name: Cassie      Phone#: [phone]   Fax#: [phone] Pre-Cert#: [id number] from 02/06/22 to 02/11/22 with update due to fax [phone] on 02/11/22      Employer:  Benefits:  Phone #: [phone]     Name:  Irene Shipper Date: 01/27/2022 - still active Deductible: $7,500 ($0 met) OOP Max: $9,400 ($0 met) CIR: 50% coverage; 50% co-insurance SNF: 50% coverage; 50% co-insurance; limited to 90 days/cal yr (90 remaining) Outpatient:  $50 copay/visit Home Health:  $50 copay/visit DME: 50% coverage; 50% co-insurance Providers: in network   SECONDARY: Var[employer] Medicaid [place]       Policy#: [id number]    Phone#: [phone] Pre-cert # : [id number] with auth from 02/06/22 to 02/12/22 with update due on 02/12/22 to fax [phone]   Financial Counselor:       Phone#:   The "Data Collection Information Summary" for patients in Inpatient Rehabilitation Facilities with attached "Privacy Act [place] Records" was provided and verbally reviewed with: N/A  Emergency Contact Information Contact Information     Name Relation Home Work Mobile   Marasigan,Chris Spouse   [phone]       Current Medical History  Patient Admitting Diagnosis: Respiratory Failure, Debility, Bactremia   History of Present Illness: Pt. Is a 52 year old smoker presented to [facility] 12/22/21 with cough, shortness of breath nausea and vomiting, hypoxic to 89%, requiring.  intubation for mechanical ventilation. Chest x-ray at the time showed complete whiteout of left lung.  Bronchoscopy showed purulent secretions on the left which were suctioned out. Blood cultures showed Rothia Mucilaginosa,  urine tested positive for Legionella, bronchoscopy was positive for Pseudomonas and stenotrophomonas . ID consult was obtained she was treated with cefepime and levofloxacin and nebulized tobramycin . Course was complicated by AKI and bicarbonate drip was started. She was transferred to [facility] on 12/28/21 for ARDS management and possible need for ECMO. Patient did receive ECMO. Unable to wean off vent and tracheostomy placed 01/03/22. Occasionally had soft BP and required low dose levo. Was started on midodrine for soft BP and off levo. Transferring to [facility] on 01/08/22. Pt. Completed Levaquin 01/09/22. Pt. Also with AKI due to septic acute tubular necrosis, but that resolved. She was was weaned from the vent and ultimately decannulated 01/31/22. IV merepenem to end 02/04/22. Pt with some dysphagia  and malnutrition and had coretrak placed during acute admission. This was discontinued while at [facility]. She is currently on a dysphagia 2 diet with thin liquids, per SLP recommendations. PT/OT/SLP worked with patient during stay at [facility] and they recommend CIR to assist with return to PLOF.   Patient's medical record from [facility]  has been reviewed by the rehabilitation admission coordinator and physician.  Past Medical History  Past Medical History:  Diagnosis Date   Asthma 01/15/2012    Has the patient had major surgery during 100 days prior to admission? Yes  Family History   family history is not on file.  Current Medications No current facility-administered medications for this encounter. No  current outpatient medications on file.  Patients Current Diet: Diet Dysphagia 2, Thin   Precautions / Restrictions     Has the patient had 2 or more falls or a fall with injury in the past year? No  Prior Activity Level Community (5-7x/wk): Pt. acitve in the community PTA   Prior Functional Level Self Care: Did the patient need help bathing, dressing,  using the toilet or eating? Independent  Indoor Mobility: Did the patient need assistance with walking from room to room (with or without device)? Independent  Stairs: Did the patient need assistance with internal or external stairs (with or without device)? Independent  Functional Cognition: Did the patient need help planning regular tasks such as shopping or remembering to take medications? Independent  Patient Information    Patient's Response To:     Home Assistive Devices / Equipment    Prior Device Use: Indicate devices/aids used by the patient prior to current illness, exacerbation or injury? None of the above   Prior Functional Level Current Functional Level  Bed Mobility  Independent Mod assist   Transfers  Independent  Max assist   Mobility - Walk/Wheelchair  Independent  Total assist   Upper Body Dressing  Independent  Other   Lower Body Dressing  Independent  Max assist   Grooming  Independent  Mod assist   Eating/Drinking  Independent  Min assist   Toilet Transfer  Independent  Mod Independent   Bladder Continence   Continent  continent   Bowel Management  Continent  continent   Stair Climbing  Independent  Independent   Communication  Independent  min A   Memory  Independent min A     Special Needs/ Care Considerations Skin stoma from trach, removed  Previous Home Environment (from acute therapy documentation)  Lives With: Spouse; Other (Comment) Type of Home: Mobile home Home Layout: One level Home Access: Stairs to enter Entrance Stairs-Rails: Left Entrance Stairs-Number of Steps: 4 Bathroom Shower/Tub: Government social research officer Accessibility: Yes How Accessible: Accessible via wheelchair; Accessible via walker   Discharge Living Setting Plans for Discharge Living Setting: Patient's home Type of Home at Discharge: House Discharge Home Layout: One level Discharge Home Access: Stairs to  enter Entrance Stairs-Rails: Left Entrance Stairs-Number of Steps: 4 Discharge Bathroom Shower/Tub: Tub/shower unit Discharge Bathroom Toilet: Standard Discharge Bathroom Accessibility: Yes How Accessible: Accessible via walker; Accessible via wheelchair   Social/Family/Support Systems Patient Roles: Other (Comment) Contact Information: [phone] Anticipated Caregiver: Brother John Anticipated Caregiver's Contact Information: Husband works, brother in the home 24/7 Ability/Limitations of Caregiver: none Caregiver Availability: 24/7 Discharge Plan Discussed with Primary Caregiver: Yes Is Caregiver In Agreement with Plan?: Yes Does Caregiver/Family have Issues with Lodging/Transportation while Pt is in Rehab?: No   Goals Patient/Family Goal for Rehab: PT/OT Min A, SLP Mod I Expected length of stay: 18-21 days Pt/Family Agrees to Admission and willing to participate: Yes Program Orientation Provided & Reviewed with Pt/Caregiver Including Roles  & Responsibilities: Yes   Decrease burden of Care through IP rehab admission: None   Possible need for SNF placement upon discharge: not anticipated  Patient Condition: I have reviewed medical records from [facility] , spoken with CM, and patient and family member. I met with patient at the bedside for inpatient rehabilitation assessment.  Patient will benefit from ongoing PT, OT, and SLP, can actively participate in 3 hours of therapy a day 5 days of the week, and can make measurable gains during the admission.  Patient will also benefit from the coordinated team approach during an Inpatient Acute Rehabilitation admission.  The patient will receive intensive therapy as well as Rehabilitation physician, nursing, social worker, and care management interventions.  Due to safety, skin/wound care, disease management, medication administration, pain management, and patient education the patient requires 24 hour a day rehabilitation  nursing.  The patient is currently Max A with mobility and basic ADLs.  Discharge setting and therapy post discharge at home with home health is anticipated.  Patient has agreed to participate in the Acute Inpatient Rehabilitation Program and will admit today.  Preadmission Screen Completed By:  Genice Rouge, 02/06/2022 1:58 PM ______________________________________________________________________   Discussed status with Dr. Berline Chough on 02/06/22 at 1:00 pm and received approval for admission today.  Admission Coordinator:  Genice Rouge, MD, time 1:54 pm/Date 02/06/22   Assessment/Plan: Diagnosis: Does the need for close, 24 hr/day Medical supervision in concert with the patient's rehab needs make it unreasonable for this patient to be served in a less intensive setting? Yes Co-Morbidities requiring supervision/potential complications: Legionella, pseudomonas pneumonia; and AKI/ATN; received ECMO and was on vent- s/p trach; dysphagia; D2 thin liquids; ICU myopathy Due to bladder management, bowel management, safety, skin/wound care, disease management, medication administration, pain management, and patient education, does the patient require 24 hr/day rehab nursing? Yes Does the patient require coordinated care of a physician, rehab nurse, PT, OT, and SLP to address physical and functional deficits in the context of the above medical diagnosis(es)? Yes Addressing deficits in the following areas: balance, endurance, locomotion, strength, transferring, bowel/bladder control, bathing, dressing, feeding, grooming, toileting, and swallowing Can the patient actively participate in an intensive therapy program of at least 3 hrs of therapy 5 days a week? Yes The potential for patient to make measurable gains while on inpatient rehab is good and fair Anticipated functional outcomes upon discharge from inpatient rehab: min assist PT, min assist OT, modified independent SLP Estimated rehab length of stay to reach  the above functional goals is: 18-21 days Anticipated discharge destination: Home 10. Overall Rehab/Functional Prognosis: good and fair  MD Signature

## 2022-02-06 NOTE — H&P (Signed)
Physical Medicine and Rehabilitation Admission H&P     HPI: Janet C Ln. is a 52 year old right-handed female with history of former tobacco use as well as polysubstance abuse.  Per chart review patient lives with her husband and brother.  Independent prior to admission.  1 level home 4 steps to entry.   Presented 12/22/2021 to [facility] with increasing shortness of breath and vomiting hypoxic 89% requiring intubation as well as mechanical ventilation.  Chest x-ray showed complete whiteout of left lung.  Bronchoscopy showed purulent secretions on the left which were suctioned out.  Blood culture showed Rothia Muciliaginosa, urine tested positive for Legionella, bronchoscopy positive for Pseudomonas and stenotrophomonas.  ID consulted placed on broad-spectrum antibiotics.  Hospital course complicated by AKI and bicarbonate drip was started.  Patient was transferred to [facility] 12/2 for ARDS management and possible need for ECMO.  Patient did receive ECMO.  Unable to wean off vent with tracheostomy placed 12/8.  She was transferred to [facility] 12/13 for ongoing ventilation care.  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  Patient has been decannulated and diet advanced dysphagia #2 thin liquids.  She has been weaned from oxygen.  AKI resolved latest creatinine 0.42.  She did have bouts of hypotension placed on midodrine.  Maintained on subcutaneous heparin for DVT prophylaxis.  Therapy evaluations ongoing patient with profound debility and was admitted for a comprehensive rehab program.  Pt reports a congested cough- said having greenish sputum. - LBM was 2 days ago- feels like needs to go.   Urinates using purewick usually.   Denies pain- no N/V.  When gets up with therapy- gets dizzy- not when turns head side to side.  Feels real weak in arms and legs.    Review of Systems  Constitutional:  Negative for chills and fever.  HENT: Negative.  Negative  for hearing loss.   Eyes:  Negative for blurred vision and double vision.  Respiratory:  Positive for cough, hemoptysis, sputum production and shortness of breath. Negative for wheezing.   Cardiovascular:  Positive for leg swelling. Negative for chest pain and palpitations.  Gastrointestinal:  Positive for constipation and vomiting.  Genitourinary:  Negative for dysuria, flank pain and hematuria.  Musculoskeletal:  Positive for back pain, joint pain and myalgias.  Skin: Negative.  Negative for rash.  Neurological:  Positive for dizziness and weakness. Negative for tingling, speech change, seizures and headaches.  Endo/Heme/Allergies: Negative.   Psychiatric/Behavioral:  Negative for hallucinations and substance abuse. The patient is nervous/anxious.        Anxiety  All other systems reviewed and are negative.  Past Medical History:  Diagnosis Date   Asthma 01/15/2012   Past Surgical History:  Procedure Laterality Date   LEFT HEART CATH AND CORONARY ANGIOGRAPHY N/A 12/24/2021   Procedure: LEFT HEART CATH AND CORONARY ANGIOGRAPHY;  Surgeon: Yvonne Kendall, MD;  Location: [facility] INVASIVE CV LAB;  Service: Cardiovascular;  Laterality: N/A;   No family history on file. Social History:  has no history on file for tobacco use, alcohol use, and drug use. Allergies: No Known Allergies Medications Prior to Admission  Medication Sig Dispense Refill   acetaminophen (TYLENOL) 160 MG/5ML solution Take 20.3 mLs (650 mg total) by mouth every 6 (six) hours. 120 mL 0   clonazePAM (KLONOPIN) 0.25 MG disintegrating tablet Place 1 tablet (0.25 mg total) into feeding tube at bedtime. Take 0.25mg  daily at bedtime for 5 days then take daily as needed for  anxiety or sleep 5 tablet 0   cyclobenzaprine (FLEXERIL) 10 MG tablet Take 10 mg by mouth 3 (three) times daily as needed for muscle spasms.     famotidine (PEPCID) 20 MG tablet Place 1 tablet (20 mg total) into feeding tube 2 (two) times daily. 1 tablet 0    fiber supplement, BANATROL TF, liquid Place 60 mLs into feeding tube 2 (two) times daily.     heparin 5000 UNIT/ML injection Inject 1 mL (5,000 Units total) into the skin every 8 (eight) hours. 1 mL    insulin aspart (NOVOLOG) 100 UNIT/ML injection Inject 2-6 Units into the skin every 4 (four) hours. 10 mL 11   ipratropium-albuterol (DUONEB) 0.5-2.5 (3) MG/3ML SOLN Take 3 mLs by nebulization every 4 (four) hours as needed. 360 mL    levofloxacin (LEVAQUIN) 750 MG/150ML SOLN Inject 150 mLs (750 mg total) into the vein daily. 1000 mL    midodrine (PROAMATINE) 10 MG tablet Place 1 tablet (10 mg total) into feeding tube every 8 (eight) hours.     oxyCODONE 7.5 MG TABS Place 5 mg into feeding tube every 6 (six) hours. Decrease dose or frequency over the next few days with the goal for discontinuation as tolerated 30 tablet 0   Water For Irrigation, Sterile (FREE WATER) SOLN Place 250 mLs into feeding tube every 4 (four) hours.        Home: Home Living Type of Home: Mobile home Home Access: Stairs to enter Entrance Stairs-Number of Steps: 4 Entrance Stairs-Rails: Left Home Layout: One level Bathroom Shower/Tub: Optometrist: Yes  Lives With: Spouse, Other (Comment)   Functional History: Independent prior to admission    Functional Status:  Mobility: Min mod assist mobility          ADL: Min mod assist for ADLs    Cognition:      Physical Exam: 104/60 pulse 72 temperature 98 respirations 18 oxygen saturation 92% room air Last menstrual period 01/16/2016. Physical Exam Vitals and nursing note reviewed.  Constitutional:      Appearance: Normal appearance. She is obese.     Comments: Awake, alert, sitting up in bed; appropriate, NAD  HENT:     Head: Normocephalic and atraumatic.     Comments: No facial droop    Right Ear: External ear normal.     Left Ear: External ear normal.     Nose: Nose normal. No congestion.      Mouth/Throat:     Mouth: Mucous membranes are dry.     Pharynx: Oropharynx is clear. No oropharyngeal exudate.  Eyes:     General:        Right eye: No discharge.        Left eye: No discharge.     Extraocular Movements: Extraocular movements intact.  Neck:     Comments: Dressing C/D/I from trach/decannulated- small scab- no air leak- small hole seen, but scabbed Cardiovascular:     Rate and Rhythm: Regular rhythm. Tachycardia present.     Heart sounds: Normal heart sounds. No murmur heard.    No gallop.     Comments: Rate 105-107 Pulmonary:     Comments: Very coarse cough- with light greenish/yellow sputum Decreased breath sounds- mainly at bases- no W/R/R Abdominal:     General: Bowel sounds are normal. There is no distension.     Palpations: Abdomen is soft.     Tenderness: There is no abdominal tenderness.  Genitourinary:    Comments: Purewick  in place, medium to dark- amber urine Musculoskeletal:     Cervical back: Neck supple. No tenderness.     Comments: Ues 4/5 in biceps, triceps; and 4-/5 in WE, grip and FA B/L LE's- HF 3+/5; KE 4-/5; KF 3+/5; DF 3+/5; PF 4/5 B/L  Skin:    General: Skin is warm and dry.     Comments: Stage II on L inner buttock- looks like from [place]- where it was resting Skin just abraded/slightly open- a little erythematous  Neurological:     General: No focal deficit present.     Mental Status: She is alert and oriented to person, place, and time.     Comments: Patient is resting comfortably.  Makes eye contact with examiner.  Provides name and age.  Follows simple commands.  She cannot recall her full hospital course. Oriented x3- fair to good historian Intact to light touch in all 4 extremities  Psychiatric:        Behavior: Behavior normal.     Comments: Flat affect     No results found for this or any previous visit (from the past 48 hour(s)). No results found.    Last menstrual period 01/16/2016.  Medical Problem List and  Plan: 1. 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  Antithrombotics: -DVT/anticoagulation:  Pharmaceutical: Heparin- will try and change to Lovenox- as long as labs in AM are OK  -antiplatelet therapy: N/A 3. Pain Management: Lidoderm patch, Tylenol as needed 4. Mood/Behavior/Sleep:   -antipsychotic agents: Clonazepam 0.5 mg 3 times daily, melatonin 3 mg nightly 5. Neuropsych/cognition: This patient is capable of making decisions on her own behalf. 6. Skin/Wound Care: Routine skin checks 7. Fluids/Electrolytes/Nutrition: Routine in and outs with follow-up chemistries 8.  Respiratory failure/tracheostomy.  Decannulated 1 week ago- no air leak.  Check oxygen saturations every shift- off O2- current sats 96-97% 9.  AKI.  Resolved.  Latest creatinine 0.42.  Follow-up chemistries 10.  History of tobacco polysubstance use.  Provide counseling 11.  Prediabetes.  Hemoglobin A1c 6.1.  Blood sugar checks discontinued 12.  Dysphagia.  Dysphagia #2 thin liquids.  Follow-up speech therapy 13. Stage II L inner buttock pressure eulcer- from [place] - will do foam dressing and monitor clinically -stop using Purewick- explained to pt.  14. Constipation- LBM 2 days ago- if doesn't go in 1 day, will intervene 15. Coarse cough with light green sputum- will check f/u CXR  I have personally performed a face to face diagnostic evaluation of this patient and formulated the key components of the plan.  Additionally, I have personally reviewed laboratory data, imaging studies, as well as relevant notes and concur with the physician assistant's documentation above.   The patient's status has not changed from the original H&P.  Any changes in documentation from the acute care chart have been noted above.     Lavon Paganini Angiulli, PA-C 02/06/2022

## 2022-02-06 NOTE — Progress Notes (Deleted)
PMR Admission Coordinator Pre-Admission Assessment  Patient: Janet Hensley is an 51 y.o., female MRN: [id number] DOB: 03/23/1970 Height: 5'3" Weight: 83.3 kg  Insurance Information HMO:  yes   PPO:      PCP:      IPA:      80/20:      OTHER:  PRIMARY: [employer]       Policy#: [id number]      Subscriber: Pt CM Name: Cassie      Phone#: [phone]   Fax#: [phone] Pre-Cert#: [id number] from 02/06/22 to 02/11/22 with update due to fax [phone] on 02/11/22      Employer:  Benefits:  Phone #: [phone]     Name:  Eff Date: 01/27/2022 - still active Deductible: $7,500 ($0 met) OOP Max: $9,400 ($0 met) CIR: 50% coverage; 50% co-insurance SNF: 50% coverage; 50% co-insurance; limited to 90 days/cal yr (90 remaining) Outpatient:  $50 copay/visit Home Health:  $50 copay/visit DME: 50% coverage; 50% co-insurance Providers: in network   SECONDARY: [employer] [place]       Policy#: [id number]    Phone#: [phone] Pre-cert # : [id number] with auth from 02/06/22 to 02/12/22 with update due on 02/12/22 to fax [phone]   Financial Counselor:       Phone#:   The "Data Collection Information Summary" for patients in Inpatient Rehabilitation Facilities with attached "Privacy Act Statement-Health Care Records" was provided and verbally reviewed with: N/A  Emergency Contact Information Contact Information     Name Relation Home Work Mobile   Buehler,Chris Spouse   [phone]       Current Medical History  Patient Admitting Diagnosis: Respiratory Failure, Debility, Bactremia   History of Present Illness: Pt. Is a 51-year-old smoker presented to [facility] 12/22/21 with cough, shortness of breath nausea and vomiting, hypoxic to 89%, requiring.  intubation for mechanical ventilation. Chest x-ray at the time showed complete whiteout of left lung.  Bronchoscopy showed purulent secretions on the left which were suctioned out. Blood cultures showed Rothia Mucilaginosa,  urine tested positive for Legionella, bronchoscopy was positive for Pseudomonas and stenotrophomonas . ID consult was obtained she was treated with cefepime and levofloxacin and nebulized tobramycin . Course was complicated by AKI and bicarbonate drip was started. She was transferred to [facility] on 12/28/21 for ARDS management and possible need for ECMO. Patient did receive ECMO. Unable to wean off vent and tracheostomy placed 01/03/22. Occasionally had soft BP and required low dose levo. Was started on midodrine for soft BP and off levo. Transferring to [facility] on 01/08/22. Pt. Completed Levaquin 01/09/22. Pt. Also with AKI due to septic acute tubular necrosis, but that resolved. She was was weaned from the vent and ultimately decannulated 01/31/22. IV merepenem to end 02/04/22. Pt with some dysphagia  and malnutrition and had coretrak placed during acute admission. This was discontinued while at [facility]. She is currently on a dysphagia 2 diet with thin liquids, per SLP recommendations. PT/OT/SLP worked with patient during stay at [facility] and they recommend CIR to assist with return to PLOF.   Patient's medical record from [facility]  has been reviewed by the rehabilitation admission coordinator and physician.  Past Medical History  Past Medical History:  Diagnosis Date   Asthma 01/15/2012    Has the patient had major surgery during 100 days prior to admission? Yes  Family History   family history is not on file.  Current Medications No current facility-administered medications for this encounter. No   current outpatient medications on file.  Patients Current Diet: Diet Dysphagia 2, Thin   Precautions / Restrictions     Has the patient had 2 or more falls or a fall with injury in the past year? No  Prior Activity Level Community (5-7x/wk): Pt. acitve in the community PTA   Prior Functional Level Self Care: Did the patient need help bathing, dressing,  using the toilet or eating? Independent  Indoor Mobility: Did the patient need assistance with walking from room to room (with or without device)? Independent  Stairs: Did the patient need assistance with internal or external stairs (with or without device)? Independent  Functional Cognition: Did the patient need help planning regular tasks such as shopping or remembering to take medications? Independent  Patient Information    Patient's Response To:     Home Assistive Devices / Equipment    Prior Device Use: Indicate devices/aids used by the patient prior to current illness, exacerbation or injury? None of the above   Prior Functional Level Current Functional Level  Bed Mobility  Independent Mod assist   Transfers  Independent  Max assist   Mobility - Walk/Wheelchair  Independent  Total assist   Upper Body Dressing  Independent  Other   Lower Body Dressing  Independent  Max assist   Grooming  Independent  Mod assist   Eating/Drinking  Independent  Min assist   Toilet Transfer  Independent  Mod Independent   Bladder Continence   Continent  continent   Bowel Management  Continent  continent   Stair Climbing  Independent  Independent   Communication  Independent  min A   Memory  Independent min A     Special Needs/ Care Considerations Skin stoma from trach, removed  Previous Home Environment (from acute therapy documentation)  Lives With: Spouse; Other (Comment) Type of Home: Mobile home Home Layout: One level Home Access: Stairs to enter Entrance Stairs-Rails: Left Entrance Stairs-Number of Steps: 4 Bathroom Shower/Tub: Tub/shower unit Bathroom Toilet: Standard Bathroom Accessibility: Yes How Accessible: Accessible via wheelchair; Accessible via walker   Discharge Living Setting Plans for Discharge Living Setting: Patient's home Type of Home at Discharge: House Discharge Home Layout: One level Discharge Home Access: Stairs to  enter Entrance Stairs-Rails: Left Entrance Stairs-Number of Steps: 4 Discharge Bathroom Shower/Tub: Tub/shower unit Discharge Bathroom Toilet: Standard Discharge Bathroom Accessibility: Yes How Accessible: Accessible via walker; Accessible via wheelchair   Social/Family/Support Systems Patient Roles: Other (Comment) Contact Information: [phone] Anticipated Caregiver: Brother John Anticipated Caregiver's Contact Information: Husband works, brother in the home 24/7 Ability/Limitations of Caregiver: none Caregiver Availability: 24/7 Discharge Plan Discussed with Primary Caregiver: Yes Is Caregiver In Agreement with Plan?: Yes Does Caregiver/Family have Issues with Lodging/Transportation while Pt is in Rehab?: No   Goals Patient/Family Goal for Rehab: PT/OT Min A, SLP Mod I Expected length of stay: 18-21 days Pt/Family Agrees to Admission and willing to participate: Yes Program Orientation Provided & Reviewed with Pt/Caregiver Including Roles  & Responsibilities: Yes   Decrease burden of Care through IP rehab admission: None   Possible need for SNF placement upon discharge: not anticipated  Patient Condition: I have reviewed medical records from [facility] , spoken with CM, and patient and family member. I met with patient at the bedside for inpatient rehabilitation assessment.  Patient will benefit from ongoing PT, OT, and SLP, can actively participate in 3 hours of therapy a day 5 days of the week, and can make measurable gains during the admission.    Patient will also benefit from the coordinated team approach during an Inpatient Acute Rehabilitation admission.  The patient will receive intensive therapy as well as Rehabilitation physician, nursing, social worker, and care management interventions.  Due to safety, skin/wound care, disease management, medication administration, pain management, and patient education the patient requires 24 hour a day rehabilitation  nursing.  The patient is currently Max A with mobility and basic ADLs.  Discharge setting and therapy post discharge at home with home health is anticipated.  Patient has agreed to participate in the Acute Inpatient Rehabilitation Program and will admit today.  Preadmission Screen Completed By:  Megan Lovorn, 02/06/2022 1:58 PM ______________________________________________________________________   Discussed status with Dr. Lovorn on 02/06/22 at 1:00 pm and received approval for admission today.  Admission Coordinator:  Megan Lovorn, MD, time 1:54 pm/Date 02/06/22   Assessment/Plan: Diagnosis: Does the need for close, 24 hr/day Medical supervision in concert with the patient's rehab needs make it unreasonable for this patient to be served in a less intensive setting? Yes Co-Morbidities requiring supervision/potential complications: Legionella, pseudomonas pneumonia; and AKI/ATN; received ECMO and was on vent- s/p trach; dysphagia; D2 thin liquids; ICU myopathy Due to bladder management, bowel management, safety, skin/wound care, disease management, medication administration, pain management, and patient education, does the patient require 24 hr/day rehab nursing? Yes Does the patient require coordinated care of a physician, rehab nurse, PT, OT, and SLP to address physical and functional deficits in the context of the above medical diagnosis(es)? Yes Addressing deficits in the following areas: balance, endurance, locomotion, strength, transferring, bowel/bladder control, bathing, dressing, feeding, grooming, toileting, and swallowing Can the patient actively participate in an intensive therapy program of at least 3 hrs of therapy 5 days a week? Yes The potential for patient to make measurable gains while on inpatient rehab is good and fair Anticipated functional outcomes upon discharge from inpatient rehab: min assist PT, min assist OT, modified independent SLP Estimated rehab length of stay to reach  the above functional goals is: 18-21 days Anticipated discharge destination: Home 10. Overall Rehab/Functional Prognosis: good and fair  MD Signature  

## 2022-02-07 DIAGNOSIS — G7281 Critical illness myopathy: Secondary | ICD-10-CM | POA: Diagnosis not present

## 2022-02-07 LAB — CBC WITH DIFFERENTIAL/PLATELET
Abs Immature Granulocytes: 0.22 10*3/uL — ABNORMAL HIGH (ref 0.00–0.07)
Basophils Absolute: 0 10*3/uL (ref 0.0–0.1)
Basophils Relative: 0 %
Eosinophils Absolute: 0.1 10*3/uL (ref 0.0–0.5)
Eosinophils Relative: 1 %
HCT: 29.6 % — ABNORMAL LOW (ref 36.0–46.0)
Hemoglobin: 9.1 g/dL — ABNORMAL LOW (ref 12.0–15.0)
Immature Granulocytes: 2 %
Lymphocytes Relative: 18 %
Lymphs Abs: 2 10*3/uL (ref 0.7–4.0)
MCH: 31 pg (ref 26.0–34.0)
MCHC: 30.7 g/dL (ref 30.0–36.0)
MCV: 100.7 fL — ABNORMAL HIGH (ref 80.0–100.0)
Monocytes Absolute: 1.1 10*3/uL — ABNORMAL HIGH (ref 0.1–1.0)
Monocytes Relative: 10 %
Neutro Abs: 7.6 10*3/uL (ref 1.7–7.7)
Neutrophils Relative %: 69 %
Platelets: 469 10*3/uL — ABNORMAL HIGH (ref 150–400)
RBC: 2.94 MIL/uL — ABNORMAL LOW (ref 3.87–5.11)
RDW: 15.1 % (ref 11.5–15.5)
WBC: 11.2 10*3/uL — ABNORMAL HIGH (ref 4.0–10.5)
nRBC: 0 % (ref 0.0–0.2)

## 2022-02-07 LAB — COMPREHENSIVE METABOLIC PANEL
ALT: 21 U/L (ref 0–44)
AST: 14 U/L — ABNORMAL LOW (ref 15–41)
Albumin: 2.2 g/dL — ABNORMAL LOW (ref 3.5–5.0)
Alkaline Phosphatase: 144 U/L — ABNORMAL HIGH (ref 38–126)
Anion gap: 10 (ref 5–15)
BUN: 13 mg/dL (ref 6–20)
CO2: 23 mmol/L (ref 22–32)
Calcium: 10.7 mg/dL — ABNORMAL HIGH (ref 8.9–10.3)
Chloride: 102 mmol/L (ref 98–111)
Creatinine, Ser: 0.5 mg/dL (ref 0.44–1.00)
GFR, Estimated: >60 mL/min (ref >60)
Glucose, Bld: 110 mg/dL — ABNORMAL HIGH (ref 70–99)
Potassium: 3.2 mmol/L — ABNORMAL LOW (ref 3.5–5.1)
Sodium: 135 mmol/L (ref 135–145)
Total Bilirubin: 0.4 mg/dL (ref 0.3–1.2)
Total Protein: 7.8 g/dL (ref 6.5–8.1)

## 2022-02-07 MED ORDER — POTASSIUM CHLORIDE CRYS ER 20 MEQ PO TBCR
20.0000 meq | EXTENDED_RELEASE_TABLET | Freq: Two times a day (BID) | ORAL | Status: DC
  Administered 2022-02-07 (×2): 20 meq via ORAL
  Filled 2022-02-07 (×3): qty 1

## 2022-02-07 MED ORDER — OXYCODONE HCL 5 MG PO TABS
5.0000 mg | ORAL_TABLET | Freq: Four times a day (QID) | ORAL | Status: DC | PRN
  Administered 2022-02-07 – 2022-02-08 (×4): 5 mg via ORAL
  Filled 2022-02-07 (×5): qty 1

## 2022-02-07 MED ORDER — ENOXAPARIN SODIUM 40 MG/0.4ML IJ SOSY
40.0000 mg | PREFILLED_SYRINGE | INTRAMUSCULAR | Status: DC
  Administered 2022-02-08 – 2022-02-28 (×20): 40 mg via SUBCUTANEOUS
  Filled 2022-02-07 (×20): qty 0.4

## 2022-02-07 MED ORDER — ONDANSETRON HCL 4 MG PO TABS
4.0000 mg | ORAL_TABLET | Freq: Three times a day (TID) | ORAL | Status: DC | PRN
  Administered 2022-02-07 – 2022-02-08 (×2): 4 mg via ORAL
  Filled 2022-02-07 (×2): qty 1

## 2022-02-07 MED ORDER — BOOST / RESOURCE BREEZE PO LIQD CUSTOM
1.0000 | Freq: Three times a day (TID) | ORAL | Status: DC
  Administered 2022-02-07 – 2022-02-27 (×8): 1 via ORAL

## 2022-02-07 NOTE — Evaluation (Signed)
Physical Therapy Assessment and Plan  Patient Details  Name: Janet Hensley MRN: [id number] Date of Birth: 24-Feb-1970  PT Diagnosis: Difficulty walking Rehab Potential: Good ELOS: 2-3 Weeks   Today's Date: 02/07/2022 PT Individual Time: 1100-1200 PT Individual Time Calculation (min): 60 min    Hospital Problem: Principal Problem:   Critical illness myopathy   Past Medical History:  Past Medical History:  Diagnosis Date   Asthma 01/15/2012   Past Surgical History:  Past Surgical History:  Procedure Laterality Date   LEFT HEART CATH AND CORONARY ANGIOGRAPHY N/A 12/24/2021   Procedure: LEFT HEART CATH AND CORONARY ANGIOGRAPHY;  Surgeon: Yvonne Kendall, MD;  Location: [facility] INVASIVE CV LAB;  Service: Cardiovascular;  Laterality: N/A;    Assessment & Plan Clinical Impression: Patient is a 52 year old right-handed female with history of former tobacco use as well as polysubstance abuse.  Per chart review patient lives with her husband and brother.  Independent prior to admission.  1 level home 4 steps to entry.   Presented 12/22/2021 to [facility] with increasing shortness of breath and vomiting hypoxic 89% requiring intubation as well as mechanical ventilation.  Chest x-ray showed complete whiteout of left lung.  Bronchoscopy showed purulent secretions on the left which were suctioned out.  Blood culture showed Rothia Muciliaginosa, urine tested positive for Legionella, bronchoscopy positive for Pseudomonas and stenotrophomonas.  ID consulted placed on broad-spectrum antibiotics.  Hospital course complicated by AKI and bicarbonate drip was started.  Patient was transferred to [facility] 12/2 for ARDS management and possible need for ECMO.  Patient did receive ECMO.  Unable to wean off vent with tracheostomy placed 12/8.  She was transferred to [facility] 12/13 for ongoing ventilation care.  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  Patient has been  decannulated and diet advanced dysphagia #2 thin liquids.  She has been weaned from oxygen.  AKI resolved latest creatinine 0.42.  She did have bouts of hypotension placed on midodrine.  Maintained on subcutaneous heparin for DVT prophylaxis.  Patient transferred to CIR on 02/06/2022 .   Patient currently requires max with mobility secondary to muscle weakness, decreased cardiorespiratoy endurance, and decreased sitting balance, decreased standing balance, decreased postural control, and decreased balance strategies.  Prior to hospitalization, patient was independent  with mobility and lived with Spouse, Other (Comment) (Brother lives there and is available) in a Mobile home home.  Home access is 4Stairs to enter.  Patient will benefit from skilled PT intervention to maximize safe functional mobility, minimize fall risk, and decrease caregiver burden for planned discharge home with intermittent assist.  Anticipate patient will benefit from follow up [facility] at discharge.  PT - End of Session Activity Tolerance: Tolerates 10 - 20 min activity with multiple rests Endurance Deficit: Yes Endurance Deficit Description: rest breaks needed with all mobility tasks PT Assessment Rehab Potential (ACUTE/IP ONLY): Good PT Patient demonstrates impairments in the following area(s): Balance;Endurance;Motor;Pain;Safety PT Transfers Functional Problem(s): Bed Mobility;Bed to Chair;Car;Furniture PT Locomotion Functional Problem(s): Ambulation;Stairs PT Plan PT Intensity: Minimum of 1-2 x/day ,45 to 90 minutes PT Frequency: 5 out of 7 days PT Duration Estimated Length of Stay: 2-3 Weeks PT Treatment/Interventions: Ambulation/gait training;Community reintegration;DME/adaptive equipment instruction;Neuromuscular re-education;Psychosocial support;Stair training;UE/LE Strength taining/ROM;Wheelchair propulsion/positioning;Balance/vestibular training;Discharge planning;Functional electrical stimulation;Pain management;Skin  care/wound management;UE/LE Coordination activities;Therapeutic Activities;Cognitive remediation/compensation;Disease management/prevention;Functional mobility training;Patient/family education;Splinting/orthotics;Therapeutic Exercise;Visual/perceptual remediation/compensation PT Transfers Anticipated Outcome(s): Supervision PT Locomotion Anticipated Outcome(s): Supervision PT Recommendation Recommendations for Other Services: Therapeutic Recreation consult Therapeutic Recreation Interventions: Pet therapy Follow  Up Recommendations: Home health PT Patient destination: Home Equipment Recommended: To be determined   PT Evaluation Precautions/Restrictions Precautions Precautions: Fall Restrictions Weight Bearing Restrictions: No General Chart Reviewed: Yes Family/Caregiver Present: No  Pain Pain Assessment Pain Scale: 0-10 Pain Score: 9  Pain Type: Chronic pain Pain Location: Back Pain Orientation: Lower Pain Radiating Towards: ble buttock Pain Frequency: Constant Pain Onset: Gradual Pain Intervention(s): Medication (See eMAR) Pain Interference Pain Interference Pain Effect on Sleep: 4. Almost constantly Pain Interference with Therapy Activities: 2. Occasionally Pain Interference with Day-to-Day Activities: 2. Occasionally Home Living/Prior Functioning Home Living Available Help at Discharge: Family;Available PRN/intermittently Type of Home: Mobile home Home Access: Stairs to enter Entrance Stairs-Number of Steps: 4 Entrance Stairs-Rails: Left Home Layout: One level Bathroom Shower/Tub: Chiropodist: Standard Bathroom Accessibility: Yes  Lives With: Spouse;Other (Comment) (Brother lives there and is available) Prior Function Level of Independence: Independent with basic ADLs;Independent with homemaking with ambulation  Able to Take Stairs?: Yes Driving: Yes Vision/Perception  Vision - History Ability to See in Adequate Light: 0  Adequate Perception Perception: Within Functional Limits Praxis Praxis: Intact  Cognition Overall Cognitive Status: Within Functional Limits for tasks assessed Arousal/Alertness: Awake/alert Memory: Impaired Awareness: Appears intact Safety/Judgment: Appears intact Sensation Coordination Gross Motor Movements are Fluid and Coordinated: No Fine Motor Movements are Fluid and Coordinated: Yes Motor  Motor Motor - Skilled Clinical Observations: generalized global weakness  Trunk/Postural Assessment  Cervical Assessment Cervical Assessment:  (forward head) Thoracic Assessment Thoracic Assessment:  (rounded shoulders) Lumbar Assessment Lumbar Assessment:  (posterior pelvic tilt) Postural Control Postural Control: Deficits on evaluation Righting Reactions: delayed and inadequate  Balance Static Sitting Balance Static Sitting - Balance Support: Feet supported Static Sitting - Level of Assistance: 4: Min assist Dynamic Sitting Balance Dynamic Sitting - Balance Support: Feet supported Dynamic Sitting - Level of Assistance: 4: Min Insurance risk surveyor Standing - Balance Support: During functional activity Static Standing - Level of Assistance: 2: Max assist Extremity Assessment  RUE Assessment RUE Assessment: Exceptions to [facility] General Strength Comments: 3/5- shoulder FF 80 degrees LUE Assessment LUE Assessment: Exceptions to [facility] General Strength Comments: 3/5- shoulder FF 80 degrees RLE Assessment RLE Assessment: Exceptions to [facility] General Strength Comments: Grossly 3/5 LLE Assessment LLE Assessment: Exceptions to [facility] General Strength Comments: Grossly 3/5  Care Tool Care Tool Bed Mobility Roll left and right activity   Roll left and right assist level: Moderate Assistance - Patient 50 - 74%    Sit to lying activity   Sit to lying assist level: Maximal Assistance - Patient 25 - 49%    Lying to sitting on side of bed activity   Lying to sitting on side  of bed assist level: the ability to move from lying on the back to sitting on the side of the bed with no back support.: Maximal Assistance - Patient 25 - 49%     Care Tool Transfers Sit to stand transfer   Sit to stand assist level: Maximal Assistance - Patient 25 - 49%    Chair/bed transfer   Chair/bed transfer assist level: Maximal Assistance - Patient 25 - 49%     Toilet transfer   Assist Level: Maximal Assistance - Patient 24 - 49%    Car transfer Car transfer activity did not occur: Safety/medical concerns        Care Tool Locomotion Ambulation   Assist level: Maximal Assistance - Patient 25 - 49%   Max distance: 2'  Walk 10 feet  activity Walk 10 feet activity did not occur: Safety/medical concerns       Walk 50 feet with 2 turns activity Walk 50 feet with 2 turns activity did not occur: Safety/medical concerns      Walk 150 feet activity Walk 150 feet activity did not occur: Safety/medical concerns      Walk 10 feet on uneven surfaces activity Walk 10 feet on uneven surfaces activity did not occur: Safety/medical concerns      Stairs Stair activity did not occur: Safety/medical concerns        Walk up/down 1 step activity Walk up/down 1 step or curb (drop down) activity did not occur: Safety/medical concerns      Walk up/down 4 steps activity Walk up/down 4 steps activity did not occur: Safety/medical concerns      Walk up/down 12 steps activity Walk up/down 12 steps activity did not occur: Safety/medical concerns      Pick up small objects from floor Pick up small object from the floor (from standing position) activity did not occur: Safety/medical concerns      Wheelchair Is the patient using a wheelchair?: Yes Type of Wheelchair: Manual   Wheelchair assist level: Supervision/Verbal cueing Max wheelchair distance: 150'  Wheel 50 feet with 2 turns activity   Assist Level: Supervision/Verbal cueing  Wheel 150 feet activity   Assist Level:  Supervision/Verbal cueing    Refer to Care Plan for Long Term Goals  SHORT TERM GOAL WEEK 1 PT Short Term Goal 1 (Week 1): Pt will complete bed mobility with minA consistently. PT Short Term Goal 2 (Week 1): Pt will complete bed to chair transfer consistently PT Short Term Goal 3 (Week 1): Pt will ambulate x15' with modA and LRAD.  Recommendations for other services: Therapeutic Recreation  Pet therapy  Skilled Therapeutic Intervention  Evaluation completed (see details above and below) with education on PT POC and goals and individual treatment initiated with focus on bed mobility, balance, transfers, and activity tolerance. Pt received supine in bed asleep. Awakens to verbal and tactile stimuli and agrees to therapy. Reports chronic pain in low back. Number not provided. PT provides repositioning and rest breaks ot manage pain. And RN arrives to provide pain medication. Pt performs supine to sit with maxA and cues for logrolling and sequencing, as well as positioning at edge of bed to prepare for transfer. Pt verbalizes need to urinate. Drop arm bed side commode positioned next to bed and pt performs squat pivot transfer to the R to [facility] with maxA and specific emphasis on anterior weight shift, initiation, and sequencing. Once seated on BSC, pt performs sit to stand transfer with maxA and same cues. In standing, PT supports pt and promotes hip and trunk extension and RN assists to pull down pants and brief. Pt is continent of bladder. Pt then performs stand pivot back to bed with maxA. Pt fatigued and requires extended seated rest break. Pt then performs squat pivot to WC with maxA and cues for hand placement and sequencing. WC transport to gym. PT provides pt with smaller WC for improved fit. Pt performs sit to stand with PT providing maxA at hips and trunk and facilitating upright posture and anterior weight shift. Pt able to take side steps ~2' for transfer. Following rest break, pt stands to [place]  with modA and cues for body mechanics and initiation. Pt positioned in high perch to engage core and postural muscles. MinA/modA to stand from high perched position. Pt completes x2  prior to sitting back in WC. Pt self propels x150' with bilateral upper extremities and cues to extend shoulders to improve power. Squat pivot back to bed with maxA. Sit to supine with bed features and minA management of bilateral lower extremities. Pt left semi reclined with alarm intact and all needs within reach.  Mobility Bed Mobility Bed Mobility: Rolling Right;Rolling Left;Sit to Supine;Supine to Sit Rolling Right: Maximal Assistance - Patient 25-49% Rolling Left: Maximal Assistance - Patient 25-49% Supine to Sit: Maximal Assistance - Patient - Patient 25-49% Sit to Supine: Maximal Assistance - Patient 25-49% Transfers Transfers: Stand to Sit;Sit to Stand;Stand Pivot Transfers;Squat Pivot Transfers Sit to Stand: Maximal Assistance - Patient 25-49% Stand to Sit: Maximal Assistance - Patient 25-49% Stand Pivot Transfers: Maximal Assistance - Patient 25 - 49% Stand Pivot Transfer Details: Manual facilitation for weight shifting;Verbal cues for sequencing;Tactile cues for posture;Tactile cues for sequencing;Verbal cues for technique Squat Pivot Transfers: Maximal Assistance - Patient 25-49% Transfer (Assistive device): 1 person hand held assist Locomotion  Gait Ambulation: Yes Gait Assistance: Maximal Assistance - Patient 25-49% Gait Distance (Feet): 2 Feet Assistive device: 1 person hand held assist Gait Assistance Details: Tactile cues for initiation;Verbal cues for technique;Manual facilitation for placement;Manual facilitation for weight shifting Gait Gait: Yes Gait Pattern: Impaired Gait Pattern:  (only able to take several side steps during trasnfer) High Level Ambulation High Level Ambulation: Side stepping Side Stepping: during transfer Stairs / Additional Locomotion Stairs: No Wheelchair  Mobility Wheelchair Mobility: Yes Wheelchair Assistance: Chartered loss adjuster: Both upper extremities Wheelchair Parts Management: Needs assistance Distance: 150'   Discharge Criteria: Patient will be discharged from PT if patient refuses treatment 3 consecutive times without medical reason, if treatment goals not met, if there is a change in medical status, if patient makes no progress towards goals or if patient is discharged from hospital.  The above assessment, treatment plan, treatment alternatives and goals were discussed and mutually agreed upon: by patient  Breck Coons, PT, DPT 02/07/2022, 12:39 PM

## 2022-02-07 NOTE — Evaluation (Signed)
Occupational Therapy Assessment and Plan  Patient Details  Name: Janet Hensley MRN: [id number] Date of Birth: 1970-03-19  OT Diagnosis: lumbago (low back pain), muscle weakness (generalized), and deconditioned Rehab Potential: Rehab Potential (ACUTE ONLY): Excellent ELOS: 2.5-3 weeks   Today's Date: 02/07/2022 OT Individual Time: 0905-1005 OT Individual Time Calculation (min): 60 min     Hospital Problem: Principal Problem:   Critical illness myopathy   Past Medical History:  Past Medical History:  Diagnosis Date   Asthma 01/15/2012   Past Surgical History:  Past Surgical History:  Procedure Laterality Date   LEFT HEART CATH AND CORONARY ANGIOGRAPHY N/A 12/24/2021   Procedure: LEFT HEART CATH AND CORONARY ANGIOGRAPHY;  Surgeon: Nelva Bush, MD;  Location: [facility];  Service: Cardiovascular;  Laterality: N/A;    Assessment & Plan Clinical Impression: Patient is a 52 y.o. year old female with recent admission to the hospital on11/26/2023 to [facility] with increasing shortness of breath and vomiting hypoxic 89% requiring intubation as well as mechanical ventilation.  Chest x-ray showed complete whiteout of left lung.  Bronchoscopy showed purulent secretions on the left which were suctioned out.  Blood culture showed Rothia Muciliaginosa, urine tested positive for Legionella, bronchoscopy positive for Pseudomonas and stenotrophomonas.  ID consulted placed on broad-spectrum antibiotics.  Hospital course complicated by AKI and bicarbonate drip was started.  Patient was transferred to [facility] 12/2 for ARDS management and possible need for ECMO.  Patient did receive ECMO.  Unable to wean off vent with tracheostomy placed 12/8.  She was transferred to [facility] 12/13 for ongoing ventilation care.  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  Patient has been decannulated and diet advanced dysphagia #2 thin liquids.  She has been  weaned from oxygen.  AKI resolved latest creatinine 0.42.  She did have bouts of hypotension placed on midodrine.  Maintained on subcutaneous heparin for DVT prophylaxis.  Therapy evaluations ongoing patient with profound debility and was admitted for a comprehensive rehab program.Patient transferred to CIR on 02/06/2022 .    Patient currently requires max with basic self-care skills secondary to muscle weakness, decreased cardiorespiratoy endurance, and decreased sitting balance, decreased standing balance, decreased postural control, and decreased balance strategies.  Prior to hospitalization, patient could complete BADL with independent .  Patient will benefit from skilled intervention to increase independence with basic self-care skills prior to discharge home with care partner.  Anticipate patient will require 24 hour supervision and follow up home health.  OT - End of Session Endurance Deficit: Yes Endurance Deficit Description: rest breaks needed with all mobility tasks OT Assessment Rehab Potential (ACUTE ONLY): Excellent OT Patient demonstrates impairments in the following area(s): Balance;Endurance;Motor;Pain;Skin Integrity OT Basic ADL's Functional Problem(s): Eating;Grooming;Dressing;Toileting;Bathing OT Transfers Functional Problem(s): Toilet;Tub/Shower OT Additional Impairment(s): Fuctional Use of Upper Extremity OT Plan OT Intensity: Minimum of 1-2 x/day, 45 to 90 minutes OT Frequency: 5 out of 7 days OT Duration/Estimated Length of Stay: 2.5-3 weeks OT Treatment/Interventions: Balance/vestibular training;Cognitive remediation/compensation;Community reintegration;Discharge planning;Disease mangement/prevention;DME/adaptive equipment instruction;Functional electrical stimulation;Functional mobility training;Neuromuscular re-education;Pain management;Patient/family education;Psychosocial support;Self Care/advanced ADL retraining;Skin care/wound managment;Splinting/orthotics;Therapeutic  Activities;Therapeutic Exercise;UE/LE Strength taining/ROM;UE/LE Coordination activities;Visual/perceptual remediation/compensation;Wheelchair propulsion/positioning OT Self Feeding Anticipated Outcome(s): Mod I OT Basic Self-Care Anticipated Outcome(s): Supervision OT Toileting Anticipated Outcome(s): Supervision OT Bathroom Transfers Anticipated Outcome(s): Supervision OT Recommendation Recommendations for Other Services: Therapeutic Recreation consult Therapeutic Recreation Interventions: Pet therapy;Stress management;Outing/community reintergration Patient destination: Home Follow Up Recommendations: Home health OT;24 hour supervision/assistance (vs OPOT) Equipment Recommended: Tub/shower bench;3  in 1 bedside comode Equipment Details: will likely need a tub bench and BSC   OT Evaluation Precautions/Restrictions  Precautions Precautions: Fall Restrictions Weight Bearing Restrictions: No Pain Pain Assessment Pain Scale: 0-10 Pain Score: 9  Pain Type: Chronic pain Pain Location: Back Pain Orientation: Lower Pain Radiating Towards: ble buttock Pain Frequency: Constant Pain Onset: Gradual Pain Intervention(s): Repositioned  Home Living/Prior Functioning Home Living Family/patient expects to be discharged to:: Private residence Living Arrangements: Spouse/significant other Available Help at Discharge: Family, Available PRN/intermittently Type of Home: Mobile home Home Access: Stairs to enter Technical brewer of Steps: 4 Entrance Stairs-Rails: Left Home Layout: One level Bathroom Shower/Tub: Chiropodist: Standard Bathroom Accessibility: Yes  Lives With: Spouse, Other (Comment) (Brother lives there and is available) IADL History IADL Comments: used to work as a Chartered certified accountant, was Financial controller and doing [employer]. Enjoys spending time with 3 dogs Prior Function Level of Independence: Independent with basic ADLs, Independent with homemaking with  ambulation  Able to Take Stairs?: Yes Driving: Yes Vision Baseline Vision/History: 0 No visual deficits Ability to See in Adequate Light: 0 Adequate Patient Visual Report: No change from baseline Perception  Perception: Within Functional Limits Praxis Praxis: Intact Cognition Cognition Overall Cognitive Status: Impaired/Different from baseline Arousal/Alertness: Awake/alert Orientation Level: Person;Place;Situation Person: Oriented Place: Oriented Situation: Oriented Memory: Impaired Memory Impairment: Decreased recall of new information;Decreased short term memory Awareness: Appears intact Problem Solving: Impaired Problem Solving Impairment: Functional complex Safety/Judgment: Appears intact Brief Interview for Mental Status (BIMS) Repetition of Three Words (First Attempt): 3 Temporal Orientation: Year: Correct Temporal Orientation: Month: Accurate within 5 days Temporal Orientation: Day: Incorrect Recall: "Sock": Yes, no cue required Recall: "Blue": Yes, after cueing ("a color") Recall: "Bed": Yes, after cueing ("a piece of furniture") BIMS Summary Score: 12 Sensation Coordination Gross Motor Movements are Fluid and Coordinated: No Fine Motor Movements are Fluid and Coordinated: Yes Motor  Motor Motor - Skilled Clinical Observations: generalized global weakness  Balance Static Sitting Balance Static Sitting - Balance Support: Feet supported Static Sitting - Level of Assistance: 4: Min assist Dynamic Sitting Balance Dynamic Sitting - Balance Support: Feet supported Dynamic Sitting - Level of Assistance: 4: Min assist Static Standing Balance Static Standing - Balance Support: During functional activity Static Standing - Level of Assistance: 2: Max assist Extremity/Trunk Assessment RUE Assessment RUE Assessment: Exceptions to [facility] General Strength Comments: 3/5- shoulder FF 80 degrees LUE Assessment LUE Assessment: Exceptions to [facility] General Strength Comments:  3/5- shoulder FF 80 degrees  Care Tool Care Tool Self Care Eating   Eating Assist Level: Minimal Assistance - Patient > 75%    Oral Care    Oral Care Assist Level: Minimal Assistance - Patient > 75%    Bathing   Body parts bathed by patient: Chest;Abdomen;Right upper leg;Left upper leg;Face Body parts bathed by helper: Left lower leg;Right lower leg;Front perineal area;Buttocks;Right arm;Left arm   Assist Level: Maximal Assistance - Patient 24 - 49%    Upper Body Dressing(including orthotics)   What is the patient wearing?: Pull over shirt   Assist Level: Moderate Assistance - Patient 50 - 74%    Lower Body Dressing (excluding footwear)   What is the patient wearing?: Pants Assist for lower body dressing: Maximal Assistance - Patient 25 - 49%    Putting on/Taking off footwear   What is the patient wearing?: Non-skid slipper socks Assist for footwear: Maximal Assistance - Patient 25 - 49%       Care Tool Toileting Toileting  activity   Assist for toileting: Maximal Assistance - Patient 25 - 49%     Care Tool Bed Mobility Roll left and right activity   Roll left and right assist level: Moderate Assistance - Patient 50 - 74%    Sit to lying activity   Sit to lying assist level: Maximal Assistance - Patient 25 - 49%    Lying to sitting on side of bed activity   Lying to sitting on side of bed assist level: the ability to move from lying on the back to sitting on the side of the bed with no back support.: Maximal Assistance - Patient 25 - 49%     Care Tool Transfers Sit to stand transfer   Sit to stand assist level: Maximal Assistance - Patient 25 - 49%    Chair/bed transfer   Chair/bed transfer assist level: Maximal Assistance - Patient 25 - 49%     Toilet transfer   Assist Level: Maximal Assistance - Patient 24 - 49%     Care Tool Cognition  Expression of Ideas and Wants Expression of Ideas and Wants: 3. Some difficulty - exhibits some difficulty with expressing  needs and ideas (e.g, some words or finishing thoughts) or speech is not clear  Understanding Verbal and Non-Verbal Content Understanding Verbal and Non-Verbal Content: 4. Understands (complex and basic) - clear comprehension without cues or repetitions   Memory/Recall Ability Memory/Recall Ability : Current season;That he or she is in a hospital/hospital unit   Refer to Care Plan for Long Term Goals  SHORT TERM GOAL WEEK 1 OT Short Term Goal 1 (Week 1): Patient will complete toilet transfer with mod A  of 1 OT Short Term Goal 2 (Week 1): Patient will complete 1 step of toileting task OT Short Term Goal 3 (Week 1): Patient will stand at the sink with mod A of 1 in preparation for BADL Task.  Recommendations for other services: Therapeutic Recreation  Pet therapy, Stress management, and Outing/community reintegration   Skilled Therapeutic Intervention Pt greeted semi-reclined in bed asleep, easy to wake and agreeable to OT eval and treat. OT eval completed addressing rehab process, OT purpose, POC, ELOS, and goals. Pt completed bed mobility with max A to elevate trunk into sitting. Pt tolerated sitting EOB for bathing/dressing tasks but was very fatigued with just sitting. Pt with extensive global weakness and deconditioned. Pt unable to lift UE's past 80 degrees against gravity and difficulty even reaching across body to wash under arms in sitting. Attempted sit<>stands at EOB to pull up pants with max A to achieve partial stand. Pt with good initial power up, but unable to elicit full hip and trunk extension. Pt returned to supine with mod A and rolling in bed max A to pull pants up over hips. Pt left semi-reclined in bed with bed alarm on, call bell in reach, and needs met. See details below for further details regarding BADL performance.   ADL ADL Eating: Minimal assistance Grooming: Moderate assistance Upper Body Bathing: Moderate assistance Lower Body Bathing: Maximal assistance Upper  Body Dressing: Moderate assistance Lower Body Dressing: Maximal assistance Toileting: Maximal assistance Toilet Transfer: Maximal assistance Mobility  Bed Mobility Bed Mobility: Rolling Right;Rolling Left;Sit to Supine;Supine to Sit Rolling Right: Maximal Assistance - Patient 25-49% Rolling Left: Maximal Assistance - Patient 25-49% Supine to Sit: Maximal Assistance - Patient - Patient 25-49% Sit to Supine: Maximal Assistance - Patient 25-49% Transfers Sit to Stand: Maximal Assistance - Patient 25-49% Stand to Sit: Maximal Assistance -  Patient 25-49%   Discharge Criteria: Patient will be discharged from OT if patient refuses treatment 3 consecutive times without medical reason, if treatment goals not met, if there is a change in medical status, if patient makes no progress towards goals or if patient is discharged from hospital.  The above assessment, treatment plan, treatment alternatives and goals were discussed and mutually agreed upon: by patient  Mal Amabile 02/07/2022, 3:38 PM

## 2022-02-07 NOTE — Progress Notes (Signed)
Inpatient Rehabilitation  Patient information reviewed and entered into eRehab system by Sathvik Tiedt Raza Bayless, OTR/L, Rehab Quality Coordinator.   Information including medical coding, functional ability and quality indicators will be reviewed and updated through discharge.   

## 2022-02-07 NOTE — Plan of Care (Signed)
  Problem: RH Balance Goal: LTG Patient will maintain dynamic standing with ADLs (OT) Description: LTG:  Patient will maintain dynamic standing balance with assist during activities of daily living (OT)  Flowsheets (Taken 02/07/2022 1215) LTG: Pt will maintain dynamic standing balance during ADLs with: Supervision/Verbal cueing   Problem: Sit to Stand Goal: LTG:  Patient will perform sit to stand in prep for activites of daily living with assistance level (OT) Description: LTG:  Patient will perform sit to stand in prep for activites of daily living with assistance level (OT) Flowsheets (Taken 02/07/2022 1215) LTG: PT will perform sit to stand in prep for activites of daily living with assistance level: Supervision/Verbal cueing   Problem: RH Eating Goal: LTG Patient will perform eating w/assist, cues/equip (OT) Description: LTG: Patient will perform eating with assist, with/without cues using equipment (OT) Flowsheets (Taken 02/07/2022 1215) LTG: Pt will perform eating with assistance level of: Independent   Problem: RH Grooming Goal: LTG Patient will perform grooming w/assist,cues/equip (OT) Description: LTG: Patient will perform grooming with assist, with/without cues using equipment (OT) Flowsheets (Taken 02/07/2022 1215) LTG: Pt will perform grooming with assistance level of: Independent   Problem: RH Bathing Goal: LTG Patient will bathe all body parts with assist levels (OT) Description: LTG: Patient will bathe all body parts with assist levels (OT) Flowsheets (Taken 02/07/2022 1215) LTG: Pt will perform bathing with assistance level/cueing: Supervision/Verbal cueing   Problem: RH Dressing Goal: LTG Patient will perform upper body dressing (OT) Description: LTG Patient will perform upper body dressing with assist, with/without cues (OT). Flowsheets (Taken 02/07/2022 1215) LTG: Pt will perform upper body dressing with assistance level of: Supervision/Verbal cueing Goal: LTG Patient  will perform lower body dressing w/assist (OT) Description: LTG: Patient will perform lower body dressing with assist, with/without cues in positioning using equipment (OT) Flowsheets (Taken 02/07/2022 1215) LTG: Pt will perform lower body dressing with assistance level of: Supervision/Verbal cueing   Problem: RH Toileting Goal: LTG Patient will perform toileting task (3/3 steps) with assistance level (OT) Description: LTG: Patient will perform toileting task (3/3 steps) with assistance level (OT)  Flowsheets (Taken 02/07/2022 1215) LTG: Pt will perform toileting task (3/3 steps) with assistance level: Supervision/Verbal cueing   Problem: RH Functional Use of Upper Extremity Goal: LTG Patient will use RT/LT upper extremity as a (OT) Description: LTG: Patient will use right/left upper extremity as a stabilizer/gross assist/diminished/nondominant/dominant level with assist, with/without cues during functional activity (OT) Flowsheets (Taken 02/07/2022 1215) LTG: Use of upper extremity in functional activities:  RUE as dominant level  LUE as nondominant level   Problem: RH Toilet Transfers Goal: LTG Patient will perform toilet transfers w/assist (OT) Description: LTG: Patient will perform toilet transfers with assist, with/without cues using equipment (OT) Flowsheets (Taken 02/07/2022 1215) LTG: Pt will perform toilet transfers with assistance level of: Supervision/Verbal cueing   Problem: RH Tub/Shower Transfers Goal: LTG Patient will perform tub/shower transfers w/assist (OT) Description: LTG: Patient will perform tub/shower transfers with assist, with/without cues using equipment (OT) Flowsheets (Taken 02/07/2022 1215) LTG: Pt will perform tub/shower stall transfers with assistance level of: Supervision/Verbal cueing

## 2022-02-07 NOTE — Progress Notes (Addendum)
Courtney Heys, MD  Physician Physical Medicine and Rehabilitation  PMR Admission Coordinator Pre-Admission Assessment   Patient: Janet Hensley is an 52 y.o., female MRN: [id number] DOB: 30-Aug-1970 Height: 5'3" Weight: 83.3 kg   Insurance Information HMO:  yes   PPO:      PCP:      IPA:      80/20:      OTHER:  PRIMARY: Comptroller       Policy#: [id number]      Subscriber: Pt CM Name: Cassie      Phone#: [phone]   Fax#: [phone] Pre-Cert#: [id number] from 02/06/22 to 02/11/22 with update due to fax [phone] on 02/11/22      Employer:  Benefits:  Phone #: [phone]     Name:  Irene Shipper Date: 01/27/2022 - still active Deductible: $7,500 ($0 met) OOP Max: $9,400 ($0 met) CIR: 50% coverage; 50% co-insurance SNF: 50% coverage; 50% co-insurance; limited to 90 days/cal yr (90 remaining) Outpatient:  $50 copay/visit Home Health:  $50 copay/visit DME: 50% coverage; 50% co-insurance Providers: in network   SECONDARY: Varney Ba[employer] Medicaid [place]       Policy#: [id number]    Phone#: [phone] Pre-cert # : [id number] with auth from 02/06/22 to 02/12/22 with update due on 02/12/22 to fax [phone]   Financial Counselor:       Phone#:    The "Data Collection Information Summary" for patients in Inpatient Rehabilitation Facilities with attached "Privacy Act [place] Records" was provided and verbally reviewed with: N/A   Emergency Contact Information Contact Information       Name Relation Home Work Mobile    Toscano,Chris Spouse     [phone]           Current Medical History  Patient Admitting Diagnosis: Respiratory Failure, Debility, Bactremia    History of Present Illness: Pt. Is a 52 year old smoker presented to [facility] 12/22/21 with cough, shortness of breath nausea and vomiting, hypoxic to 89%, requiring.  intubation for mechanical ventilation. Chest x-ray at the time showed complete whiteout of left lung.  Bronchoscopy showed purulent  secretions on the left which were suctioned out. Blood cultures showed Rothia Mucilaginosa, urine tested positive for Legionella, bronchoscopy was positive for Pseudomonas and stenotrophomonas . ID consult was obtained she was treated with cefepime and levofloxacin and nebulized tobramycin . Course was complicated by AKI and bicarbonate drip was started. She was transferred to [facility] on 12/28/21 for ARDS management and possible need for ECMO. Patient did receive ECMO. Unable to wean off vent and tracheostomy placed 01/03/22. Occasionally had soft BP and required low dose levo. Was started on midodrine for soft BP and off levo. Transferring to [facility] on 01/08/22. Pt. Completed Levaquin 01/09/22. Pt. Also with AKI due to septic acute tubular necrosis, but that resolved. She was was weaned from the vent and ultimately decannulated 01/31/22. IV merepenem to end 02/04/22. Pt with some dysphagia  and malnutrition and had coretrak placed during acute admission. This was discontinued while at [facility]. She is currently on a dysphagia 2 diet with thin liquids, per SLP recommendations. PT/OT/SLP worked with patient during stay at [facility] and they recommend CIR to assist with return to PLOF.    Patient's medical record from [facility]  has been reviewed by the rehabilitation admission coordinator and physician.   Past Medical History      Past Medical History:  Diagnosis Date   Asthma 01/15/2012      Has the patient  had major surgery during 100 days prior to admission? Yes   Family History   family history is not on file.   Current Medications No current facility-administered medications for this encounter. No current outpatient medications on file.   Patients Current Diet: Diet Dysphagia 2, Thin    Precautions / Restrictions Precautions:  Fall Weight Bearing Restrictions: No   Has the patient had 2 or more falls or a fall with injury in the past year? No   Prior  Activity Level Community (5-7x/wk): Pt. acitve in the community PTA     Prior Functional Level Self Care: Did the patient need help bathing, dressing, using the toilet or eating? Independent   Indoor Mobility: Did the patient need assistance with walking from room to room (with or without device)? Independent   Stairs: Did the patient need assistance with internal or external stairs (with or without device)? Independent   Functional Cognition: Did the patient need help planning regular tasks such as shopping or remembering to take medications? Independent   Patient Information Are you of Hispanic, Latino/a,or Spanish origin?: A. No, not of Hispanic, Latino/a, or Spanish origin What is your race?: A. White Do you need or want an interpreter to communicate with a doctor or health care staff?: 0. No  Patient's Response To:  Health Literacy and Transportation Is the patient able to respond to health literacy and transportation needs?: Yes Health Literacy - How often do you need to have someone help you when you read instructions, pamphlets, or other written material from your doctor or pharmacy?: Sometimes In the past 12 months, has lack of transportation kept you from medical appointments or from getting medications?: No In the past 12 months, has lack of transportation kept you from meetings, work, or from getting things needed for daily living?: No  Home Assistive Devices / Equipment None   Prior Device Use: Indicate devices/aids used by the patient prior to current illness, exacerbation or injury? None of the above     Prior Functional Level Current Functional Level  Bed Mobility   Independent Mod assist    Transfers   Independent   Max assist    Mobility - Walk/Wheelchair   Independent   Total assist    Upper Body Dressing   Independent   Other    Lower Body Dressing   Independent   Max assist    Grooming   Independent   Mod assist    Eating/Drinking    Independent   Min assist    Toilet Transfer   Independent   Mod Independent    Bladder Continence    Continent   continent    Bowel Management   Continent   continent    Stair Climbing   Independent   Independent    Communication   Independent   min A    Memory   Independent min A      Special Needs/ Care Considerations Skin stoma from trach, removed   Previous Home Environment (from acute therapy documentation)  Lives With: Spouse; Other (Comment) Type of Home: Mobile home Home Layout: One level Home Access: Stairs to enter Entrance Stairs-Rails: Left Entrance Stairs-Number of Steps: 4 Bathroom Shower/Tub: Armed forces operational officer Accessibility: Yes How Accessible: Accessible via wheelchair; Accessible via walker     Discharge Living Setting Plans for Discharge Living Setting: Patient's home Type of Home at Discharge: House Discharge Home Layout: One level Discharge Home Access: Stairs to enter Entrance Stairs-Rails: Left  Entrance Stairs-Number of Steps: 4 Discharge Bathroom Shower/Tub: Tub/shower unit Discharge Bathroom Toilet: Standard Discharge Bathroom Accessibility: Yes How Accessible: Accessible via walker; Accessible via wheelchair     Social/Family/Support Systems Patient Roles: Other (Comment) Contact Information: [phone] Anticipated Caregiver: Brother John Anticipated Caregiver's Contact Information: Husband works, brother in the home 24/7 Ability/Limitations of Caregiver: none Caregiver Availability: 24/7 Discharge Plan Discussed with Primary Caregiver: Yes Is Caregiver In Agreement with Plan?: Yes Does Caregiver/Family have Issues with Lodging/Transportation while Pt is in Rehab?: No     Goals Patient/Family Goal for Rehab: PT/OT Min A, SLP Mod I Expected length of stay: 18-21 days Pt/Family Agrees to Admission and willing to participate: Yes Program Orientation Provided & Reviewed with Pt/Caregiver  Including Roles  & Responsibilities: Yes     Decrease burden of Care through IP rehab admission: None    Possible need for SNF placement upon discharge: not anticipated   Patient Condition: I have reviewed medical records from [facility] , spoken with CM, and patient and family member. I met with patient at the bedside for inpatient rehabilitation assessment.  Patient will benefit from ongoing PT, OT, and SLP, can actively participate in 3 hours of therapy a day 5 days of the week, and can make measurable gains during the admission.  Patient will also benefit from the coordinated team approach during an Inpatient Acute Rehabilitation admission.  The patient will receive intensive therapy as well as Rehabilitation physician, nursing, social worker, and care management interventions.  Due to safety, skin/wound care, disease management, medication administration, pain management, and patient education the patient requires 24 hour a day rehabilitation nursing.  The patient is currently Max A with mobility and basic ADLs.  Discharge setting and therapy post discharge at home with home health is anticipated.  Patient has agreed to participate in the Acute Inpatient Rehabilitation Program and will admit today.   Preadmission Screen Completed By:  Courtney Heys, 02/06/2022 1:58 PM ______________________________________________________________________   Discussed status with Dr. Dagoberto Ligas on 02/06/22 at 1:00 pm and received approval for admission today.   Admission Coordinator:  Courtney Heys, MD, time 1:54 pm/Date 02/06/22    Assessment/Plan: Diagnosis: Does the need for close, 24 hr/day Medical supervision in concert with the patient's rehab needs make it unreasonable for this patient to be served in a less intensive setting? Yes Co-Morbidities requiring supervision/potential complications: Legionella, pseudomonas pneumonia; and AKI/ATN; received ECMO and was on vent- s/p trach; dysphagia; D2 thin  liquids; ICU myopathy Due to bladder management, bowel management, safety, skin/wound care, disease management, medication administration, pain management, and patient education, does the patient require 24 hr/day rehab nursing? Yes Does the patient require coordinated care of a physician, rehab nurse, PT, OT, and SLP to address physical and functional deficits in the context of the above medical diagnosis(es)? Yes Addressing deficits in the following areas: balance, endurance, locomotion, strength, transferring, bowel/bladder control, bathing, dressing, feeding, grooming, toileting, and swallowing Can the patient actively participate in an intensive therapy program of at least 3 hrs of therapy 5 days a week? Yes The potential for patient to make measurable gains while on inpatient rehab is good and fair Anticipated functional outcomes upon discharge from inpatient rehab: min assist PT, min assist OT, modified independent SLP Estimated rehab length of stay to reach the above functional goals is: 18-21 days Anticipated discharge destination: Home 10. Overall Rehab/Functional Prognosis: good and fair   MD Signature

## 2022-02-07 NOTE — Progress Notes (Signed)
   02/07/22 1415  Spiritual Encounters  Type of Visit Initial  Care provided to: Patient  Referral source Nurse (RN/NT/LPN)  Reason for visit Advance directives   Chaplain responded to a spiritual consult for advanced directive education. I went over the paperwork with the patient. Her plan is to go over it with her husband in the evening. I encouraged her to let her nurse know if she has any questions and a chaplain will return and go over the question she may have.   Danice Goltz [facility]  [phone]

## 2022-02-07 NOTE — Discharge Instructions (Addendum)
Inpatient Rehab Discharge Instructions  Elasia C Pong Discharge date and time: No discharge date for patient encounter.   Activities/Precautions/ Functional Status: Activity: activity as tolerated Diet: Soft Wound Care: Routine skin checks Functional status:  ___ No restrictions     ___ Walk up steps independently ___ 24/7 supervision/assistance   ___ Walk up steps with assistance ___ Intermittent supervision/assistance  ___ Bathe/dress independently ___ Walk with walker     _x__ Bathe/dress with assistance ___ Walk Independently    ___ Shower independently ___ Walk with assistance    ___ Shower with assistance ___ No alcohol     ___ Return to work/school ________  Special Instructions: No driving smoking alcohol or illicit drug use   COMMUNITY REFERRALS UPON DISCHARGE:    Outpatient: PT      OT     ST                                    [place] Location  Phone: [phone]            Address:  [address]                                                 [place]                                                 [place], [place] [address]             Appointment Date/Time:*Please expect follow-up within 7-10 business days to schedule your appointment. IF you have not received follow-up, be sure to contact the site directly.*  Medical Equipment/Items Ordered: rolling walker, 3in1 bedside commode, shower chair with back, wheelchair, and nebulizer with medicine ( duoneb)                                                 Agency/Supplier:[employer] [phone]  GENERAL COMMUNITY RESOURCES FOR PATIENT/FAMILY:    My questions have been answered and I understand these instructions. I will adhere to these goals and the provided educational materials after my discharge from the hospital.  Patient/Caregiver Signature _______________________________ Date __________  Clinician Signature _______________________________________ Date  __________  Please bring this form and your medication list with you to all your follow-up doctor's appointments.

## 2022-02-07 NOTE — Evaluation (Signed)
Speech Language Pathology Assessment and Plan  Patient Details  Name: Janet Hensley MRN: [id number] Date of Birth: 08-02-1970  SLP Diagnosis: Cognitive Impairments;Voice disorder;Dysphagia  Rehab Potential: Excellent ELOS: 2.5-3 weeks    Today's Date: 02/07/2022 SLP Individual Time: 6834-1962 SLP Individual Time Calculation (min): 54 min   Hospital Problem: Principal Problem:   Critical illness myopathy  Past Medical History:  Past Medical History:  Diagnosis Date   Asthma 01/15/2012   Past Surgical History:  Past Surgical History:  Procedure Laterality Date   LEFT HEART CATH AND CORONARY ANGIOGRAPHY N/A 12/24/2021   Procedure: LEFT HEART CATH AND CORONARY ANGIOGRAPHY;  Surgeon: Nelva Bush, MD;  Location: [facility];  Service: Cardiovascular;  Laterality: N/A;    Assessment / Plan / Recommendation Clinical Impression Patient is a 52 year old right-handed female with history of former tobacco use as well as polysubstance abuse.   Presented 12/22/2021 to [facility] with increasing shortness of breath and vomiting hypoxic 89% requiring intubation as well as mechanical ventilation.  Chest x-ray showed complete whiteout of left lung.  Bronchoscopy showed purulent secretions on the left which were suctioned out.  Blood culture showed Rothia Muciliaginosa, urine tested positive for Legionella, bronchoscopy positive for Pseudomonas and stenotrophomonas.  ID consulted placed on broad-spectrum antibiotics.  Hospital course complicated by AKI and bicarbonate drip was started. Patient was transferred to [facility] 12/2 for ARDS management and possible need for ECMO.  Patient did receive ECMO.  Unable to wean off vent with tracheostomy placed 12/8.  She was transferred to [facility] 12/13 for ongoing ventilation care.  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  Patient has been decannulated and diet advanced. She has been weaned from oxygen.   She did have bouts of hypotension placed on midodrine.  Maintained on subcutaneous heparin for DVT prophylaxis.  Therapy evaluations ongoing patient with profound debility and was admitted for a comprehensive rehab program 02/06/22.  Upon arrival, patient was asleep but easily awakened. Patient was repositioned in bed to maximize safety with overall PO intake. Patient observed with breakfast meal of Dys. 3 textures with thin liquids via straw. Patient demonstrated mildly prolonged mastication with solid textures but without overt s/s of aspiration. Patient with one coughing episode with thin via straw, suspect due to large bolus. No further overt s/s of aspiration observed throughout meal. Recommend patient continue current diet.   Patient recently decannulated with her stoma not fully closed resulting in intermittent air escaping during verbal expression with cues needed to apply pressure to dressing. Patient also with a hoarse vocal quality but remained 100% intelligible.  A formal cognitive evaluation was not administered due to time constraints, however, patient demonstrated mild deficits in problem solving and short-term recall of functional information.  Patient would benefit from skilled SLP intervention to maximize her cognitive and swallowing function prior to discharge.    Skilled Therapeutic Interventions          Administered a BSE and cognitive-linguistic evaluation, please see above for details.   SLP Assessment  Patient will need skilled Speech Lanaguage Pathology Services during CIR admission    Recommendations  SLP Diet Recommendations: Dysphagia 3 (Mech soft);Thin Liquid Administration via: Straw Medication Administration: Crushed with puree Supervision: Patient able to self feed;Intermittent supervision to cue for compensatory strategies Compensations: Minimize environmental distractions;Slow rate;Small sips/bites Postural Changes and/or Swallow Maneuvers: Seated upright 90  degrees Oral Care Recommendations: Oral care BID Patient destination: Home Follow up Recommendations: 24 hour supervision/assistance;Home Health  SLP;Outpatient SLP Equipment Recommended: None recommended by SLP    SLP Frequency 3 to 5 out of 7 days   SLP Duration  SLP Intensity  SLP Treatment/Interventions 2.5-3 weeks  Minumum of 1-2 x/day, 30 to 90 minutes  Cognitive remediation/compensation;Dysphagia/aspiration precaution training;Internal/external aids;Speech/Language facilitation;Therapeutic Activities;Environmental controls;Cueing hierarchy;Functional tasks;Patient/family education    Pain No/Denies Pain  Prior Functioning Type of Home: Mobile home  Lives With: Spouse;Other (Comment) (Brother lives there and is available) Available Help at Discharge: Family;Available PRN/intermittently  SLP Evaluation Cognition Overall Cognitive Status: Impaired/Different from baseline Arousal/Alertness: Awake/alert Orientation Level: Oriented X4 Memory: Impaired Memory Impairment: Decreased recall of new information;Decreased short term memory Awareness: Appears intact Problem Solving: Impaired Problem Solving Impairment: Functional complex Safety/Judgment: Appears intact  Comprehension Auditory Comprehension Overall Auditory Comprehension: Appears within functional limits for tasks assessed Expression Expression Primary Mode of Expression: Verbal Verbal Expression Overall Verbal Expression: Appears within functional limits for tasks assessed Written Expression Dominant Hand: Right Written Expression: Not tested Oral Motor Oral Motor/Sensory Function Overall Oral Motor/Sensory Function: Within functional limits Motor Speech Overall Motor Speech: Impaired Respiration: Within functional limits Phonation: Hoarse Resonance: Within functional limits Articulation: Within functional limitis Intelligibility: Intelligibility reduced Word: 75-100% accurate Phrase: 75-100%  accurate Sentence: 75-100% accurate Conversation: 75-100% accurate Motor Planning: Witnin functional limits Effective Techniques: Increased vocal intensity  Care Tool Care Tool Cognition Ability to hear (with hearing aid or hearing appliances if normally used Ability to hear (with hearing aid or hearing appliances if normally used): 0. Adequate - no difficulty in normal conservation, social interaction, listening to TV   Expression of Ideas and Wants Expression of Ideas and Wants: 3. Some difficulty - exhibits some difficulty with expressing needs and ideas (e.g, some words or finishing thoughts) or speech is not clear   Understanding Verbal and Non-Verbal Content Understanding Verbal and Non-Verbal Content: 4. Understands (complex and basic) - clear comprehension without cues or repetitions  Memory/Recall Ability Memory/Recall Ability : Current season;That he or she is in a hospital/hospital unit   Bedside Swallowing Assessment General Date of Onset: 12/22/21 Previous Swallow Assessment: MBS on 1/3: Recommended Dys. 2 textures with thin but has since been upgraded to Dys. 3 textures. Diet Prior to this Study: Dysphagia 3 (soft);Thin liquids Temperature Spikes Noted: No Respiratory Status: Room air History of Recent Intubation: Yes Behavior/Cognition: Alert;Cooperative Oral Cavity - Dentition: Adequate natural dentition Self-Feeding Abilities: Able to feed self Patient Positioning: Upright in bed Baseline Vocal Quality: Hoarse Volitional Cough: Strong Volitional Swallow: Able to elicit  Ice Chips Ice chips: Not tested Thin Liquid Thin Liquid: Impaired Presentation: Straw;Self Fed Pharyngeal  Phase Impairments: Cough - Immediate Other Comments: X 1 out of 8 oz Nectar Thick Nectar Thick Liquid: Not tested Honey Thick Honey Thick Liquid: Not tested Puree Puree: Within functional limits Presentation: Self Fed;Spoon Solid Solid: Impaired Presentation: Self Fed;Spoon Oral  Phase Impairments: Impaired mastication BSE Assessment Risk for Aspiration Impact on safety and function: Mild aspiration risk Other Related Risk Factors: Decreased respiratory status;Deconditioning  Short Term Goals: Week 1: SLP Short Term Goal 1 (Week 1): Patient will consume current diet with minimal overt s/s of aspiration with Mod I for use of swallowing compensatory strategies. SLP Short Term Goal 2 (Week 1): Patient will demonstrate efficient mastication with complete oral clearance with trials of regular textures without overt s/s of aspiration with Mod I over 2 sessions prior to upgrade. SLP Short Term Goal 3 (Week 1): Patient will demonstrate functional problem solving for mildly complex tasks with Min  verbal cues. SLP Short Term Goal 4 (Week 1): Patient will recall new, daily information with Min verbal and visual cues for use of compensatory strategies.  Refer to Care Plan for Long Term Goals  Recommendations for other services: None   Discharge Criteria: Patient will be discharged from SLP if patient refuses treatment 3 consecutive times without medical reason, if treatment goals not met, if there is a change in medical status, if patient makes no progress towards goals or if patient is discharged from hospital.  The above assessment, treatment plan, treatment alternatives and goals were discussed and mutually agreed upon: by patient  Mattalynn Crandle 02/07/2022, 3:43 PM

## 2022-02-07 NOTE — Progress Notes (Signed)
Inpatient Rehabilitation Admission Medication Review by a Pharmacist  A complete drug regimen review was completed for this patient to identify any potential clinically significant medication issues.  High Risk Drug Classes Is patient taking? Indication by Medication  Antipsychotic No   Anticoagulant Yes Heparin SQ - VTE ppx  Antibiotic No   Opioid No   Antiplatelet No   Hypoglycemics/insulin No   Vasoactive Medication Yes Midodrine - orthostatic hypotension  Chemotherapy No   Other Yes Clonazepam - anxiety/sleep Lidocaine, Tylenol - pain Melatonin - sleep Pepcid - acid reflux     Type of Medication Issue Identified Description of Issue Recommendation(s)  Drug Interaction(s) (clinically significant)     Duplicate Therapy     Allergy     No Medication Administration End Date     Incorrect Dose     Additional Drug Therapy Needed     Significant med changes from prior encounter (inform family/care partners about these prior to discharge).    Other  PTA meds not yet resumed: cyclobenzaprine, Levaquin, oxycodone, duonebs Restart PTA meds when and if necessary during CIR admission or at time of discharge, if warranted    Clinically significant medication issues were identified that warrant physician communication and completion of prescribed/recommended actions by midnight of the next day:  No  Name of provider notified for urgent issues identified:   Provider Method of Notification:     Pharmacist comments:   Time spent performing this drug regimen review (minutes):  [place], PharmD, BCPS 02/06/2022 2:03 PM

## 2022-02-07 NOTE — Plan of Care (Signed)
  Problem: RH Swallowing Goal: LTG Patient will consume least restrictive diet using compensatory strategies with assistance (SLP) Description: LTG:  Patient will consume least restrictive diet using compensatory strategies with assistance (SLP) Flowsheets (Taken 02/07/2022 1544) LTG: Pt Patient will consume least restrictive diet using compensatory strategies with assistance of (SLP): Modified Independent Goal: LTG Patient will participate in dysphagia therapy to increase swallow function with assistance (SLP) Description: LTG:  Patient will participate in dysphagia therapy to increase swallow function with assistance (SLP) Flowsheets (Taken 02/07/2022 1544) LTG: Pt will participate in dysphagia therapy to increase swallow function with assistance of (SLP): Modified Independent Goal: LTG Pt will demonstrate functional change in swallow as evidenced by bedside/clinical objective assessment (SLP) Description: LTG: Patient will demonstrate functional change in swallow as evidenced by bedside/clinical objective assessment (SLP) Flowsheets (Taken 02/07/2022 1544) LTG: Patient will demonstrate functional change in swallow as evidenced by bedside/clinical objective assessment: Oral swallow   Problem: RH Problem Solving Goal: LTG Patient will demonstrate problem solving for (SLP) Description: LTG:  Patient will demonstrate problem solving for basic/complex daily situations with cues  (SLP) Flowsheets (Taken 02/07/2022 1544) LTG: Patient will demonstrate problem solving for (SLP): Complex daily situations LTG Patient will demonstrate problem solving for: Supervision   Problem: RH Memory Goal: LTG Patient will demonstrate ability for day to day (SLP) Description: LTG:   Patient will demonstrate ability for day to day recall/carryover during cognitive/linguistic activities with assist  (SLP) Flowsheets (Taken 02/07/2022 1544) LTG: Patient will demonstrate ability for day to day recall: New  information LTG: Patient will demonstrate ability for day to day recall/carryover during cognitive/linguistic activities with assist (SLP): Supervision Goal: LTG Patient will use memory compensatory aids to (SLP) Description: LTG:  Patient will use memory compensatory aids to recall biographical/new, daily complex information with cues (SLP) Flowsheets (Taken 02/07/2022 1544) LTG: Patient will use memory compensatory aids to (SLP): Supervision

## 2022-02-07 NOTE — Progress Notes (Addendum)
PROGRESS NOTE   Subjective/Complaints:  No events overnight. Complaining of 8/10 neck pain this AM, stating she takes percocet at home, asking for pain medication other than tylenol. Ongoing fatigue and stable SOB. No other concerns.   ROS: +fatigue, SOB, cough, pain. Denies fevers, chills, N/V, abdominal pain, constipation, diarrhea, chest pain, new weakness or paraesthesias.    Objective:   DG CHEST PORT 1 VIEW  Result Date: 02/06/2022 CLINICAL DATA:  Shortness of breath EXAM: PORTABLE CHEST 1 VIEW COMPARISON:  Chest x-ray 01/29/2022.  Chest CT 01/11/2022. FINDINGS: Multifocal airspace consolidation throughout the left lung appears stable. Tracheostomy has been removed. Right lung is clear. There is no pleural effusion or pneumothorax. The cardiomediastinal silhouette is stable. No acute fractures are seen. IMPRESSION: 1. Stable multifocal airspace consolidation throughout the left lung. 2. Tracheostomy has been removed. Electronically Signed   By: Darliss Cheney M.D.   On: 02/06/2022 18:31   Recent Labs    02/06/22 1844 02/07/22 0544  WBC 12.7* 11.2*  HGB 9.7* 9.1*  HCT 32.1* 29.6*  PLT 505* 469*   Recent Labs    02/06/22 1844 02/07/22 0544  NA  --  135  K  --  3.2*  CL  --  102  CO2  --  23  GLUCOSE  --  110*  BUN  --  13  CREATININE 0.52 0.50  CALCIUM  --  10.7*    Intake/Output Summary (Last 24 hours) at 02/07/2022 1316 Last data filed at 02/07/2022 0758 Gross per 24 hour  Intake 120 ml  Output --  Net 120 ml        Physical Exam: Vital Signs Blood pressure 108/69, pulse 94, temperature 98 F (36.7 C), resp. rate 16, last menstrual period 01/16/2016, SpO2 97 %. Constitutional: No apparent distress. Appropriate appearance for age.  HENT:  Atraumatic, normocephalic. Eyes: PERRLA. EOMI. Visual fields grossly intact.  Cardiovascular: RRR, no murmurs/rub/gallops. 1+ bilateral Edema. Peripheral pulses 2+   Respiratory: Decreased bibasilar breath sounds. Wet sounding nonproductive cough, somewhat weak. +wheezing form tracheostomy site.  On RA.  Abdomen: + bowel sounds, normoactive. mild distention, no tenderness.  Skin: C/D/I. Reported Stage II L inner buttock ulcer; will have picture placed in chart. Tracheosomy site with mild dark dry drainage, pinpoint opening remaining, no erythema or purulence. MSK: No apparent deformity. Moving all 4 limbs antigravity in bed, against resistance but 4+/5 all extremities.   Neurologic exam:  Cognition: AAO to person, place, time and event.  Language: Fluent, No substitutions or neoglisms. No dysarthria. Names 3/3 objects correctly.  Insight: Good  insight into current condition.  Mood: Pleasant affect, appropriate mood.  Sensation: Equal and intact in BL UE and Les.  Reflexes: 2+ in BL UE and LEs. CN: 2-12 grossly intact.  Coordination: No apparent tremors. No ataxia.   Assessment/Plan: 1. Functional deficits which require 3+ hours per day of interdisciplinary therapy in a comprehensive inpatient rehab setting. Physiatrist is providing close team supervision and 24 hour management of active medical problems listed below. Physiatrist and rehab team continue to assess barriers to discharge/monitor patient progress toward functional and medical goals  Care Tool:  Bathing    Body parts  bathed by patient: Chest, Abdomen, Right upper leg, Left upper leg, Face   Body parts bathed by helper: Left lower leg, Right lower leg, Front perineal area, Buttocks, Right arm, Left arm     Bathing assist Assist Level: Maximal Assistance - Patient 24 - 49%     Upper Body Dressing/Undressing Upper body dressing   What is the patient wearing?: Pull over shirt    Upper body assist Assist Level: Moderate Assistance - Patient 50 - 74%    Lower Body Dressing/Undressing Lower body dressing      What is the patient wearing?: Pants     Lower body assist Assist  for lower body dressing: Maximal Assistance - Patient 25 - 49%     Toileting Toileting    Toileting assist Assist for toileting: Maximal Assistance - Patient 25 - 49%     Transfers Chair/bed transfer  Transfers assist     Chair/bed transfer assist level: Maximal Assistance - Patient 25 - 49%     Locomotion Ambulation   Ambulation assist      Assist level: Maximal Assistance - Patient 25 - 49%   Max distance: 2'   Walk 10 feet activity   Assist  Walk 10 feet activity did not occur: Safety/medical concerns        Walk 50 feet activity   Assist Walk 50 feet with 2 turns activity did not occur: Safety/medical concerns         Walk 150 feet activity   Assist Walk 150 feet activity did not occur: Safety/medical concerns         Walk 10 feet on uneven surface  activity   Assist Walk 10 feet on uneven surfaces activity did not occur: Safety/medical concerns         Wheelchair     Assist Is the patient using a wheelchair?: Yes Type of Wheelchair: Manual    Wheelchair assist level: Supervision/Verbal cueing Max wheelchair distance: 150'    Wheelchair 50 feet with 2 turns activity    Assist        Assist Level: Supervision/Verbal cueing   Wheelchair 150 feet activity     Assist      Assist Level: Supervision/Verbal cueing   Blood pressure 108/69, pulse 94, temperature 98 F (36.7 C), resp. rate 16, last menstrual period 01/16/2016, SpO2 97 %.  Medical Problem List and Plan: 1. 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  Antithrombotics: -DVT/anticoagulation:  Pharmaceutical: Heparin- will try and change to Lovenox- as long as labs in AM are OK -> HgB stable, Cr WNL, switch to Lovenox inj 40 mg daily              -antiplatelet therapy: N/A 3. Pain Management: Lidoderm patch, Tylenol as needed  -  Reports chronic neck pain with Percocet 10 mg at home; was getting Oxy 5 mg Q6H PRN per admission notes; resume Oxy 5 mg Q6H PRN, will wean as tolerated. Cannot continue pain medication on discharge d/t documented history of snorting oxycodone.   - States she gets gabapentin + Lyrica for peripheral neuropathy at home; not currently bothersome; monitor  4. Mood/Behavior/Sleep:              -antipsychotic agents: Clonazepam 0.5 mg 3 times  daily, melatonin 3 mg nightly  5. Neuropsych/cognition: This patient is capable of making decisions on her own behalf.  6. Skin/Wound Care Stage II L inner buttock pressure ulcer- from [place]; tracheostomy opening - will do foam dressing and monitor clinically -stop using Purewick- explained to pt. - Routine skin checks - Continue covering tracheostomy site; healing well, monitor - Will get photos today  7. Fluids/Electrolytes/Nutrition: Routine in and outs with follow-up chemistries - Mild low K on admission labs; added K dur 20 meq 1 tab BID; recheck Monday - Ca elevated but stable, monitor - Low protein/albumin, adding Boost supplement TID for nutrition and wound healing  8.  Respiratory failure/tracheostomy.  Decannulated 1 week ago- no air leak.  Check oxygen saturations every shift- off O2- current sats 96-97% - Admission: Coarse cough with light green sputum- will check f/u CXR  - 1/11 CXR: Multifocal airspace consolidation throughout the left lung appears stable. R lung clear. Vitals stable on RA. Continue current management.    9.  AKI.  Resolved.  Latest creatinine 0.42.  Follow-up chemistries - Cr 0.5 this AM; WNL; encourage fluids with energy expenditure and follow  10.  History of tobacco polysubstance use.  Provide counseling  11.  Prediabetes.  Hemoglobin A1c 6.1.  Blood sugar checks discontinued  12.  Dysphagia.  Dysphagia #2 thin liquids.  Follow-up speech therapy  13. Constipation- LBM 2 days ago- if doesn't go in 1 day, will  intervene  - 1/12: Reports BM this AM   LOS: 1 days A FACE TO FACE EVALUATION WAS PERFORMED  Gertie Gowda 02/07/2022, 1:16 PM

## 2022-02-08 ENCOUNTER — Inpatient Hospital Stay (HOSPITAL_COMMUNITY): Payer: 59

## 2022-02-08 DIAGNOSIS — G7281 Critical illness myopathy: Secondary | ICD-10-CM | POA: Diagnosis not present

## 2022-02-08 LAB — BASIC METABOLIC PANEL
Anion gap: 9 (ref 5–15)
BUN: 13 mg/dL (ref 6–20)
CO2: 23 mmol/L (ref 22–32)
Calcium: 11.2 mg/dL — ABNORMAL HIGH (ref 8.9–10.3)
Chloride: 106 mmol/L (ref 98–111)
Creatinine, Ser: 0.57 mg/dL (ref 0.44–1.00)
GFR, Estimated: >60 mL/min (ref >60)
Glucose, Bld: 120 mg/dL — ABNORMAL HIGH (ref 70–99)
Potassium: 3.3 mmol/L — ABNORMAL LOW (ref 3.5–5.1)
Sodium: 138 mmol/L (ref 135–145)

## 2022-02-08 MED ORDER — SORBITOL 70 % SOLN
60.0000 mL | Freq: Once | Status: DC
  Filled 2022-02-08: qty 60

## 2022-02-08 MED ORDER — SORBITOL 70 % SOLN
960.0000 mL | TOPICAL_OIL | Freq: Once | ORAL | Status: AC
  Administered 2022-02-08: 960 mL via RECTAL
  Filled 2022-02-08: qty 240

## 2022-02-08 MED ORDER — ONDANSETRON HCL 4 MG PO TABS
8.0000 mg | ORAL_TABLET | Freq: Three times a day (TID) | ORAL | Status: DC | PRN
  Administered 2022-02-08: 8 mg via ORAL
  Filled 2022-02-08: qty 2

## 2022-02-08 MED ORDER — SENNOSIDES-DOCUSATE SODIUM 8.6-50 MG PO TABS
2.0000 | ORAL_TABLET | Freq: Two times a day (BID) | ORAL | Status: DC
  Administered 2022-02-08 – 2022-02-10 (×3): 2 via ORAL
  Filled 2022-02-08 (×5): qty 2

## 2022-02-08 MED ORDER — GLYCERIN (LAXATIVE) 2 G RE SUPP
1.0000 | Freq: Once | RECTAL | Status: AC
  Administered 2022-02-08: 1 via RECTAL
  Filled 2022-02-08: qty 1

## 2022-02-08 MED ORDER — POTASSIUM CHLORIDE CRYS ER 20 MEQ PO TBCR
30.0000 meq | EXTENDED_RELEASE_TABLET | Freq: Two times a day (BID) | ORAL | Status: DC
  Administered 2022-02-08 – 2022-02-09 (×4): 30 meq via ORAL
  Filled 2022-02-08 (×4): qty 1

## 2022-02-08 NOTE — Progress Notes (Signed)
Physical Therapy Session Note  Patient Details  Name: Janet Hensley MRN: [id number] Date of Birth: [birth year]  Today's Date: 02/08/2022 PT Missed Time: 45 Minutes Missed Time Reason: Patient ill (Comment) (emesis)   Pt received L sidelying in bed with her family and the nurse present with pt reporting she is still having episodes of emesis and is not feeling better. Nurse present and aware - pt left in the bed in the care of the nurse.  Garin Mata M Apurva Reily 02/08/2022, 3:36 PM

## 2022-02-08 NOTE — IPOC Note (Signed)
Overall Plan of Care [facility]) Patient Details Name: Janet Hensley MRN: [id number] DOB: 1970-05-23  Admitting Diagnosis: Critical illness myopathy  Hospital Problems: Principal Problem:   Critical illness myopathy     Functional Problem List: Nursing Safety, Skin Integrity, Endurance, Medication Management, Nutrition, Pain  PT Balance, Endurance, Motor, Pain, Safety  OT Balance, Endurance, Motor, Pain, Skin Integrity  SLP Behavior, Cognition, Linguistic  TR         Basic ADL's: OT Eating, Grooming, Dressing, Toileting, Bathing     Advanced  ADL's: OT       Transfers: PT Bed Mobility, Bed to Chair, Car, Manufacturing systems engineer, Metallurgist: PT Ambulation, Stairs     Additional Impairments: OT Fuctional Use of Upper Extremity  SLP Swallowing, Communication, Social Cognition expression Memory, Problem Solving  TR      Anticipated Outcomes Item Anticipated Outcome  Self Feeding Mod I  Swallowing  Mod I   Basic self-care  Supervision  Toileting  Supervision   Bathroom Transfers Supervision  Bowel/Bladder  continent x 2  Transfers  Supervision  Locomotion  Supervision  Communication  Mod I  Cognition  Supervision  Pain  less than 4  Safety/Judgment  remain free of falls while in rehab   Therapy Plan: PT Intensity: Minimum of 1-2 x/day ,45 to 90 minutes PT Frequency: 5 out of 7 days PT Duration Estimated Length of Stay: 2-3 Weeks OT Intensity: Minimum of 1-2 x/day, 45 to 90 minutes OT Frequency: 5 out of 7 days OT Duration/Estimated Length of Stay: 2.5-3 weeks SLP Intensity: Minumum of 1-2 x/day, 30 to 90 minutes SLP Frequency: 3 to 5 out of 7 days SLP Duration/Estimated Length of Stay: 2.5-3 weeks   Team Interventions: Nursing Interventions Patient/Family Education, Pain Management, Dysphagia/Aspiration Precaution Training, Medication Management, Discharge Planning, Skin Care/Wound Management, Disease Management/Prevention  PT  interventions Ambulation/gait training, Community reintegration, DME/adaptive equipment instruction, Neuromuscular re-education, Psychosocial support, Stair training, UE/LE Strength taining/ROM, Wheelchair propulsion/positioning, Training and development officer, Discharge planning, Functional electrical stimulation, Pain management, Skin care/wound management, UE/LE Coordination activities, Therapeutic Activities, Cognitive remediation/compensation, Disease management/prevention, Functional mobility training, Patient/family education, Splinting/orthotics, Therapeutic Exercise, Visual/perceptual remediation/compensation  OT Interventions Training and development officer, Cognitive remediation/compensation, Community reintegration, Discharge planning, Disease mangement/prevention, DME/adaptive equipment instruction, Functional electrical stimulation, Functional mobility training, Neuromuscular re-education, Pain management, Patient/family education, Psychosocial support, Self Care/advanced ADL retraining, Skin care/wound managment, Splinting/orthotics, Therapeutic Activities, Therapeutic Exercise, UE/LE Strength taining/ROM, UE/LE Coordination activities, Visual/perceptual remediation/compensation, Wheelchair propulsion/positioning  SLP Interventions Cognitive remediation/compensation, Dysphagia/aspiration precaution training, Internal/external aids, Speech/Language facilitation, Therapeutic Activities, Environmental controls, Cueing hierarchy, Functional tasks, Patient/family education  TR Interventions    SW/CM Interventions     Barriers to Discharge MD  Medical stability  Nursing Inaccessible home environment, Decreased caregiver support 1 level 4 ste with rail on left  PT      OT      SLP      SW       Team Discharge Planning: Destination: PT-Home ,OT- Home , SLP-Home Projected Follow-up: PT-Home health PT, OT-  Home health OT, 24 hour supervision/assistance (vs OPOT), SLP-24 hour  supervision/assistance, Home Health SLP, Outpatient SLP Projected Equipment Needs: PT-To be determined, OT- Tub/shower bench, 3 in 1 bedside comode, SLP-None recommended by SLP Equipment Details: PT- , OT-will likely need a tub bench and BSC Patient/family involved in discharge planning: PT- Patient,  OT-Patient, SLP-Patient  MD ELOS: 2.5 -3wks Medical Rehab Prognosis:  Fair Assessment: The patient has been admitted for CIR therapies with  the diagnosis of CIM. The team will be addressing functional mobility, strength, stamina, balance, safety, adaptive techniques and equipment, self-care, bowel and bladder mgt, patient and caregiver education, Ileus management. Goals have been set at [facility]. Anticipated discharge destination is Home .        See Team Conference Notes for weekly updates to the plan of care

## 2022-02-08 NOTE — Progress Notes (Signed)
Occupational Therapy Session Note  Patient Details  Name: Janet Hensley MRN: [id number] Date of Birth: [birth year]  Today's Date: 02/08/2022 OT Individual Time: 8032-1224 OT Individual Time Calculation (min): 45 min  and Today's Date: 02/08/2022 OT Missed Time: 30 Minutes Missed Time Reason: Patient ill (comment) (nausea and vommiting)   Short Term Goals: Week 1:  OT Short Term Goal 1 (Week 1): Patient will complete toilet transfer with mod A  of 1 OT Short Term Goal 2 (Week 1): Patient will complete 1 step of toileting task OT Short Term Goal 3 (Week 1): Patient will stand at the sink with mod A of 1 in preparation for BADL Task.  Skilled Therapeutic Interventions/Progress Updates:    Pt greeted semi-reclined in bed. Pt reported she has been throwing up this morning. Patients shirt and UE's still had vomit. Per pt, nursing administered nausea medications. Pt completed bed level UB bathing/dressing with max A with multiple rest breaks due to nausea and fatigue. OT educated on hair washing technique and wash tray, but she declined trying right now, although she is very motivated to wash her hair. OT provided pt with ice chips and mouth swabs due to dry mouth and being unable to tolerate any food or drink at this time. Pt missed 30 minutes of OT treatment time due to nausea and vomiting. OT to follow up per plan of care.  Therapy Documentation Precautions:  Precautions Precautions: Fall Restrictions Weight Bearing Restrictions: No General: General OT Amount of Missed Time: 30 Minutes PT Missed Treatment Reason: Patient ill (Comment) (emesis)  Pain:  Abdominal pain and nausea and vomiting    Therapy/Group: Individual Therapy  Valma Cava 02/08/2022, 2:34 PM

## 2022-02-08 NOTE — Progress Notes (Signed)
Patient having multiple episodes of emesis from 0800 until present time; RN unable to admin morning meds, attempted afternoon meds and patient had another emesis episode. Call placed to MD regarding inability for patient to take oral sorbitol and SMOG enema ordered.

## 2022-02-08 NOTE — Progress Notes (Signed)
SLP Cancellation Note  Patient Details Name: Janet Hensley MRN: [id number] DOB: 1970/12/02   Cancelled treatment:        Pt missed treatment. She was vomiting and on bed pan and refused tx.                                                                                                 Gregary Signs A Carley Glendenning 02/08/2022, 9:54 AM

## 2022-02-08 NOTE — Progress Notes (Signed)
PROGRESS NOTE   Subjective/Complaints:  Nausea and vomiting since midnight .  Also with widespread abd pain Feels like she needs a BM and is requesting a suppository   ROS: +fatigue, SOB, cough, pain. Denies fevers, chills, N/V, abdominal pain, constipation, diarrhea, chest pain, new weakness or paraesthesias.    Objective:   DG CHEST PORT 1 VIEW  Result Date: 02/06/2022 CLINICAL DATA:  Shortness of breath EXAM: PORTABLE CHEST 1 VIEW COMPARISON:  Chest x-ray 01/29/2022.  Chest CT 01/11/2022. FINDINGS: Multifocal airspace consolidation throughout the left lung appears stable. Tracheostomy has been removed. Right lung is clear. There is no pleural effusion or pneumothorax. The cardiomediastinal silhouette is stable. No acute fractures are seen. IMPRESSION: 1. Stable multifocal airspace consolidation throughout the left lung. 2. Tracheostomy has been removed. Electronically Signed   By: Ronney Asters M.D.   On: 02/06/2022 18:31   Recent Labs    02/06/22 1844 02/07/22 0544  WBC 12.7* 11.2*  HGB 9.7* 9.1*  HCT 32.1* 29.6*  PLT 505* 469*    Recent Labs    02/07/22 0544 02/08/22 0556  NA 135 138  K 3.2* 3.3*  CL 102 106  CO2 23 23  GLUCOSE 110* 120*  BUN 13 13  CREATININE 0.50 0.57  CALCIUM 10.7* 11.2*     Intake/Output Summary (Last 24 hours) at 02/08/2022 1203 Last data filed at 02/07/2022 1330 Gross per 24 hour  Intake 240 ml  Output --  Net 240 ml         Physical Exam: Vital Signs Blood pressure 130/75, pulse 100, temperature 97.9 F (36.6 C), temperature source Oral, resp. rate 16, last menstrual period 01/16/2016, SpO2 95 %.  General: No acute distress Mood and affect are appropriate Heart: Regular rate and rhythm no rubs murmurs or extra sounds Lungs: Clear to auscultation, breathing unlabored, no rales or wheezes Abdomen: reduced bowel sounds, soft , mild diffuse tenderness, no rebound  ,  nondistended Extremities: No clubbing, cyanosis, or edema Skin: No evidence of breakdown, no evidence of rash Neurologic:  motor strength is 4/5 in bilateral deltoid, bicep, tricep, grip, hip flexor, knee extensors, ankle dorsiflexor and plantar flexor Sensory exam normal sensation to light touch and proprioception in bilateral upper and lower extremities Cerebellar exam normal finger to nose to finger as well as heel to shin in bilateral upper and lower extremities Musculoskeletal: Full range of motion in all 4 extremities. No joint swelling   Assessment/Plan: 1. Functional deficits which require 3+ hours per day of interdisciplinary therapy in a comprehensive inpatient rehab setting. Physiatrist is providing close team supervision and 24 hour management of active medical problems listed below. Physiatrist and rehab team continue to assess barriers to discharge/monitor patient progress toward functional and medical goals  Care Tool:  Bathing    Body parts bathed by patient: Chest, Abdomen, Right upper leg, Left upper leg, Face   Body parts bathed by helper: Left lower leg, Right lower leg, Front perineal area, Buttocks, Right arm, Left arm     Bathing assist Assist Level: Maximal Assistance - Patient 24 - 49%     Upper Body Dressing/Undressing Upper body dressing   What is the patient  wearing?: Pull over shirt    Upper body assist Assist Level: Moderate Assistance - Patient 50 - 74%    Lower Body Dressing/Undressing Lower body dressing      What is the patient wearing?: Pants     Lower body assist Assist for lower body dressing: Maximal Assistance - Patient 25 - 49%     Toileting Toileting    Toileting assist Assist for toileting: Maximal Assistance - Patient 25 - 49%     Transfers Chair/bed transfer  Transfers assist     Chair/bed transfer assist level: Maximal Assistance - Patient 25 - 49%     Locomotion Ambulation   Ambulation assist      Assist  level: Maximal Assistance - Patient 25 - 49%   Max distance: 2'   Walk 10 feet activity   Assist  Walk 10 feet activity did not occur: Safety/medical concerns        Walk 50 feet activity   Assist Walk 50 feet with 2 turns activity did not occur: Safety/medical concerns         Walk 150 feet activity   Assist Walk 150 feet activity did not occur: Safety/medical concerns         Walk 10 feet on uneven surface  activity   Assist Walk 10 feet on uneven surfaces activity did not occur: Safety/medical concerns         Wheelchair     Assist Is the patient using a wheelchair?: Yes Type of Wheelchair: Manual    Wheelchair assist level: Supervision/Verbal cueing Max wheelchair distance: 150'    Wheelchair 50 feet with 2 turns activity    Assist        Assist Level: Supervision/Verbal cueing   Wheelchair 150 feet activity     Assist      Assist Level: Supervision/Verbal cueing   Blood pressure 130/75, pulse 100, temperature 97.9 F (36.6 C), temperature source Oral, resp. rate 16, last menstrual period 01/16/2016, SpO2 95 %.  Medical Problem List and Plan: 1. 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  Antithrombotics: -DVT/anticoagulation:  Pharmaceutical: Heparin- will try and change to Lovenox- as long as labs in AM are OK -> HgB stable, Cr WNL, switch to Lovenox inj 40 mg daily              -antiplatelet therapy: N/A 3. Pain Management: Lidoderm patch, Tylenol as needed  - Reports chronic neck pain with Percocet 10 mg at home; was getting Oxy 5 mg Q6H PRN per admission notes; resume Oxy 5 mg Q6H PRN, will wean as tolerated. Cannot continue pain medication on discharge d/t documented history of snorting oxycodone.   - States she gets gabapentin + Lyrica for peripheral neuropathy at home; not currently bothersome;  monitor  4. Mood/Behavior/Sleep:              -antipsychotic agents: Clonazepam 0.5 mg 3 times daily, melatonin 3 mg nightly  5. Neuropsych/cognition: This patient is capable of making decisions on her own behalf.  6. Skin/Wound Care Stage II L inner buttock pressure ulcer- from [place]; tracheostomy opening - will do foam dressing and monitor clinically -stop using Purewick- explained to pt. - Routine skin checks - Continue covering tracheostomy site; healing well, monitor -  Will get photos today  7. Fluids/Electrolytes/Nutrition: Routine in and outs with follow-up chemistries - Mild low K on admission labs; added K dur 20 meq 1 tab BID; still low increase KCL to - Ca elevated but stable, monitor - Low protein/albumin, adding Boost supplement TID for nutrition and wound healing  8.  Respiratory failure/tracheostomy.  Decannulated 1 week ago- no air leak.  Check oxygen saturations every shift- off O2- current sats 96-97% - Admission: Coarse cough with light green sputum- will check f/u CXR  - 1/11 CXR: Multifocal airspace consolidation throughout the left lung appears stable. R lung clear. Vitals stable on RA. Continue current management.    9.  AKI.  Resolved.  Latest creatinine 0.42.  Follow-up chemistries - Cr 0.5 this AM; WNL; encourage fluids with energy expenditure and follow  10.  History of tobacco polysubstance use.  Provide counseling  11.  Prediabetes.  Hemoglobin A1c 6.1.  Blood sugar checks discontinued  12.  Dysphagia.  Dysphagia #2 thin liquids.  Follow-up speech therapy  13. Constipation- LBM 2 days ago- if doesn't go in 1 day, will intervene Last recorded BM 2 d ago , will give glycerin supp, await KUB   LOS: 2 days A FACE TO FACE EVALUATION WAS PERFORMED  Erick Colace 02/08/2022, 12:03 PM

## 2022-02-08 NOTE — Progress Notes (Signed)
Physical Therapy Session Note  Patient Details  Name: Joeann C Sam MRN: [id number] Date of Birth: 1971-01-02  Today's Date: 02/08/2022 PT Missed Time: 12 Minutes Missed Time Reason: Patient ill (Comment) (emesis)  Pt received supine in bed with her daughter present and pt/daughter report pt had just vomited again stating it was yellow in color. Pt reports she started having stomach pain/discomfort/unsettled feeling around midnight last night then started vomiting around 6:00AM that started as water/clear but has now transitioned to yellow in color. Nurse aware and updated on pt's most recent emesis. Pt denies any needs at this time and left supine in bed with her daughter present.  Tawana Scale , PT, DPT, NCS, CSRS 02/08/2022, 8:04 AM

## 2022-02-09 ENCOUNTER — Inpatient Hospital Stay (HOSPITAL_COMMUNITY): Payer: 59

## 2022-02-09 DIAGNOSIS — G7281 Critical illness myopathy: Secondary | ICD-10-CM | POA: Diagnosis not present

## 2022-02-09 LAB — CBC
HCT: 35.2 % — ABNORMAL LOW (ref 36.0–46.0)
Hemoglobin: 11.7 g/dL — ABNORMAL LOW (ref 12.0–15.0)
MCH: 32.7 pg (ref 26.0–34.0)
MCHC: 33.2 g/dL (ref 30.0–36.0)
MCV: 98.3 fL (ref 80.0–100.0)
Platelets: 419 10*3/uL — ABNORMAL HIGH (ref 150–400)
RBC: 3.58 MIL/uL — ABNORMAL LOW (ref 3.87–5.11)
RDW: 15.3 % (ref 11.5–15.5)
WBC: 17.1 10*3/uL — ABNORMAL HIGH (ref 4.0–10.5)
nRBC: 0 % (ref 0.0–0.2)

## 2022-02-09 LAB — URINALYSIS, ROUTINE W REFLEX MICROSCOPIC
Bacteria, UA: NONE SEEN
Bilirubin Urine: NEGATIVE
Glucose, UA: NEGATIVE mg/dL
Ketones, ur: NEGATIVE mg/dL
Nitrite: NEGATIVE
Protein, ur: 100 mg/dL — AB
RBC / HPF: >50 RBC/hpf — ABNORMAL HIGH (ref 0–5)
Specific Gravity, Urine: 1.019 (ref 1.005–1.030)
WBC, UA: >50 WBC/hpf — ABNORMAL HIGH (ref 0–5)
pH: 6 (ref 5.0–8.0)

## 2022-02-09 MED ORDER — SODIUM CHLORIDE 0.9 % IV SOLN: INTRAVENOUS | Status: DC

## 2022-02-09 MED ORDER — CEPHALEXIN 250 MG PO CAPS
500.0000 mg | ORAL_CAPSULE | Freq: Three times a day (TID) | ORAL | Status: DC
  Administered 2022-02-09 – 2022-02-10 (×3): 500 mg via ORAL
  Filled 2022-02-09 (×3): qty 2

## 2022-02-09 MED ORDER — PROCHLORPERAZINE EDISYLATE 10 MG/2ML IJ SOLN
10.0000 mg | INTRAMUSCULAR | Status: DC | PRN
  Administered 2022-02-09 – 2022-02-10 (×3): 10 mg via INTRAVENOUS
  Filled 2022-02-09 (×3): qty 2

## 2022-02-09 NOTE — Progress Notes (Signed)
   02/09/22 0938  Assess: MEWS Score  BP 118/80  MAP (mmHg) 91  Pulse Rate (!) 117  Resp 18  SpO2 96 %  O2 Device Room Air  Assess: MEWS Score  MEWS Temp 0  MEWS Systolic 0  MEWS Pulse 2  MEWS RR 0  MEWS LOC 0  MEWS Score 2  MEWS Score Color Yellow  Assess: if the MEWS score is Yellow or Red  Were vital signs taken at a resting state? Yes  Focused Assessment Change from prior assessment (see assessment flowsheet)  Does the patient meet 2 or more of the SIRS criteria? No  MEWS guidelines implemented *See Row Information* Yes  Take Vital Signs  Increase Vital Sign Frequency  Yellow: Q 2hr X 2 then Q 4hr X 2, if remains yellow, continue Q 4hrs  Escalate  MEWS: Escalate Yellow: discuss with charge nurse/RN and consider discussing with provider and RRT  Notify: Charge Nurse/RN  Name of Charge Nurse/RN Notified Ronita Hipps RN  Date Charge Nurse/RN Notified 02/09/22  Time Charge Nurse/RN Notified [id number]  Provider Notification  Provider Name/Title Dr. Dagoberto Ligas  Date Provider Notified 02/09/22  Time Provider Notified [phone]  Method of Notification Rounds  Notification Reason Change in status  Provider response At bedside;See new orders  Date of Provider Response 02/09/22  Time of Provider Response 0940  Document  Patient Outcome Stabilized after interventions  Progress note created (see row info) Yes  Assess: SIRS CRITERIA  SIRS Temperature  0  SIRS Pulse 1  SIRS Respirations  0  SIRS WBC 0  SIRS Score Sum  1

## 2022-02-09 NOTE — Psychosocial Assessment (Signed)
Patient called staff for personal care after a bowel movement. Up on opening the brief I noticed a red mucous discharge. When asking the patient if she new anything about this discharge she stated "that this was from when he pulled the tube from my butts". After assessing the sacral/anus area  it was noted that the discharge was of rectal source, but no sign of trauma was present externally. Patient had another incidence that was reported by the NT. Patient also had some episodes of vomiting. PRN Zofran given with some relief.

## 2022-02-09 NOTE — Progress Notes (Addendum)
PROGRESS NOTE   Subjective/Complaints:  Pt reports wants liquid diet, since having N/V- wants meds whole in pudding,etc, since was getting that way in [facility]- refuses to take crushed, since think it's making her ill.  S/P SMOG enema yesterday and been having bright red blood clots coming from rectum since then. Also when pulling it out, was painful/had sharp twinge of pain.   Has vomited x2 this AM and multiple times yesterday.,  Husband at bedside and concerned, appropriately, about pt's intake, feeling ill, etc.    ROS:  Pt denies SOB, abd pain, CP, (+) N/V/C/D, and vision changes   Except for HPI  Objective:   DG Abd 1 View  Result Date: 02/08/2022 CLINICAL DATA:  Nausea and vomiting EXAM: ABDOMEN - 1 VIEW COMPARISON:  01/27/2022 FINDINGS: 2 supine frontal views of the abdomen and pelvis are obtained. No bowel obstruction or ileus. Oral contrast is seen throughout the colon. No masses or abnormal calcifications. Stable consolidation throughout the left lung. IMPRESSION: 1. Unremarkable bowel gas pattern. 2. Stable left-sided airspace disease. Electronically Signed   By: Sharlet Salina M.D.   On: 02/08/2022 15:21   Recent Labs    02/07/22 0544 02/09/22 0943  WBC 11.2* 17.1*  HGB 9.1* 11.7*  HCT 29.6* 35.2*  PLT 469* 419*   Recent Labs    02/07/22 0544 02/08/22 0556  NA 135 138  K 3.2* 3.3*  CL 102 106  CO2 23 23  GLUCOSE 110* 120*  BUN 13 13  CREATININE 0.50 0.57  CALCIUM 10.7* 11.2*    Intake/Output Summary (Last 24 hours) at 02/09/2022 1147 Last data filed at 02/08/2022 1300 Gross per 24 hour  Intake 236 ml  Output --  Net 236 ml        Physical Exam: Vital Signs Blood pressure 118/80, pulse (!) 117, temperature 97.6 F (36.4 C), resp. rate 18, last menstrual period 01/16/2016, SpO2 96 %.    General: awake, alert, appropriate, sitting up in bed; appears that she feels ill; husband at bedside;   NAD HENT: conjugate gaze; pale conjunctivae B/L  oropharynx dry- tongue slightly coated- trach site needs to be changed CV: regular rhythm, tachycardic in 110s-110s- rate; no JVD Pulmonary: very decreased throughout entire L lung- a few rhonchi on L as well- CTA on R GI: soft, NT, distended vs protuberant; hypoactive BS Psychiatric: appropriate- flat; looks like feels ill Neurological: Ox3 evidence of breakdown, no evidence of rash Neurologic:  motor strength is 4/5 in bilateral deltoid, bicep, tricep, grip, hip flexor, knee extensors, ankle dorsiflexor and plantar flexor Sensory exam normal sensation to light touch and proprioception in bilateral upper and lower extremities Cerebellar exam normal finger to nose to finger as well as heel to shin in bilateral upper and lower extremities Musculoskeletal: Full range of motion in all 4 extremities. No joint swelling   Assessment/Plan: 1. Functional deficits which require 3+ hours per day of interdisciplinary therapy in a comprehensive inpatient rehab setting. Physiatrist is providing close team supervision and 24 hour management of active medical problems listed below. Physiatrist and rehab team continue to assess barriers to discharge/monitor patient progress toward functional and medical goals  Care Tool:  Bathing    Body parts bathed by patient: Chest, Abdomen, Right upper leg, Left upper leg, Face   Body parts bathed by helper: Left lower leg, Right lower leg, Front perineal area, Buttocks, Right arm, Left arm     Bathing assist Assist Level: Maximal Assistance - Patient 24 - 49%     Upper Body Dressing/Undressing Upper body dressing   What is the patient wearing?: Pull over shirt    Upper body assist Assist Level: Moderate Assistance - Patient 50 - 74%    Lower Body Dressing/Undressing Lower body dressing      What is the patient wearing?: Pants     Lower body assist Assist for lower body dressing: Maximal Assistance -  Patient 25 - 49%     Toileting Toileting    Toileting assist Assist for toileting: Maximal Assistance - Patient 25 - 49%     Transfers Chair/bed transfer  Transfers assist     Chair/bed transfer assist level: Maximal Assistance - Patient 25 - 49%     Locomotion Ambulation   Ambulation assist      Assist level: Maximal Assistance - Patient 25 - 49%   Max distance: 2'   Walk 10 feet activity   Assist  Walk 10 feet activity did not occur: Safety/medical concerns        Walk 50 feet activity   Assist Walk 50 feet with 2 turns activity did not occur: Safety/medical concerns         Walk 150 feet activity   Assist Walk 150 feet activity did not occur: Safety/medical concerns         Walk 10 feet on uneven surface  activity   Assist Walk 10 feet on uneven surfaces activity did not occur: Safety/medical concerns         Wheelchair     Assist Is the patient using a wheelchair?: Yes Type of Wheelchair: Manual    Wheelchair assist level: Supervision/Verbal cueing Max wheelchair distance: 150'    Wheelchair 50 feet with 2 turns activity    Assist        Assist Level: Supervision/Verbal cueing   Wheelchair 150 feet activity     Assist      Assist Level: Supervision/Verbal cueing   Blood pressure 118/80, pulse (!) 117, temperature 97.6 F (36.4 C), resp. rate 18, last menstrual period 01/16/2016, SpO2 96 %.  Medical Problem List and Plan: 1. 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  Antithrombotics: -DVT/anticoagulation:  Pharmaceutical: Heparin- will try and change to Lovenox- as long as labs in AM are OK -> HgB stable, Cr WNL, switch to Lovenox inj 40 mg daily              -antiplatelet therapy: N/A 3. Pain Management: Lidoderm patch, Tylenol as needed  - Reports  chronic neck pain with Percocet 10 mg at home; was getting Oxy 5 mg Q6H PRN per admission notes; resume Oxy 5 mg Q6H PRN, will wean as tolerated. Cannot continue pain medication on discharge d/t documented history of snorting oxycodone.   - States she gets gabapentin + Lyrica for peripheral neuropathy at home; not currently bothersome; monitor  4. Mood/Behavior/Sleep:              -  antipsychotic agents: Clonazepam 0.5 mg 3 times daily, melatonin 3 mg nightly  5. Neuropsych/cognition: This patient is capable of making decisions on her own behalf.  6. Skin/Wound Care Stage II L inner buttock pressure ulcer- from [place]; tracheostomy opening - will do foam dressing and monitor clinically -stop using Purewick- explained to pt. - Routine skin checks - Continue covering tracheostomy site; healing well, monitor - Will get photos today  7. Fluids/Electrolytes/Nutrition: Routine in and outs with follow-up chemistries - Mild low K on admission labs; added K dur 20 meq 1 tab BID; still low increase KCL to 23meq - Ca elevated but stable, monitor - Low protein/albumin, adding Boost supplement TID for nutrition and wound healing  8.  Respiratory failure/tracheostomy.  Decannulated 1 week ago- no air leak.  Check oxygen saturations every shift- off O2- current sats 96-97% - Admission: Coarse cough with light green sputum- will check f/u CXR  - 1/11 CXR: Multifocal airspace consolidation throughout the left lung appears stable. R lung clear. Vitals stable on RA. Continue current management.    1/14- WBC up to 17- per #15 9.  AKI.  Resolved.  Latest creatinine 0.42.  Follow-up chemistries - Cr 0.5 this AM; WNL; encourage fluids with energy expenditure and follow  1/14- labs in AM 10.  History of tobacco polysubstance use.  Provide counseling  11.  Prediabetes.  Hemoglobin A1c 6.1.  Blood sugar checks discontinued  12.  Dysphagia.  Dysphagia #2 thin liquids.  Follow-up speech therapy  13.  Constipation- LBM 2 days ago- if doesn't go in 1 day, will intervene Last recorded BM 2 d ago , will give glycerin supp, await KUB  1/14- had moderate stool burden- given SMOG enema last night- pt having bright red clots since then  14. BRBPR  1/14- Hb is actually up from 9.1 to 11- so think blood loss is negligible- however will make sure has labs in AM as well.  15. Leukocytosis  1/14- WBC up to 17k from 11k- will check U/A and Cx but if that's negative, think the significant pleural effusion/multiloculated cosolidation on L lung could be getting infected- if need be, wil call ID to discuss- cannot see if this was treatment in [facility] after 1/1 when WBC went up to 20k 16. Nausea/vomiting  1/14- will check another KUB to see if doing better- could be cause of N/V vs a UTI/illness? Changed die to clear liquid diet per pt request.  17. Tachycardia  1/14- has been elevated, but a little more this AM- could be due to leukocytosis- will have low threshold for procalcitonin/more w/u, however O2 sats 96-99%  I spent a total of  59  minutes on total care today- >50% coordination of care- due to d/w phlebotomy, charge nurse x3; and prolonged time in room, also d/w NT about clots in stool.   U/A strongly positive for UTI - started Keflex 500 mg q8 hours and since not voiding a lot, will start NS IVFs 75cc hour for 1 day Got another call, - HR 121 at rest- just received first dose of Keflex- let nursing know that will try  a beta blocker at HR of 130- since hasn't had more than 1-2 hours of IVFs (also vomited at 1300 before IV antinausea medicine) and just got Keflex- if vomits up Keflex, will change to Rocephin.   LOS: 3 days A FACE TO FACE EVALUATION WAS PERFORMED  Kalli Greenfield 02/09/2022, 11:47 AM

## 2022-02-10 DIAGNOSIS — G7281 Critical illness myopathy: Secondary | ICD-10-CM | POA: Diagnosis not present

## 2022-02-10 LAB — COMPREHENSIVE METABOLIC PANEL
ALT: 16 U/L (ref 0–44)
AST: 15 U/L (ref 15–41)
Albumin: 2.2 g/dL — ABNORMAL LOW (ref 3.5–5.0)
Alkaline Phosphatase: 129 U/L — ABNORMAL HIGH (ref 38–126)
Anion gap: 7 (ref 5–15)
BUN: 13 mg/dL (ref 6–20)
CO2: 22 mmol/L (ref 22–32)
Calcium: 10.4 mg/dL — ABNORMAL HIGH (ref 8.9–10.3)
Chloride: 106 mmol/L (ref 98–111)
Creatinine, Ser: 0.46 mg/dL (ref 0.44–1.00)
GFR, Estimated: >60 mL/min (ref >60)
Glucose, Bld: 111 mg/dL — ABNORMAL HIGH (ref 70–99)
Potassium: 3.3 mmol/L — ABNORMAL LOW (ref 3.5–5.1)
Sodium: 135 mmol/L (ref 135–145)
Total Bilirubin: 0.4 mg/dL (ref 0.3–1.2)
Total Protein: 7 g/dL (ref 6.5–8.1)

## 2022-02-10 LAB — CBC WITH DIFFERENTIAL/PLATELET
Abs Immature Granulocytes: 0.14 10*3/uL — ABNORMAL HIGH (ref 0.00–0.07)
Basophils Absolute: 0 10*3/uL (ref 0.0–0.1)
Basophils Relative: 0 %
Eosinophils Absolute: 0.2 10*3/uL (ref 0.0–0.5)
Eosinophils Relative: 1 %
HCT: 31 % — ABNORMAL LOW (ref 36.0–46.0)
Hemoglobin: 9.5 g/dL — ABNORMAL LOW (ref 12.0–15.0)
Immature Granulocytes: 1 %
Lymphocytes Relative: 14 %
Lymphs Abs: 1.9 10*3/uL (ref 0.7–4.0)
MCH: 30.8 pg (ref 26.0–34.0)
MCHC: 30.6 g/dL (ref 30.0–36.0)
MCV: 100.6 fL — ABNORMAL HIGH (ref 80.0–100.0)
Monocytes Absolute: 0.9 10*3/uL (ref 0.1–1.0)
Monocytes Relative: 7 %
Neutro Abs: 11.1 10*3/uL — ABNORMAL HIGH (ref 1.7–7.7)
Neutrophils Relative %: 77 %
Platelets: 476 10*3/uL — ABNORMAL HIGH (ref 150–400)
RBC: 3.08 MIL/uL — ABNORMAL LOW (ref 3.87–5.11)
RDW: 15.5 % (ref 11.5–15.5)
WBC: 14.3 10*3/uL — ABNORMAL HIGH (ref 4.0–10.5)
nRBC: 0 % (ref 0.0–0.2)

## 2022-02-10 MED ORDER — POTASSIUM CHLORIDE 20 MEQ PO PACK
40.0000 meq | PACK | Freq: Two times a day (BID) | ORAL | Status: DC
  Administered 2022-02-10 (×2): 40 meq via ORAL
  Filled 2022-02-10 (×3): qty 2

## 2022-02-10 MED ORDER — OXYCODONE HCL 5 MG PO TABS
2.5000 mg | ORAL_TABLET | Freq: Four times a day (QID) | ORAL | Status: DC | PRN
  Administered 2022-02-11 – 2022-02-27 (×32): 2.5 mg via ORAL
  Filled 2022-02-10 (×37): qty 1

## 2022-02-10 MED ORDER — SODIUM CHLORIDE 0.9 % IV SOLN
1.0000 g | INTRAVENOUS | Status: DC
  Administered 2022-02-10: 1 g via INTRAVENOUS
  Filled 2022-02-10 (×3): qty 10

## 2022-02-10 MED ORDER — ONDANSETRON HCL 4 MG PO TABS
8.0000 mg | ORAL_TABLET | Freq: Three times a day (TID) | ORAL | Status: DC | PRN
  Administered 2022-02-10 – 2022-02-27 (×15): 8 mg via ORAL
  Filled 2022-02-10 (×19): qty 2

## 2022-02-10 MED ORDER — PROCHLORPERAZINE EDISYLATE 10 MG/2ML IJ SOLN
10.0000 mg | INTRAMUSCULAR | Status: DC | PRN
  Administered 2022-02-11 – 2022-02-19 (×3): 10 mg via INTRAVENOUS
  Filled 2022-02-10 (×4): qty 2

## 2022-02-10 NOTE — Progress Notes (Signed)
Occupational Therapy Session Note  Patient Details  Name: Janet Hensley MRN: [id number] Date of Birth: Mar 29, 1970  Today's Date: 02/10/2022 OT Individual Time: 5449-2010 OT Individual Time Calculation (min): 60 min    Short Term Goals: Week 1:  OT Short Term Goal 1 (Week 1): Patient will complete toilet transfer with mod A  of 1 OT Short Term Goal 2 (Week 1): Patient will complete 1 step of toileting task OT Short Term Goal 3 (Week 1): Patient will stand at the sink with mod A of 1 in preparation for BADL Task.  Skilled Therapeutic Interventions/Progress Updates:    Pt greeted semi-reclined in bed, she reported some nausea but was not vommiting and wanted to get her hair washed. Pt completed bed mobility with max A to lift trunk. Pt then agreeable to try to stand with Stedy. Max A sit<>stand with Stedy and bed raised. Stedy transfer to wc. OT assisted with hair washing using hair washing tray leaning back in wc. Pt able to maintain grasp to hold tray. Pt unable to reach high enough to her scalp to assist with washing hair requiring total A. Pt was able to then help brush her hair, but could not get brush all the way to scalp requiring OT assist as well. Pt wanted to stay up in chair at the sink to finish grooming tasks. Pt left in wc at the sink with call bell in reach and case manager in with pt.   Therapy Documentation Precautions:  Precautions Precautions: Fall Restrictions Weight Bearing Restrictions: No Pain: Pain Assessment Pain Scale: 0-10 Pain Score: 0-No pain   Therapy/Group: Individual Therapy  Valma Cava 02/10/2022, 12:41 PM

## 2022-02-10 NOTE — Progress Notes (Signed)
Physical Therapy Session Note  Patient Details  Name: Janet Hensley Decou MRN: [id number] Date of Birth: 1970/07/24  Today's Date: 02/10/2022 PT Individual Time: 8850-2774 PT Individual Time Calculation (min): 71 min   Short Term Goals: Week 1:  PT Short Term Goal 1 (Week 1): Pt will complete bed mobility with minA consistently. PT Short Term Goal 2 (Week 1): Pt will complete bed to chair transfer consistently PT Short Term Goal 3 (Week 1): Pt will ambulate x15' with modA and LRAD.  Skilled Therapeutic Interventions/Progress Updates:     Pt received semi reclined in bed. Agrees to therapy but requests to perform bed level exercises secondary to nausea and diarrhea. PT able to encourage pt to mobilize as able to prevent deconditioning. Pt agreeable.  Supine to sit with modA and cues for sequencing and positioning. Pt stands multiple times from bed with modA/maxA to facilitate anterior weight shift and powering up. In standing, PT assist to pull pants up over hips and perform stand pivot transfer to WC, WC transport to gym for time management. Pt performs activity in parallel bars to work on transfer training, strengthening, and activity tolerance training. Pt performs sit to stand in parallel bars with modA at hips to facilitate anterior weight shift, with cues for hand placement and body mechanics. Pt initially requires modA due to posterior bias, but able to shift weight with cues for engagement of hip extensors, and eventually maintains static standing with CGA. Pt able to stand ~15-25 seconds at a time, prior to requiring rest break. Pt performs x5 total reps of sit to stand with similar assistance levels and cues for sequencing. Pt tends to have slight posterior bias in standing that improves with cueing. WC transport back to room. Squat/stand pivot back to bed with modA. Sit to supine with modA management of bilateral lower extremities. Left semi reclined with all needs within reach.  Therapy  Documentation Precautions:  Precautions Precautions: Fall Restrictions Weight Bearing Restrictions: No  Therapy/Group: Individual Therapy  Breck Coons, PT, DPT 02/10/2022, 4:42 PM

## 2022-02-10 NOTE — Progress Notes (Signed)
Inpatient Rehabilitation Care Coordinator Assessment and Plan Patient Details  Name: Janet Hensley MRN: [id number] Date of Birth: [birth year]  Today's Date: 02/10/2022  Hospital Problems: Principal Problem:   Critical illness myopathy  Past Medical History:  Past Medical History:  Diagnosis Date   Asthma 01/15/2012   Past Surgical History:  Past Surgical History:  Procedure Laterality Date   LEFT HEART CATH AND CORONARY ANGIOGRAPHY N/A 12/24/2021   Procedure: LEFT HEART CATH AND CORONARY ANGIOGRAPHY;  Surgeon: Nelva Bush, MD;  Location: [facility];  Service: Cardiovascular;  Laterality: N/A;   Social History:  reports that she has been smoking cigarettes. She does not have any smokeless tobacco history on file. No history on file for alcohol use and drug use.  Family / Support Systems Marital Status: Married How Long?: verbally seperated Patient Roles: Spouse, Parent Spouse/Significant Other: Janet Hensley (husband) [phone]. Pt reports they are verbally seperated. Children: Two children- Janet Hensley (son) lives in [place], [place], and Janet Hensley (dtr)- lives up the road. Other Supports: brother Janet Hensley- PRN. PT reports he does not stay with her all the time and lives in a camper on someone land. Anticipated Caregiver: PRN support from dtr, son, and husband. Ability/Limitations of Caregiver: Husband works 3rd shift 4:30p-4:30/6:30am.Pt will d/c to home with PRN support from her husband, brother Janet Hensley, and [place]. Caregiver Availability: Intermittent Family Dynamics: Pt lives alone and states her brother stays at her home sometimes as well.  Social History Preferred language: English Religion: [employer] Cultural Background: Pt worked in various job roles until she stopped working in 2016 due to health reasons Education: some college Probation officer - How often do you need to have someone help you when you read instructions, pamphlets, or other written material from your doctor or  pharmacy?: Never Writes: Yes Employment Status: Unemployed Date Retired/Disabled/Unemployed: since 2016 due to medical reasons Legal History/Current Legal Issues: Denies Guardian/Conservator: N/A   Abuse/Neglect Abuse/Neglect Assessment Can Be Completed: Yes Physical Abuse: Denies Verbal Abuse: Denies Sexual Abuse: Denies Exploitation of patient/patient's resources: Denies Self-Neglect: Denies  Patient response to: Social Isolation - How often do you feel lonely or isolated from those around you?: Never  Emotional Status Pt's affect, behavior and adjustment status: Pt in good spirits at time of visit. She just finished OT session and was sitting in wheelchair finishing her hair. Recent Psychosocial Issues: Denies Psychiatric History: Denies Substance Abuse History: Pt admits she quit smoking cigarettes at time of admission, after smoking for 30 yrs 1 ppd. She denies etoh use. Admits to anxiety. Meds prescribed by PCP.  Patient / Family Perceptions, Expectations & Goals Pt/Family understanding of illness & functional limitations: Pt and family have a general understanding of pt care needs Premorbid pt/family roles/activities: Independent Anticipated changes in roles/activities/participation: Assistance with ADL/IADLs Pt/family expectations/goals: Pt states "getting better just wnat to go home and love on my dogs and see my family."  [employer]: None Premorbid Home Care/DME Agencies: None Transportation available at discharge: TBD Is the patient able to respond to transportation needs?: Yes In the past 12 months, has lack of transportation kept you from medical appointments or from getting medications?: No In the past 12 months, has lack of transportation kept you from meetings, work, or from getting things needed for daily living?: No Resource referrals recommended: Neuropsychology  Discharge Planning Living Arrangements: Alone Support Systems:  Spouse/significant other, Children, Other relatives Type of Residence: Private residence Insurance Resources: Multimedia programmer (specify), Medicaid (specify county) ([place] and  [place] Medicaid [employer] [place]) Financial Resources: Family Support Financial Screen Referred: No Living Expenses: Own Money Management: Spouse Does the patient have any problems obtaining your medications?: No Home Management: Pt reports that her spouse manages her finances since she is not working. She states she manages all her home care needs and meals. Patient/Family Preliminary Plans: TBD Care Coordinator Barriers to Discharge: Decreased caregiver support, Lack of/limited family support, Insurance for SNF coverage Care Coordinator Anticipated Follow Up Needs: HH/OP Expected length of stay: 2.5-3 weeks  Clinical Impression SW met with pt in room to introduce self, explain role, inform on ELOS, and discuss discharge process. Pt is not a English as a second language teacher. No HCPOA. States she is verbally separated. She understands her husband would make all of her medical decisions if needed, and she states she is fine with this. No DME. She is aware SW will follow-up with her husband Janet Hensley.   1218-SW spoke with pt husband Janet Hensley to introduce self, explain role, inform on ELOS, and discuss discharge process. He asked SW if able to assist with disability. SW shared unable to assist, and encouraged him to follow-up with pt to discuss if she has a new case opened, or needs to open--either way encouraged them to call [facility] to scheduled a phone interview. He intends to follow-up with pt to discuss in more detail. He is aware SW will follow-up with updates after team conference.   [name] A Viann Nielson 02/10/2022, 2:11 PM

## 2022-02-10 NOTE — Progress Notes (Deleted)
Patient refused scheduled AM medication/ Kefflex. She stated that she will take it until she fully awake. Patient comfortably sleeping at this time.

## 2022-02-10 NOTE — Progress Notes (Signed)
Speech Language Pathology Daily Session Note  Patient Details  Name: Kenslee C Kallen MRN: [id number] Date of Birth: 1970-10-28  Today's Date: 02/10/2022 SLP Individual Time: 8891-6945 SLP Individual Time Calculation (min): 42 min  Short Term Goals: Week 1: SLP Short Term Goal 1 (Week 1): Patient will consume current diet with minimal overt s/s of aspiration with Mod I for use of swallowing compensatory strategies. SLP Short Term Goal 2 (Week 1): Patient will demonstrate efficient mastication with complete oral clearance with trials of regular textures without overt s/s of aspiration with Mod I over 2 sessions prior to upgrade. SLP Short Term Goal 3 (Week 1): Patient will demonstrate functional problem solving for mildly complex tasks with Min verbal cues. SLP Short Term Goal 4 (Week 1): Patient will recall new, daily information with Min verbal and visual cues for use of compensatory strategies.  Skilled Therapeutic Interventions: Skilled treatment session focused on cognitive goals. Upon arrival, patient was asleep in bed and required extra time for arousal. Patient reported concerns regarding "getting nutrients" while on a clear liquid diet. SLP provided patient with a boost juice which she consumed throughout session. Patient also with air leakage around stoma while verbalizing, therefore, a new dressing was applied. SLP facilitated session by administering the Cognistat. Patient scored WFL on all subtests with the exception of mild deficits in orientation, calculations, and short-term memory. SLP provided education regarding results and patient verbalized understanding. Patient left upright in bed with alarm on and all needs within reach. Continue with current plan of care.      Pain No/Denies Pain   Therapy/Group: Individual Therapy  Treena Cosman 02/10/2022, 3:25 PM

## 2022-02-10 NOTE — Care Management (Signed)
Inpatient [facility] Statement of Services  Patient Name:  Janet Hensley  Date:  02/10/2022  Welcome to the [facility].  Our goal is to provide you with an individualized program based on your diagnosis and situation, designed to meet your specific needs.  With this comprehensive rehabilitation program, you will be expected to participate in at least 3 hours of rehabilitation therapies Monday-Friday, with modified therapy programming on the weekends.  Your rehabilitation program will include the following services:  Physical Therapy (PT), Occupational Therapy (OT), Speech Therapy (ST), 24 hour per day rehabilitation nursing, Therapeutic Recreaction (TR), Psychology, Neuropsychology, Care Coordinator, Rehabilitation Medicine, [place], and Other  Weekly team conferences will be held on Tuesdays to discuss your progress.  Your Inpatient Rehabilitation Care Coordinator will talk with you frequently to get your input and to update you on team discussions.  Team conferences with you and your family in attendance may also be held.  Expected length of stay: 2.5-3 weeks  Overall anticipated outcome: Supervision  Depending on your progress and recovery, your program may change. Your Inpatient Rehabilitation Care Coordinator will coordinate services and will keep you informed of any changes. Your Inpatient Rehabilitation Care Coordinator's name and contact numbers are listed  below.  The following services may also be recommended but are not provided by the [facility] will be made to provide these services after discharge if needed.  Arrangements include referral to agencies that provide these services.  Your insurance has been verified to be:  [employer]  Your primary doctor  is:  August Luz  Pertinent information will be shared with your doctor and your insurance company.  Inpatient Rehabilitation Care Coordinator:  Cathleen Corti [phone] or ([phone]  Information discussed with and copy given to patient by: Rana Snare, 02/10/2022, 9:17 AM

## 2022-02-10 NOTE — Progress Notes (Signed)
Occupational Therapy Session Note  Patient Details  Name: Janet Hensley MRN: [id number] Date of Birth: 07/28/1970  Today's Date: 02/10/2022 OT Individual Time: 8841-6606 OT Individual Time Calculation (min): 34 min    Short Term Goals: Week 1:  OT Short Term Goal 1 (Week 1): Patient will complete toilet transfer with mod A  of 1 OT Short Term Goal 2 (Week 1): Patient will complete 1 step of toileting task OT Short Term Goal 3 (Week 1): Patient will stand at the sink with mod A of 1 in preparation for BADL Task.  Skilled Therapeutic Interventions/Progress Updates:    Patient agreeable to participate in OT session. Reports 0/10 pain level although pt just vomited prior to therapy arriving to room. Pt reports that it happened suddenly and woke her up. Nursing in room and aware.    Patient participated in skilled OT session focusing on bed mobility, sitting tolerance, and UB bathing. Therapist facilitate session while monitoring pt's tolerance and nausea level while working on sitting balance while completing UB bathing in order to improve functional performance during self care tasks which require a seated posture. Pt able to transition to seated EOB with Max A provided as therapist provided physical assist to bring legs off the EOB and to bring trunk off HOB. Pt able to maintain sitting balance with SBA. Completed grooming task of wash face with set-up. UB bathing completed with SBA. Pt then transitioned back to supine in bed with Min A. Boost feature on bed utilized to help boost patient. Pt completed bed mobility of rolling right and left with assist from bed railing. At end of session, NT was made aware of pt's recent BM during session and assist was needed for hygiene.     Therapy Documentation Precautions:  Precautions Precautions: Fall Restrictions Weight Bearing Restrictions: No   Therapy/Group: Individual Therapy  Ailene Ravel, OTR/L,CBIS  Supplemental OT - [facility] and  [facility] Secure Chat Preferred   02/10/2022, 7:54 AM

## 2022-02-10 NOTE — Progress Notes (Signed)
Patient refused scheduled Keflex this morning stating that she will take it whenever she is fully awake. Patient sleeping at this moment with no discomfort.

## 2022-02-10 NOTE — Progress Notes (Signed)
Met with patient. Oriented to rehab, oriented to team meetings every Tuesday. Will discuss goals, discharge date, equipment, therapy and barriers. SW will update. Discuss discharge time between 9-11 and PA will go over discharge paperwork, follow up, and medications. Discussed education binder. Discussed respiratory failure and hypoxia. Encouraged IS use during commercials and if sitting up in chair can take deep breathe, hold as long as possible and exhale. Patient sounds congested. Asked if coughing up phlegm. Will see if can get flutter valve. Will enable you to cough up more. Discussed diet modifications. A1c 6.1 and Trig 619. Reports that eats a lot of veggies and uses air fryer.  Reports that doesn't eat sweets.  Encourage to continue to work on diet that making changes. Encouraged to reduce processed foods and condiments, breads, other fats. No nicotine patch and husband doesn't smoke. Encouraged to keep going and adding whole foods once gets back to regular diet. All needs met, call bell in reach.

## 2022-02-10 NOTE — Progress Notes (Signed)
PROGRESS NOTE   Subjective/Complaints:  Ongoing nausea with vomitting this AM; pt reports not related to PO intakes or pills but sometimes exacerbated by pills, resulting in intermittent refusal. She denies fevers, chills, abdominal pain, or Hx reflux. Intermittently refusing keflex d/t nausea with pills.   Pt intermittently refusing bowel medications; educated on need to maintain regular Bms given hard stool on last BM, to help reduce trauma and BRBPR. Patient understanding.    ROS:   ROS: Denies fevers, chills, +N/V, abdominal pain, constipation, diarrhea, SOB, cough, chest pain, new weakness or paraesthesias.    Objective:   DG Abd 1 View  Result Date: 02/09/2022 CLINICAL DATA:  Nausea and vomiting. EXAM: ABDOMEN - 1 VIEW COMPARISON:  Most recent radiograph yesterday. FINDINGS: Divided portable supine views of the abdomen obtained. There is gaseous gastric distension. No small bowel dilatation or evidence of obstruction. Diminished enteric contrast in the colon. Suggestion of featureless short segment of descending colon. No evidence of free air in the supine views. Left lung consolidation is similar were visualized. IMPRESSION: 1. Gaseous gastric distension. No small bowel dilatation or evidence of obstruction. 2. Suggestion of featureless short segment of descending colon, may represent colitis. Electronically Signed   By: Keith Rake M.D.   On: 02/09/2022 13:35   DG Abd 1 View  Result Date: 02/08/2022 CLINICAL DATA:  Nausea and vomiting EXAM: ABDOMEN - 1 VIEW COMPARISON:  01/27/2022 FINDINGS: 2 supine frontal views of the abdomen and pelvis are obtained. No bowel obstruction or ileus. Oral contrast is seen throughout the colon. No masses or abnormal calcifications. Stable consolidation throughout the left lung. IMPRESSION: 1. Unremarkable bowel gas pattern. 2. Stable left-sided airspace disease. Electronically Signed   By:  Randa Ngo M.D.   On: 02/08/2022 15:21   Recent Labs    02/09/22 0943 02/10/22 0612  WBC 17.1* 14.3*  HGB 11.7* 9.5*  HCT 35.2* 31.0*  PLT 419* 476*    Recent Labs    02/08/22 0556 02/10/22 0612  NA 138 135  K 3.3* 3.3*  CL 106 106  CO2 23 22  GLUCOSE 120* 111*  BUN 13 13  CREATININE 0.57 0.46  CALCIUM 11.2* 10.4*     Intake/Output Summary (Last 24 hours) at 02/10/2022 1046 Last data filed at 02/09/2022 1828 Gross per 24 hour  Intake 779.85 ml  Output --  Net 779.85 ml         Physical Exam: Vital Signs Blood pressure (!) 153/87, pulse (!) 108, temperature 98.4 F (36.9 C), resp. rate 16, last menstrual period 01/16/2016, SpO2 97 %.    General: awake, alert, appropriate, sitting up in bed; appears that she feels ill; husband at bedside;  NAD HENT: conjugate gaze; pale conjunctivae B/L  oropharynx dry- tongue slightly coated- trach site needs to be changed CV: regular rhythm, mildly tachycardic; no JVD Pulmonary: Decreased breath sounds in lower L>R lung. No cough or sputum production. CTAB.  GI: soft, NT, nondistended; +hypoactive BS Psychiatric: appropriate- flat;  Neurological: AAOx3. Appropriate.  Musculoskeletal: Moving all 4 limbs in bed antigravity. No joint swelling. BL LE plantarflexion w/ tight heel cords, can passively DF to neutral.    Assessment/Plan: 1.  Functional deficits which require 3+ hours per day of interdisciplinary therapy in a comprehensive inpatient rehab setting. Physiatrist is providing close team supervision and 24 hour management of active medical problems listed below. Physiatrist and rehab team continue to assess barriers to discharge/monitor patient progress toward functional and medical goals  Care Tool:  Bathing    Body parts bathed by patient: Chest, Abdomen, Right upper leg, Left upper leg, Face   Body parts bathed by helper: Left lower leg, Right lower leg, Front perineal area, Buttocks, Right arm, Left arm      Bathing assist Assist Level: Maximal Assistance - Patient 24 - 49%     Upper Body Dressing/Undressing Upper body dressing   What is the patient wearing?: Pull over shirt    Upper body assist Assist Level: Moderate Assistance - Patient 50 - 74%    Lower Body Dressing/Undressing Lower body dressing      What is the patient wearing?: Pants     Lower body assist Assist for lower body dressing: Maximal Assistance - Patient 25 - 49%     Toileting Toileting    Toileting assist Assist for toileting: Total Assistance - Patient < 25%     Transfers Chair/bed transfer  Transfers assist     Chair/bed transfer assist level: Maximal Assistance - Patient 25 - 49%     Locomotion Ambulation   Ambulation assist      Assist level: Maximal Assistance - Patient 25 - 49%   Max distance: 2'   Walk 10 feet activity   Assist  Walk 10 feet activity did not occur: Safety/medical concerns        Walk 50 feet activity   Assist Walk 50 feet with 2 turns activity did not occur: Safety/medical concerns         Walk 150 feet activity   Assist Walk 150 feet activity did not occur: Safety/medical concerns         Walk 10 feet on uneven surface  activity   Assist Walk 10 feet on uneven surfaces activity did not occur: Safety/medical concerns         Wheelchair     Assist Is the patient using a wheelchair?: Yes Type of Wheelchair: Manual    Wheelchair assist level: Supervision/Verbal cueing Max wheelchair distance: 150'    Wheelchair 50 feet with 2 turns activity    Assist        Assist Level: Supervision/Verbal cueing   Wheelchair 150 feet activity     Assist      Assist Level: Supervision/Verbal cueing   Blood pressure (!) 153/87, pulse (!) 108, temperature 98.4 F (36.9 C), resp. rate 16, last menstrual period 01/16/2016, SpO2 97 %.  Medical Problem List and Plan: 1. Functional deficits secondary to ICU myopathy from  ARDS/septic shock             -patient may  shower             -ELOS/Goals: 18-21 days- min A and mod I for SLP  Con't CIR- PT, OT - IPOC done 2.  Antithrombotics: -DVT/anticoagulation:  Pharmaceutical: Heparin- will try and change to Lovenox- as long as labs in AM are OK -> HgB stable, Cr WNL, switch to Lovenox inj 40 mg daily              -antiplatelet therapy: N/A 3. Pain Management: Lidoderm patch, Tylenol as needed  - Reports chronic neck pain with Percocet 10 mg at home; was getting Oxy 5 mg  Q6H PRN per admission notes; resume Oxy 5 mg Q6H PRN, will wean as tolerated. Cannot continue pain medication on discharge d/t documented history of snorting oxycodone.   - States she gets gabapentin + Lyrica for peripheral neuropathy at home; not currently bothersome; monitor  4. Mood/Behavior/Sleep:              -antipsychotic agents: Clonazepam 0.5 mg 3 times daily, melatonin 3 mg nightly  5. Neuropsych/cognition: This patient is capable of making decisions on her own behalf.  6. Skin/Wound Care Stage II L inner buttock pressure ulcer- from [place]; tracheostomy opening - will do foam dressing and monitor clinically -stop using Purewick- explained to pt. - Routine skin checks - Continue covering tracheostomy site; healing well, monitor - Will get photos today  7. Fluids/Electrolytes/Nutrition: Routine in and outs with follow-up chemistries - Mild low K on admission labs; added K dur 20 meq 1 tab BID; still low increase KCL to 41meq  - 1/15: K 3.3; poor PO with intermittent refusals; switch to K powder 40 meq BID for 3 days and re-test - Ca elevated but stable, monitor - Low protein/albumin, adding Boost supplement TID for nutrition and wound healing  8.  Respiratory failure/tracheostomy.  Decannulated 1 week ago- no air leak.  Check oxygen saturations every shift- off O2- current sats 96-97% - Admission: Coarse cough with light green sputum- will check f/u CXR  - 1/11 CXR: Multifocal  airspace consolidation throughout the left lung appears stable. R lung clear. Vitals stable on RA. Continue current management.    1/14- WBC up to 17- per #15  1/14: WBC downtrending; likely source UTI  9.  AKI.  Resolved.  Latest creatinine 0.42.  Follow-up chemistries - Cr 0.5 this AM; WNL; encourage fluids with energy expenditure and follow  1/14- labs in AM  1/15: Cr stable 0.4; on IVF 75/hr for 24 hours; encouraged patient to drink 6x 250 cc water cups today given increased hydration needs with infection  10.  History of tobacco polysubstance use.  Provide counseling  11.  Prediabetes.  Hemoglobin A1c 6.1.  Blood sugar checks discontinued  12.  Dysphagia.  Dysphagia #2 thin liquids.  Follow-up speech therapy  13. Constipation- LBM 2 days ago- if doesn't go in 1 day, will intervene Last recorded BM 2 d ago , will give glycerin supp, await KUB  1/14- had moderate stool burden- given SMOG enema last night- pt having bright red clots since then  1/15: Educated patient on intermittent refusals of bowel meds  14. BRBPR  1/14- Hb is actually up from 9.1 to 11- so think blood loss is negligible- however will make sure has labs in AM as well.   1/15: HgB 9.5; H/H repeat ordered in AM d/t likely hemoconcentrated with prior 11 and no further BRBPR   15. Leukocytosis  1/14- WBC up to 17k from 11k- will check U/A and Cx but if that's negative, think the significant pleural effusion/multiloculated cosolidation on L lung could be getting infected- if need be, wil call ID to discuss- cannot see if this was treatment in [facility] after 1/1 when WBC went up to [facility]  1/15: See above; likely UTI started Keflex 500 mg q8 hours and since not voiding a lot, will start NS IVFs 75cc hour for 1 day -> switched to Rocephin 1 G daily d/t poor PO tolerance, plan to switch back to PO once improved, awaiting cultures  16. Nausea/vomiting  1/14- will check another KUB to see if doing  better- could be cause of N/V vs  a UTI/illness? Changed die to clear liquid diet per pt request.   1/15: Encouraged use ot PRN compazine IV if unable to tolerate Zofran  17. Tachycardia  1/14- has been elevated, but a little more this AM- could be due to leukocytosis- will have low threshold for procalcitonin/more w/u, however O2 sats 96-99%  1/15 - stable, no changes, continue IVF   LOS: 4 days A FACE TO FACE EVALUATION WAS PERFORMED  Angelina Sheriff 02/10/2022, 10:46 AM

## 2022-02-11 ENCOUNTER — Inpatient Hospital Stay (HOSPITAL_COMMUNITY): Payer: 59

## 2022-02-11 DIAGNOSIS — D649 Anemia, unspecified: Secondary | ICD-10-CM

## 2022-02-11 DIAGNOSIS — R1030 Lower abdominal pain, unspecified: Secondary | ICD-10-CM | POA: Diagnosis not present

## 2022-02-11 DIAGNOSIS — K625 Hemorrhage of anus and rectum: Secondary | ICD-10-CM

## 2022-02-11 DIAGNOSIS — R112 Nausea with vomiting, unspecified: Secondary | ICD-10-CM

## 2022-02-11 DIAGNOSIS — G7281 Critical illness myopathy: Secondary | ICD-10-CM | POA: Diagnosis not present

## 2022-02-11 LAB — COMPREHENSIVE METABOLIC PANEL
ALT: 18 U/L (ref 0–44)
AST: 18 U/L (ref 15–41)
Albumin: 2.3 g/dL — ABNORMAL LOW (ref 3.5–5.0)
Alkaline Phosphatase: 125 U/L (ref 38–126)
Anion gap: 12 (ref 5–15)
BUN: 8 mg/dL (ref 6–20)
CO2: 20 mmol/L — ABNORMAL LOW (ref 22–32)
Calcium: 9.9 mg/dL (ref 8.9–10.3)
Chloride: 104 mmol/L (ref 98–111)
Creatinine, Ser: 0.55 mg/dL (ref 0.44–1.00)
GFR, Estimated: >60 mL/min (ref >60)
Glucose, Bld: 79 mg/dL (ref 70–99)
Potassium: 2.9 mmol/L — ABNORMAL LOW (ref 3.5–5.1)
Sodium: 136 mmol/L (ref 135–145)
Total Bilirubin: 0.3 mg/dL (ref 0.3–1.2)
Total Protein: 6.8 g/dL (ref 6.5–8.1)

## 2022-02-11 LAB — URINE CULTURE: Culture: 60000 — AB

## 2022-02-11 LAB — HEMOGLOBIN AND HEMATOCRIT, BLOOD
HCT: 30.2 % — ABNORMAL LOW (ref 36.0–46.0)
Hemoglobin: 9.7 g/dL — ABNORMAL LOW (ref 12.0–15.0)

## 2022-02-11 MED ORDER — POLYETHYLENE GLYCOL 3350 17 G PO PACK
17.0000 g | PACK | Freq: Two times a day (BID) | ORAL | Status: DC
  Administered 2022-02-11 – 2022-02-12 (×2): 17 g via ORAL
  Filled 2022-02-11 (×12): qty 1

## 2022-02-11 MED ORDER — POTASSIUM CHLORIDE 10 MEQ/100ML IV SOLN
10.0000 meq | INTRAVENOUS | Status: AC
  Administered 2022-02-11 – 2022-02-12 (×6): 10 meq via INTRAVENOUS
  Filled 2022-02-11 (×8): qty 100

## 2022-02-11 MED ORDER — IOHEXOL 9 MG/ML PO SOLN
500.0000 mL | ORAL | Status: AC
  Administered 2022-02-11 (×2): 500 mL via ORAL

## 2022-02-11 MED ORDER — SODIUM CHLORIDE 0.9 % IV SOLN
1.0000 g | Freq: Four times a day (QID) | INTRAVENOUS | Status: DC
  Administered 2022-02-11 – 2022-02-14 (×11): 1 g via INTRAVENOUS
  Filled 2022-02-11 (×12): qty 1000

## 2022-02-11 MED ORDER — METOCLOPRAMIDE HCL 5 MG/ML IJ SOLN
10.0000 mg | Freq: Once | INTRAMUSCULAR | Status: AC
  Administered 2022-02-11: 10 mg via INTRAVENOUS
  Filled 2022-02-11: qty 2

## 2022-02-11 MED ORDER — IOHEXOL 350 MG/ML SOLN
75.0000 mL | Freq: Once | INTRAVENOUS | Status: AC | PRN
  Administered 2022-02-11: 75 mL via INTRAVENOUS

## 2022-02-11 NOTE — Progress Notes (Signed)
Occupational Therapy Session Note  Patient Details  Name: Janet Hensley MRN: [id number] Date of Birth: May 18, 1970  Today's Date: 02/11/2022 Session 1 OT Individual Time: 3267-1245 OT Individual Time Calculation (min): 40 min   Session 2 OT Individual Time: 0950-1010 OT Individual Time Calculation (min): 20 min    Short Term Goals: Week 1:  OT Short Term Goal 1 (Week 1): Patient will complete toilet transfer with mod A  of 1 OT Short Term Goal 2 (Week 1): Patient will complete 1 step of toileting task OT Short Term Goal 3 (Week 1): Patient will stand at the sink with mod A of 1 in preparation for BADL Task.  Skilled Therapeutic Interventions/Progress Updates:    Pt greeted semi-reclined in bed asleep. Easy to wake patient, but she stated she does not feel well and has still had nausea and vomiting this morning. Pt declined to get out of bed activity despite encouragement. Pt agreeable to bed level there-ex. OT placed built up handles on level 1 yellow theraband and placed on bed rails. Pt completed 3 sets of 10 chest pull, triceps press, bicep curl, and cross body punch. Pt needed extended rest breaks in between sets. Discussed goals of therapy and provided emotional support to patient as she discussed her extensive medical issues. Pt left semi-reclined in bed with needs met.  Session 2 Pt greeted sidelying in bed asleep. Pt declined to get up again despite max encouragement. Pt agreeable to bed level there-ex in sidelying. Pt able to get increased shoulder flexion in gravity eliminated position. Glute squeezes 10 reps with 5 second hold. Pt declined further activity and left semi-reclined in bed with needs met.   Therapy Documentation Precautions:  Precautions Precautions: Fall Restrictions Weight Bearing Restrictions: No Pain: Pain Assessment Pain Scale: 0-10 Pain Score: 0-No pain  Patient missed 25 of OT treatment minutes due to nausea and fatigue  Therapy/Group: Individual  Therapy  Valma Cava 02/11/2022, 12:33 PM

## 2022-02-11 NOTE — Patient Care Conference (Signed)
Inpatient RehabilitationTeam Conference and Plan of Care Update Date: 02/11/2022   Time: 10:47 AM    Patient Name: Janet Hensley      Medical Record Number: [id number]  Date of Birth: September 24, 1970 Sex: Female         Room/Bed: 4W03C/4W03C-01 Payor Info: Payor: Holland Falling / Plan: [employer]  / Product Type: *No Product type* /    Admit Date/Time:  02/06/2022  5:50 PM  Primary Diagnosis:  Critical illness myopathy  Hospital Problems: Principal Problem:   Critical illness myopathy Active Problems:   Lower abdominal pain   Rectal bleeding   Anemia   Nausea and vomiting    Expected Discharge Date: Expected Discharge Date: 02/28/22  Team Members Present: Physician leading conference: Dr. Durel Salts Social Worker Present: Loralee Pacas, Ken[place] Nurse Present: Tacy Learn, RN PT Present: Tereasa Coop, PT OT Present: Cherylynn Ridges, OT SLP Present: Weston Anna, SLP PPS Coordinator present : Gunnar Fusi, SLP     Current Status/Progress Goal Weekly Team Focus  Bowel/Bladder   Incontinent of bowel and bladder. Continues to have type 7 stools. Episodes of no production of urine for 12 hours with no urine when scanned.   Work towards contience of bowel and bladder.   Encourage patient to drink less juice and soda. Encourage patient to drink water. Assess toliet needs Qshift and PRN.    Swallow/Nutrition/ Hydration   Clear liquid due to N/V but was tolerating Dys. 3 textures with thin liquids from a swallowing safety standpoint with intermittent supervision   Mod I  tolerance of solids, use of swallowing compensatory strategies    ADL's   Max A overall for BADLs, transfers, and toileting   Supervision overall   activity tolerance, UB ther-ex, transfers, self-care retraining,    Mobility   modA bed mobility and squat/stand pivot transfer, sit to stand in parallel bars   Supervision  activity tolerance, global strengthening, endurance, balance, transfers,  ambulation    Communication                Safety/Cognition/ Behavioral Observations  Min A   Supervision   complex problem solving and functional recall    Pain   Continues to endorse back pain.   Back pain ranges from level of 5 to 10. Work towards pain goal of <4 or less.   Assess pain QShift, every 4 hours, and pre medicate prior to therapy sessions.    Skin   Incision throat site of stoma. MASD saccrum/buttox area.   Continue to monitor and assess skin daily. Avoid further breakdown of skin integrity and avoid skin related infections.  Monitor skin QShift, PRN,      Discharge Planning:  Pt will discharge to home with PRN support from her brother, dtr, and husband. SW will confirm there are no barriers to discharge.   Team Discussion: Critical illness myopathy. Continent B/B. Constipation. Stools have been red/mucus and loose. KUB completed. CT of abdomin pelvis with contrast. NPO. NV with poor PO intake. Stoma healing. Flutter valve. Wounds healing. IV ABT for UTI. N/V and pain limiting therapies. BUE/BLE very weak. No gait. Cognition is good but does have short term deficits.  Patient on target to meet rehab goals: Limited due to medical issues.  *See Care Plan and progress notes for long and short-term goals.   Revisions to Treatment Plan:  NPO. CT abdomen pelvis with contrast. Medication adjustments, IV ABTs tx for UTI, D3 diet downgraded to clear liquids, now NPO Teaching  Needs: Medication, safety, diet, self care, gait/transfer training, skin/wound care, etc.   Current Barriers to Discharge: Decreased caregiver support, IV antibiotics, Wound care, and Weight  Possible Resolutions to Barriers: Family education, nursing education, order recommended DME     Medical Summary Current Status: medically complicated by respiratory failure, constipatio, ABLA, hypokalemia, UTI on antibiotics, bowel/bladder incontinence, nausea/vomitting, and poor PO intakes  Barriers  to Discharge: Behavior/Mood;Electrolyte abnormality;Inadequate Nutritional Intake;Infection/IV Antibiotics;Medical stability;Self-care education;Incontinence  Barriers to Discharge Comments: Nausea causing poor nutritional intakes, UTI, deconditioning Possible Resolutions to [employer] Focus: treatment of UTI with antibiotics, symptomatic treatment of nausea, decrease bowel meds, encouraging PO intakes   Continued Need for Acute Rehabilitation Level of Care: The patient requires daily medical management by a physician with specialized training in physical medicine and rehabilitation for the following reasons: Direction of a multidisciplinary physical rehabilitation program to maximize functional independence : Yes Medical management of patient stability for increased activity during participation in an intensive rehabilitation regime.: Yes Analysis of laboratory values and/or radiology reports with any subsequent need for medication adjustment and/or medical intervention. : Yes   I attest that I was present, lead the team conference, and concur with the assessment and plan of the team.   Ernest Pine 02/11/2022, 2:16 PM

## 2022-02-11 NOTE — Progress Notes (Signed)
Patient ID: Janet Hensley, female   DOB: [birth year], 52 y.o.   MRN: [id number]  SW met with pt to provide updates from team conference, and d/c date 2/2. Pt husband Gerald Stabs on phone at time of visit. SW reiterated pt will be supervision; 24/7 care at discharge. SW discussed family education. He reports he will speak with her dtr and brother about coming in for family edu. He confirms his work schedule dis 6am-4:30pm.   SW will continue to assess for discharge needs.   Loralee Pacas, MSW, [place] Office: [phone] Cell: [phone] Fax: [phone]

## 2022-02-11 NOTE — Consult Note (Signed)
[facility] Consult: 11:19 AM 02/11/2022  LOS: 5 days    Referring Provider: Dr Tressa Busman at [facility].    Primary Care Physician:  Rutherford Limerick, PA Primary Gastroenterologist:  unassigned     Reason for Consultation:  colitis?.  Bloody stool, abdominal pain,  and non-bloody  N/V   HPI: Janet Hensley is a 52 y.o. female.  Listed address in [place], [place] but EMS transported her from [place] to [facility].   Past history smoking, COPD, asthma.  Narcotics/polysubstance abuse (snorting crushed oxycodone PTA).  Chronic back/neck pain.  Opioid induced constipation, has been taking Relastor for about 1 year, this is not listed on oupt med list.  Sp C section, wrist and back surgeries. No previous colonoscopy or upper endoscopy. 01/2013 CTAP w contrast for abd pain: Partial SBO with increased caliber of proximal mid small bowel loops and air-fluid levels.  Transition point at RLQ suspicious for adhesion.  Prominent distal colon wall may reflect incomplete distention but correlate for symptoms of colitis.  Hepatic steatosis.  Right renal calculi.  Admission [facility] 11/26 - 12/28/2021 with respiratory failure, severe CAP left lung consolidation, lung aspirate positive for Pseudomonas, stenotrophomonas, COPD flare, severe sepsis/ARDS, Rothia Mucilaginosa bacteremia, urine positive for Legionella, AKI, thrombocytopenia, severe PCM.  Transferred to [facility] for ECMO.    Admission [facility] 12/2 -01/08/2022.  Treated with ECMO.  Difficulty weaning off vent, tracheostomy 12/8 with eventual wean to trach collar.  Occasional hypotension requiring low-dose Levophed and then midodrine.    Transferred to [facility] 12/13 - 02/06/22.  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  Trach  decannulated, weaned off supplemental oxygen.  Diet advanced to dysphagia 2, thin liquids.  AKA resolved.  Admission to CIR on 1/11 for debilitation, critical illness myopathy.  Had/has persistent congested cough, greenish sputum. Non-bloody n/v, diffuse abd pain started PM 1/12.  BM last on 1/11, rcvd glycerine suppository 1/13.  1/13 KUB unremarkable.  Rcvd SMOG enema later that day in setting of persitent N/V. Mid day on 1/14 had discharge of blood PR.  N/V, abdominal pain have steadily improved.  She vomits after po intake, only 1 episode this Am, no CGE or bleed.  Today's BM is not bloody.  PTA no issues w N/V or significant reflux.  No dysphagia.  Wt stable PTA.     KUB shows this gaseous gastric distention.  Featureless short segment of descending colon, possibly colitis.  Smoker ~ 1PPD.  Drinks hard Lemonade, 2 12oz bottles at most 2 d a week on weekends.   Fm Hx negative for PUD, gastric or colorectal cancers   Past Medical History:  Diagnosis Date   Asthma 01/15/2012    Past Surgical History:  Procedure Laterality Date   LEFT HEART CATH AND CORONARY ANGIOGRAPHY N/A 12/24/2021   Procedure: LEFT HEART CATH AND CORONARY ANGIOGRAPHY;  Surgeon: Nelva Bush, MD;  Location: [facility];  Service: Cardiovascular;  Laterality: N/A;    Prior to Admission medications   Medication Sig Start Date End  Date Taking? Authorizing Provider  acetaminophen (TYLENOL) 160 MG/5ML solution Take 20.3 mLs (650 mg total) by mouth every 6 (six) hours. 01/08/22   Mick Sell, PA-C  clonazePAM (KLONOPIN) 0.25 MG disintegrating tablet Place 1 tablet (0.25 mg total) into feeding tube at bedtime. Take 0.25mg  daily at bedtime for 5 days then take daily as needed for anxiety or sleep 01/08/22   Candee Furbish, MD  cyclobenzaprine (FLEXERIL) 10 MG tablet Take 10 mg by mouth 3 (three) times daily as needed for muscle spasms.    [provider]  famotidine (PEPCID) 20 MG tablet Place 1 tablet  (20 mg total) into feeding tube 2 (two) times daily. 12/28/21   Chawla, Harsh, MD  fiber supplement, BANATROL TF, liquid Place 60 mLs into feeding tube 2 (two) times daily. 01/08/22   Mick Sell, PA-C  heparin 5000 UNIT/ML injection Inject 1 mL (5,000 Units total) into the skin every 8 (eight) hours. 01/08/22   Mick Sell, PA-C  insulin aspart (NOVOLOG) 100 UNIT/ML injection Inject 2-6 Units into the skin every 4 (four) hours. 01/08/22   Mick Sell, PA-C  ipratropium-albuterol (DUONEB) 0.5-2.5 (3) MG/3ML SOLN Take 3 mLs by nebulization every 4 (four) hours as needed. 01/08/22   Mick Sell, PA-C  levofloxacin (LEVAQUIN) 750 MG/150ML SOLN Inject 150 mLs (750 mg total) into the vein daily. 01/09/22   Mick Sell, PA-C  midodrine (PROAMATINE) 10 MG tablet Place 1 tablet (10 mg total) into feeding tube every 8 (eight) hours. 01/08/22   Mick Sell, PA-C  oxyCODONE 7.5 MG TABS Place 5 mg into feeding tube every 6 (six) hours. Decrease dose or frequency over the next few days with the goal for discontinuation as tolerated 01/08/22   Candee Furbish, MD  Water For Irrigation, Sterile (FREE WATER) SOLN Place 250 mLs into feeding tube every 4 (four) hours. 01/08/22   Mick Sell, PA-C    Scheduled Meds:  clonazePAM  0.5 mg Oral TID   enoxaparin (LOVENOX) injection  40 mg Subcutaneous Q24H   famotidine  20 mg Oral BID   feeding supplement  1 Container Oral TID BM   influenza vac split quadrivalent PF  0.5 mL Intramuscular Tomorrow-1000   lidocaine  1 patch Transdermal Q24H   melatonin  3 mg Oral QHS   midodrine  10 mg Oral TID WC   pneumococcal 20-valent conjugate vaccine  0.5 mL Intramuscular Tomorrow-1000   sorbitol  60 mL Oral Once   Infusions:  cefTRIAXone (ROCEPHIN)  IV 1 g (02/10/22 2244)   PRN Meds: acetaminophen, ondansetron, oxyCODONE, prochlorperazine   Allergies as of 02/06/2022   (No Known Allergies)    History reviewed. No pertinent family history.  Social  History   Socioeconomic History   Marital status: Legally Separated    Spouse name: Not on file   Number of children: Not on file   Years of education: Not on file   Highest education level: Not on file  Occupational History   Not on file  Tobacco Use   Smoking status: Every Day    Types: Cigarettes   Smokeless tobacco: Not on file  Substance and Sexual Activity   Alcohol use: Not on file   Drug use: Not on file   Sexual activity: Not on file  Other Topics Concern   Not on file  Social History Narrative   Not on file   Social Determinants of Health   Financial Resource Strain: Not on  file  Food Insecurity: Not on file  Transportation Needs: Not on file  Physical Activity: Not on file  Stress: Not on file  Social Connections: Not on file  Intimate Partner Violence: Not on file    REVIEW OF SYSTEMS: Constitutional: Weakness.  Unable to stand or transfer without max assistance. ENT:  No nose bleeds Pulm: Continues to have cough productive of yellow sputum.  Dyspnea with exertion continues. CV:  No palpitations, no LE edema.  No angina. GU:  No hematuria, no frequency GI: See HPI. Heme: Denies unusual or excessive bleeding or bruising. Transfusions: None. Neuro: Limb weakness.  No headaches, no peripheral tingling or numbness Derm:  No itching, no rash or sores.  Endocrine:  No sweats or chills.  No polyuria or dysuria Immunization: No records of previous immunizations/vaccinations. Travel: Not queried.   PHYSICAL EXAM: Vital signs in last 24 hours: Vitals:   02/11/22 0427 02/11/22 0500  BP: 137/78 126/86  Pulse: 99 (!) 103  Resp: 18 20  Temp: 98 F (36.7 C) 98.3 F (36.8 C)  SpO2: 100% 98%   Wt Readings from Last 3 Encounters:  01/08/22 83.3 kg  12/28/21 88.2 kg    General: Patient is obese, looks debilitated and chronically ill.  She is alert.  Was sitting in the wheelchair and appeared comfortable. Head: No facial asymmetry or signs of head  trauma. Eyes: No conjunctival pallor or scleral icterus. Ears: Not hard of hearing Nose: No discharge or congestion. Mouth: Mucosa is moist, pink, clear.  Tongue midline. Neck: JVD, no thyromegaly. Lungs: Diminished breath sounds but clear.  Frequent wet, mucoid sounding cough. Heart: Tachycardic with regular rate.  No MRG. Abdomen: Obese with striae.  No hernias.  Not tender, not distended.  Bowel sounds active..   Rectal: External hemorrhoidal tags.  No palpable masses.  Very scant amount of brown stool tests FOBT negative on card. Musc/Skeltl: No joint redness, swelling or gross deformity. Extremities: Slight pedal swelling without pitting. Neurologic: Oriented x 3.  Fluid speech.  Weakness of limbs generally.  Required full assistance to stand up from the wheelchair and transfer to the bed.  Even when she tried to scoot her body up towards the head of the bed, she required maximal assistance. Skin: No significant bruising, rashes or sores Nodes: No cervical adenopathy Psych: Calm, pleasant.  Intake/Output from previous day: No intake/output data recorded. Intake/Output this shift: No intake/output data recorded.  LAB RESULTS: Recent Labs    02/09/22 0943 02/10/22 0612 02/11/22 1059  WBC 17.1* 14.3*  --   HGB 11.7* 9.5* 9.7*  HCT 35.2* 31.0* 30.2*  PLT 419* 476*  --    BMET Lab Results  Component Value Date   NA 135 02/10/2022   NA 138 02/08/2022   NA 135 02/07/2022   K 3.3 (L) 02/10/2022   K 3.3 (L) 02/08/2022   K 3.2 (L) 02/07/2022   CL 106 02/10/2022   CL 106 02/08/2022   CL 102 02/07/2022   CO2 22 02/10/2022   CO2 23 02/08/2022   CO2 23 02/07/2022   GLUCOSE 111 (H) 02/10/2022   GLUCOSE 120 (H) 02/08/2022   GLUCOSE 110 (H) 02/07/2022   BUN 13 02/10/2022   BUN 13 02/08/2022   BUN 13 02/07/2022   CREATININE 0.46 02/10/2022   CREATININE 0.57 02/08/2022   CREATININE 0.50 02/07/2022   CALCIUM 10.4 (H) 02/10/2022   CALCIUM 11.2 (H) 02/08/2022   CALCIUM 10.7  (H) 02/07/2022   LFT Recent Labs  02/10/22 0612  PROT 7.0  ALBUMIN 2.2*  AST 15  ALT 16  ALKPHOS 129*  BILITOT 0.4   PT/INR No results found for: "INR", "PROTIME" Hepatitis Panel No results for input(s): "HEPBSAG", "HCVAB", "HEPAIGM", "HEPBIGM" in the last 72 hours. C-Diff No components found for: "CDIFF" Lipase     Component Value Date/Time   LIPASE 64 (H) 01/13/2022 0202   LIPASE 159 02/16/2013 1313    Drugs of Abuse  No results found for: "LABOPIA", "COCAINSCRNUR", "LABBENZ", "AMPHETMU", "THCU", "LABBARB"   RADIOLOGY STUDIES: No results found.    IMPRESSION:     Bloody stoool, N/V.  KUB raising concern for colitis in desc colon.  Bleeding occurred after manipulation for SMOG enema so bleeding could have occurred as a result of enema related trauma, stroke coral ulcer/stercoral colitis.  2015 CTAP w PSBO, distal colonic prominence.  Pre-existing diagnosis of opioid induced constipation treated with [employer].    Dysphagia.  SLP following.  On D2, thin liquid diet.     Severe CAP w sepsis, ARDS, required ECMO, trach/vent.  Now off vent, trach decannulated.      Macrocytic anemia.  Hgb fluctuating up/down.  Hgb 9.9 at baseline (day 2 [facility] admit 12/24/22).  MCV baseline 104.      Elevated LFTs, improved.  Now just alk phos elevated. Over past few months: T bili 3.4.. 0.4.  alk phos 142.. 129.  AST/ALT 168/61... 15/16.  ETOH level not elevated on 11/26.  Fatty liver on CT in 2015      Bacteruria, UTI.  Latest U/A of 1/14 growing 60 k colonies e coli, 30 k colonies enterococcus.  Rocephin day 2/7.    PLAN:       CTAP.  Will add dose of reglan to help her tolerate po contrast.   AKI long resolved so ok for IV contrast.   Patient agreeable to proceed.  Check anemia panel to evaluate macrocytosis/anemia.  The attending rehab doctor ordered CRP, I deferred this to the morning in order to avoid additional blood stick today.   Azucena Freed  02/11/2022, 11:19  AM Phone [phone]

## 2022-02-11 NOTE — Progress Notes (Signed)
Received a call from Dr Loletha Carrow regarding CT results, he related  no recommendation at this time, they will F/U in the morning.

## 2022-02-11 NOTE — Progress Notes (Signed)
Physical Therapy Session Note  Patient Details  Name: Janet Hensley MRN: [id number] Date of Birth: 27-Sep-1970  Today's Date: 02/11/2022 PT Individual Time: 1104-1200 and 1430-1529 PT Individual Time Calculation (min): 56 min and 59 min  Short Term Goals: Week 1:  PT Short Term Goal 1 (Week 1): Pt will complete bed mobility with minA consistently. PT Short Term Goal 2 (Week 1): Pt will complete bed to chair transfer consistently PT Short Term Goal 3 (Week 1): Pt will ambulate x15' with modA and LRAD.  Skilled Therapeutic Interventions/Progress Updates:     1st Session: Pt received semi reclined in bed and agrees to therapy. Reports pain in low back. Number not provided and chronic in nature. Pt performs supine to sit with modA to facilitate logrolling and sequencing. Seated on edge of bed. PT provides modA for upper and lower body dressing. ModA for sit to stand with facilitation of anterior weight shift and powering up. Pants pulled up over hips in standing and pt perform stand step transfer to [facility] with modA and cues for sequencing and positioning. Pt self propels WC to fatigue, x100' with bilateral upper extremities and cues for more efficient propulsion technique. PT provides WC transport remainder of way to gym. Pt performs activity in parallel bars for transfer training, strengthening, and balance. Initially pt performs x20 alternating LAQs prior to standing activity. Pt able to complete x2 reps of sit to stand in parallel bars with modA to facilitate anterior weight shift and hip extension. Pt able to remain standing up to 2 minutes with light minA to facilitate anterior weight transition. Following extended seated rest break, pt stands and after standing for 1 minute, performed x10 heel raises (barely clears heels). WC transport back to room. Left seated in WC with all needs within reach.  2nd Session: Pt received semi reclined in bed and initially tries to refuse therapy due to fatigue, but is  agreeable following gentle yet firm encouragement. Pt reports chronic pain in back. Number not provided. PT provides rest breaks as needed to manage pain. Supine to sit with modA and cues for logrolling and body mechanics. Stand pivot from bed to [facility] with modA and facilitation of hip extension, upright posture, and sequencing. WC transport to gym. Pt performs activities in standing frame to promote increased activity tolerance, neutral posture in standing with improved body mechanics, strength, and endurance. Pt performs lateral weigh shifting to position sling. Sit to stand with totalA and use of standing frame. In standing, pt cued to perform peg board pattern recreation to challenge fine motor skills as well as cognition. Pt is able to recreate one full pattern, >10 minutes in standing, with occasional cues to correct pattern, as well as cues to engage hip extensors and core muscles for improved body mechanics. Pt takes extended seated rest break then stands again with totalA. While standing, pt reports heart "flutttering". Pt returns to sitting and vitals assessed. HR is at 134 bpm with oxygen at 96% on room air. Pt cued to utilize pursed lip breathing to slow HR and optimize oxygen sats. HR slowly decreases to ~115 bpm after several minutes. WC transport back to room. Stand pivot to bed with modA and modA for sit to supine. Left with alarm intact and all needs within reach.  Therapy Documentation Precautions:  Precautions Precautions: Fall Restrictions Weight Bearing Restrictions: No    Therapy/Group: Individual Therapy  Breck Coons, PT, DPT 02/11/2022, 5:03 PM

## 2022-02-11 NOTE — Progress Notes (Signed)
PROGRESS NOTE   Subjective/Complaints:  Ongoing nausea with vomitting overnight, 3x stools with nursing report 2 as liquid, per charge actually mucusy and red tinged. KUB from 1/14 reviewed, ? colitis. Patient POS 0-20%, endorsing vomitting and nausea, unable to keep down food or intermittently PO medications. Denies fevers, chills, abdominal pain.    ROS:   ROS: Denies fevers, chills, +N/V, abdominal pain, constipation, diarrhea, SOB, cough, chest pain, new weakness or paraesthesias.    Objective:   DG Abd 1 View  Result Date: 02/09/2022 CLINICAL DATA:  Nausea and vomiting. EXAM: ABDOMEN - 1 VIEW COMPARISON:  Most recent radiograph yesterday. FINDINGS: Divided portable supine views of the abdomen obtained. There is gaseous gastric distension. No small bowel dilatation or evidence of obstruction. Diminished enteric contrast in the colon. Suggestion of featureless short segment of descending colon. No evidence of free air in the supine views. Left lung consolidation is similar were visualized. IMPRESSION: 1. Gaseous gastric distension. No small bowel dilatation or evidence of obstruction. 2. Suggestion of featureless short segment of descending colon, may represent colitis. Electronically Signed   By: Narda Rutherford M.D.   On: 02/09/2022 13:35   Recent Labs    02/09/22 0943 02/10/22 0612  WBC 17.1* 14.3*  HGB 11.7* 9.5*  HCT 35.2* 31.0*  PLT 419* 476*    Recent Labs    02/10/22 0612  NA 135  K 3.3*  CL 106  CO2 22  GLUCOSE 111*  BUN 13  CREATININE 0.46  CALCIUM 10.4*    No intake or output data in the 24 hours ending 02/11/22 0909       Physical Exam: Vital Signs Blood pressure 126/86, pulse (!) 103, temperature 98.3 F (36.8 C), temperature source Oral, resp. rate 20, last menstrual period 01/16/2016, SpO2 98 %.    General: awake, alert, appropriate, laying in bed; appears ill;  NAD HENT: conjugate gaze;  pale conjunctivae B/L, moist mucus membranes. Trach site pinpoint opening, clean.  CV: regular rhythm, regular rate, no m.r.g; no JVD Pulmonary: Decreased breath sounds in lower L>R lung. No cough or sputum production. CTAB.  GI: soft, nondistended; +hypoactive BS. +Mild TTP RLQ, no rebound tenderness.  Psychiatric: appropriate- flat Neurological: AAOx3. Appropriate. CN 2-12 intact.  Musculoskeletal: Moving all 4 limbs in bed. BL LE plantarflexion w/ tight heel cords.   Assessment/Plan: 1. Functional deficits which require 3+ hours per day of interdisciplinary therapy in a comprehensive inpatient rehab setting. Physiatrist is providing close team supervision and 24 hour management of active medical problems listed below. Physiatrist and rehab team continue to assess barriers to discharge/monitor patient progress toward functional and medical goals  Care Tool:  Bathing    Body parts bathed by patient: Chest, Abdomen, Right upper leg, Left upper leg, Face   Body parts bathed by helper: Left lower leg, Right lower leg, Front perineal area, Buttocks, Right arm, Left arm     Bathing assist Assist Level: Maximal Assistance - Patient 24 - 49%     Upper Body Dressing/Undressing Upper body dressing   What is the patient wearing?: Pull over shirt    Upper body assist Assist Level: Moderate Assistance - Patient 50 - 74%  Lower Body Dressing/Undressing Lower body dressing      What is the patient wearing?: Pants     Lower body assist Assist for lower body dressing: Maximal Assistance - Patient 25 - 49%     Toileting Toileting    Toileting assist Assist for toileting: Total Assistance - Patient < 25%     Transfers Chair/bed transfer  Transfers assist     Chair/bed transfer assist level: Maximal Assistance - Patient 25 - 49%     Locomotion Ambulation   Ambulation assist      Assist level: Maximal Assistance - Patient 25 - 49%   Max distance: 2'   Walk 10 feet  activity   Assist  Walk 10 feet activity did not occur: Safety/medical concerns        Walk 50 feet activity   Assist Walk 50 feet with 2 turns activity did not occur: Safety/medical concerns         Walk 150 feet activity   Assist Walk 150 feet activity did not occur: Safety/medical concerns         Walk 10 feet on uneven surface  activity   Assist Walk 10 feet on uneven surfaces activity did not occur: Safety/medical concerns         Wheelchair     Assist Is the patient using a wheelchair?: Yes Type of Wheelchair: Manual    Wheelchair assist level: Supervision/Verbal cueing Max wheelchair distance: 150'    Wheelchair 50 feet with 2 turns activity    Assist        Assist Level: Supervision/Verbal cueing   Wheelchair 150 feet activity     Assist      Assist Level: Supervision/Verbal cueing   Blood pressure 126/86, pulse (!) 103, temperature 98.3 F (36.8 C), temperature source Oral, resp. rate 20, last menstrual period 01/16/2016, SpO2 98 %.  Medical Problem List and Plan: 1. 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  Antithrombotics: -DVT/anticoagulation:  Pharmaceutical: Heparin- will try and change to Lovenox- as long as labs in AM are OK -> HgB stable, Cr WNL, switch to Lovenox inj 40 mg daily              -antiplatelet therapy: N/A 3. Pain Management: Lidoderm patch, Tylenol as needed  - Reports chronic neck pain with Percocet 10 mg at home; was getting Oxy 5 mg Q6H PRN per admission notes; resume Oxy 5 mg Q6H PRN, will wean as tolerated. Cannot continue pain medication on discharge d/t documented history of snorting oxycodone.   - States she gets gabapentin + Lyrica for peripheral neuropathy at home; not currently bothersome; monitor  4. Mood/Behavior/Sleep:               -antipsychotic agents: Clonazepam 0.5 mg 3 times daily, melatonin 3 mg nightly  5. Neuropsych/cognition: This patient is capable of making decisions on her own behalf.  6. Skin/Wound Care Stage II L inner buttock pressure ulcer- from [place]; tracheostomy opening - will do foam dressing and monitor clinically -stop using Purewick- explained to pt. - Routine skin checks - Continue covering tracheostomy site; healing well, monitor  7. Fluids/Electrolytes/Nutrition: Routine in and outs with follow-up chemistries - Mild low K on admission  labs; added K dur 20 meq 1 tab BID; still low increase KCL to 71meq  - 1/15: K 3.3; poor PO with intermittent refusals; switch to K powder 40 meq BID for 3 days and re-test  - 1/16: DC K phos d/t NPO; labs pending - Ca elevated but stable, monitor - Low protein/albumin, adding Boost supplement TID for nutrition and wound healing  8.  Respiratory failure/tracheostomy.  Decannulated 1 week ago- no air leak.  Check oxygen saturations every shift- off O2- current sats 96-97% - Admission: Coarse cough with light green sputum- will check f/u CXR  - 1/11 CXR: Multifocal airspace consolidation throughout the left lung appears stable. R lung clear. Vitals stable on RA. Continue current management.    1/14- WBC up to 17- per #15  1/14: WBC downtrending; likely source UTI   9.  AKI.  Resolved.  Latest creatinine 0.42.  Follow-up chemistries - Cr 0.5 this AM; WNL; encourage fluids with energy expenditure and follow  1/14- labs in AM  1/15: Cr stable 0.4; on IVF 75/hr for 24 hours; encouraged patient to drink 6x 250 cc water cups today given increased hydration needs with infection  1/16: DC IVF, labs and vitals stable  10.  History of tobacco polysubstance use.  Provide counseling  11.  Prediabetes.  Hemoglobin A1c 6.1.  Blood sugar checks discontinued  12.  Dysphagia.  Dysphagia #2 thin liquids.  Follow-up speech therapy  1/16: NPO except sips with meds and ice  chips d/t concern for colitis, awaiting GI eval  13. Constipation- resolved Last recorded BM 2 d ago , will give glycerin supp, await KUB  1/14- had moderate stool burden- given SMOG enema last night- pt having bright red clots since then  1/15: Educated patient on intermittent refusals of bowel meds  14. BRBPR/Diearrhea/?colitis  1/14- Hb is actually up from 9.1 to 11- so think blood loss is negligible- however will make sure has labs in AM as well.   1/15: HgB 9.5; H/H repeat ordered in AM d/t likely hemoconcentrated with prior 11 and no further BRBPR   1/16: 2x liquis red mucusy stools overnight. KUB 1/14 with ?colitis. Patient made NPO, awaiting GI evaluation, appreciate input. Crp, CMP, and CBC w diff ordered.  Remains N/V with PO Zofran and IV compazine Prn available.   15. Leukocytosis - Uti +/- colitis  1/14- WBC up to 17k from 11k- will check U/A and Cx but if that's negative, think the significant pleural effusion/multiloculated cosolidation on L lung could be getting infected- if need be, wil call ID to discuss- cannot see if this was treatment in [facility] after 1/1 when WBC went up to [facility]  1/15: See above; likely UTI started Keflex 500 mg q8 hours and since not voiding a lot, will start NS IVFs 75cc hour for 1 day -> switched to Rocephin 1 G daily d/t poor PO tolerance, plan to switch back to PO once improved  1/16: Continue IV anx d/t NPO, N/V; awaiting sensitivities  17. Tachycardia - improved  1/14- has been elevated, but a little more this AM- could be due to leukocytosis- will have low threshold for procalcitonin/more w/u, however O2 sats 96-99%  1/15 - stable, no changes, continue IVF 1/16: DC IVF   LOS: 5 days A FACE TO FACE EVALUATION WAS PERFORMED  Gertie Gowda 02/11/2022, 9:09 AM

## 2022-02-11 NOTE — Progress Notes (Signed)
Call place to nurse: Checking on potassium run.  CT results was reviewed, I never received a call back with results. Page placed to Dr Loletha Carrow, awaiting a return call.

## 2022-02-12 ENCOUNTER — Encounter (HOSPITAL_COMMUNITY): Payer: Self-pay | Admitting: Physical Medicine and Rehabilitation

## 2022-02-12 DIAGNOSIS — R1030 Lower abdominal pain, unspecified: Secondary | ICD-10-CM | POA: Diagnosis not present

## 2022-02-12 DIAGNOSIS — R933 Abnormal findings on diagnostic imaging of other parts of digestive tract: Secondary | ICD-10-CM

## 2022-02-12 DIAGNOSIS — K625 Hemorrhage of anus and rectum: Secondary | ICD-10-CM | POA: Diagnosis not present

## 2022-02-12 DIAGNOSIS — D649 Anemia, unspecified: Secondary | ICD-10-CM | POA: Diagnosis not present

## 2022-02-12 LAB — COMPREHENSIVE METABOLIC PANEL
ALT: 18 U/L (ref 0–44)
AST: 14 U/L — ABNORMAL LOW (ref 15–41)
Albumin: 2.3 g/dL — ABNORMAL LOW (ref 3.5–5.0)
Alkaline Phosphatase: 124 U/L (ref 38–126)
Anion gap: 10 (ref 5–15)
BUN: <5 mg/dL — ABNORMAL LOW (ref 6–20)
CO2: 20 mmol/L — ABNORMAL LOW (ref 22–32)
Calcium: 10.4 mg/dL — ABNORMAL HIGH (ref 8.9–10.3)
Chloride: 106 mmol/L (ref 98–111)
Creatinine, Ser: 0.55 mg/dL (ref 0.44–1.00)
GFR, Estimated: >60 mL/min (ref >60)
Glucose, Bld: 76 mg/dL (ref 70–99)
Potassium: 3.3 mmol/L — ABNORMAL LOW (ref 3.5–5.1)
Sodium: 136 mmol/L (ref 135–145)
Total Bilirubin: 0.6 mg/dL (ref 0.3–1.2)
Total Protein: 7 g/dL (ref 6.5–8.1)

## 2022-02-12 LAB — IRON AND TIBC
Iron: 53 ug/dL (ref 28–170)
Saturation Ratios: 22 % (ref 10.4–31.8)
TIBC: 241 ug/dL — ABNORMAL LOW (ref 250–450)
UIBC: 188 ug/dL

## 2022-02-12 LAB — RETICULOCYTES
Immature Retic Fract: 16 % — ABNORMAL HIGH (ref 2.3–15.9)
RBC.: 3.05 MIL/uL — ABNORMAL LOW (ref 3.87–5.11)
Retic Count, Absolute: 128.4 10*3/uL (ref 19.0–186.0)
Retic Ct Pct: 4.2 % — ABNORMAL HIGH (ref 0.4–3.1)

## 2022-02-12 LAB — CBC
HCT: 29 % — ABNORMAL LOW (ref 36.0–46.0)
Hemoglobin: 9.3 g/dL — ABNORMAL LOW (ref 12.0–15.0)
MCH: 31.1 pg (ref 26.0–34.0)
MCHC: 32.1 g/dL (ref 30.0–36.0)
MCV: 97 fL (ref 80.0–100.0)
Platelets: 436 10*3/uL — ABNORMAL HIGH (ref 150–400)
RBC: 2.99 MIL/uL — ABNORMAL LOW (ref 3.87–5.11)
RDW: 14.8 % (ref 11.5–15.5)
WBC: 12.5 10*3/uL — ABNORMAL HIGH (ref 4.0–10.5)
nRBC: 0 % (ref 0.0–0.2)

## 2022-02-12 LAB — FERRITIN: Ferritin: 713 ng/mL — ABNORMAL HIGH (ref 11–307)

## 2022-02-12 LAB — C-REACTIVE PROTEIN: CRP: 2.8 mg/dL — ABNORMAL HIGH (ref <1.0)

## 2022-02-12 LAB — ACID FAST CULTURE WITH REFLEXED SENSITIVITIES (MYCOBACTERIA): Acid Fast Culture: NEGATIVE

## 2022-02-12 LAB — PHOSPHORUS: Phosphorus: 3.9 mg/dL (ref 2.5–4.6)

## 2022-02-12 LAB — LIPASE, BLOOD: Lipase: 69 U/L — ABNORMAL HIGH (ref 11–51)

## 2022-02-12 LAB — FOLATE: Folate: 35.7 ng/mL (ref >5.9)

## 2022-02-12 LAB — MIN INHIBITORY CONC (2 DRUGS)

## 2022-02-12 LAB — MAGNESIUM: Magnesium: 1.7 mg/dL (ref 1.7–2.4)

## 2022-02-12 LAB — VITAMIN B12: Vitamin B-12: 360 pg/mL (ref 180–914)

## 2022-02-12 MED ORDER — PEG-KCL-NACL-NASULF-NA ASC-C 100 G PO SOLR
1.0000 | Freq: Once | ORAL | Status: AC
  Administered 2022-02-12: 200 g via ORAL
  Filled 2022-02-12: qty 1

## 2022-02-12 MED ORDER — POTASSIUM CHLORIDE 20 MEQ PO PACK: 40.0000 meq | PACK | Freq: Two times a day (BID) | ORAL | Status: DC

## 2022-02-12 MED ORDER — POTASSIUM CHLORIDE CRYS ER 20 MEQ PO TBCR
40.0000 meq | EXTENDED_RELEASE_TABLET | Freq: Two times a day (BID) | ORAL | Status: DC
  Administered 2022-02-12 – 2022-02-23 (×17): 40 meq via ORAL
  Administered 2022-02-24: 20 meq via ORAL
  Administered 2022-02-25 (×2): 40 meq via ORAL
  Filled 2022-02-12 (×25): qty 2

## 2022-02-12 MED ORDER — POLYETHYLENE GLYCOL 3350 17 GM/SCOOP PO POWD
1.0000 | Freq: Once | ORAL | Status: AC
  Administered 2022-02-12: 255 g via ORAL
  Filled 2022-02-12: qty 255

## 2022-02-12 MED ORDER — POTASSIUM CHLORIDE 10 MEQ/100ML IV SOLN
10.0000 meq | INTRAVENOUS | Status: AC
  Administered 2022-02-12: 10 meq via INTRAVENOUS
  Filled 2022-02-12 (×8): qty 100

## 2022-02-12 MED ORDER — SODIUM CHLORIDE 0.9 % IV SOLN: INTRAVENOUS | Status: AC

## 2022-02-12 MED ORDER — POTASSIUM CHLORIDE 10 MEQ/100ML IV SOLN
10.0000 meq | INTRAVENOUS | Status: DC
  Administered 2022-02-12: 10 meq via INTRAVENOUS
  Filled 2022-02-12 (×6): qty 100

## 2022-02-12 NOTE — Progress Notes (Signed)
PROGRESS NOTE   Subjective/Complaints:  No events overnight. Feeling better this AM, nausea improving and having nonbloody Bms. Hungry today, asking for diet to be re instituted; GI ok with clear liquid diet with PREP today for colonoscopy vs. Flex sig +/- EGD tomorrow. Mild sputum production with cough this AM, but no SOB, oxygen requirements, or fevers/chills. Vitals stable.    ROS:   ROS: Denies fevers, chills, N/V, abdominal pain, constipation, diarrhea, SOB, chest pain, new weakness or paraesthesias.  +cough.   Objective:   CT ABDOMEN PELVIS W CONTRAST  Result Date: 02/11/2022 CLINICAL DATA:  Lower GI bleed EXAM: CT ABDOMEN AND PELVIS WITH CONTRAST TECHNIQUE: Multidetector CT imaging of the abdomen and pelvis was performed using the standard protocol following bolus administration of intravenous contrast. RADIATION DOSE REDUCTION: This exam was performed according to the departmental dose-optimization program which includes automated exposure control, adjustment of the mA and/or kV according to patient size and/or use of iterative reconstruction technique. CONTRAST:  3mL OMNIPAQUE IOHEXOL 350 MG/ML SOLN COMPARISON:  02/16/2013 FINDINGS: Lower chest: Consolidation in the left lower lobe peripherally. Cannot exclude pneumonia. Tree-in-bud nodular densities in the lower lobes bilaterally, right greater than left. No effusions. Hepatobiliary: 1.7 cm cyst in the right hepatic lobe. No suspicious focal hepatic abnormality. Gallbladder unremarkable. No biliary ductal dilatation. Pancreas: Mild stranding adjacent to pancreatic tail concerning for early focal acute pancreatitis. No ductal dilatation. Spleen: No focal abnormality.  Normal size. Adrenals/Urinary Tract: No adrenal abnormality. No focal renal abnormality. No stones or hydronephrosis. Urinary bladder is unremarkable. Stomach/Bowel: Diffuse wall thickening noted in the rectosigmoid  colon concerning for colitis. Normal appendix. Stomach and small bowel decompressed, unremarkable. Vascular/Lymphatic: Aortic atherosclerosis. No evidence of aneurysm or adenopathy. Reproductive: Uterus and adnexa unremarkable.  No mass. Other: No free fluid or free air. Musculoskeletal: No acute bony abnormality. IMPRESSION: Wall thickening within the rectosigmoid colon concerning for infectious or inflammatory colitis. Slight stranding adjacent to the pancreatic tail raising the possibility of acute focal pancreatitis. Left lower lobe airspace opacity could reflect atelectasis or pneumonia. Electronically Signed   By: Rolm Baptise M.D.   On: 02/11/2022 18:06   Recent Labs    02/10/22 0612 02/11/22 1059 02/12/22 0749  WBC 14.3*  --  12.5*  HGB 9.5* 9.7* 9.3*  HCT 31.0* 30.2* 29.0*  PLT 476*  --  436*    Recent Labs    02/11/22 1109 02/12/22 0749  NA 136 136  K 2.9* 3.3*  CL 104 106  CO2 20* 20*  GLUCOSE 79 76  BUN 8 <5*  CREATININE 0.55 0.55  CALCIUM 9.9 10.4*     Intake/Output Summary (Last 24 hours) at 02/12/2022 0931 Last data filed at 02/12/2022 0741 Gross per 24 hour  Intake 117.97 ml  Output --  Net 117.97 ml         Physical Exam: Vital Signs Blood pressure 115/67, pulse 98, temperature 98 F (36.7 C), resp. rate 17, last menstrual period 01/16/2016, SpO2 98 %.    General: awake, alert, appropriate, laying in bed; appearance improved;  NAD HENT: conjugate gaze; pale conjunctivae B/L, moist mucus membranes. Trach site pinpoint opening, clean.  CV: regular rhythm, regular  rate, no m.r.g; no JVD Pulmonary: Decreased breath sounds in lower L>R lung. Greyish mild sputum production in cup. CTAB.  GI: soft, nondistended; +hypoactive BS. No TTP, no rebound tenderness.  Psychiatric: appropriate, in better spirits Neurological: AAOx3. Appropriate. CN 2-12 intact.  Musculoskeletal: Moving all 4 limbs in bed. BL LE plantarflexion w/ tight heel  cords.   Assessment/Plan: 1. Functional deficits which require 3+ hours per day of interdisciplinary therapy in a comprehensive inpatient rehab setting. Physiatrist is providing close team supervision and 24 hour management of active medical problems listed below. Physiatrist and rehab team continue to assess barriers to discharge/monitor patient progress toward functional and medical goals  Care Tool:  Bathing    Body parts bathed by patient: Chest, Abdomen, Right upper leg, Left upper leg, Face   Body parts bathed by helper: Left lower leg, Right lower leg, Front perineal area, Buttocks, Right arm, Left arm     Bathing assist Assist Level: Maximal Assistance - Patient 24 - 49%     Upper Body Dressing/Undressing Upper body dressing   What is the patient wearing?: Pull over shirt    Upper body assist Assist Level: Moderate Assistance - Patient 50 - 74%    Lower Body Dressing/Undressing Lower body dressing      What is the patient wearing?: Pants     Lower body assist Assist for lower body dressing: Maximal Assistance - Patient 25 - 49%     Toileting Toileting    Toileting assist Assist for toileting: Total Assistance - Patient < 25%     Transfers Chair/bed transfer  Transfers assist     Chair/bed transfer assist level: Maximal Assistance - Patient 25 - 49%     Locomotion Ambulation   Ambulation assist      Assist level: Maximal Assistance - Patient 25 - 49%   Max distance: 2'   Walk 10 feet activity   Assist  Walk 10 feet activity did not occur: Safety/medical concerns        Walk 50 feet activity   Assist Walk 50 feet with 2 turns activity did not occur: Safety/medical concerns         Walk 150 feet activity   Assist Walk 150 feet activity did not occur: Safety/medical concerns         Walk 10 feet on uneven surface  activity   Assist Walk 10 feet on uneven surfaces activity did not occur: Safety/medical concerns          Wheelchair     Assist Is the patient using a wheelchair?: Yes Type of Wheelchair: Manual    Wheelchair assist level: Supervision/Verbal cueing Max wheelchair distance: 150'    Wheelchair 50 feet with 2 turns activity    Assist        Assist Level: Supervision/Verbal cueing   Wheelchair 150 feet activity     Assist      Assist Level: Supervision/Verbal cueing   Blood pressure 115/67, pulse 98, temperature 98 F (36.7 C), resp. rate 17, last menstrual period 01/16/2016, SpO2 98 %.  Medical Problem List and Plan: 1. 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  Antithrombotics: -DVT/anticoagulation:  Pharmaceutical: Heparin- will try and change to  Lovenox- as long as labs in AM are OK -> HgB stable, Cr WNL, switch to Lovenox inj 40 mg daily              -antiplatelet therapy: N/A 3. Pain Management: Lidoderm patch, Tylenol as needed  - Reports chronic neck pain with Percocet 10 mg at home; was getting Oxy 5 mg Q6H PRN per admission notes; resume Oxy 5 mg Q6H PRN, will wean as tolerated. Cannot continue pain medication on discharge d/t documented history of snorting oxycodone.   - States she gets gabapentin + Lyrica for peripheral neuropathy at home; not currently bothersome; monitor  4. Mood/Behavior/Sleep:              -antipsychotic agents: Clonazepam 0.5 mg 3 times daily, melatonin 3 mg nightly  5. Neuropsych/cognition: This patient is capable of making decisions on her own behalf.  6. Skin/Wound Care Stage II L inner buttock pressure ulcer- from [place]; tracheostomy opening - will do foam dressing and monitor clinically -stop using Purewick- explained to pt. - Routine skin checks - Continue covering tracheostomy site; healing well, monitor  7. Fluids/Electrolytes/Nutrition: Routine in and  outs with follow-up chemistries - Mild low K on admission labs; added K dur 20 meq 1 tab BID; still low increase KCL to  - 1/15: K 3.3; poor PO with intermittent refusals; switch to K powder 40 meq BID for 3 days and re-test  - 1/16: DC K phos d/t NPO; labs pending  - 1/17: Had 80 meq IV K overnight for 2.9; 3.3 this AM. Repeat IV 80 meq today with labs in AM. Phos and Mag WNL, no repletion needed. Will resume clear liquid diet today per GI and given maintenance fluids 75 cc/hr NS for 12 hours.   - Ca elevated but stable, monitor - Low protein/albumin, adding Boost supplement TID for nutrition and wound healing; change to ensure clear d/t clear liquid diet  8.  Respiratory failure/tracheostomy.  Decannulated 1 week ago- no air leak.  Check oxygen saturations every shift- off O2- current sats 96-97% - Admission: Coarse cough with light green sputum- will check f/u CXR  - 1/11 CXR: Multifocal airspace consolidation throughout the left lung appears stable. R lung clear. Vitals stable on RA. Continue current management.    1/14- WBC up to 17- per #15  1/14: WBC downtrending; likely source UTI  1/17: Afebrile, vitals stable, WBC continues downtrend on UTI Tx. Mild sputum production, complex pathology on CT abdomen but appears stable from prior images. Low suspicion for recurrent pneumonia, monitor closely   9.  AKI.  Resolved.  Latest creatinine 0.42.  Follow-up chemistries - Cr 0.5 this AM; WNL; encourage fluids with energy expenditure and follow  1/14- labs in AM  1/15: Cr stable 0.4; on IVF 75/hr for 24 hours; encouraged patient to drink 6x 250 cc water cups today given increased hydration needs with infection  1/16: DC IVF, labs and vitals stable  1/17: Resume IVF today as above; NPO yesterday; Cr stable  10.  History of tobacco polysubstance use.  Provide counseling  11.  Prediabetes.  Hemoglobin A1c 6.1.  Blood sugar checks discontinued  12.  Dysphagia.  Dysphagia #2 thin liquids.   Follow-up speech therapy  1/16: NPO except sips with meds and ice chips d/t concern for colitis, awaiting GI eval  1/17: Clear liquid diet today, NPO at midnight for scope.   13. Constipation- resolved Last recorded BM 2 d ago , will give glycerin supp, await KUB  1/14- had moderate stool burden- given SMOG enema last night- pt having bright red clots since then  1/15: Educated patient on intermittent refusals of bowel meds  1/17: Continue BID miralax 17 g per GI recs; bowel PREP today for scope in AM  14. BRBPR/Diearrhea/?colitis  1/14- Hb is actually up from 9.1 to 11- so think blood loss is negligible- however will make sure has labs in AM as well.   1/15: HgB 9.5; H/H repeat ordered in AM d/t likely hemoconcentrated with prior 11 and no further BRBPR   1/16: 2x liquis red mucusy stools overnight. KUB 1/14 with ?colitis. Patient made NPO, awaiting GI evaluation, appreciate input. Crp, CMP, and CBC w diff ordered.  Remains N/V with PO Zofran and IV compazine Prn available.   1/17: GI consulted, appreciate their expertise and input. CT abdomen overnight with results showing ?rectosigmoid colitis. Unlikely infectious. Cleared for clear liquid diet today, patient to have colonoscopy vs. Flex sigmoidoscopy in AM; +/- EGD depending on iron panel.   15. Leukocytosis - Uti +/- colitis - improving  1/14- WBC up to 17k from 11k- will check U/A and Cx but if that's negative, think the significant pleural effusion/multiloculated cosolidation on L lung could be getting infected- if need be, wil call ID to discuss- cannot see if this was treatment in [facility] after 1/1 when WBC went up to [facility]  1/15: See above; likely UTI started Keflex 500 mg q8 hours and since not voiding a lot, will start NS IVFs 75cc hour for 1 day -> switched to Rocephin 1 G daily d/t poor PO tolerance, plan to switch back to PO once improved  1/16: Continue IV abx d/t NPO, N/V; awaiting sensitivities  1/17: WBC improving; continue IV Abx  until post-scope and then advance to PO   17. Tachycardia - improved  1/14- has been elevated, but a little more this AM- could be due to leukocytosis- will have low threshold for procalcitonin/more w/u, however O2 sats 96-99%  1/15 - stable, no changes, continue IVF 1/16: DC IVF  1/17: Maintenance fluids 75 cc/hr NS for 12 hours given NPO yesterday, NPO tonight  LOS: 6 days A FACE TO FACE EVALUATION WAS PERFORMED  Angelina Sheriff 02/12/2022, 9:31 AM

## 2022-02-12 NOTE — Progress Notes (Signed)
Patient refused stating that she is not taking any more IV KCL and she will take the rest PO. Informed her of the risks and potential outcome, she adamantly refused. DO aware of potential refusal. Two bags administered of KCL.

## 2022-02-12 NOTE — Progress Notes (Signed)
Physical Therapy Session Note  Patient Details  Name: Janet Hensley MRN: [id number] Date of Birth: Dec 29, 1970  Today's Date: 02/12/2022 PT Individual Time: 4944-9675 PT Individual Time Calculation (min): 45 min   Short Term Goals: Week 1:  PT Short Term Goal 1 (Week 1): Pt will complete bed mobility with minA consistently. PT Short Term Goal 2 (Week 1): Pt will complete bed to chair transfer consistently PT Short Term Goal 3 (Week 1): Pt will ambulate x15' with modA and LRAD.  Skilled Therapeutic Interventions/Progress Updates:   Chart reviewed and pt agreeable to therapy. Pt received semi-reclined in bed with no c/o pain. Also of note, pt stated she had just gotten into resting position after prior session and requested to eat before PT session. Thus, pt session began late. Session focused on functional transfers and pre-gait activities to promote home mobility. Pt initiated session with transfer to EOB using MinA + bed features. Pt then completed sit to stand with ModA + STEDY. Pt dependently transferred to [facility]. Pt then completed repeated sit to stand in STEDY with ModA progressing to MinA with VC for sequencing and technique. In standing, pt completed 1x8 lateral weight shifts during first stand followed by 1x2 mini marches in second trails, and then 1x6 mini-marches in consecutive trials, displaying increased endurance for pre-gait activity. Pt then stated she felt HR to be high, so HR assessed and found to be 130 bpm decreasing to 120bmp with  2 min rest. Pt then seated in WC and verbally reviewed standing technique with PT. At end of session, pt was left seated in [facility] with alarm engaged, nurse call bell and all needs in reach.     Therapy Documentation Precautions:  Precautions Precautions: Fall Restrictions Weight Bearing Restrictions: No    Therapy/Group: Individual Therapy  Marquette Old, PT, DPT 02/12/2022, 11:57 AM

## 2022-02-12 NOTE — Progress Notes (Signed)
KCL running at 20 cc per hour at this time. Patient stating it was burning. PA Love recommenced adjusting down from 30 cc. Patient tolerating well at this time as it continues to run with NS.

## 2022-02-12 NOTE — Progress Notes (Addendum)
Patient has multiple bags of Potassium CL IV ordered, states she refuses. She states she received "many bags yesterday" and it "burned". Informed DO via secure chat. MD replied and informed why patient refused. Patient stated, "I will leave the hospital before I take that again."

## 2022-02-12 NOTE — Progress Notes (Signed)
Patient in agreement to start IV Potassium at this time. States she will not continue with infusion if it burns. Agreeable to try three bags with PO combination (at this time). Informed DO Engler.

## 2022-02-12 NOTE — Progress Notes (Signed)
    Progress Note   Subjective  Patient feeling okay today.  Had a bowel movement yesterday without blood.  She still has some left lower quadrant discomfort.  No epigastric pain.  No vomiting.  She had a CT scan done overnight.   Objective   Vital signs in last 24 hours: Temp:  [97.9 F (36.6 C)-98.1 F (36.7 C)] 98 F (36.7 C) (01/17 0439) Pulse Rate:  [94-98] 98 (01/17 0439) Resp:  [17-18] 17 (01/17 0439) BP: (115-139)/(67-77) 115/67 (01/17 0439) SpO2:  [98 %-100 %] 98 % (01/17 0439) Last BM Date : 02/11/22 General:    white female in NAD Abdomen:  Soft, some lower abdominal TTP, nondistended.  Neurologic:  Alert and oriented,  grossly normal neurologically. Psych:  Cooperative. Normal mood and affect.  Intake/Output from previous day: 01/16 0701 - 01/17 0700 In: 118 [IV Piggyback:118] Out: -  Intake/Output this shift: No intake/output data recorded.  Lab Results: Recent Labs    02/09/22 0943 02/10/22 0612 02/11/22 1059 02/12/22 0749  WBC 17.1* 14.3*  --  12.5*  HGB 11.7* 9.5* 9.7* 9.3*  HCT 35.2* 31.0* 30.2* 29.0*  PLT 419* 476*  --  436*   BMET Recent Labs    02/10/22 0612 02/11/22 1109  NA 135 136  K 3.3* 2.9*  CL 106 104  CO2 22 20*  GLUCOSE 111* 79  BUN 13 8  CREATININE 0.46 0.55  CALCIUM 10.4* 9.9   LFT Recent Labs    02/11/22 1109  PROT 6.8  ALBUMIN 2.3*  AST 18  ALT 18  ALKPHOS 125  BILITOT 0.3   PT/INR No results for input(s): "LABPROT", "INR" in the last 72 hours.  Studies/Results: CT ABDOMEN PELVIS W CONTRAST  Result Date: 02/11/2022 CLINICAL DATA:  Lower GI bleed EXAM: CT ABDOMEN AND PELVIS WITH CONTRAST TECHNIQUE: Multidetector CT imaging of the abdomen and pelvis was performed using the standard protocol following bolus administration of intravenous contrast. RADIATION DOSE REDUCTION: This exam was performed according to the departmental dose-optimization program which includes automated exposure control, adjustment of the  mA and/or kV according to patient size and/or use of iterative reconstruction technique. CONTRAST:  75mL OMNIPAQUE IOHEXOL 350 MG/ML SOLN COMPARISON:  02/16/2013 FINDINGS: Lower chest: Consolidation in the left lower lobe peripherally. Cannot exclude pneumonia. Tree-in-bud nodular densities in the lower lobes bilaterally, right greater than left. No effusions. Hepatobiliary: 1.7 cm cyst in the right hepatic lobe. No suspicious focal hepatic abnormality. Gallbladder unremarkable. No biliary ductal dilatation. Pancreas: Mild stranding adjacent to pancreatic tail concerning for early focal acute pancreatitis. No ductal dilatation. Spleen: No focal abnormality.  Normal size. Adrenals/Urinary Tract: No adrenal abnormality. No focal renal abnormality. No stones or hydronephrosis. Urinary bladder is unremarkable. Stomach/Bowel: Diffuse wall thickening noted in the rectosigmoid colon concerning for colitis. Normal appendix. Stomach and small bowel decompressed, unremarkable. Vascular/Lymphatic: Aortic atherosclerosis. No evidence of aneurysm or adenopathy. Reproductive: Uterus and adnexa unremarkable.  No mass. Other: No free fluid or free air. Musculoskeletal: No acute bony abnormality. IMPRESSION: Wall thickening within the rectosigmoid colon concerning for infectious or inflammatory colitis. Slight stranding adjacent to the pancreatic tail raising the possibility of acute focal pancreatitis. Left lower lobe airspace opacity could reflect atelectasis or pneumonia. Electronically Signed   By: Kevin  Dover M.D.   On: 02/11/2022 18:06       Assessment / Plan:    52 y/o female with the following issues:  Lower abdominal pain Rectal bleeding Anemia Abnormal CT scan  See   yesterday's consult note for full details of her case.  CT scan performed showing inflammatory changes in the rectosigmoid colon consistent with colitis.  This is likely the cause of her pain and her bleeding.  Discussed differential with her.   Infectious colitis would seem unlikely, she has had no diarrhea.  Stercoral ulceration from constipation possible, versus ischemic colitis, versus IBD.  She has never had a colonoscopy.  If she can tolerate a bowel prep I think colonoscopy would be in her best interest to further evaluate this.  If she does not think she can tolerate a bowel prep, could do limited exam with flex sig to at least evaluate the area of concern.  I discussed colonoscopy with her, risk benefits the exam and anesthesia, she wants to proceed.  Will give a slow MiraLAX bowel prep today followed by movie prep tonight.  Again if she cannot tolerate this then we will plan for limited exam with flex sig and enemas tomorrow.  Of note, question of pancreatitis on CT scan but not definitive.  She has no epigastric pain.  She has had some vomiting recently.  Will send lipase to correlate.  If she does have pancreatitis unclear etiology, LFTs had looked okay.  Clear liquid diet okay while she is doing prep today.  Awaiting anemia workup otherwise with serologies.  If she has iron deficiency may add on EGD as well.  PLAN: - tentative plans for colonoscopy tomorrow if she can tolerate bowel prep. If she can't tolerate prep, then flex sig - slow miralax prep today, Moviprep tonight - clear liquid diet today - await serologic workup for anemia - if IDA, will add on EGD - send lipase to assess for pancreatitis  Will follow, call with questions.  Jolly Mango, MD [facility]

## 2022-02-12 NOTE — Progress Notes (Signed)
2nd bag of KCL hung at 1740. Patient tolerating 10 cc increase at this time, continuing with NS.

## 2022-02-12 NOTE — Progress Notes (Signed)
Physical Therapy Session Note  Patient Details  Name: Janet Hensley MRN: [id number] Date of Birth: October 06, 1970  Today's Date: 02/12/2022 PT Individual Time: 5102-5852 PT Individual Time Calculation (min): 72 min   Short Term Goals: Week 1:  PT Short Term Goal 1 (Week 1): Pt will complete bed mobility with minA consistently. PT Short Term Goal 2 (Week 1): Pt will complete bed to chair transfer consistently PT Short Term Goal 3 (Week 1): Pt will ambulate x15' with modA and LRAD.  Skilled Therapeutic Interventions/Progress Updates:     Pt received semi reclined and agrees to therapy. No complaint of pain but does report significant fatigue from earlier sessions as well as diarrhea. Pt performs supine to sit with minA and bed features, with cues for sequencing and positioning at EOB. Pt attempts sit to stand from edge of bed with Stedy, but is unable to complete full transfer, requiring seated rest break. Bed raised several inches and pt performs second attempt, standing with modA to facilitate anterior weight shift and powering up. PT assists to pull pants up over hips in standing prior to pt taking rest break in high perched position. Transfer to [facility] with [place] and Stedy. Pt self propels WC 150' with bilateral upper extremities to work on endurance and mobility training. Squat pivot transfer to Nustep with modA and cues for sequencing and positioning. Pt completes Nustep for 15:00 at workload of 3 with average steps per minute ~45. PT provides cues for hand and foot placement as well as completing full available ROM. Pt performs squat pivot from Nustep>WC>mat table with modA and same cues. From mat in short sitting, pt performs sit to squat transitions to work on functional mobility, body mechanics, balance, and strengthening. PT provides manual facilitation of anterior weight shift and cues for initiation. Squat pivot from mat>WC>bed with modA and same cues. ModA for sit to supine with management of  bilateral lower extremities. Left supine with all needs within reach.  Therapy Documentation Precautions:  Precautions Precautions: Fall Restrictions Weight Bearing Restrictions: No   Therapy/Group: Individual Therapy  Breck Coons, PT, DPT 02/12/2022, 5:15 PM

## 2022-02-12 NOTE — H&P (View-Only) (Signed)
Progress Note   Subjective  Patient feeling okay today.  Had a bowel movement yesterday without blood.  She still has some left lower quadrant discomfort.  No epigastric pain.  No vomiting.  She had a CT scan done overnight.   Objective   Vital signs in last 24 hours: Temp:  [97.9 F (36.6 C)-98.1 F (36.7 C)] 98 F (36.7 C) (01/17 0439) Pulse Rate:  [94-98] 98 (01/17 0439) Resp:  [17-18] 17 (01/17 0439) BP: (115-139)/(67-77) 115/67 (01/17 0439) SpO2:  [98 %-100 %] 98 % (01/17 0439) Last BM Date : 02/11/22 General:    white female in NAD Abdomen:  Soft, some lower abdominal TTP, nondistended.  Neurologic:  Alert and oriented,  grossly normal neurologically. Psych:  Cooperative. Normal mood and affect.  Intake/Output from previous day: 01/16 0701 - 01/17 0700 In: 118 [IV Piggyback:118] Out: -  Intake/Output this shift: No intake/output data recorded.  Lab Results: Recent Labs    02/09/22 0943 02/10/22 0612 02/11/22 1059 02/12/22 0749  WBC 17.1* 14.3*  --  12.5*  HGB 11.7* 9.5* 9.7* 9.3*  HCT 35.2* 31.0* 30.2* 29.0*  PLT 419* 476*  --  436*   BMET Recent Labs    02/10/22 0612 02/11/22 1109  NA 135 136  K 3.3* 2.9*  CL 106 104  CO2 22 20*  GLUCOSE 111* 79  BUN 13 8  CREATININE 0.46 0.55  CALCIUM 10.4* 9.9   LFT Recent Labs    02/11/22 1109  PROT 6.8  ALBUMIN 2.3*  AST 18  ALT 18  ALKPHOS 125  BILITOT 0.3   PT/INR No results for input(s): "LABPROT", "INR" in the last 72 hours.  Studies/Results: CT ABDOMEN PELVIS W CONTRAST  Result Date: 02/11/2022 CLINICAL DATA:  Lower GI bleed EXAM: CT ABDOMEN AND PELVIS WITH CONTRAST TECHNIQUE: Multidetector CT imaging of the abdomen and pelvis was performed using the standard protocol following bolus administration of intravenous contrast. RADIATION DOSE REDUCTION: This exam was performed according to the departmental dose-optimization program which includes automated exposure control, adjustment of the  mA and/or kV according to patient size and/or use of iterative reconstruction technique. CONTRAST:  77mL OMNIPAQUE IOHEXOL 350 MG/ML SOLN COMPARISON:  02/16/2013 FINDINGS: Lower chest: Consolidation in the left lower lobe peripherally. Cannot exclude pneumonia. Tree-in-bud nodular densities in the lower lobes bilaterally, right greater than left. No effusions. Hepatobiliary: 1.7 cm cyst in the right hepatic lobe. No suspicious focal hepatic abnormality. Gallbladder unremarkable. No biliary ductal dilatation. Pancreas: Mild stranding adjacent to pancreatic tail concerning for early focal acute pancreatitis. No ductal dilatation. Spleen: No focal abnormality.  Normal size. Adrenals/Urinary Tract: No adrenal abnormality. No focal renal abnormality. No stones or hydronephrosis. Urinary bladder is unremarkable. Stomach/Bowel: Diffuse wall thickening noted in the rectosigmoid colon concerning for colitis. Normal appendix. Stomach and small bowel decompressed, unremarkable. Vascular/Lymphatic: Aortic atherosclerosis. No evidence of aneurysm or adenopathy. Reproductive: Uterus and adnexa unremarkable.  No mass. Other: No free fluid or free air. Musculoskeletal: No acute bony abnormality. IMPRESSION: Wall thickening within the rectosigmoid colon concerning for infectious or inflammatory colitis. Slight stranding adjacent to the pancreatic tail raising the possibility of acute focal pancreatitis. Left lower lobe airspace opacity could reflect atelectasis or pneumonia. Electronically Signed   By: Rolm Baptise M.D.   On: 02/11/2022 18:06       Assessment / Plan:    52 y/o female with the following issues:  Lower abdominal pain Rectal bleeding Anemia Abnormal CT scan  See  yesterday's consult note for full details of her case.  CT scan performed showing inflammatory changes in the rectosigmoid colon consistent with colitis.  This is likely the cause of her pain and her bleeding.  Discussed differential with her.   Infectious colitis would seem unlikely, she has had no diarrhea.  Stercoral ulceration from constipation possible, versus ischemic colitis, versus IBD.  She has never had a colonoscopy.  If she can tolerate a bowel prep I think colonoscopy would be in her best interest to further evaluate this.  If she does not think she can tolerate a bowel prep, could do limited exam with flex sig to at least evaluate the area of concern.  I discussed colonoscopy with her, risk benefits the exam and anesthesia, she wants to proceed.  Will give a slow MiraLAX bowel prep today followed by movie prep tonight.  Again if she cannot tolerate this then we will plan for limited exam with flex sig and enemas tomorrow.  Of note, question of pancreatitis on CT scan but not definitive.  She has no epigastric pain.  She has had some vomiting recently.  Will send lipase to correlate.  If she does have pancreatitis unclear etiology, LFTs had looked okay.  Clear liquid diet okay while she is doing prep today.  Awaiting anemia workup otherwise with serologies.  If she has iron deficiency may add on EGD as well.  PLAN: - tentative plans for colonoscopy tomorrow if she can tolerate bowel prep. If she can't tolerate prep, then flex sig - slow miralax prep today, Moviprep tonight - clear liquid diet today - await serologic workup for anemia - if IDA, will add on EGD - send lipase to assess for pancreatitis  Will follow, call with questions.  Jolly Mango, MD [facility]

## 2022-02-12 NOTE — Progress Notes (Signed)
Occupational Therapy Session Note  Patient Details  Name: Janet Hensley MRN: [id number] Date of Birth: [birth year]  Today's Date: 02/12/2022 OT Individual Time: 1045-1200 OT Individual Time Calculation (min): 75 min    Short Term Goals: Week 1:  OT Short Term Goal 1 (Week 1): Patient will complete toilet transfer with mod A  of 1 OT Short Term Goal 2 (Week 1): Patient will complete 1 step of toileting task OT Short Term Goal 3 (Week 1): Patient will stand at the sink with mod A of 1 in preparation for BADL Task.  Skilled Therapeutic Interventions/Progress Updates:    Pt resting in w/c upon ariival. OT intervention with focus on BUE AROM and strengthening, toilet tranfsers, toileting, and activity tolerance to increase independence with BADLs. Yellow theraband BUE therex-chest pulls, diagonals 3x10. Pt attempted stratight arm raises with 2# barbell but unable. Stratight arm rasises 3x10 with assist. Pt requested to use bathroom. W/c into bathroom and mod A+2 (equipment) to [facility] using grab bar. Pt required use of Stedy to return to room and bed. Max A for toileting. HR at bed level 122. Pt remained in bed with all needs within reach and bed alarm activated.   Therapy Documentation Precautions:  Precautions Precautions: Fall Restrictions Weight Bearing Restrictions: No   Pain:  Pt denies pain this morning    Therapy/Group: Individual Therapy  Leroy Libman 02/12/2022, 12:06 PM

## 2022-02-12 NOTE — Anesthesia Preprocedure Evaluation (Addendum)
Anesthesia Evaluation  Patient identified by MRN, date of birth, ID band Patient awake    Reviewed: Allergy & Precautions, NPO status , Patient's Chart, lab work & pertinent test results  Airway Mallampati: II  TM Distance: >3 FB Neck ROM: Full    Dental no notable dental hx. (+) Teeth Intact, Dental Advisory Given   Pulmonary asthma , pneumonia, unresolved, Current SmokerPatient did not abstain from smoking. S/P tracheostomy   Pulmonary exam normal breath sounds clear to auscultation       Cardiovascular + CAD and + Past MI  Normal cardiovascular exam Rhythm:Regular Rate:Normal  20% CAD LVEF 55-60% STEMI 12/24/21   Neuro/Psych  Neuromuscular disease  negative psych ROS   GI/Hepatic Abnormal CT scan of colon   Endo/Other    Renal/GU ARFRenal disease  negative genitourinary   Musculoskeletal Myopathy of chronic illness   Abdominal   Peds  Hematology  (+) Blood dyscrasia, anemia   Anesthesia Other Findings   Reproductive/Obstetrics                              Anesthesia Physical Anesthesia Plan  ASA: 3  Anesthesia Plan: MAC   Post-op Pain Management: Minimal or no pain anticipated   Induction: Intravenous  PONV Risk Score and Plan: 1 and Propofol infusion and Treatment may vary due to age or medical condition  Airway Management Planned: Natural Airway and Nasal Cannula  Additional Equipment: None  Intra-op Plan:   Post-operative Plan:   Informed Consent: I have reviewed the patients History and Physical, chart, labs and discussed the procedure including the risks, benefits and alternatives for the proposed anesthesia with the patient or authorized representative who has indicated his/her understanding and acceptance.     Dental advisory given  Plan Discussed with: CRNA and Anesthesiologist  Anesthesia Plan Comments:          Anesthesia Quick Evaluation

## 2022-02-12 NOTE — Progress Notes (Signed)
Speech Language Pathology Daily Session Note  Patient Details  Name: Ilse C Rendell MRN: [id number] Date of Birth: Jan 19, 1971  Today's Date: 02/12/2022 SLP Individual Time: 2694-8546 SLP Individual Time Calculation (min): 40 min  Short Term Goals: Week 1: SLP Short Term Goal 1 (Week 1): Patient will consume current diet with minimal overt s/s of aspiration with Mod I for use of swallowing compensatory strategies. SLP Short Term Goal 2 (Week 1): Patient will demonstrate efficient mastication with complete oral clearance with trials of regular textures without overt s/s of aspiration with Mod I over 2 sessions prior to upgrade. SLP Short Term Goal 3 (Week 1): Patient will demonstrate functional problem solving for mildly complex tasks with Min verbal cues. SLP Short Term Goal 4 (Week 1): Patient will recall new, daily information with Min verbal and visual cues for use of compensatory strategies.  Skilled Therapeutic Interventions: Skilled treatment session focused on cognitive goals. Upon arrival, patient was awake in bed and agreeable to treatment session. At beginning of session, the GI physician arrived and discussed CT results and plan of care. Patient verbalized understanding and was able to recall pertinent information after a 15 minute delay. SLP also facilitated session by providing extra time and overall Min verbal cues for complex problem solving during a scheduling task. Due to time constraints, the task was not completed and will be during next session. Patient left upright in bed with alarm on and all needs within reach. Continue with current plan of care.      Pain No/Denies Pain   Therapy/Group: Individual Therapy  Ramal Eckhardt 02/12/2022, 1:28 PM

## 2022-02-13 ENCOUNTER — Inpatient Hospital Stay (HOSPITAL_COMMUNITY): Payer: 59 | Admitting: Anesthesiology

## 2022-02-13 ENCOUNTER — Encounter (HOSPITAL_COMMUNITY)
Admission: RE | Disposition: A | Discharge status: Home / Self Care | Payer: Self-pay | Attending: Physical Medicine and Rehabilitation

## 2022-02-13 DIAGNOSIS — F17210 Nicotine dependence, cigarettes, uncomplicated: Secondary | ICD-10-CM

## 2022-02-13 DIAGNOSIS — K648 Other hemorrhoids: Secondary | ICD-10-CM

## 2022-02-13 DIAGNOSIS — R1030 Lower abdominal pain, unspecified: Secondary | ICD-10-CM | POA: Diagnosis not present

## 2022-02-13 DIAGNOSIS — R933 Abnormal findings on diagnostic imaging of other parts of digestive tract: Secondary | ICD-10-CM

## 2022-02-13 DIAGNOSIS — K529 Noninfective gastroenteritis and colitis, unspecified: Secondary | ICD-10-CM

## 2022-02-13 DIAGNOSIS — J189 Pneumonia, unspecified organism: Secondary | ICD-10-CM

## 2022-02-13 DIAGNOSIS — I2510 Atherosclerotic heart disease of native coronary artery without angina pectoris: Secondary | ICD-10-CM

## 2022-02-13 HISTORY — PX: FLEXIBLE SIGMOIDOSCOPY: SHX5431

## 2022-02-13 HISTORY — PX: BIOPSY: SHX5522

## 2022-02-13 LAB — BASIC METABOLIC PANEL
Anion gap: 9 (ref 5–15)
BUN: <5 mg/dL — ABNORMAL LOW (ref 6–20)
CO2: 20 mmol/L — ABNORMAL LOW (ref 22–32)
Calcium: 10.1 mg/dL (ref 8.9–10.3)
Chloride: 105 mmol/L (ref 98–111)
Creatinine, Ser: 0.6 mg/dL (ref 0.44–1.00)
GFR, Estimated: >60 mL/min (ref >60)
Glucose, Bld: 81 mg/dL (ref 70–99)
Potassium: 3.6 mmol/L (ref 3.5–5.1)
Sodium: 134 mmol/L — ABNORMAL LOW (ref 135–145)

## 2022-02-13 SURGERY — SIGMOIDOSCOPY, FLEXIBLE
Anesthesia: Monitor Anesthesia Care

## 2022-02-13 MED ORDER — FLEET ENEMA 7-19 GM/118ML RE ENEM
2.0000 | ENEMA | Freq: Once | RECTAL | Status: AC
  Administered 2022-02-13: 2 via RECTAL

## 2022-02-13 MED ORDER — FLEET ENEMA 7-19 GM/118ML RE ENEM
ENEMA | RECTAL | Status: AC
  Filled 2022-02-13: qty 2

## 2022-02-13 MED ORDER — LIDOCAINE 5 % EX PTCH
2.0000 | MEDICATED_PATCH | CUTANEOUS | Status: DC
  Filled 2022-02-13 (×4): qty 2

## 2022-02-13 MED ORDER — OXYCODONE HCL 5 MG PO TABS
5.0000 mg | ORAL_TABLET | Freq: Once | ORAL | Status: AC | PRN
  Administered 2022-02-13: 5 mg via ORAL
  Filled 2022-02-13: qty 1

## 2022-02-13 MED ORDER — DICLOFENAC SODIUM 1 % EX GEL
2.0000 g | Freq: Two times a day (BID) | CUTANEOUS | Status: DC
  Administered 2022-02-14 – 2022-02-20 (×7): 2 g via TOPICAL
  Filled 2022-02-13 (×2): qty 100

## 2022-02-13 MED ORDER — LACTATED RINGERS IV SOLN: INTRAVENOUS | Status: DC

## 2022-02-13 MED ORDER — PROPOFOL 500 MG/50ML IV EMUL
INTRAVENOUS | Status: DC | PRN
  Administered 2022-02-13: 150 ug/kg/min via INTRAVENOUS

## 2022-02-13 SURGICAL SUPPLY — 22 items

## 2022-02-13 NOTE — Progress Notes (Signed)
Pt unable to finish bowel prep, less than half of prep ingested. Currently having yellow brown liquid stools.

## 2022-02-13 NOTE — Progress Notes (Signed)
Fleets enema given x2 at bedside.  Pt unable to ambulate to bathroom.  Pt tolerated well.

## 2022-02-13 NOTE — Progress Notes (Signed)
Occupational Therapy Session Note  Patient Details  Name: Janet Hensley MRN: [id number] Date of Birth: 03-01-1970  Today's Date: 02/13/2022 OT Individual Time: 1303-1330 OT Individual Time Calculation (min): 27 min    Short Term Goals: Week 1:  OT Short Term Goal 1 (Week 1): Patient will complete toilet transfer with mod A  of 1 OT Short Term Goal 2 (Week 1): Patient will complete 1 step of toileting task OT Short Term Goal 3 (Week 1): Patient will stand at the sink with mod A of 1 in preparation for BADL Task.  Skilled Therapeutic Interventions/Progress Updates:    Pt greeted semi-reclined in bed after returning to the unit from colonoscopy. Rec therapist also in room discussed leisure activities. Pt agreeable to get up to the wc with encouragement. Pt completed bed mobility with increased time and mod A. Stedy transfer with max A +2 to get into full hip and trunk extension. Stedy transfer to th wc. Pt able to reach to brush most of her hair, but needed OT assist to get the back. OT then assisted with  braiding hair and pt handoff to SLP for next therapy session.    Therapy Documentation Precautions:  Precautions Precautions: Fall Restrictions Weight Bearing Restrictions: No Pain: Pain Assessment Pain Scale: 0-10 Pain Score: 0-No pain   Therapy/Group: Individual Therapy  Valma Cava 02/13/2022, 2:09 PM

## 2022-02-13 NOTE — Op Note (Signed)
[facility] Patient Name: Janet Hensley Procedure Date : 02/13/2022 MRN: [id number] Attending MD: Carlota Raspberry. Havery Moros , MD, [id number] Date of Birth: Jan 28, 1970 CSN: [id number] Age: 52 Admit Type: Inpatient Procedure:                Flexible Sigmoidoscopy Indications:              Lower abdominal pain, Hematochezia, Abnormal CT of                            the GI tract showing thickening of rectosigmoid                            colon - occured in the setting of constipation.                            Anemia. Iron studies normal. Providers:                Carlota Raspberry. Havery Moros, MD, Jaci Carrel, RN,                            Benetta Spar, Technician Referring MD:              Medicines:                Monitored Anesthesia Care Complications:            No immediate complications. Estimated blood loss:                            Minimal. Estimated Blood Loss:     Estimated blood loss was minimal. Procedure:                Pre-Anesthesia Assessment:                           - Prior to the procedure, a History and Physical                            was performed, and patient medications and                            allergies were reviewed. The patient's tolerance of                            previous anesthesia was also reviewed. The risks                            and benefits of the procedure and the sedation                            options and risks were discussed with the patient.                            All questions were answered, and informed consent  was obtained. Prior Anticoagulants: The patient has                            taken no anticoagulant or antiplatelet agents. ASA                            Grade Assessment: III - A patient with severe                            systemic disease. After reviewing the risks and                            benefits, the patient was deemed in satisfactory                             condition to undergo the procedure.                           After obtaining informed consent, the scope was                            passed under direct vision. The PCF-HQ190TL                            ([id number] Olympus peds colonoscope was introduced                            through the anus and advanced to the the left                            transverse colon. The flexible sigmoidoscopy was                            accomplished without difficulty. The patient                            tolerated the procedure well. The quality of the                            bowel preparation was fair. Scope In: 12:00:54 PM Scope Out: 12:08:38 PM Total Procedure Duration: 0 hours 7 minutes 44 seconds  Findings:      The perianal and digital rectal examinations were normal.      The rectum, descending colon, mid transverse colon and distal transverse       colon appeared normal.      Patchy mild inflammation characterized by erosions, erythema and       granularity was found in the recto-sigmoid colon and in the distal       sigmoid colon. Biopsies were taken with a cold forceps for histology.      Internal hemorrhoids were found during retroflexion. The hemorrhoids       were small.      The exam was otherwise without abnormality. Prep was fair - small or       flat polyps may not have been appreciated on this exam.  Impression:               - Preparation of the colon was fair.                           - The rectum, descending colon, mid transverse                            colon and distal transverse colon are normal.                           - Patchy mild inflammation was found in the                            recto-sigmoid colon and in the distal sigmoid                            colon. Biopsied.                           - Internal hemorrhoids.                           - The examination was otherwise normal.                           Findings not overtly typical for  ischemic colitis,                            but will assess for that with biopsies. She had no                            diarrhea / loose stools, infectious also seems less                            likely. Biopsies taken to rule out IBD. Recommendation:           - Return patient to hospital ward for ongoing care.                           - Advance diet as tolerated.                           - Continue present medications.                           - Miralax twice daily                           - Await pathology results.                           - Full colonoscopy recommended as outpatient                           - We will reassess her tomorrow Procedure Code(s):        ---  Professional ---                           [phone], Sigmoidoscopy, flexible; with biopsy, single                            or multiple Diagnosis Code(s):        --- Professional ---                           K64.8, Other hemorrhoids                           K52.9, Noninfective gastroenteritis and colitis,                            unspecified                           R10.30, Lower abdominal pain, unspecified                           K92.1, Melena (includes Hematochezia)                           R93.3, Abnormal findings on diagnostic imaging of                            other parts of digestive tract CPT copyright 2022 [employer]. All rights reserved. The codes documented in this report are preliminary and upon coder review may  be revised to meet current compliance requirements. Remo Lipps P. Kinleigh Nault, MD 02/13/2022 12:15:40 PM This report has been signed electronically. Number of Addenda: 0

## 2022-02-13 NOTE — Progress Notes (Signed)
Occupational Therapy Note  Patient Details  Name: Janet Hensley MRN: [id number] Date of Birth: 1970/07/10  Today's Date: 02/13/2022 OT Missed Time: 75 Minutes Missed Time Reason: Out of hospital appointment (Pt off the unit for colonoscopy)  Pt off the unit for procedure;Colonoscopy. OT will follow up per plan of care.    [name] Shailene Demonbreun 02/13/2022, 11:53 AM

## 2022-02-13 NOTE — Progress Notes (Addendum)
PROGRESS NOTE   Subjective/Complaints:  Patient off of unit for colonoscopy this AM. No events overnight, did have trouble tolerating bowel prep with some vomitting. Refused IV KCL d/t burning after 20 meq IV, received 40 meq BID oral with K improved this AM from 3.3 to 3.6. Other labs appear stable.   Per GI, cleared to resume PO diet on return to IPR.    ROS:  Unable to obtain d/t off unit  Objective:   CT ABDOMEN PELVIS W CONTRAST  Result Date: 02/11/2022 CLINICAL DATA:  Lower GI bleed EXAM: CT ABDOMEN AND PELVIS WITH CONTRAST TECHNIQUE: Multidetector CT imaging of the abdomen and pelvis was performed using the standard protocol following bolus administration of intravenous contrast. RADIATION DOSE REDUCTION: This exam was performed according to the departmental dose-optimization program which includes automated exposure control, adjustment of the mA and/or kV according to patient size and/or use of iterative reconstruction technique. CONTRAST:  35mL OMNIPAQUE IOHEXOL 350 MG/ML SOLN COMPARISON:  02/16/2013 FINDINGS: Lower chest: Consolidation in the left lower lobe peripherally. Cannot exclude pneumonia. Tree-in-bud nodular densities in the lower lobes bilaterally, right greater than left. No effusions. Hepatobiliary: 1.7 cm cyst in the right hepatic lobe. No suspicious focal hepatic abnormality. Gallbladder unremarkable. No biliary ductal dilatation. Pancreas: Mild stranding adjacent to pancreatic tail concerning for early focal acute pancreatitis. No ductal dilatation. Spleen: No focal abnormality.  Normal size. Adrenals/Urinary Tract: No adrenal abnormality. No focal renal abnormality. No stones or hydronephrosis. Urinary bladder is unremarkable. Stomach/Bowel: Diffuse wall thickening noted in the rectosigmoid colon concerning for colitis. Normal appendix. Stomach and small bowel decompressed, unremarkable. Vascular/Lymphatic: Aortic  atherosclerosis. No evidence of aneurysm or adenopathy. Reproductive: Uterus and adnexa unremarkable.  No mass. Other: No free fluid or free air. Musculoskeletal: No acute bony abnormality. IMPRESSION: Wall thickening within the rectosigmoid colon concerning for infectious or inflammatory colitis. Slight stranding adjacent to the pancreatic tail raising the possibility of acute focal pancreatitis. Left lower lobe airspace opacity could reflect atelectasis or pneumonia. Electronically Signed   By: Charlett Nose M.D.   On: 02/11/2022 18:06   Recent Labs    02/11/22 1059 02/12/22 0749  WBC  --  12.5*  HGB 9.7* 9.3*  HCT 30.2* 29.0*  PLT  --  436*    Recent Labs    02/12/22 0749 02/13/22 0641  NA 136 134*  K 3.3* 3.6  CL 106 105  CO2 20* 20*  GLUCOSE 76 81  BUN <5* <5*  CREATININE 0.55 0.60  CALCIUM 10.4* 10.1     Intake/Output Summary (Last 24 hours) at 02/13/2022 1242 Last data filed at 02/13/2022 1210 Gross per 24 hour  Intake 778 ml  Output --  Net 778 ml         Physical Exam: Vital Signs Blood pressure 134/80, pulse 86, temperature (!) 97.4 F (36.3 C), temperature source Temporal, resp. rate 16, last menstrual period 01/16/2016, SpO2 100 %.   Unable to perform d/t off unit. Prior exam:  General: awake, alert, appropriate, laying in bed; appearance improved;  NAD HENT: conjugate gaze; pale conjunctivae B/L, moist mucus membranes. Trach site pinpoint opening, clean.  CV: regular rhythm, regular rate, no m.r.g;  no JVD Pulmonary: Decreased breath sounds in lower L>R lung. Greyish mild sputum production in cup. CTAB.  GI: soft, nondistended; +hypoactive BS. No TTP, no rebound tenderness.  Psychiatric: appropriate, in better spirits Neurological: AAOx3. Appropriate. CN 2-12 intact.  Musculoskeletal: Moving all 4 limbs in bed. BL LE plantarflexion w/ tight heel cords.   Assessment/Plan: 1. Functional deficits which require 3+ hours per day of interdisciplinary therapy  in a comprehensive inpatient rehab setting. Physiatrist is providing close team supervision and 24 hour management of active medical problems listed below. Physiatrist and rehab team continue to assess barriers to discharge/monitor patient progress toward functional and medical goals  Care Tool:  Bathing    Body parts bathed by patient: Chest, Abdomen, Right upper leg, Left upper leg, Face   Body parts bathed by helper: Left lower leg, Right lower leg, Front perineal area, Buttocks, Right arm, Left arm     Bathing assist Assist Level: Maximal Assistance - Patient 24 - 49%     Upper Body Dressing/Undressing Upper body dressing   What is the patient wearing?: Pull over shirt    Upper body assist Assist Level: Moderate Assistance - Patient 50 - 74%    Lower Body Dressing/Undressing Lower body dressing      What is the patient wearing?: Pants     Lower body assist Assist for lower body dressing: Maximal Assistance - Patient 25 - 49%     Toileting Toileting    Toileting assist Assist for toileting: Total Assistance - Patient < 25%     Transfers Chair/bed transfer  Transfers assist     Chair/bed transfer assist level: Maximal Assistance - Patient 25 - 49%     Locomotion Ambulation   Ambulation assist      Assist level: Maximal Assistance - Patient 25 - 49%   Max distance: 2'   Walk 10 feet activity   Assist  Walk 10 feet activity did not occur: Safety/medical concerns        Walk 50 feet activity   Assist Walk 50 feet with 2 turns activity did not occur: Safety/medical concerns         Walk 150 feet activity   Assist Walk 150 feet activity did not occur: Safety/medical concerns         Walk 10 feet on uneven surface  activity   Assist Walk 10 feet on uneven surfaces activity did not occur: Safety/medical concerns         Wheelchair     Assist Is the patient using a wheelchair?: Yes Type of Wheelchair: Manual     Wheelchair assist level: Supervision/Verbal cueing Max wheelchair distance: 150'    Wheelchair 50 feet with 2 turns activity    Assist        Assist Level: Supervision/Verbal cueing   Wheelchair 150 feet activity     Assist      Assist Level: Supervision/Verbal cueing   Blood pressure 134/80, pulse 86, temperature (!) 97.4 F (36.3 C), temperature source Temporal, resp. rate 16, last menstrual period 01/16/2016, SpO2 100 %.  Medical Problem List and Plan: 1. 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  Antithrombotics: -DVT/anticoagulation:  Pharmaceutical: Heparin- will try and change  to Lovenox- as long as labs in AM are OK -> HgB stable, Cr WNL, switch to Lovenox inj 40 mg daily              -antiplatelet therapy: N/A 3. Pain Management: Lidoderm patch, Tylenol as needed  - Reports chronic neck pain with Percocet 10 mg at home; was getting Oxy 5 mg Q6H PRN per admission notes; resume Oxy 5 mg Q6H PRN, will wean as tolerated. Cannot continue pain medication on discharge d/t documented history of snorting oxycodone.   - States she gets gabapentin + Lyrica for peripheral neuropathy at home; not currently bothersome; monitor  4. Mood/Behavior/Sleep:              -antipsychotic agents: Clonazepam 0.5 mg 3 times daily, melatonin 3 mg nightly  5. Neuropsych/cognition: This patient is capable of making decisions on her own behalf.  6. Skin/Wound Care Stage II L inner buttock pressure ulcer- from Purewick; tracheostomy opening - will do foam dressing and monitor clinically -stop using Purewick- explained to pt. - Routine skin checks - Continue covering tracheostomy site; healing well, monitor  7. Fluids/Electrolytes/Nutrition: Routine in and outs with follow-up chemistries - Mild low K on admission labs;  added K dur 20 meq 1 tab BID; still low increase KCL to  - 1/15: K 3.3; poor PO with intermittent refusals; switch to K powder 40 meq BID for 3 days and re-test  - 1/16: DC K phos d/t NPO; labs pending  - 1/17: Had 80 meq IV K overnight for 2.9; 3.3 this AM. Repeat IV 80 meq today with labs in AM. Phos and Mag WNL, no repletion needed. Will resume clear liquid diet today per GI and given maintenance fluids 75 cc/hr NS for 12 hours.   - 1/18: Had 20 meq IV K + 80 meq PO K yesterday, with improvement of K to 3.6 this AM. Continue on 40 meq BID regimen and recheck in AM to ensure no further drop.   - Ca elevated but stable, monitor - Low protein/albumin, adding Boost supplement TID for nutrition and wound healing; change to ensure clear d/t clear liquid diet  8.  Respiratory failure/tracheostomy.  Decannulated 1 week ago- no air leak.  Check oxygen saturations every shift- off O2- current sats 96-97% - Admission: Coarse cough with light green sputum- will check f/u CXR  - 1/11 CXR: Multifocal airspace consolidation throughout the left lung appears stable. R lung clear. Vitals stable on RA. Continue current management.    1/14- WBC up to 17- per #15  1/14: WBC downtrending; likely source UTI  1/17: Afebrile, vitals stable, WBC continues downtrend on UTI Tx. Mild sputum production, complex pathology on CT abdomen but appears stable from prior images. Low suspicion for recurrent pneumonia, monitor closely   9.  AKI.  Resolved.  Latest creatinine 0.42.  Follow-up chemistries - Cr 0.5 this AM; WNL; encourage fluids with energy expenditure and follow  1/14- labs in AM  1/15: Cr stable 0.4; on IVF 75/hr for 24 hours; encouraged patient to drink 6x 250 cc water cups today given increased hydration needs with infection  1/16: DC IVF, labs and vitals stable  1/17: Resume IVF today as above; NPO yesterday; Cr stable  10.  History of tobacco polysubstance use.  Provide counseling  11.  Prediabetes.   Hemoglobin A1c 6.1.  Blood sugar checks discontinued  12.  Dysphagia.  Dysphagia #2 thin liquids.  Follow-up speech therapy  1/16: NPO except sips with meds  and ice chips d/t concern for colitis, awaiting GI eval  1/17: Clear liquid diet today, NPO at midnight for scope.   1/18: Cleared by GI to resume diet   13. Constipation- resolved Last recorded BM 2 d ago , will give glycerin supp, await KUB  1/14- had moderate stool burden- given SMOG enema last night- pt having bright red clots since then  1/15: Educated patient on intermittent refusals of bowel meds  1/17: Continue BID miralax 17 g per GI recs; bowel PREP today for scope in AM  1/18: BID miralax per GI; had multiple enemas for bowel prep  14. BRBPR w/ anemia, colitis  1/14- Hb is actually up from 9.1 to 11- so think blood loss is negligible- however will make sure has labs in AM as well.   1/15: HgB 9.5; H/H repeat ordered in AM d/t likely hemoconcentrated with prior 11 and no further BRBPR   1/16: 2x liquis red mucusy stools overnight. KUB 1/14 with ?colitis. Patient made NPO, awaiting GI evaluation, appreciate input. Crp, CMP, and CBC w diff ordered.  Remains N/V with PO Zofran and IV compazine Prn available.   1/17: GI consulted, appreciate their expertise and input. CT abdomen overnight with results showing ?rectosigmoid colitis. Unlikely infectious. Cleared for clear liquid diet today, patient to have colonoscopy vs. Flex sigmoidoscopy in AM; +/- EGD depending on iron panel.   1/18: See procedure note. Nonspecific colitis observed on flex sig. Biopsies pending. Per GI, cleared for PO diet with gentle miralax 17 g BID bowel regimen. Iron panel with low TIBC, high ferritin = likely anemia chronic disease, low suspicion for acute bleed.   15. Leukocytosis - Uti +/- colitis - improving  1/14- WBC up to 17k from 11k- will check U/A and Cx but if that's negative, think the significant pleural effusion/multiloculated cosolidation on L  lung could be getting infected- if need be, wil call ID to discuss- cannot see if this was treatment in [facility] after 1/1 when WBC went up to [facility]  1/15: See above; likely UTI started Keflex 500 mg q8 hours and since not voiding a lot, will start NS IVFs 75cc hour for 1 day -> switched to Rocephin 1 G daily d/t poor PO tolerance, plan to switch back to PO once improved  1/16: Continue IV abx d/t NPO, N/V; awaiting sensitivities  1/17: WBC improving; continue IV Abx until post-scope and then advance to PO   1/18: Out to procedure today, switch to PO tomorrow AM with labs  17. Tachycardia - improved  1/14- has been elevated, but a little more this AM- could be due to leukocytosis- will have low threshold for procalcitonin/more w/u, however O2 sats 96-99%  1/15 - stable, no changes, continue IVF 1/16: DC IVF  1/17: Maintenance fluids 75 cc/hr NS for 12 hours given NPO yesterday, NPO tonight   LOS: 7 days A FACE TO FACE EVALUATION WAS PERFORMED  Gertie Gowda 02/13/2022, 12:42 PM

## 2022-02-13 NOTE — Evaluation (Signed)
Recreational Therapy Assessment and Plan  Patient Details  Name: Janet Hensley MRN: [id number] Date of Birth: July 13, 1970 Today's Date: 02/13/2022  Rehab Potential:  Good ELOS:   d/c  2/2  Assessment Hospital Problem: Principal Problem:   Critical illness myopathy     Past Medical History:      Past Medical History:  Diagnosis Date   Asthma 01/15/2012    Past Surgical History:       Past Surgical History:  Procedure Laterality Date   LEFT HEART CATH AND CORONARY ANGIOGRAPHY N/A 12/24/2021    Procedure: LEFT HEART CATH AND CORONARY ANGIOGRAPHY;  Surgeon: Nelva Bush, MD;  Location: [facility];  Service: Cardiovascular;  Laterality: N/A;      Assessment & Plan Clinical Impression: Patient is a 52 year old right-handed female with history of former tobacco use as well as polysubstance abuse.  Per chart review patient lives with her husband and brother.  Independent prior to admission.  1 level home 4 steps to entry.   Presented 12/22/2021 to [facility] with increasing shortness of breath and vomiting hypoxic 89% requiring intubation as well as mechanical ventilation.  Chest x-ray showed complete whiteout of left lung.  Bronchoscopy showed purulent secretions on the left which were suctioned out.  Blood culture showed Rothia Muciliaginosa, urine tested positive for Legionella, bronchoscopy positive for Pseudomonas and stenotrophomonas.  ID consulted placed on broad-spectrum antibiotics.  Hospital course complicated by AKI and bicarbonate drip was started.  Patient was transferred to [facility] 12/2 for ARDS management and possible need for ECMO.  Patient did receive ECMO.  Unable to wean off vent with tracheostomy placed 12/8.  She was transferred to [facility] 12/13 for ongoing ventilation care.  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  Patient has been decannulated and diet advanced dysphagia #2 thin liquids.  She has been weaned  from oxygen.  AKI resolved latest creatinine 0.42.  She did have bouts of hypotension placed on midodrine.  Maintained on subcutaneous heparin for DVT prophylaxis.  Patient transferred to CIR on 02/06/2022 .    Pt presents with decreased activity tolerance, decreased functional mobility, decreased balance, feelings of stress/anxiety Limiting pt's independence with leisure/community pursuits.  Met with pt today to discuss TR services including leisure education, activity analysis/modifications and stress management.  Also discussed the importance of social, emotional, spiritual health in addition to physical health and their effects on overall health and wellness.  Pt stated understanding.   Plan  Min 1 TR session during LOS  Recommendations for other services: Neuropsych  Discharge Criteria: Patient will be discharged from TR if patient refuses treatment 3 consecutive times without medical reason.  If treatment goals not met, if there is a change in medical status, if patient makes no progress towards goals or if patient is discharged from hospital.  The above assessment, treatment plan, treatment alternatives and goals were discussed and mutually agreed upon: by patient  [place] 02/13/2022, 3:28 PM

## 2022-02-13 NOTE — Interval H&P Note (Signed)
History and Physical Interval Note: Patient vomited part of her prep yesterday during the day.  She then was going to drink prep overnight and states she fell asleep and never drank it.  She does not think she was able to drink the prep and consented to flex sig today to evaluate her lower colon in light of her symptoms.  Enemas given in the preprocedure area.  Plan for flex sig this morning to further evaluate.  Discussed risk benefits she wants to proceed, further recommendations pending results.  02/13/2022 9:22 AM  Janet Hensley  has presented today for surgery, with the diagnosis of abnormal CT scan colon.  The various methods of treatment have been discussed with the patient and family. After consideration of risks, benefits and other options for treatment, the patient has consented to  Procedure(s): FLEXIBLE SIGMOIDOSCOPY (N/A) as a surgical intervention.  The patient's history has been reviewed, patient examined, no change in status, stable for surgery.  I have reviewed the patient's chart and labs.  Questions were answered to the patient's satisfaction.     [place]

## 2022-02-13 NOTE — Anesthesia Postprocedure Evaluation (Signed)
Anesthesia Post Note  Patient: Janet Hensley  Procedure(s) Performed: FLEXIBLE SIGMOIDOSCOPY BIOPSY     Patient location during evaluation: PACU Anesthesia Type: MAC Level of consciousness: awake and alert and oriented Pain management: pain level controlled Vital Signs Assessment: post-procedure vital signs reviewed and stable Respiratory status: spontaneous breathing, nonlabored ventilation and respiratory function stable Cardiovascular status: stable and blood pressure returned to baseline Postop Assessment: no apparent nausea or vomiting Anesthetic complications: no   No notable events documented.  Last Vitals:  Vitals:   02/13/22 1220 02/13/22 1230  BP: 122/82 134/80  Pulse: 89 86  Resp: (!) 21 16  Temp:    SpO2: 99% 100%    Last Pain:  Vitals:   02/13/22 1215  TempSrc: Temporal  PainSc: 0-No pain                 [name] A.

## 2022-02-13 NOTE — Progress Notes (Signed)
Physical Therapy Session Note  Patient Details  Name: Janet Hensley MRN: [id number] Date of Birth: 1970/03/28  Today's Date: 02/13/2022 PT Individual Time: 1415-1500 PT Individual Time Calculation (min): 45 min  and Today's Date: 02/13/2022 PT Missed Time: 55 Minutes Missed Time Reason: Unavailable (Comment) (off unit for colonoscopy)  Short Term Goals: Week 1:  PT Short Term Goal 1 (Week 1): Pt will complete bed mobility with minA consistently. PT Short Term Goal 2 (Week 1): Pt will complete bed to chair transfer consistently PT Short Term Goal 3 (Week 1): Pt will ambulate x15' with modA and LRAD.  Skilled Therapeutic Interventions/Progress Updates:      1st Session: Pt out of room for colonoscopy.  2nd Session: Pt received seated in [facility] and agrees to therapy. Reports chronic back pain. PT provides mobility and rest breaks as needed to manage pain. WC transport to gym for time management. Pt performs sit to stand in parallel bars with modA and cues for hip extension and anterior weight shift. Pt ambulate x6' in parallel bars with minA and +2 WC follow for safety, with cues for upright posture to improve body mechanics, and step sequencing to initiate gait training. Pt takes extended seated rest break. Pt stands again with modA and same cues, this time ambulating x4' forward and x4' backward with minA. Cues to increase eccentric control of stand to sit for safety and strengthening. Pt performs additional stand and x4' forward and backward with similar assistance and cueing, and improved independence with sit to stand transfer with use of momentum. WC transport back to room. Left seated in WC with all needs within reach.  Therapy Documentation Precautions:  Precautions Precautions: Fall Restrictions Weight Bearing Restrictions: No  Therapy/Group: Individual Therapy  Breck Coons, PT, DPT 02/13/2022, 5:30 PM

## 2022-02-13 NOTE — Transfer of Care (Signed)
Immediate Anesthesia Transfer of Care Note  Patient: Janet Hensley  Procedure(s) Performed: FLEXIBLE SIGMOIDOSCOPY BIOPSY  Patient Location: Endoscopy Unit  Anesthesia Type:MAC  Level of Consciousness: awake, alert , and oriented  Airway & Oxygen Therapy: Patient Spontanous Breathing and Patient connected to nasal cannula oxygen  Post-op Assessment: Report given to RN and Post -op Vital signs reviewed and stable  Post vital signs: Reviewed and stable  Last Vitals:  Vitals Value Taken Time  BP    Temp    Pulse 94 02/13/22 1216  Resp    SpO2 100 % 02/13/22 1216  Vitals shown include unvalidated device data.  Last Pain:  Vitals:   02/13/22 0827  TempSrc: Temporal  PainSc: 0-No pain         Complications: No notable events documented.

## 2022-02-13 NOTE — Progress Notes (Signed)
Speech Language Pathology Weekly Progress and Session Note  Patient Details  Name: Janet Hensley MRN: [id number] Date of Birth: Nov 08, 1970  Beginning of progress report period: February 06, 2022 End of progress report period: February 13, 2022  Today's Date: 02/13/2022 SLP Individual Time: 1330-1415 SLP Individual Time Calculation (min): 45 min  Short Term Goals: Week 1: SLP Short Term Goal 1 (Week 1): Patient will consume current diet with minimal overt s/s of aspiration with Mod I for use of swallowing compensatory strategies. SLP Short Term Goal 1 - Progress (Week 1): Met SLP Short Term Goal 2 (Week 1): Patient will demonstrate efficient mastication with complete oral clearance with trials of regular textures without overt s/s of aspiration with Mod I over 2 sessions prior to upgrade. SLP Short Term Goal 2 - Progress (Week 1): Not met SLP Short Term Goal 3 (Week 1): Patient will demonstrate functional problem solving for mildly complex tasks with Min verbal cues. SLP Short Term Goal 3 - Progress (Week 1): Met SLP Short Term Goal 4 (Week 1): Patient will recall new, daily information with Min verbal and visual cues for use of compensatory strategies. SLP Short Term Goal 4 - Progress (Week 1): Met    New Short Term Goals: Week 2: SLP Short Term Goal 1 (Week 2): Patient will recall new, daily information with supervision verbal and visual cues for use of compensatory strategies. SLP Short Term Goal 2 (Week 2): Patient will demonstrate functional problem solving for mildly complex tasks with Supervision verbal cues. SLP Short Term Goal 3 (Week 2): Patient will demonstrate efficient mastication with complete oral clearance with trials of regular textures without overt s/s of aspiration with Mod I over 2 sessions prior to upgrade.  Weekly Progress Updates: Patient has made functional gains and has met 3 of 4 STGs this reporting period. Trials of upgraded textures have not been addressed this  reporting period due to GI issues resulting in patient requiring a clear liquid diet. However, patient is consuming thin liquids via straw without overt s/s of aspiration with overall Mod I for use of swallowing compensatory strategies. Currently, patient is completing functional and mildly complex tasks safely in regards to problem solving and recall with use of strategies. Patient and family education is ongoing. Patient would benefit from continued skilled SLP intervention to maximize her cognitive and swallowing function prior to discharge.     Intensity: Minumum of 1-2 x/day, 30 to 90 minutes Frequency: 3 to 5 out of 7 days Duration/Length of Stay: 02/28/22 Treatment/Interventions: Cognitive remediation/compensation;Dysphagia/aspiration precaution training;Internal/external aids;Speech/Language facilitation;Therapeutic Activities;Environmental controls;Cueing hierarchy;Functional tasks;Patient/family education   Daily Session  Skilled Therapeutic Interventions:  Skilled treatment session focused on cognitive goals. SLP facilitated session by providing overall Min-Mod A verbal cues for selective attention in a moderately distracting environment and functional problem solving during a complex scheduling task. Patient left upright in wheelchair with alarm on and all needs within reach. Continue with current plan of care.     Pain No/Denies Pain   Therapy/Group: Individual Therapy  Janet Hensley 02/13/2022, 6:23 AM

## 2022-02-14 ENCOUNTER — Encounter (HOSPITAL_COMMUNITY): Payer: Self-pay | Admitting: Physical Medicine and Rehabilitation

## 2022-02-14 DIAGNOSIS — G7281 Critical illness myopathy: Secondary | ICD-10-CM | POA: Diagnosis not present

## 2022-02-14 DIAGNOSIS — R933 Abnormal findings on diagnostic imaging of other parts of digestive tract: Secondary | ICD-10-CM | POA: Diagnosis not present

## 2022-02-14 DIAGNOSIS — K625 Hemorrhage of anus and rectum: Secondary | ICD-10-CM | POA: Diagnosis not present

## 2022-02-14 LAB — CBC WITH DIFFERENTIAL/PLATELET
Abs Immature Granulocytes: 0.12 10*3/uL — ABNORMAL HIGH (ref 0.00–0.07)
Basophils Absolute: 0.1 10*3/uL (ref 0.0–0.1)
Basophils Relative: 1 %
Eosinophils Absolute: 0.4 10*3/uL (ref 0.0–0.5)
Eosinophils Relative: 4 %
HCT: 29.3 % — ABNORMAL LOW (ref 36.0–46.0)
Hemoglobin: 9.1 g/dL — ABNORMAL LOW (ref 12.0–15.0)
Immature Granulocytes: 1 %
Lymphocytes Relative: 20 %
Lymphs Abs: 2 10*3/uL (ref 0.7–4.0)
MCH: 30.7 pg (ref 26.0–34.0)
MCHC: 31.1 g/dL (ref 30.0–36.0)
MCV: 99 fL (ref 80.0–100.0)
Monocytes Absolute: 1 10*3/uL (ref 0.1–1.0)
Monocytes Relative: 10 %
Neutro Abs: 6.7 10*3/uL (ref 1.7–7.7)
Neutrophils Relative %: 64 %
Platelets: 428 10*3/uL — ABNORMAL HIGH (ref 150–400)
RBC: 2.96 MIL/uL — ABNORMAL LOW (ref 3.87–5.11)
RDW: 15 % (ref 11.5–15.5)
WBC: 10.3 10*3/uL (ref 4.0–10.5)
nRBC: 0 % (ref 0.0–0.2)

## 2022-02-14 LAB — BASIC METABOLIC PANEL
Anion gap: 9 (ref 5–15)
BUN: <5 mg/dL — ABNORMAL LOW (ref 6–20)
CO2: 20 mmol/L — ABNORMAL LOW (ref 22–32)
Calcium: 10.5 mg/dL — ABNORMAL HIGH (ref 8.9–10.3)
Chloride: 104 mmol/L (ref 98–111)
Creatinine, Ser: 0.55 mg/dL (ref 0.44–1.00)
GFR, Estimated: >60 mL/min (ref >60)
Glucose, Bld: 85 mg/dL (ref 70–99)
Potassium: 4 mmol/L (ref 3.5–5.1)
Sodium: 133 mmol/L — ABNORMAL LOW (ref 135–145)

## 2022-02-14 MED ORDER — AMOXICILLIN 250 MG PO CAPS
500.0000 mg | ORAL_CAPSULE | Freq: Three times a day (TID) | ORAL | Status: AC
  Administered 2022-02-14 – 2022-02-16 (×8): 500 mg via ORAL
  Filled 2022-02-14 (×8): qty 2

## 2022-02-14 NOTE — Progress Notes (Signed)
Speech Language Pathology Daily Session Note  Patient Details  Name: Janet Hensley MRN: [id number] Date of Birth: 1970-11-20  Today's Date: 02/14/2022 SLP Individual Time: 1400-1445 SLP Individual Time Calculation (min): 45 min  Short Term Goals: Week 2: SLP Short Term Goal 1 (Week 2): Patient will recall new, daily information with supervision verbal and visual cues for use of compensatory strategies. SLP Short Term Goal 2 (Week 2): Patient will demonstrate functional problem solving for mildly complex tasks with Supervision verbal cues. SLP Short Term Goal 3 (Week 2): Patient will demonstrate efficient mastication with complete oral clearance with trials of regular textures without overt s/s of aspiration with Mod I over 2 sessions prior to upgrade.  Skilled Therapeutic Interventions: Skilled ST treatment focused on cognitive goals. Pt was greeted in bed on arrival and agreeable to ST intervention. SLP provided education regarding no tech and low tech options for compensatory memory strategies. Pt verbalized understanding and reported she likes to write things down. SLP demonstrated use of "notes" app and calendar on phone should pt choose to use phone at times to assist with recall. SLP facilitated a conversational working memory task in which she was given continuous information regarding two subjects and asked to recall information following brief (2 minutes) and extended delay (10-12 minutes). Pt completed with sup A verbal cues to recall 95% of information. Pt/SLP had conversation regarding pt's dogs. Pt was unable to recall one of their names despite various strategies (first letter, looking at pictures, etc). Pt reported memory as improving however certain information continues to "get lost in my mind." Patient was left in bed with alarm activated and immediate needs within reach at end of session. Continue per current plan of care.      Pain  None/denied  Therapy/Group: Individual  Therapy  Patty Sermons 02/14/2022, 2:14 PM

## 2022-02-14 NOTE — Progress Notes (Signed)
Physical Therapy Weekly Progress Note  Patient Details  Name: Janet Hensley MRN: [id number] Date of Birth: 1970/02/28  Beginning of progress report period: February 07, 2022 End of progress report period: February 14, 2022  Today's Date: 02/14/2022 PT Individual Time: 1001-1059 and 4098-1191 PT Individual Time Calculation (min): 58 min and 54 min  Patient has met 0 of 3 short term goals.  Pt is progressing toward mobility goals, but did not meet short term goals for this reporting period, continuing to require modA for bed mobility and transfers, and initiating gait training in parallel bars yesterday. Pt has improved strength and mobility, however, and therapist expects that pt will make good progress going forward. Pt will benefit from family education prior to discharge.  Patient continues to demonstrate the following deficits muscle weakness, decreased cardiorespiratoy endurance, decreased safety awareness and decreased memory, and decreased sitting balance, decreased standing balance, decreased postural control, and decreased balance strategies and therefore will continue to benefit from skilled PT intervention to increase functional independence with mobility.  Patient progressing toward long term goals..  Continue plan of care.  PT Short Term Goals Week 1:  PT Short Term Goal 1 (Week 1): Pt will complete bed mobility with minA consistently. PT Short Term Goal 1 - Progress (Week 1): Progressing toward goal PT Short Term Goal 2 (Week 1): Pt will complete bed to chair transfer with minA consistently PT Short Term Goal 2 - Progress (Week 1): Progressing toward goal PT Short Term Goal 3 (Week 1): Pt will ambulate x15' with modA and LRAD. PT Short Term Goal 3 - Progress (Week 1): Progressing toward goal Week 2:  PT Short Term Goal 1 (Week 2): Pt will perform bed mobility with minA consistently. PT Short Term Goal 2 (Week 2): Pt will perform bed to chair transfer with minA consistently. PT  Short Term Goal 3 (Week 2): Pt will ambulate x15' with minA and LRAD.  Skilled Therapeutic Interventions/Progress Updates:  Ambulation/gait training;Community reintegration;DME/adaptive equipment instruction;Neuromuscular re-education;Psychosocial support;Stair training;UE/LE Strength taining/ROM;Wheelchair propulsion/positioning;Balance/vestibular training;Discharge planning;Functional electrical stimulation;Pain management;Skin care/wound management;UE/LE Coordination activities;Therapeutic Activities;Cognitive remediation/compensation;Disease management/prevention;Functional mobility training;Patient/family education;Splinting/orthotics;Therapeutic Exercise;Visual/perceptual remediation/compensation   1st Session: Pt received semi-reclined in bed and agrees to therapy. Reports chronic back pain. Number not provided. PT provides rest breaks and repositioning to manage pain. Pt dons socks in supine with minA from PT to position R lower extremity in figure 4 position. Pt performs supine to sit with bed features and cues for logrolling and sequencing. Pt dons shoes at EOB with minA/modA. Pt performs stand pivot transfer to [facility] with modA and cues for initiation, anterior weight shift, sequencing, and positioning. WC transport to gym for time management. Pt attempts sit to stand from [facility] and achieve 75% of full stand and requests to sit back down due to "legs saying no". Pt performs x10 alternating LAQs then attempts again, standing with modA and cues for momentum, hand placement, and sequencing. Pt stands to acclimate legs to weightbearing, then ambulates x10' with modA to provide stability at hips and trunk, with cues for posture and safe RW management. Pt takes extended seated rest breaks on mat. Pt attempts to stand from mat and is able to clear buttocks but does not achieve trunk extension, requiring seated rest break. PT provides  increased cues on body mechanics and importance of engaging hip extensors. Pt  then completes sit to stand with modA and remains standing ~2 minutes for endurance training and stretching of gastroc-soleus complex. Stand pivot back to [facility]  with modA and same cues. Pt left seated in WC with all needs within reach.  2nd Session: Pt received supine in bed and agrees to therapy. Reports pain in legs and back. PT provides rest breaks as needed to manage pain. Pt performs supine to sit with logrolling technique and cues for hand placement sequencing. Pt performs stand pivot transfer from bed to [facility] with modA and cues for anterior weight shift. WC transport to gym.  Pt attempts sit to stand from [facility] to RW but has difficulty extending hips and trunk, requiring her to sit back in [place]. PT provides education on motor planning and sequencing and pt performs sit to stand to RW with modA. Pt ambulates x12' with RW and miNA/modA, with cues for upright gaze to improve posture and balance. +2 for WC follow for safety. Following extended seated rest break, pt ambulates x18' with RW and minA/modA, but has buckling of both legs with fatigue, requiring maxA to prevent fall and +2 from husband to bring WC, and PT guides pt's hips back safely. Following extended seated rest break, pt performs stand pivot to [place]ust[place] with modA and cues for body mechanics and powering up. Pt completes Nustep for strength and endurance training. Pt completes x12:00 at workload of 5 with average steps per mintue ~40. PT provides cues for hand and foot placement and completing full available ROM.   Stand pivot back to WC with modA. WC transport back to room. PT provides family ed on safe body mechanics for assisting pt and then pt's husband provides assistance for stand pivot back to bed. Sit to supine with modA. PT provides education on glute sets and bridge in hooklying to work on over weekend for glute and core strengthening. Pt left supine with all needs within reach. Husband signed off as caretaker for in room mobility assistance.    Therapy Documentation Precautions:  Precautions Precautions: Fall Restrictions Weight Bearing Restrictions: No   Therapy/Group: Individual Therapy  Breck Coons, PT, DPT 02/14/2022, 4:46 PM

## 2022-02-14 NOTE — Progress Notes (Signed)
Occupational Therapy Weekly Progress Note  Patient Details  Name: Janet Hensley MRN: [id number] Date of Birth: [birth year]  Beginning of progress report period: February 07, 2022 End of progress report period: February 14, 2022  Today's Date: 02/14/2022 OT Individual Time: 4128-7867 OT Individual Time Calculation (min): 85 min    Patient has met 1 of 3 short term goals.  Patient has been making slow but steady progress in therapy this week. She has had some set-backs medically with colotis, nausea, and vomiting which was a barrier to progress in the beginning of the week. This issues has bee resolving over the last few days and patients has made stedy progress since then. Plan of care remains the same.   Patient continues to demonstrate the following deficits: muscle weakness, decreased cardiorespiratoy endurance, and decreased standing balance, decreased postural control, and decreased balance strategies and therefore will continue to benefit from skilled OT intervention to enhance overall performance with BADL, iADL, and Reduce care partner burden.  Patient progressing toward long term goals..  Continue plan of care.  OT Short Term Goals Week 1:  OT Short Term Goal 1 (Week 1): Patient will complete toilet transfer with mod A  of 1 OT Short Term Goal 1 - Progress (Week 1): Progressing toward goal OT Short Term Goal 2 (Week 1): Patient will complete 1 step of toileting task OT Short Term Goal 2 - Progress (Week 1): Met OT Short Term Goal 3 (Week 1): Patient will stand at the sink with mod A of 1 in preparation for BADL Task. OT Short Term Goal 3 - Progress (Week 1): Progressing toward goal Week 2:  OT Short Term Goal 1 (Week 2): Patient will complete toilet transfer with mod A of 1 OT Short Term Goal 2 (Week 2): Patient will stand at the sink with mod A of 1 in preparation for BADL Task. OT Short Term Goal 3 (Week 2): Patient will achieve figure 4 poisiton with min A in preparation for LB  dressing and bathing tasks  Skilled Therapeutic Interventions/Progress Updates:    Pt greeted semi reclined in bed awake and agreeable to OT treatment session. Pt reported she had already been incontinent in brief, but thought she could go more on the commode. Pt completed bed mobility with HOB elevated and mod A. It took 2 trials for sit<>stand, then pivot and max A from OT. Pt voided more bladder on commode. Worked on hip hiking to try to complete peri-care, but pt still needed OT assist. Pt then completed stand-pivot to wc with max A and difficulty achieving full hip and trunk extension. Pt brought into bathroom for shower. Attempted stand-pivot using grab bars, but pt unable to power up. Utilized [place] for shower transfer with max A to power up. Bathing completed from tub bench in shower with pt able to lean head forward and bring UE's high enough to wash hair today. Max A to get B LE's into figure 4 position to allow pt to wash her feet and lower legs. Leaning used to wash buttocks with OT assist. Charlaine Dalton transfer out of shower in similar fashion. Pt completed dressing tasks from wc with pt able to pull shirt overhead today with supervision! Worked on figure 4 again to thread pant legs, but still needed OT assist. Sit<>stand in [place] with max A and unable to achieve full hip and trunk extension requiring total A from OT to get pants up over hips. Grooming tasks completed at the sink with set-up A  due to UE weakness and difficulty reaching items. Pt left seated in wc with alarm belt on, call bell in reach, and needs met.   Therapy Documentation Precautions:  Precautions Precautions: Fall Restrictions Weight Bearing Restrictions: No Pain: Pt reports back pain but no number given. Rest and repositioned for pain management.    Therapy/Group: Individual Therapy  Valma Cava 02/14/2022, 8:29 AM

## 2022-02-14 NOTE — Progress Notes (Signed)
PROGRESS NOTE   Subjective/Complaints:  No acute complaints. No events overnright. Difficulty sleeping d/t longstanding back pain overnight, trouble positioning in bed. Appetite much improved, requesting diet advancement today, ok with transition to PO antibiotics. Asking for grounds pass.   ROS: Denies fevers, chills, N/V, abdominal pain, constipation, diarrhea, SOB, cough, chest pain, new weakness or paraesthesias.    Objective:   No results found. Recent Labs    02/12/22 0749 02/14/22 0604  WBC 12.5* 10.3  HGB 9.3* 9.1*  HCT 29.0* 29.3*  PLT 436* 428*    Recent Labs    02/13/22 0641 02/14/22 0604  NA 134* 133*  K 3.6 4.0  CL 105 104  CO2 20* 20*  GLUCOSE 81 85  BUN <5* <5*  CREATININE 0.60 0.55  CALCIUM 10.1 10.5*     Intake/Output Summary (Last 24 hours) at 02/14/2022 1231 Last data filed at 02/14/2022 0900 Gross per 24 hour  Intake 580 ml  Output --  Net 580 ml         Physical Exam: Vital Signs Blood pressure 110/63, pulse 93, temperature 97.9 F (36.6 C), resp. rate 18, weight 75.8 kg, last menstrual period 01/16/2016, SpO2 96 %.   Unable to perform d/t off unit. Prior exam:  General: awake, alert, appropriate, laying in bed;   NAD.   HENT: conjugate gaze; pale conjunctivae B/L, moist mucus membranes. Trach site closed, open to air/ CV: regular rhythm, regular rate, no m.r.g; no JVD Pulmonary: Decreased breath sounds in lower L>R lung. CTAB. On RA.  GI: soft, nondistended; +hypoactive BS. No TTP, no rebound tenderness.  Psychiatric: appropriate mood and affect.  Neurological: AAOx3. Appropriate. CN 2-12 intact. No sensory loss.  Musculoskeletal: Moving all 4 limbs in bed. BL LE plantarflexion w/ tight heel cords, can PROM to neutral. No spasticity.    Assessment/Plan: 1. Functional deficits which require 3+ hours per day of interdisciplinary therapy in a comprehensive inpatient rehab  setting. Physiatrist is providing close team supervision and 24 hour management of active medical problems listed below. Physiatrist and rehab team continue to assess barriers to discharge/monitor patient progress toward functional and medical goals  Care Tool:  Bathing    Body parts bathed by patient: Chest, Abdomen, Right upper leg, Left upper leg, Face   Body parts bathed by helper: Left lower leg, Right lower leg, Front perineal area, Buttocks, Right arm, Left arm     Bathing assist Assist Level: Maximal Assistance - Patient 24 - 49%     Upper Body Dressing/Undressing Upper body dressing   What is the patient wearing?: Pull over shirt    Upper body assist Assist Level: Moderate Assistance - Patient 50 - 74%    Lower Body Dressing/Undressing Lower body dressing      What is the patient wearing?: Pants     Lower body assist Assist for lower body dressing: Maximal Assistance - Patient 25 - 49%     Toileting Toileting    Toileting assist Assist for toileting: Total Assistance - Patient < 25%     Transfers Chair/bed transfer  Transfers assist     Chair/bed transfer assist level: Maximal Assistance - Patient 25 - 49%  Locomotion Ambulation   Ambulation assist      Assist level: Maximal Assistance - Patient 25 - 49%   Max distance: 2'   Walk 10 feet activity   Assist  Walk 10 feet activity did not occur: Safety/medical concerns        Walk 50 feet activity   Assist Walk 50 feet with 2 turns activity did not occur: Safety/medical concerns         Walk 150 feet activity   Assist Walk 150 feet activity did not occur: Safety/medical concerns         Walk 10 feet on uneven surface  activity   Assist Walk 10 feet on uneven surfaces activity did not occur: Safety/medical concerns         Wheelchair     Assist Is the patient using a wheelchair?: Yes Type of Wheelchair: Manual    Wheelchair assist level:  Supervision/Verbal cueing Max wheelchair distance: 150'    Wheelchair 50 feet with 2 turns activity    Assist        Assist Level: Supervision/Verbal cueing   Wheelchair 150 feet activity     Assist      Assist Level: Supervision/Verbal cueing   Blood pressure 110/63, pulse 93, temperature 97.9 F (36.6 C), resp. rate 18, weight 75.8 kg, last menstrual period 01/16/2016, SpO2 96 %.  Medical Problem List and Plan: 1. 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  Antithrombotics: -DVT/anticoagulation:  Pharmaceutical: Heparin- will try and change to Lovenox- as long as labs in AM are OK -> HgB stable, Cr WNL, switch to Lovenox inj 40 mg daily              -antiplatelet therapy: N/A 3. Pain Management: Lidoderm patch, Tylenol as needed  - Reports chronic neck pain with Percocet 10 mg at home; was getting Oxy 5 mg Q6H PRN per admission notes; resume Oxy 5 mg Q6H PRN, will wean as tolerated. Cannot prescribe pain medication on discharge d/t documented history of snorting oxycodone; patient aware.   - States she gets gabapentin + Lyrica for peripheral neuropathy at home; not currently bothersome; monitor  4. Mood/Behavior/Sleep:              -antipsychotic agents: Clonazepam 0.5 mg 3 times daily, melatonin 3 mg nightly  - Consider air mattress if back pain/positioning severely limiting sleep  5. Neuropsych/cognition: This patient is capable of making decisions on her own behalf.  - Per Rec therapy recs, neuropsych consulted. Assessment pending.   6. Skin/Wound Care Stage II L inner buttock pressure ulcer- from [place]; tracheostomy opening - will do foam dressing and monitor clinically -stop using Purewick- explained to pt. - Routine skin checks - tracheostomy site; healed, open to air  7.  Fluids/Electrolytes/Nutrition: Routine in and outs with follow-up chemistries - Mild low K on admission labs; added K dur 20 meq 1 tab BID; still low increase KCL to 12meq  - 1/15: K 3.3; poor PO with intermittent refusals; switch to K powder 40 meq BID for 3 days and re-test  - 1/16: DC K phos d/t NPO; labs pending  - 1/17: Had 80 meq IV K overnight for 2.9; 3.3 this AM.  Repeat IV 80 meq today with labs in AM. Phos and Mag WNL, no repletion needed. Will resume clear liquid diet today per GI and given maintenance fluids 75 cc/hr NS for 12 hours.   - 1/18: Had 20 meq IV K + 80 meq PO K yesterday, with improvement of K to 3.6 this AM. Continue on 40 meq BID regimen and recheck in AM to ensure no further drop.   - 1/19: K repleted; continue 40 meq BID PO and re-test monday  - Ca elevated but stable, monitor - Low protein/albumin, adding Boost supplement TID for nutrition and wound healing  - 1/19: Advance diet to Dys 3 with thins  8.  Respiratory failure/tracheostomy.  Decannulated 1 week ago- no air leak.  Check oxygen saturations every shift- off O2- current sats 96-97% - Admission: Coarse cough with light green sputum- will check f/u CXR  - 1/11 CXR: Multifocal airspace consolidation throughout the left lung appears stable. R lung clear. Vitals stable on RA. Continue current management.    1/14- WBC up to 17- per #15  1/14: WBC downtrending; likely source UTI  1/17: Afebrile, vitals stable, WBC continues downtrend on UTI Tx. Mild sputum production, complex pathology on CT abdomen but appears stable from prior images. Low suspicion for recurrent pneumonia, monitor closely   9.  AKI.  Resolved.  Latest creatinine 0.42.  Follow-up chemistries - Cr 0.5 this AM; WNL; encourage fluids with energy expenditure and follow  1/14- labs in AM  1/15: Cr stable 0.4; on IVF 75/hr for 24 hours; encouraged patient to drink 6x 250 cc water cups today given increased hydration needs with infection  1/16: DC  IVF, labs and vitals stable  1/17: Resume IVF today as above; NPO yesterday; Cr stable  1/19: Dced IV abx, fluids; now tolerating PO. Labs Monday  10.  History of tobacco polysubstance use.  Provide counseling  11.  Prediabetes.  Hemoglobin A1c 6.1.  Blood sugar checks discontinued  12.  Dysphagia.  Dysphagia #2 thin liquids.  Follow-up speech therapy  1/16: NPO except sips with meds and ice chips d/t concern for colitis, awaiting GI eval  1/17: Clear liquid diet today, NPO at midnight for scope.   1/18: Cleared by GI to resume diet  - Dys 3 with thins 1/19  13. Constipation- resolved Last recorded BM 2 d ago , will give glycerin supp, await KUB  1/14- had moderate stool burden- given SMOG enema last night- pt having bright red clots since then  1/15: Educated patient on intermittent refusals of bowel meds  1/17: Continue BID miralax 17 g per GI recs; bowel PREP today for scope in AM  1/18: BID miralax per GI; had multiple enemas for bowel prep  14. BRBPR w/ anemia, colitis  1/14- Hb is actually up from 9.1 to 11- so think blood loss is negligible- however will make sure has labs in AM as well.   1/15: HgB 9.5; H/H repeat ordered in AM d/t likely hemoconcentrated with prior 11 and no further BRBPR   1/16: 2x liquis red mucusy stools overnight. KUB 1/14 with ?colitis. Patient made NPO, awaiting GI evaluation, appreciate input. Crp, CMP, and CBC w diff ordered.  Remains N/V with PO Zofran and IV compazine Prn available.   1/17: GI consulted, appreciate their expertise and input. CT abdomen overnight with results showing ?rectosigmoid colitis. Unlikely infectious. Cleared for clear liquid diet today, patient to have colonoscopy vs. Flex sigmoidoscopy in AM; +/- EGD depending on iron panel.  1/18: See procedure note. Nonspecific colitis observed on flex sig. Biopsies pending. Per GI, cleared for PO diet with gentle miralax 17 g BID bowel regimen. Iron panel with low TIBC, high ferritin = likely  anemia chronic disease, low suspicion for acute bleed.   15. Leukocytosis - Uti +/- colitis - improving  1/14- WBC up to 17k from 11k- will check U/A and Cx but if that's negative, think the significant pleural effusion/multiloculated cosolidation on L lung could be getting infected- if need be, wil call ID to discuss- cannot see if this was treatment in [facility] after 1/1 when WBC went up to [facility]  1/15: See above; likely UTI started Keflex 500 mg q8 hours and since not voiding a lot, will start NS IVFs 75cc hour for 1 day -> switched to Rocephin 1 G daily d/t poor PO tolerance, plan to switch back to PO once improved  1/16: Continue IV abx d/t NPO, N/V; awaiting sensitivities  1/17: WBC improving; continue IV Abx until post-scope and then advance to PO   1/18: Out to procedure today, switch to PO tomorrow AM with labs  1/19: Switched to ampicillin 500 mg TID PO for 3 days; WBC WNL  17. Tachycardia - improved  1/14- has been elevated, but a little more this AM- could be due to leukocytosis- will have low threshold for procalcitonin/more w/u, however O2 sats 96-99%  1/15 - stable, no changes, continue IVF 1/16: DC IVF  1/17: Maintenance fluids 75 cc/hr NS for 12 hours given NPO yesterday, NPO tonight 1/19: IVF Dced, encourage PO fluids   LOS: 8 days A FACE TO FACE EVALUATION WAS PERFORMED  Angelina Sheriff 02/14/2022, 12:31 PM

## 2022-02-14 NOTE — Progress Notes (Signed)
Daily Progress Note  Hospital Day: 9  Chief Complaint: abdominal pain and colitis on CT scan  Brief Narrative:  Kirstine C Perryman is a 52 y.o. female with a pmh of smoking, COPD, asthma. Narcotics/polysubstance abuse. She was transferred to [facility] on 02/06/22 from [facility] where she ad been for trach weaning. In CIR she starting having abdominal pain , N/V. We consulted 1/16   Assessment    # 52 yo female with abdominal pain / blood in stool / colitis on CT scan. Flex sigmoidoscopy yesterday ( didn't tolerate much prep so could do colonoscopy) with findings of mild inflammation of rectosigmoid colon and distal colon. Biopsies pending but didn't appear to be ischemic. Today her WBC is normal, hgb stable. No further abdominal pain. No nausea today.   # ? pancreatitis on CT scan but not definitive. Lipase 64. She has no epigastric pain. She has had some vomiting recently which can elevate lipase.   # [place] anemia, doesn't appear iron deficiency by labs. Unsure if anemia is chronic. Hgb stable at 9.1  Plan:    Await colon biopsies.  Colonoscopy when acute issues resolve. Outpatient most likely  Subjective   Feels okay today. No abdominal pain. No diarrhea / bleeding. Waiting on husband to bring her [employer]   Objective  Endoscopic studies:  Flexible sigmoidoscopy    Imaging:  CT ABDOMEN PELVIS W CONTRAST  Result Date: 02/11/2022 CLINICAL DATA:  Lower GI bleed EXAM: CT ABDOMEN AND PELVIS WITH CONTRAST TECHNIQUE: Multidetector CT imaging of the abdomen and pelvis was performed using the standard protocol following bolus administration of intravenous contrast. RADIATION DOSE REDUCTION: This exam was performed according to the departmental dose-optimization program which includes automated exposure control, adjustment of the mA and/or kV according to patient size and/or use of iterative reconstruction technique. CONTRAST:  54mL OMNIPAQUE IOHEXOL 350 MG/ML SOLN COMPARISON:  02/16/2013  FINDINGS: Lower chest: Consolidation in the left lower lobe peripherally. Cannot exclude pneumonia. Tree-in-bud nodular densities in the lower lobes bilaterally, right greater than left. No effusions. Hepatobiliary: 1.7 cm cyst in the right hepatic lobe. No suspicious focal hepatic abnormality. Gallbladder unremarkable. No biliary ductal dilatation. Pancreas: Mild stranding adjacent to pancreatic tail concerning for early focal acute pancreatitis. No ductal dilatation. Spleen: No focal abnormality.  Normal size. Adrenals/Urinary Tract: No adrenal abnormality. No focal renal abnormality. No stones or hydronephrosis. Urinary bladder is unremarkable. Stomach/Bowel: Diffuse wall thickening noted in the rectosigmoid colon concerning for colitis. Normal appendix. Stomach and small bowel decompressed, unremarkable. Vascular/Lymphatic: Aortic atherosclerosis. No evidence of aneurysm or adenopathy. Reproductive: Uterus and adnexa unremarkable.  No mass. Other: No free fluid or free air. Musculoskeletal: No acute bony abnormality. IMPRESSION: Wall thickening within the rectosigmoid colon concerning for infectious or inflammatory colitis. Slight stranding adjacent to the pancreatic tail raising the possibility of acute focal pancreatitis. Left lower lobe airspace opacity could reflect atelectasis or pneumonia. Electronically Signed   By: Rolm Baptise M.D.   On: 02/11/2022 18:06    Lab Results: Recent Labs    02/12/22 0749 02/14/22 0604  WBC 12.5* 10.3  HGB 9.3* 9.1*  HCT 29.0* 29.3*  PLT 436* 428*   BMET Recent Labs    02/12/22 0749 02/13/22 0641 02/14/22 0604  NA 136 134* 133*  K 3.3* 3.6 4.0  CL 106 105 104  CO2 20* 20* 20*  GLUCOSE 76 81 85  BUN <5* <5* <5*  CREATININE 0.55 0.60 0.55  CALCIUM 10.4* 10.1 10.5*  LFT Recent Labs    02/12/22 0749  PROT 7.0  ALBUMIN 2.3*  AST 14*  ALT 18  ALKPHOS 124  BILITOT 0.6   PT/INR No results for input(s): "LABPROT", "INR" in the last 72  hours.   Scheduled inpatient medications:   amoxicillin  500 mg Oral Q8H   clonazePAM  0.5 mg Oral TID   diclofenac Sodium  2 g Topical BID   enoxaparin (LOVENOX) injection  40 mg Subcutaneous Q24H   famotidine  20 mg Oral BID   feeding supplement  1 Container Oral TID BM   influenza vac split quadrivalent PF  0.5 mL Intramuscular Tomorrow-1000   lidocaine  2 patch Transdermal Q24H   melatonin  3 mg Oral QHS   midodrine  10 mg Oral TID WC   pneumococcal 20-valent conjugate vaccine  0.5 mL Intramuscular Tomorrow-1000   polyethylene glycol  17 g Oral BID   potassium chloride  40 mEq Oral BID   sorbitol  60 mL Oral Once   Continuous inpatient infusions:   lactated ringers 10 mL/hr at 02/13/22 0833   PRN inpatient medications: acetaminophen, ondansetron, oxyCODONE, prochlorperazine  Vital signs in last 24 hours: Temp:  [97.8 F (36.6 C)-98.1 F (36.7 C)] 98.1 F (36.7 C) (01/19 1351) Pulse Rate:  [92-102] 92 (01/19 1351) Resp:  [16-18] 18 (01/19 1351) BP: (110-123)/(63-75) 116/75 (01/19 1351) SpO2:  [95 %-100 %] 100 % (01/19 1351) Weight:  [75.8 kg] 75.8 kg (01/18 1722) Last BM Date : 02/13/22  Intake/Output Summary (Last 24 hours) at 02/14/2022 1420 Last data filed at 02/14/2022 0900 Gross per 24 hour  Intake 240 ml  Output --  Net 240 ml    Intake/Output from previous day: 01/18 0701 - 01/19 0700 In: 540 [P.O.:240; I.V.:200; IV Piggyback:100] Out: -  Intake/Output this shift: Total I/O In: 240 [P.O.:240] Out: -    Physical Exam:  General: Alert female in NAD Heart:  Regular rate and rhythm.  Pulmonary: Normal respiratory effort Abdomen: Soft, nondistended, nontender. Normal bowel sounds. Extremities: No lower extremity edema  Neurologic: Alert and oriented Psych: Pleasant. Cooperative.    Principal Problem:   Critical illness myopathy Active Problems:   Lower abdominal pain   Rectal bleeding   Anemia   Nausea and vomiting   Abnormal CT scan,  gastrointestinal tract     LOS: 8 days   Tye Savoy ,NP 02/14/2022, 2:20 PM

## 2022-02-15 DIAGNOSIS — G7281 Critical illness myopathy: Secondary | ICD-10-CM | POA: Diagnosis not present

## 2022-02-15 MED ORDER — SORBITOL 70 % SOLN
30.0000 mL | Freq: Once | Status: AC
  Administered 2022-02-15: 30 mL via ORAL
  Filled 2022-02-15: qty 30
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
## 2022-02-15 NOTE — Progress Notes (Addendum)
Speech Language Pathology Daily Session Note  Patient Details  Name: Janet Hensley MRN: [id number] Date of Birth: 01-Jan-1971  Today's Date: 02/15/2022 SLP Individual Time: 8563-1497 SLP Individual Time Calculation (min): 45 min  Short Term Goals: Week 2: SLP Short Term Goal 1 (Week 2): Patient will recall new, daily information with supervision verbal and visual cues for use of compensatory strategies. SLP Short Term Goal 2 (Week 2): Patient will demonstrate functional problem solving for mildly complex tasks with Supervision verbal cues. SLP Short Term Goal 3 (Week 2): Patient will demonstrate efficient mastication with complete oral clearance with trials of regular textures without overt s/s of aspiration with Mod I over 2 sessions prior to upgrade.  Skilled Therapeutic Interventions: Pt seen this date for skilled ST intervention targeting deglutition and cognitive goals outlined above. Pt received lying supine in bed, sleeping in bed. Repositioned head of bed to promote safety with PO. Reporting pain in her back; LPN notified. States she has not had a bowel movement in three days and that her current bowel medications are not working + making her vomit; MD and LPN notified. Agreeable to intervention at bedside. Pleasant and participatory throughout. Intermittent coughing noted throughout session, with pt independently utilizing [place] when needed.   Today's session with emphasis on therapeutic trials of advanced textures and cognitive skill training. Pt demonstrated recall of functional information re: her medical status and cares at the Mod I level this date. Benefited from overall Sup A for providing purpose of her current medications; provided list for pt to reference and states that she would be interested in working on filling in pill organizer in upcoming sessions. Pt also noted to exhibit adequate anticipatory awareness and problem-solving skills re: her current bowel medications - asking  MD if she could break dosage in half to mitigate stomach sx's.  Re: deglutition, SLP provided regular solid textures (graham cracker with peanut butter) which pt self-administered following set-up assistance. From an oral standpoint, pt demonstrated efficient mastication with minimal oral residuals noted post-swallow which were cleared with liquid rinse. Pharyngeally, no overt s/sx concerning for airway intrusion noted with regular solid textures and thin liquids; however, coughing episode was noted post-prandially, at the conclusion of PO. Pt reports she did not feel it was directly relate to PO intake. At this time, recommend continuation of current diet consistencies and ongoing trials of regular textures with SLP; pt verbalized understanding.   Pt left in room with all safety measures activated, call bell within reach, and all immediate needs met. Continue per current ST POC.  Pain Pain reported in back; LPN notified and provided medication  Therapy/Group: Individual Therapy  Sonnie Bias A Caid Radin 02/15/2022, 3:23 PM

## 2022-02-15 NOTE — Progress Notes (Signed)
Physical Therapy Session Note  Patient Details  Name: Janet Hensley MRN: [id number] Date of Birth: 1970/08/13  Today's Date: 02/15/2022 PT Individual Time: 1300-1345 PT Individual Time Calculation (min): 45 min   Short Term Goals: Week 2:  PT Short Term Goal 1 (Week 2): Pt will perform bed mobility with minA consistently. PT Short Term Goal 2 (Week 2): Pt will perform bed to chair transfer with minA consistently. PT Short Term Goal 3 (Week 2): Pt will ambulate x15' with minA and LRAD.  Skilled Therapeutic Interventions/Progress Updates:  Patient greeted supine in bed with NT and significant other, Charlie, present and initially not agreeable to PT treatment session secondary to reports of back pain- Educated patient on the benefits of OOB mobility and stretching her back with movement; Patient eventually agreeable.   Patient transitioned from semi-reclined to sitting EOB with MinA for trunk management and use of bed rail. While sitting EOB, patient maintained sitting balance with distant supv and therapist donned shoes for time management. Patient performed stand pivot transfer with significant other and therapist providing supv for safety. Patient wheeled to rehab gym for time management and energy conservation.   Patient performed sit/stand x3 for gait trials, however was unsuccessful during her initial attempt due to poor hip extension. Patient provided with VC and demonstration for increased anterior weight shift and using a rocking momentum in order to assist with standing.   Patient gait trained x24' and x46' with the use of a RW and MinA with a wheelchair follow for safety. VC throughout for pursed lip breathing, tucking bottom and increased BOS with good effort noted. Patient demonstrated significant improvements once she regulated her breathing throughout gait trial. Extended seated rest break required in between each gait trial secondary to notable SOB and fatigue. HR ~133bpm after  second gait trial with SpO2 >96%.   Seated LAQ on B LE with 3 second hold, x20 total- Performed as active rest break.   Patient left sitting up with significant other in the wheelchair and went down to the cafeteria together for lunch with RN notified and aware.    Therapy Documentation Precautions:  Precautions Precautions: Fall Restrictions Weight Bearing Restrictions: No   Therapy/Group: Individual Therapy  Ladoris Lythgoe 02/15/2022, 8:00 AM

## 2022-02-16 ENCOUNTER — Encounter (HOSPITAL_COMMUNITY): Payer: Self-pay | Admitting: Gastroenterology

## 2022-02-16 DIAGNOSIS — G7281 Critical illness myopathy: Secondary | ICD-10-CM | POA: Diagnosis not present

## 2022-02-16 MED ORDER — SODIUM CHLORIDE 0.45 % IV SOLN: INTRAVENOUS | Status: DC

## 2022-02-16 MED ORDER — LUBIPROSTONE 24 MCG PO CAPS
24.0000 ug | ORAL_CAPSULE | Freq: Two times a day (BID) | ORAL | Status: DC
  Administered 2022-02-16 – 2022-02-28 (×24): 24 ug via ORAL
  Filled 2022-02-16 (×24): qty 1

## 2022-02-16 NOTE — Progress Notes (Addendum)
PROGRESS NOTE   Subjective/Complaints:  C/o constipation , abd pain , appreciate GI note Has used amitiza and relistor at home,, pt family showed Rx for [employer] .  Poor intake today with Nausea  ROS:   Pt denies SOB, abd pain, CP, N/V/ (+)C/D, and vision changes Except for HPI   Objective:   No results found. Recent Labs    02/14/22 0604  WBC 10.3  HGB 9.1*  HCT 29.3*  PLT 428*    Recent Labs    02/14/22 0604  NA 133*  K 4.0  CL 104  CO2 20*  GLUCOSE 85  BUN <5*  CREATININE 0.55  CALCIUM 10.5*     Intake/Output Summary (Last 24 hours) at 02/16/2022 1441 Last data filed at 02/16/2022 0901 Gross per 24 hour  Intake 0 ml  Output --  Net 0 ml         Physical Exam: Vital Signs Blood pressure 131/76, pulse 98, temperature 97.7 F (36.5 C), temperature source Oral, resp. rate 20, height 5\' 3"  (1.6 m), weight 75.8 kg, last menstrual period 01/16/2016, SpO2 95 %.   General: No acute distress Mood and affect are appropriate Heart: Regular rate and rhythm no rubs murmurs or extra sounds Lungs: Clear to auscultation, breathing unlabored, no rales or wheezes Abdomen: Positive bowel sounds, soft nontender to palpation, nondistended Extremities: No clubbing, cyanosis, or edema Skin: No evidence of breakdown, no evidence of rash   Neurological: AAOx3. Appropriate. CN 2-12 intact. No sensory loss.  4/5 in BUE and BLE  Musculoskeletal: Moving all 4 limbs in bed. BL LE plantarflexion w/ tight heel cords, can PROM to neutral. No spasticity.    Assessment/Plan: 1. Functional deficits which require 3+ hours per day of interdisciplinary therapy in a comprehensive inpatient rehab setting. Physiatrist is providing close team supervision and 24 hour management of active medical problems listed below. Physiatrist and rehab team continue to assess barriers to discharge/monitor patient progress toward functional and  medical goals  Care Tool:  Bathing    Body parts bathed by patient: Chest, Abdomen, Right upper leg, Left upper leg, Face   Body parts bathed by helper: Left lower leg, Right lower leg, Front perineal area, Buttocks, Right arm, Left arm     Bathing assist Assist Level: Maximal Assistance - Patient 24 - 49%     Upper Body Dressing/Undressing Upper body dressing   What is the patient wearing?: Pull over shirt    Upper body assist Assist Level: Moderate Assistance - Patient 50 - 74%    Lower Body Dressing/Undressing Lower body dressing      What is the patient wearing?: Pants     Lower body assist Assist for lower body dressing: Maximal Assistance - Patient 25 - 49%     Toileting Toileting    Toileting assist Assist for toileting: Total Assistance - Patient < 25%     Transfers Chair/bed transfer  Transfers assist     Chair/bed transfer assist level: Maximal Assistance - Patient 25 - 49%     Locomotion Ambulation   Ambulation assist      Assist level: Maximal Assistance - Patient 25 - 49%   Max distance:  2'   Walk 10 feet activity   Assist  Walk 10 feet activity did not occur: Safety/medical concerns        Walk 50 feet activity   Assist Walk 50 feet with 2 turns activity did not occur: Safety/medical concerns         Walk 150 feet activity   Assist Walk 150 feet activity did not occur: Safety/medical concerns         Walk 10 feet on uneven surface  activity   Assist Walk 10 feet on uneven surfaces activity did not occur: Safety/medical concerns         Wheelchair     Assist Is the patient using a wheelchair?: Yes Type of Wheelchair: Manual    Wheelchair assist level: Supervision/Verbal cueing Max wheelchair distance: 150'    Wheelchair 50 feet with 2 turns activity    Assist        Assist Level: Supervision/Verbal cueing   Wheelchair 150 feet activity     Assist      Assist Level:  Supervision/Verbal cueing   Blood pressure 131/76, pulse 98, temperature 97.7 F (36.5 C), temperature source Oral, resp. rate 20, height 5\' 3"  (1.6 m), weight 75.8 kg, last menstrual period 01/16/2016, SpO2 95 %.  Medical Problem List and Plan: 1. 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  Antithrombotics: -DVT/anticoagulation:  Pharmaceutical: Heparin- will try and change to Lovenox- as long as labs in AM are OK -> HgB stable, Cr WNL, switch to Lovenox inj 40 mg daily              -antiplatelet therapy: N/A 3. Pain Management: Lidoderm patch, Tylenol as needed  - Reports chronic neck pain with Percocet 10 mg at home; was getting Oxy 5 mg Q6H PRN per admission notes; Oxy 2.5 mg Q6H PRN, will wean as tolerated. Cannot prescribe pain medication on discharge d/t documented history of snorting oxycodone; patient aware.   - States she gets gabapentin + Lyrica for peripheral neuropathy at home; not currently bothersome; monitor  1/20- will change bed to air mattress ot help with back pain.  4. Mood/Behavior/Sleep:              -antipsychotic agents: Clonazepam 0.5 mg 3 times daily, melatonin 3 mg nightly  - Consider air mattress if back pain/positioning severely limiting sleep  1/20- made changes today 5. Neuropsych/cognition: This patient is capable of making decisions on her own behalf.  - Per Rec therapy recs, neuropsych consulted. Assessment pending.   6. Skin/Wound Care Stage II L inner buttock pressure ulcer- from [place]; tracheostomy opening - will do foam dressing and monitor clinically -stop using Purewick- explained to pt. - Routine skin checks - tracheostomy site; healed, open to air  7. Fluids/Electrolytes/Nutrition: Routine in and outs with follow-up chemistries - Mild low K on admission labs; added K dur 20 meq 1  tab BID; still low increase KCL to 61meq  - 1/15: K 3.3; poor PO with intermittent refusals; switch to K powder 40 meq BID for 3 days and re-test  - 1/16: DC K phos d/t NPO; labs pending  - 1/17: Had 80 meq IV K overnight for 2.9; 3.3 this AM. Repeat  IV 80 meq today with labs in AM. Phos and Mag WNL, no repletion needed. Will resume clear liquid diet today per GI and given maintenance fluids 75 cc/hr NS for 12 hours.   - 1/18: Had 20 meq IV K + 80 meq PO K yesterday, with improvement of K to 3.6 this AM. Continue on 40 meq BID regimen and recheck in AM to ensure no further drop.   - 1/19: K repleted; continue 40 meq BID PO and re-test monday  - Ca elevated but stable, monitor - Low protein/albumin, adding Boost supplement TID for nutrition and wound healing  - 1/19: Advance diet to Dys 3 with thins  8.  Respiratory failure/tracheostomy.  Decannulated 1 week ago- no air leak.  Check oxygen saturations every shift- off O2- current sats 96-97% - Admission: Coarse cough with light green sputum- will check f/u CXR  - 1/11 CXR: Multifocal airspace consolidation throughout the left lung appears stable. R lung clear. Vitals stable on RA. Continue current management.    1/14- WBC up to 17- per #15  1/14: WBC downtrending; likely source UTI  1/17: Afebrile, vitals stable, WBC continues downtrend on UTI Tx. Mild sputum production, complex pathology on CT abdomen but appears stable from prior images. Low suspicion for recurrent pneumonia, monitor closely   9.  AKI.  Resolved.  Latest creatinine 0.42.  Follow-up chemistries - Cr 0.5 this AM; WNL; encourage fluids with energy expenditure and follow  1/14- labs in AM  1/15: Cr stable 0.4; on IVF 75/hr for 24 hours; encouraged patient to drink 6x 250 cc water cups today given increased hydration needs with infection  1/16: DC IVF, labs and vitals stable  1/17: Resume IVF today as above; NPO yesterday; Cr stable  1/19: Dced IV abx, fluids; now tolerating PO.  Labs Monday Start IVF today recheck labs in am  Adjust nausea meds  Will try IV compazine once IV access is established 10.  History of tobacco polysubstance use.  Provide counseling  11.  Prediabetes.  Hemoglobin A1c 6.1.  Blood sugar checks discontinued  12.  Dysphagia.  Dysphagia #2 thin liquids.  Follow-up speech therapy  1/16: NPO except sips with meds and ice chips d/t concern for colitis, awaiting GI eval  1/17: Clear liquid diet today, NPO at midnight for scope.   1/18: Cleared by GI to resume diet  - Dys 3 with thins 1/19  13. Constipation- resolved Last recorded BM 2 d ago , will give glycerin supp, await KUB  1/14- had moderate stool burden- given SMOG enema last night- pt having bright red clots since then  1/15: Educated patient on intermittent refusals of bowel meds  1/17: Continue BID miralax 17 g per GI recs; bowel PREP today for scope in AM  1/18: BID miralax per GI; had multiple enemas for bowel prep  1/20- no BM in 3 days- said miralax and Sorbitol make her nauseated- will try breaking up sorbitol and giving compazine prn to help- if not, can try Lactulose if need be, but that can also make pt's nauseated as well Pt unable to tolerate the miralax, causes N/V Pt has Rx for Amitiza which keeps her regular at home will rx 14. BRBPR w/ anemia, colitis  1/14- Hb is actually up from 9.1 to 11- so think blood loss is negligible- however will make sure has labs in AM as well.   1/15: HgB 9.5; H/H repeat ordered in AM d/t likely hemoconcentrated with prior 11 and no further  BRBPR   1/16: 2x liquis red mucusy stools overnight. KUB 1/14 with ?colitis. Patient made NPO, awaiting GI evaluation, appreciate input. Crp, CMP, and CBC w diff ordered.  Remains N/V with PO Zofran and IV compazine Prn available.   1/17: GI consulted, appreciate their expertise and input. CT abdomen overnight with results showing ?rectosigmoid colitis. Unlikely infectious. Cleared for clear liquid diet today,  patient to have colonoscopy vs. Flex sigmoidoscopy in AM; +/- EGD depending on iron panel.   1/18: See procedure note. Nonspecific colitis observed on flex sig. Biopsies pending. Per GI, cleared for PO diet with gentle miralax 17 g BID bowel regimen. Iron panel with low TIBC, high ferritin = likely anemia chronic disease, low suspicion for acute bleed.    15. Leukocytosis - Uti +/- colitis - improving  1/14- WBC up to 17k from 11k- will check U/A and Cx but if that's negative, think the significant pleural effusion/multiloculated cosolidation on L lung could be getting infected- if need be, wil call ID to discuss- cannot see if this was treatment in [facility] after 1/1 when [facility]BC went up to [facility]  1/15: See above; likely UTI started Keflex 500 mg q8 hours and since not voiding a lot, will start NS IVFs 75cc hour for 1 day -> switched to Rocephin 1 G daily d/t poor PO tolerance, plan to switch back to PO once improved  1/16: Continue IV abx d/t NPO, N/V; awaiting sensitivities  1/17: WBC improving; continue IV Abx until post-scope and then advance to PO   1/18: Out to procedure today, switch to PO tomorrow AM with labs  1/19: Switched to ampicillin 500 mg TID PO for 3 days; WBC WNL Will try after IV compazine given , if not may need IV amp 17. Tachycardia - mild , asymptomatic  Vitals:   02/15/22 1936 02/16/22 0414  BP: 116/82 131/76  Pulse: 100 98  Resp: 18 20  Temp: 98.6 F (37 C) 97.7 F (36.5 C)  SpO2: 96% 95%      LOS: 10 days A FACE TO FACE EVALUATION WAS PERFORMED  Erick Colace 02/16/2022, 2:41 PM

## 2022-02-16 NOTE — Progress Notes (Signed)
Occupational Therapy Note  Patient Details  Name: Janet Hensley MRN: [id number] Date of Birth: [birth year]  Today's Date: 02/16/2022 OT Missed Time: 76 Minutes Missed Time Reason: Patient fatigue;Patient unwilling/refused to participate without medical reason    Patient refused OT stating she needs to rest due to poor sleep last night. OT encouraged participation by offering opportunities for ADL completion and educated on benefits of OOB. Patient reported she just doesn't feel up to it and was expecting to have the day off from therapies.  Ezekiel Slocumb 02/16/2022, 9:44 AM

## 2022-02-17 ENCOUNTER — Inpatient Hospital Stay (HOSPITAL_COMMUNITY): Payer: 59

## 2022-02-17 DIAGNOSIS — G7281 Critical illness myopathy: Secondary | ICD-10-CM | POA: Diagnosis not present

## 2022-02-17 LAB — COMPREHENSIVE METABOLIC PANEL
ALT: 18 U/L (ref 0–44)
AST: 12 U/L — ABNORMAL LOW (ref 15–41)
Albumin: 2.4 g/dL — ABNORMAL LOW (ref 3.5–5.0)
Alkaline Phosphatase: 117 U/L (ref 38–126)
Anion gap: 6 (ref 5–15)
BUN: 8 mg/dL (ref 6–20)
CO2: 23 mmol/L (ref 22–32)
Calcium: 10.7 mg/dL — ABNORMAL HIGH (ref 8.9–10.3)
Chloride: 100 mmol/L (ref 98–111)
Creatinine, Ser: 0.66 mg/dL (ref 0.44–1.00)
GFR, Estimated: >60 mL/min (ref >60)
Glucose, Bld: 104 mg/dL — ABNORMAL HIGH (ref 70–99)
Potassium: 3.7 mmol/L (ref 3.5–5.1)
Sodium: 129 mmol/L — ABNORMAL LOW (ref 135–145)
Total Bilirubin: 0.4 mg/dL (ref 0.3–1.2)
Total Protein: 6.6 g/dL (ref 6.5–8.1)

## 2022-02-17 LAB — CBC WITH DIFFERENTIAL/PLATELET
Abs Immature Granulocytes: 0.12 10*3/uL — ABNORMAL HIGH (ref 0.00–0.07)
Basophils Absolute: 0.1 10*3/uL (ref 0.0–0.1)
Basophils Relative: 1 %
Eosinophils Absolute: 0.6 10*3/uL — ABNORMAL HIGH (ref 0.0–0.5)
Eosinophils Relative: 5 %
HCT: 31.4 % — ABNORMAL LOW (ref 36.0–46.0)
Hemoglobin: 10.3 g/dL — ABNORMAL LOW (ref 12.0–15.0)
Immature Granulocytes: 1 %
Lymphocytes Relative: 17 %
Lymphs Abs: 2.1 10*3/uL (ref 0.7–4.0)
MCH: 31.8 pg (ref 26.0–34.0)
MCHC: 32.8 g/dL (ref 30.0–36.0)
MCV: 96.9 fL (ref 80.0–100.0)
Monocytes Absolute: 1 10*3/uL (ref 0.1–1.0)
Monocytes Relative: 8 %
Neutro Abs: 8.7 10*3/uL — ABNORMAL HIGH (ref 1.7–7.7)
Neutrophils Relative %: 68 %
Platelets: 456 10*3/uL — ABNORMAL HIGH (ref 150–400)
RBC: 3.24 MIL/uL — ABNORMAL LOW (ref 3.87–5.11)
RDW: 14.7 % (ref 11.5–15.5)
WBC: 12.6 10*3/uL — ABNORMAL HIGH (ref 4.0–10.5)
nRBC: 0 % (ref 0.0–0.2)

## 2022-02-17 MED ORDER — GLYCERIN (LAXATIVE) 2 G RE SUPP
1.0000 | RECTAL | Status: DC | PRN
  Administered 2022-02-17: 1 via RECTAL
  Filled 2022-02-17 (×3): qty 1

## 2022-02-17 NOTE — Progress Notes (Signed)
PROGRESS NOTE   Subjective/Complaints:  No BM since flex sig last week; 0 POs over the weekend, tolerating some sips of water only. Back on maintenance IV fluids; miralax Dced due to nausea/vomitting. Patient feeling constipated, + abdominal pain. Unsure if she has resumed Amitiza yet. Agreeable with suppository today and enema if no results.   Back pain worse with air mattress; has reverted back to standard mattress.   ROS: + N/V/C; Denies fevers, chills, diarrhea, SOB, cough, chest pain, new weakness or paraesthesias.    Objective:   No results found. Recent Labs    02/17/22 0611  WBC 12.6*  HGB 10.3*  HCT 31.4*  PLT 456*    Recent Labs    02/17/22 0611  NA 129*  K 3.7  CL 100  CO2 23  GLUCOSE 104*  BUN 8  CREATININE 0.66  CALCIUM 10.7*     Intake/Output Summary (Last 24 hours) at 02/17/2022 1253 Last data filed at 02/17/2022 0904 Gross per 24 hour  Intake 869.99 ml  Output 136 ml  Net 733.99 ml         Physical Exam: Vital Signs Blood pressure (!) 140/79, pulse 99, temperature 98.3 F (36.8 C), temperature source Oral, resp. rate 16, height 5\' 3"  (1.6 m), weight 70.4 kg, last menstrual period 01/16/2016, SpO2 99 %.   General: No acute distress   Mood and affect are appropriate Heart: Regular rate and rhythm no rubs murmurs or extra sounds Lungs: Clear to auscultation, breathing unlabored, no rales or wheezes Abdomen: Positive bowel sounds, mildly tender to palpation, nondistended Extremities: No clubbing, cyanosis, or edema Skin: No evidence of breakdown, no evidence of rash. +tracheostomy site closed.    Neurological: AAOx3. Appropriate. All 4 limbs antigravity in bed.  Musculoskeletal: Moving all 4 limbs in bed. BL LE plantarflexion w/ tight heel cords, can PROM to neutral. No spasticity.    Assessment/Plan: 1. Functional deficits which require 3+ hours per day of interdisciplinary therapy  in a comprehensive inpatient rehab setting. Physiatrist is providing close team supervision and 24 hour management of active medical problems listed below. Physiatrist and rehab team continue to assess barriers to discharge/monitor patient progress toward functional and medical goals  Care Tool:  Bathing    Body parts bathed by patient: Chest, Abdomen, Right upper leg, Left upper leg, Face   Body parts bathed by helper: Left lower leg, Right lower leg, Front perineal area, Buttocks, Right arm, Left arm     Bathing assist Assist Level: Maximal Assistance - Patient 24 - 49%     Upper Body Dressing/Undressing Upper body dressing   What is the patient wearing?: Pull over shirt    Upper body assist Assist Level: Moderate Assistance - Patient 50 - 74%    Lower Body Dressing/Undressing Lower body dressing      What is the patient wearing?: Pants     Lower body assist Assist for lower body dressing: Maximal Assistance - Patient 25 - 49%     Toileting Toileting    Toileting assist Assist for toileting: Total Assistance - Patient < 25%     Transfers Chair/bed transfer  Transfers assist     Chair/bed transfer  assist level: Maximal Assistance - Patient 25 - 49%     Locomotion Ambulation   Ambulation assist      Assist level: Maximal Assistance - Patient 25 - 49%   Max distance: 2'   Walk 10 feet activity   Assist  Walk 10 feet activity did not occur: Safety/medical concerns        Walk 50 feet activity   Assist Walk 50 feet with 2 turns activity did not occur: Safety/medical concerns         Walk 150 feet activity   Assist Walk 150 feet activity did not occur: Safety/medical concerns         Walk 10 feet on uneven surface  activity   Assist Walk 10 feet on uneven surfaces activity did not occur: Safety/medical concerns         Wheelchair     Assist Is the patient using a wheelchair?: Yes Type of Wheelchair: Manual     Wheelchair assist level: Supervision/Verbal cueing Max wheelchair distance: 150'    Wheelchair 50 feet with 2 turns activity    Assist        Assist Level: Supervision/Verbal cueing   Wheelchair 150 feet activity     Assist      Assist Level: Supervision/Verbal cueing   Blood pressure (!) 140/79, pulse 99, temperature 98.3 F (36.8 C), temperature source Oral, resp. rate 16, height 5\' 3"  (1.6 m), weight 70.4 kg, last menstrual period 01/16/2016, SpO2 99 %.  Medical Problem List and Plan: 1. 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  Antithrombotics: -DVT/anticoagulation:  Pharmaceutical: Heparin- will try and change to Lovenox- as long as labs in AM are OK -> HgB stable, Cr WNL, switch to Lovenox inj 40 mg daily              -antiplatelet therapy: N/A 3. Pain Management: Lidoderm patch, Tylenol as needed  - Reports chronic neck pain with Percocet 10 mg at home; was getting Oxy 5 mg Q6H PRN per admission notes; Oxy 2.5 mg Q6H PRN, will wean as tolerated. Cannot prescribe pain medication on discharge d/t documented history of snorting oxycodone; patient aware.   - States she gets gabapentin + Lyrica for peripheral neuropathy at home; not currently bothersome; monitor  1/20- will change bed to air mattress ot help with back pain.   1/22 - reverted back to regular mattress d.t increase back pain  4. Mood/Behavior/Sleep:              -antipsychotic agents: Clonazepam 0.5 mg 3 times daily, melatonin 3 mg nightly  - Consider air mattress if back pain/positioning severely limiting sleep  1/20- made changes today 5. Neuropsych/cognition: This patient is capable of making decisions on her own behalf.  - Per Rec therapy recs, neuropsych consulted. Assessment pending.   6. Skin/Wound Care Stage II L inner buttock  pressure ulcer- from [place]; tracheostomy opening - will do foam dressing and monitor clinically -stop using Purewick- explained to pt. - Routine skin checks - tracheostomy site; healed, open to air  7. Fluids/Electrolytes/Nutrition: Routine in and outs with follow-up chemistries - Mild low K on admission labs; added K dur 20 meq 1 tab BID; still low  increase KCL to  - 1/15: K 3.3; poor PO with intermittent refusals; switch to K powder 40 meq BID for 3 days and re-test  - 1/16: DC K phos d/t NPO; labs pending  - 1/17: Had 80 meq IV K overnight for 2.9; 3.3 this AM. Repeat IV 80 meq today with labs in AM. Phos and Mag WNL, no repletion needed. Will resume clear liquid diet today per GI and given maintenance fluids 75 cc/hr NS for 12 hours.   - 1/18: Had 20 meq IV K + 80 meq PO K yesterday, with improvement of K to 3.6 this AM. Continue on 40 meq BID regimen and recheck in AM to ensure no further drop.   - 1/19: K repleted; continue 40 meq BID PO and re-test Monday  1/22: K 3.7; appropriate, continue current repletion.   - Ca elevated but stable, monitor - Low protein/albumin, adding Boost supplement TID for nutrition and wound healing  - 1/19: Advance diet to Dys 3 with thins - 1/22: Hyponatremia 129 today; on IVF 75 cc/hr, will DC  8.  Respiratory failure/tracheostomy.  Decannulated 1 week ago- no air leak.  Check oxygen saturations every shift- off O2- current sats 96-97% - Admission: Coarse cough with light green sputum- will check f/u CXR  - 1/11 CXR: Multifocal airspace consolidation throughout the left lung appears stable. R lung clear. Vitals stable on RA. Continue current management.    1/14- WBC up to 17- per #15  1/14: WBC downtrending; likely source UTI  1/17: Afebrile, vitals stable, WBC continues downtrend on UTI Tx. Mild sputum production, complex pathology on CT abdomen but appears stable from prior images. Low suspicion for recurrent pneumonia, monitor closely   9.   AKI.  Resolved.  Latest creatinine 0.42.  Follow-up chemistries - Cr 0.5 this AM; WNL; encourage fluids with energy expenditure and follow  1/14- labs in AM  1/15: Cr stable 0.4; on IVF 75/hr for 24 hours; encouraged patient to drink 6x 250 cc water cups today given increased hydration needs with infection  1/16: DC IVF, labs and vitals stable  1/17: Resume IVF today as above; NPO yesterday; Cr stable  1/19: Dced IV abx, fluids; now tolerating PO. Labs   1/22: Continue IV compazine PRN for nausea, DC IVF d/t hyponatremia; recheck in AM.   10.  History of tobacco polysubstance use.  Provide counseling  11.  Prediabetes.  Hemoglobin A1c 6.1.  Blood sugar checks discontinued  12.  Dysphagia.  Dysphagia #2 thin liquids.  Follow-up speech therapy  1/16: NPO except sips with meds and ice chips d/t concern for colitis, awaiting GI eval  1/17: Clear liquid diet today, NPO at midnight for scope.   1/18: Cleared by GI to resume diet  - Dys 3 with thins 1/19  13. Constipation- resolved Last recorded BM 2 d ago , will give glycerin supp, await KUB  1/14- had moderate stool burden- given SMOG enema last night- pt having bright red clots since then  1/15: Educated patient on intermittent refusals of bowel meds  1/17: Continue BID miralax 17 g per GI recs; bowel PREP today for scope in AM  1/18: BID miralax per GI; had multiple enemas for bowel prep  1/20- no BM in 3 days- said miralax and Sorbitol make her nauseated- will try breaking up sorbitol and giving compazine prn to help- if not, can try Lactulose if need be, but that can also make pt's nauseated as well  - 1/22: Amitiza  per home regimen, suppository PRN pending KUB results today. Resume miralax BID when tolerating POs.   14. BRBPR w/ anemia, colitis  1/14- Hb is actually up from 9.1 to 11- so think blood loss is negligible- however will make sure has labs in AM as well.   1/15: HgB 9.5; H/H repeat ordered in AM d/t likely hemoconcentrated  with prior 11 and no further BRBPR   1/16: 2x liquis red mucusy stools overnight. KUB 1/14 with ?colitis. Patient made NPO, awaiting GI evaluation, appreciate input. Crp, CMP, and CBC w diff ordered.  Remains N/V with PO Zofran and IV compazine Prn available.   1/17: GI consulted, appreciate their expertise and input. CT abdomen overnight with results showing ?rectosigmoid colitis. Unlikely infectious. Cleared for clear liquid diet today, patient to have colonoscopy vs. Flex sigmoidoscopy in AM; +/- EGD depending on iron panel.   1/18: See procedure note. Nonspecific colitis observed on flex sig. Biopsies pending. Per GI, cleared for PO diet with gentle miralax 17 g BID bowel regimen. Iron panel with low TIBC, high ferritin = likely anemia chronic disease, low suspicion for acute bleed.    15. Leukocytosis - Uti +/- colitis - improving  1/14- WBC up to 17k from 11k- will check U/A and Cx but if that's negative, think the significant pleural effusion/multiloculated cosolidation on L lung could be getting infected- if need be, wil call ID to discuss- cannot see if this was treatment in [facility] after 1/1 when WBC went up to [facility]  1/15: See above; likely UTI started Keflex 500 mg q8 hours and since not voiding a lot, will start NS IVFs 75cc hour for 1 day -> switched to Rocephin 1 G daily d/t poor PO tolerance, plan to switch back to PO once improved  1/16: Continue IV abx d/t NPO, N/V; awaiting sensitivities  1/17: WBC improving; continue IV Abx until post-scope and then advance to PO   1/18: Out to procedure today, switch to PO tomorrow AM with labs  1/19: Switched to ampicillin 500 mg TID PO for 3 days; WBC WNL - finished regimen  1/22: Mildly increase WBC today, will recheck in AM and if increasing further and/or fever will do full infectious workup with CXR, respiratory culture, repeat UA, Bcx2  17. Tachycardia - mild , asymptomatic  Vitals:   02/16/22 1927 02/17/22 0424  BP: 135/86 (!) 140/79   Pulse: (!) 101 99  Resp: 14 16  Temp: 98.2 F (36.8 C) 98.3 F (36.8 C)  SpO2: 98% 99%      LOS: 11 days A FACE TO FACE EVALUATION WAS PERFORMED  Gertie Gowda 02/17/2022, 12:53 PM

## 2022-02-17 NOTE — Progress Notes (Signed)
Occupational Therapy Note  Patient Details  Name: Janet Hensley MRN: [id number] Date of Birth: 1970/05/04  Today's Date: 02/17/2022 OT Missed Time: 41 Minutes Missed Time Reason: Patient fatigue;Patient ill (comment) (nausea and abdominal pain)  Pt greeted semi-reclined in bed. Pt reported her abdominal pain and discomfort was too high to get out of bed. Pt left semi-reclined in bed with bed alarm on, call bell in reach, and needs met.    [name] [name] 02/17/2022, 1:03 PM

## 2022-02-17 NOTE — Progress Notes (Signed)
SLP Cancellation Note  Patient Details Name: Janet Hensley MRN: [id number] DOB: 1971/01/05   Cancelled treatment:       Patient missed 60 minutes of skilled SLP intervention due to not feeling well. Patient reporting sharp pains in her abdomen with nausea. Nursing aware. Will re-attempt as able. Continue with current plan of care.                                                                                                 [name] 02/17/2022, 12:43 PM

## 2022-02-17 NOTE — Progress Notes (Signed)
Patient ID: Janet Hensley, female   DOB: [birth year], 52 y.o.   MRN: [id number]  SW received call from pt husband reporting family education on Friday 1pm-4pm with him and her dtr. States he cannot rely on her brother to assist with care needs and plans to have someone to sit with her.   Loralee Pacas, MSW, [place] Office: [phone] Cell: [phone] Fax: [phone]

## 2022-02-17 NOTE — Progress Notes (Signed)
Physical Therapy Session Note  Patient Details  Name: Janet Hensley MRN: [id number] Date of Birth: 02-03-1970  Today's Date: 02/17/2022 PT Individual Time: 1430-1515 PT Individual Time Calculation (min): 45 min   Short Term Goals: Week 1:  PT Short Term Goal 1 (Week 1): Pt will complete bed mobility with minA consistently. PT Short Term Goal 1 - Progress (Week 1): Progressing toward goal PT Short Term Goal 2 (Week 1): Pt will complete bed to chair transfer with minA consistently PT Short Term Goal 2 - Progress (Week 1): Progressing toward goal PT Short Term Goal 3 (Week 1): Pt will ambulate x15' with modA and LRAD. PT Short Term Goal 3 - Progress (Week 1): Progressing toward goal Week 2:  PT Short Term Goal 1 (Week 2): Pt will perform bed mobility with minA consistently. PT Short Term Goal 2 (Week 2): Pt will perform bed to chair transfer with minA consistently. PT Short Term Goal 3 (Week 2): Pt will ambulate x15' with minA and LRAD.  Skilled Therapeutic Interventions/Progress Updates:  Patient greeted supine in bed and agreeable to PT treatment session- Patient reported feeling constipated this afternoon with stomach pains. Patient agreeable to performing activity in the room so she can stay near the bathroom. Patient transitioned from supine to sitting EOB with SBA and use of bed rail. While sitting EOB, patient donned lotion and socks in figure four positioning. Patient performed LE therex in order to increase strength and ROM- B LAQ with 1.5#, 3 x 15 B Hip flexion with 1.5#, 2 x 10  Patient tasked with performing x5 sit/stands from elevated EOB with RW and MinA- Therapist placed an arm chair in front of patient's RW for external visual cues regarding improved anterior weight shift. Patient was only able to complete one stand prior to transport arriving to take patient to xray. Therapist to come back later this afternoon if patient is willing and scheduling permits to make up missed 15  minutes of treatment session.    Therapy Documentation Precautions:  Precautions Precautions: Fall Restrictions Weight Bearing Restrictions: No   Therapy/Group: Individual Therapy  Harjas Biggins 02/17/2022, 7:40 AM

## 2022-02-17 NOTE — Progress Notes (Signed)
Occupational Therapy Session Note  Patient Details  Name: Mylinh C Luckenbaugh MRN: [id number] Date of Birth: [birth year]  Today's Date: 02/17/2022 OT Individual Time: 2035-5974 OT Individual Time Calculation (min): 15 min  and Today's Date: 02/17/2022 OT Missed Time: 30 Minutes Missed Time Reason: Patient fatigue;Patient ill (comment) (nausea and stomach pain)   Short Term Goals: Week 2:  OT Short Term Goal 1 (Week 2): Patient will complete toilet transfer with mod A of 1 OT Short Term Goal 2 (Week 2): Patient will stand at the sink with mod A of 1 in preparation for BADL Task. OT Short Term Goal 3 (Week 2): Patient will achieve figure 4 poisiton with min A in preparation for LB dressing and bathing tasks  Skilled Therapeutic Interventions/Progress Updates:    Pt greeted semi-reclined in bed asleep. OT able to wake pt but she reported nausea and stomach pain again. Pt reported she did not think she could get up. OT educated on importance of mobility for bowel function, gave options for BADL tasks and offered commode transfer. Pt continued to decline despite max encouragement. Pt agreeable to bed level UB there-ex, only 2 sets 10 cross body punch. Pt left semi-reclined in bed with bed alarm on, call bell in reach, and needs met.   Therapy Documentation Precautions:  Precautions Precautions: Fall Restrictions Weight Bearing Restrictions: No General: General OT Amount of Missed Time: 30 Minutes Vital Signs:  Pain: Pt reported stomach pain, no number given. Rest and respositioned   Therapy/Group: Individual Therapy  Valma Cava 02/17/2022, 8:55 AM

## 2022-02-17 NOTE — Progress Notes (Signed)
Occupational Therapy Session Note  Patient Details  Name: Janet Hensley MRN: [id number] Date of Birth: December 02, 1970  Today's Date: 02/17/2022 OT Individual Time: 1330-1400 OT Individual Time Calculation (min): 30 min    Short Term Goals: Week 2:  OT Short Term Goal 1 (Week 2): Patient will complete toilet transfer with mod A of 1 OT Short Term Goal 2 (Week 2): Patient will stand at the sink with mod A of 1 in preparation for BADL Task. OT Short Term Goal 3 (Week 2): Patient will achieve figure 4 poisiton with min A in preparation for LB dressing and bathing tasks  Skilled Therapeutic Interventions/Progress Updates:    Patient agreeable to participate in OT session. Reports abdominal pain from lack of BM. Reports no BM since 1/18.  Patient participated in skilled OT session focusing on colon massage to improve bowel mobility. Therapist educated pt on massage technique and benefits in order to facilitate bowel movement, decrease pain, discomfort and constipation. While supine in bed, pt completed exercises to increase bowel mobility including bilateral knees bent, side to side movement, 20X; Heel slides alternating, 20X  Therapy Documentation Precautions:  Precautions Precautions: Fall Restrictions Weight Bearing Restrictions: No   Therapy/Group: Individual Therapy  Ailene Ravel, OTR/L,CBIS  Supplemental OT - [facility] and [facility] Secure Chat Preferred   02/17/2022, 7:55 AM

## 2022-02-18 ENCOUNTER — Encounter: Payer: Self-pay | Admitting: Gastroenterology

## 2022-02-18 ENCOUNTER — Inpatient Hospital Stay (HOSPITAL_COMMUNITY): Payer: 59

## 2022-02-18 DIAGNOSIS — G7281 Critical illness myopathy: Secondary | ICD-10-CM | POA: Diagnosis not present

## 2022-02-18 LAB — CBC WITH DIFFERENTIAL/PLATELET
Abs Immature Granulocytes: 0.11 10*3/uL — ABNORMAL HIGH (ref 0.00–0.07)
Basophils Absolute: 0.1 10*3/uL (ref 0.0–0.1)
Basophils Relative: 1 %
Eosinophils Absolute: 0.5 10*3/uL (ref 0.0–0.5)
Eosinophils Relative: 5 %
HCT: 31.5 % — ABNORMAL LOW (ref 36.0–46.0)
Hemoglobin: 10.2 g/dL — ABNORMAL LOW (ref 12.0–15.0)
Immature Granulocytes: 1 %
Lymphocytes Relative: 22 %
Lymphs Abs: 2.2 10*3/uL (ref 0.7–4.0)
MCH: 31.3 pg (ref 26.0–34.0)
MCHC: 32.4 g/dL (ref 30.0–36.0)
MCV: 96.6 fL (ref 80.0–100.0)
Monocytes Absolute: 1 10*3/uL (ref 0.1–1.0)
Monocytes Relative: 10 %
Neutro Abs: 6.3 10*3/uL (ref 1.7–7.7)
Neutrophils Relative %: 61 %
Platelets: 468 10*3/uL — ABNORMAL HIGH (ref 150–400)
RBC: 3.26 MIL/uL — ABNORMAL LOW (ref 3.87–5.11)
RDW: 14.8 % (ref 11.5–15.5)
WBC: 10.1 10*3/uL (ref 4.0–10.5)
nRBC: 0 % (ref 0.0–0.2)

## 2022-02-18 LAB — COMPREHENSIVE METABOLIC PANEL
ALT: 16 U/L (ref 0–44)
AST: 14 U/L — ABNORMAL LOW (ref 15–41)
Albumin: 2.3 g/dL — ABNORMAL LOW (ref 3.5–5.0)
Alkaline Phosphatase: 120 U/L (ref 38–126)
Anion gap: 8 (ref 5–15)
BUN: 8 mg/dL (ref 6–20)
CO2: 21 mmol/L — ABNORMAL LOW (ref 22–32)
Calcium: 10.5 mg/dL — ABNORMAL HIGH (ref 8.9–10.3)
Chloride: 101 mmol/L (ref 98–111)
Creatinine, Ser: 0.57 mg/dL (ref 0.44–1.00)
GFR, Estimated: >60 mL/min (ref >60)
Glucose, Bld: 97 mg/dL (ref 70–99)
Potassium: 3.9 mmol/L (ref 3.5–5.1)
Sodium: 130 mmol/L — ABNORMAL LOW (ref 135–145)
Total Bilirubin: 0.4 mg/dL (ref 0.3–1.2)
Total Protein: 6.6 g/dL (ref 6.5–8.1)

## 2022-02-18 LAB — SURGICAL PATHOLOGY

## 2022-02-18 MED ORDER — IPRATROPIUM-ALBUTEROL 0.5-2.5 (3) MG/3ML IN SOLN
3.0000 mL | Freq: Four times a day (QID) | RESPIRATORY_TRACT | Status: DC | PRN
  Administered 2022-02-18 – 2022-02-27 (×10): 3 mL via RESPIRATORY_TRACT
  Filled 2022-02-18 (×10): qty 3

## 2022-02-18 MED ORDER — DOCUSATE SODIUM 100 MG PO CAPS
100.0000 mg | ORAL_CAPSULE | Freq: Two times a day (BID) | ORAL | Status: DC
  Administered 2022-02-18 – 2022-02-28 (×21): 100 mg via ORAL
  Filled 2022-02-18 (×21): qty 1

## 2022-02-18 NOTE — Progress Notes (Signed)
Physical Therapy Session Note  Patient Details  Name: Janet Hensley MRN: [id number] Date of Birth: [birth year]  Today's Date: 02/18/2022 PT Individual Time: 1st Treatment Session: 0845-1000; 2nd Treatment Session: 1415-1530 PT Individual Time Calculation (min): 75 min; 75 min  Short Term Goals: Week 2:  PT Short Term Goal 1 (Week 2): Pt will perform bed mobility with minA consistently. PT Short Term Goal 2 (Week 2): Pt will perform bed to chair transfer with minA consistently. PT Short Term Goal 3 (Week 2): Pt will ambulate x15' with minA and LRAD.  Skilled Therapeutic Interventions/Progress Updates:  Patient greeted supine in bed with NT present changing soiled brief- Patient agreeable to PT treatment session. Patient rolled L and R with use of bed rails and SBA while NT performed pericare for urinary episode and changed brief. While supine, patient threaded pants with set-up assistance. Patient transitioned from supine (flat bed) to sitting EOB with use of bed rail and CGA/MinA for initiating righting trunk. While sitting EOB, patient donned shirt, socks and shoes with set-up assistance and assistance tying shoelaces. Patient performed sit/stand from elevated bed with RW and MinA- While standing, patient pulled pants over hips with MinA. Patient performed stand pivot transfer to wheelchair with RW and Min/ModA with VC for improved sequencing and pursed lip breathing. Patient sat at the sink to brush her teeth and wash her face independently. Patient wheeled to rehab gym for time management and energy conservation.   Patient performed sit/stands in rehab gym with RW and Min/ModA- VC for increased anterior weight shift throughout stand for improved ease with transfer. Patient rocked forward x3 in order to gain momentum for standing.   Patient gait trained x55' and x60' with RW and [place] providing wheelchair follow for safety. VC for increased BOS, improved hip extension and pursed lip  breathing. Patient at times with increased anxiety throughout gait trials and with exertion, however with VC for pursed lip breathing patient is able to regulate her breath rate and ring her HR down.   Patient returned to her room sitting upright in wheelchair with tray table in front, call bell within reach and all needs met.    2nd Treatment Session- Patient greeted supine in bed and eventually agreeable to PT treatment session after encouragement and education. Patient transitioned from semi-reclined to sitting EOB with MinA for righting trunk. While sitting EOB, patient was able to don tennis shoes and therapist tied them. Patient stood from elevated bed with RW and MinA- Patient performed stand pivot transfer to wheelchair with CGA/MinA with VC for improved safety awareness and stepping all the way back to the wheelchair. Patient wheeled to rehab gym for time management and energy conservation.   Patient performed sit/stand from wheelchair with RW and ModA- Once standing patient required VC for improved balance and placing the RW on the floor for improved anterior weight shift. Patient then performed stand pivot transfer to/ from wheelchair and EOM with RW and MinA. Patient then transitioned from sitting to supine with SBA.   Supine therex performed in order to increased B LE strength and ROM for improved functional mobility- B SAQ with 3#, 3 x 10 B ankle pumps, 3 x 20 [place], 3 x 10  Patient returned to her room where she performed sit/stand from wheelchair with RW and [place]- Patient then performed stand pivot to EOB with appropriate safety awareness and reached back with UE for the bed. Patient transitioned from sitting to supine with use of bed rail  and SBA. Patient left supine in bed with bed alarm on, call bell within reach and all needs met.    Therapy Documentation Precautions:  Precautions Precautions: Fall Restrictions Weight Bearing Restrictions: No   Therapy/Group:  Individual Therapy  Shayon Trompeter 02/18/2022, 7:54 AM

## 2022-02-18 NOTE — Progress Notes (Signed)
PROGRESS NOTE   Subjective/Complaints:  Seen in therapies this AM, walked 50 ft CGA with walker. Feeling nausea is improving, is drinking prune juice this AM, feels B< with suppository overnight was inadequate. Otherwise, no complaints.  Notable urinary incontinence new since 1/16, staff states patient feels urge to go and can hold for toileting if needed, but prefers to be incontinent due to fatigue. Will work on encouraging timed toileting compliance.   Also asking for breathing treatment later this AM with OT, states chest feels tight.   ROS: + N/V/C; + SOB; + incontinence; Denies fevers, chills, diarrhea, cough, chest pain, new weakness or paraesthesias.     Objective:   DG Abd 1 View  Result Date: 02/17/2022 CLINICAL DATA:  Constipation EXAM: ABDOMEN - 1 VIEW COMPARISON:  02/09/2022 FINDINGS: Scattered normal stool burden in colon. No bowel dilatation or bowel wall thickening. Air-filled nondistended small bowel loops in mid abdomen and pelvis. No acute osseous findings or definite urinary tract calcification. Pelvic phleboliths noted. IMPRESSION: Unremarkable bowel gas pattern. Electronically Signed   By: Ulyses Southward M.D.   On: 02/17/2022 15:41   Recent Labs    02/17/22 0611 02/18/22 0800  WBC 12.6* 10.1  HGB 10.3* 10.2*  HCT 31.4* 31.5*  PLT 456* 468*    Recent Labs    02/17/22 0611 02/18/22 0800  NA 129* 130*  K 3.7 3.9  CL 100 101  CO2 23 21*  GLUCOSE 104* 97  BUN 8 8  CREATININE 0.66 0.57  CALCIUM 10.7* 10.5*    No intake or output data in the 24 hours ending 02/18/22 1043       Physical Exam: Vital Signs Blood pressure 132/78, pulse 95, temperature 98.2 F (36.8 C), resp. rate 17, height 5\' 3"  (1.6 m), weight 70.4 kg, last menstrual period 01/16/2016, SpO2 97 %.   General: No acute distress   Mood and affect are appropriate Heart: Regular rate and rhythm no rubs murmurs or extra sounds Lungs:  Clear to auscultation, breathing unlabored, no rales or wheezes. On RA> No cough on exam.  Abdomen: Positive bowel sounds, hypoactive, nontender, , nondistended Extremities: No clubbing, cyanosis, or edema Skin: No evidence of breakdown, no evidence of rash. +tracheostomy site closed, open to air.  + buttocks skin tear with aquagel, not examined   Neurological: AAOx3. Appropriate.  Musculoskeletal: All 4 limbs antigravity and against resitance. BL LE ankles PROM to neutral. No spasticity.    Assessment/Plan: 1. Functional deficits which require 3+ hours per day of interdisciplinary therapy in a comprehensive inpatient rehab setting. Physiatrist is providing close team supervision and 24 hour management of active medical problems listed below. Physiatrist and rehab team continue to assess barriers to discharge/monitor patient progress toward functional and medical goals  Care Tool:  Bathing    Body parts bathed by patient: Chest, Abdomen, Right upper leg, Left upper leg, Face   Body parts bathed by helper: Left lower leg, Right lower leg, Front perineal area, Buttocks, Right arm, Left arm     Bathing assist Assist Level: Maximal Assistance - Patient 24 - 49%     Upper Body Dressing/Undressing Upper body dressing   What is the  patient wearing?: Pull over shirt    Upper body assist Assist Level: Moderate Assistance - Patient 50 - 74%    Lower Body Dressing/Undressing Lower body dressing      What is the patient wearing?: Pants     Lower body assist Assist for lower body dressing: Maximal Assistance - Patient 25 - 49%     Toileting Toileting    Toileting assist Assist for toileting: Total Assistance - Patient < 25%     Transfers Chair/bed transfer  Transfers assist     Chair/bed transfer assist level: Maximal Assistance - Patient 25 - 49%     Locomotion Ambulation   Ambulation assist      Assist level: Maximal Assistance - Patient 25 - 49%   Max  distance: 2'   Walk 10 feet activity   Assist  Walk 10 feet activity did not occur: Safety/medical concerns        Walk 50 feet activity   Assist Walk 50 feet with 2 turns activity did not occur: Safety/medical concerns         Walk 150 feet activity   Assist Walk 150 feet activity did not occur: Safety/medical concerns         Walk 10 feet on uneven surface  activity   Assist Walk 10 feet on uneven surfaces activity did not occur: Safety/medical concerns         Wheelchair     Assist Is the patient using a wheelchair?: Yes Type of Wheelchair: Manual    Wheelchair assist level: Supervision/Verbal cueing Max wheelchair distance: 150'    Wheelchair 50 feet with 2 turns activity    Assist        Assist Level: Supervision/Verbal cueing   Wheelchair 150 feet activity     Assist      Assist Level: Supervision/Verbal cueing   Blood pressure 132/78, pulse 95, temperature 98.2 F (36.8 C), resp. rate 17, height 5\' 3"  (1.6 m), weight 70.4 kg, last menstrual period 01/16/2016, SpO2 97 %.  Medical Problem List and Plan: 1. 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  Antithrombotics: -DVT/anticoagulation:  Pharmaceutical: Heparin- will try and change to Lovenox- as long as labs in AM are OK -> HgB stable, Cr WNL, switch to Lovenox inj 40 mg daily              -antiplatelet therapy: N/A 3. Pain Management: Lidoderm patch, Tylenol as needed - stable  - Reports chronic neck pain with Percocet 10 mg at home; was getting Oxy 5 mg Q6H PRN per admission notes; Oxy 2.5 mg Q6H PRN, will wean as tolerated. Cannot prescribe pain medication on discharge d/t documented history of snorting oxycodone; patient aware.   - States she gets gabapentin + Lyrica for peripheral neuropathy  at home; not currently bothersome; monitor  1/20- will change bed to air mattress ot help with back pain.   1/22 - reverted back to regular mattress d.t increase back pain  4. Mood/Behavior/Sleep:              -antipsychotic agents: Clonazepam 0.5 mg 3 times daily, melatonin 3 mg nightly  1/23: Encourage compliance with timed toiletting due to reported  incontinence d/t fatigue, unwilling to get OOB  5. Neuropsych/cognition: This patient is capable of making decisions on her own behalf.  - Per Rec therapy recs, neuropsych consulted. Assessment pending.   6. Skin/Wound Care Stage II L inner buttock pressure ulcer- from [place]; tracheostomy opening - will do foam dressing and monitor clinically -stop using Purewick- explained to pt. Incontinence as above.  - Routine skin checks - tracheostomy site; healed, open to air  7. Fluids/Electrolytes/Nutrition: Routine in and outs with follow-up chemistries - Mild low K on admission labs; added K dur 20 meq 1 tab BID; still low increase KCL to 45meq  - 1/15: K 3.3; poor PO with intermittent refusals; switch to K powder 40 meq BID for 3 days and re-test  - 1/16: DC K phos d/t NPO; labs pending  - 1/17: Had 80 meq IV K overnight for 2.9; 3.3 this AM. Repeat IV 80 meq today with labs in AM. Phos and Mag WNL, no repletion needed. Will resume clear liquid diet today per GI and given maintenance fluids 75 cc/hr NS for 12 hours.   - 1/18: Had 20 meq IV K + 80 meq PO K yesterday, with improvement of K to 3.6 this AM. Continue on 40 meq BID regimen and recheck in AM to ensure no further drop.   - 1/19: K repleted; continue 40 meq BID PO and re-test Monday  1/22: K 3.7; appropriate, continue current repletion.   1/23: K 3.9. Na improved from 129->130 off of IV fluids. Encourage improved POs and repeat labs Friday.   - Ca elevated but stable, monitor - Low protein/albumin, adding Boost supplement TID for nutrition and wound healing   8.  Respiratory  failure/tracheostomy.  Decannulated 1 week ago- no air leak.  Check oxygen saturations every shift- off O2- current sats 96-97% - Admission: Coarse cough with light green sputum- will check f/u CXR  - 1/11 CXR: Multifocal airspace consolidation throughout the left lung appears stable. R lung clear. Vitals stable on RA. Continue current management.    1/14- WBC up to 17- per #15  1/14: WBC downtrending; likely source UTI  1/17: Afebrile, vitals stable, WBC continues downtrend on UTI Tx. Mild sputum production, complex pathology on CT abdomen but appears stable from prior images. Low suspicion for recurrent pneumonia, monitor closely  1/23: Duonebs Q8h PRN for SOB added. CXR today for subjective SOB; given improved WBC stable at 10-12 low suspicion for recurrent PNA  9.  AKI.  Resolved.  Latest creatinine 0.42.  Follow-up chemistries - Cr 0.5 this AM; WNL; encourage fluids with energy expenditure and follow  1/14- labs in AM  1/15: Cr stable 0.4; on IVF 75/hr for 24 hours; encouraged patient to drink 6x 250 cc water cups today given increased hydration needs with infection  1/16: DC IVF, labs and vitals stable  1/17: Resume IVF today as above; NPO yesterday; Cr stable  1/19: Dced IV abx, fluids; now tolerating PO. Labs   1/22: Continue IV compazine PRN for nausea, DC IVF d/t hyponatremia; recheck in AM.   10.  History of tobacco polysubstance use.  Provide counseling  11.  Prediabetes.  Hemoglobin A1c 6.1.  Blood sugar checks discontinued  12.  Dysphagia.  Dysphagia #2 thin liquids.  Follow-up speech therapy - stable  1/16: NPO except sips with meds and ice chips d/t concern for colitis, awaiting GI eval  1/17: Clear liquid diet today, NPO at midnight for scope.   1/18: Cleared by GI to resume  diet  - Dys 3 with thins 1/19  13. Constipation- ongoing Last recorded BM 2 d ago , will give glycerin supp, await KUB  1/14- had moderate stool burden- given SMOG enema last night- pt having bright  red clots since then  1/15: Educated patient on intermittent refusals of bowel meds  1/17: Continue BID miralax 17 g per GI recs; bowel PREP today for scope in AM  1/18: BID miralax per GI; had multiple enemas for bowel prep  1/20- no BM in 3 days- said miralax and Sorbitol make her nauseated- will try breaking up sorbitol and giving compazine prn to help- if not, can try Lactulose if need be, but that can also make pt's nauseated as well  - 1/22: Amitiza per home regimen, suppository PRN pending KUB showed scattered stool, no acute findings. Resume miralax BID when tolerating POs.   1/23: Encouraging POS, changed Miralax to Colace 100 mg BID, also drinking prune juice. Did have small BM with suppository overnight.   14. BRBPR w/ anemia, colitis - resolved  1/14- Hb is actually up from 9.1 to 11- so think blood loss is negligible- however will make sure has labs in AM as well.   1/15: HgB 9.5; H/H repeat ordered in AM d/t likely hemoconcentrated with prior 11 and no further BRBPR   1/16: 2x liquis red mucusy stools overnight. KUB 1/14 with ?colitis. Patient made NPO, awaiting GI evaluation, appreciate input. Crp, CMP, and CBC w diff ordered.  Remains N/V with PO Zofran and IV compazine Prn available.   1/17: GI consulted, appreciate their expertise and input. CT abdomen overnight with results showing ?rectosigmoid colitis. Unlikely infectious. Cleared for clear liquid diet today, patient to have colonoscopy vs. Flex sigmoidoscopy in AM; +/- EGD depending on iron panel.   1/18: See procedure note. Nonspecific colitis observed on flex sig. Biopsies pending. Per GI, cleared for PO diet with gentle miralax 17 g BID bowel regimen. Iron panel with low TIBC, high ferritin = likely anemia chronic disease, low suspicion for acute bleed.    15. Leukocytosis - Uti +/- colitis - improving  1/14- WBC up to 17k from 11k- will check U/A and Cx but if that's negative, think the significant pleural  effusion/multiloculated cosolidation on L lung could be getting infected- if need be, wil call ID to discuss- cannot see if this was treatment in [place] after 1/1 when WBC went up to [facility]  1/15: See above; likely UTI started Keflex 500 mg q8 hours and since not voiding a lot, will start NS IVFs 75cc hour for 1 day -> switched to Rocephin 1 G daily d/t poor PO tolerance, plan to switch back to PO once improved  1/16: Continue IV abx d/t NPO, N/V; awaiting sensitivities  1/17: WBC improving; continue IV Abx until post-scope and then advance to PO   1/18: Out to procedure today, switch to PO tomorrow AM with labs  1/19: Switched to ampicillin 500 mg TID PO for 3 days; WBC WNL - finished regimen  1/22: Mildly increase WBC today, will recheck in AM and if increasing further and/or fever will do full infectious workup with CXR, respiratory culture, repeat UA, Bcx2  1/23: WBC stable at 10-12; monitor  17. Tachycardia - mild , asymptomatic - resolved Vitals:   02/17/22 2026 02/18/22 0442  BP: (!) 142/78 132/78  Pulse: 99 95  Resp: 20 17  Temp: 98.1 F (36.7 C) 98.2 F (36.8 C)  SpO2: 100% 97%  LOS: 12 days A FACE TO FACE EVALUATION WAS PERFORMED  Gertie Gowda 02/18/2022, 10:43 AM

## 2022-02-18 NOTE — Progress Notes (Signed)
Patient ID: Janet Hensley, female   DOB: 12/24/1970, 52 y.o.   MRN: [id number]  SW went by pt room to provide updates from team conference, but pt was sleeping stating she was very tired. SW will follow-up.   Loralee Pacas, MSW, [place] Office: [phone] Cell: [phone] Fax: [phone]

## 2022-02-18 NOTE — Patient Care Conference (Signed)
Inpatient RehabilitationTeam Conference and Plan of Care Update Date: 02/18/2022   Time: 10:40 AM    Patient Name: Janet Hensley      Medical Record Number: [id number]  Date of Birth: March 11, 1970 Sex: Female         Room/Bed: 4W03C/4W03C-01 Payor Info: Payor: Holland Falling / Plan: [employer] [facility]  / Product Type: *No Product type* /    Admit Date/Time:  02/06/2022  5:50 PM  Primary Diagnosis:  Critical illness myopathy  Hospital Problems: Principal Problem:   Critical illness myopathy Active Problems:   Lower abdominal pain   Rectal bleeding   Anemia   Nausea and vomiting   Abnormal CT scan, gastrointestinal tract    Expected Discharge Date: Expected Discharge Date: 02/28/22  Team Members Present: Physician leading conference: Dr. Durel Salts Social Worker Present: Loralee Pacas, [place] Nurse Present: Tacy Learn, RN PT Present: Tereasa Coop, PT OT Present: Cherylynn Ridges, OT SLP Present: Weston Anna, SLP PPS Coordinator present : Gunnar Fusi, SLP     Current Status/Progress Goal Weekly Team Focus  Bowel/Bladder   Incontinent of b/b. LBM: 1/22 - suppository was given.   Regain continence of b/b.   Assist w/ toileting needs q 2-4 hours & PRN.    Swallow/Nutrition/ Hydration   Dys. 3 textures with thin liquids, Intermittent supervision   Mod I  tolerance of current diet, use of swallowing compensatory strategies, trials of regular textures    ADL's   min/mod A transfers w/ RW, Mod A UB and LB BADLs, has had set backs with pain and nausea this week   Supervision overall   activity tolerance, transfers, self-care retraining, UB strength    Mobility   SBA bed mobility with use of bed rail, Min/ModA for sit/stands and transfers with RW, Min/ModA for gait up to 62' with RW and wheelchair follow   Supervision  Global strengthening, endurance/activity tolerance, balance, transfers, gait    Communication                Safety/Cognition/ Behavioral  Observations  Min-Mod A   Supervision   complex problem solving and funtional recall    Pain   Pt c/o back pain rating it as a 7/10. Oxy given.   Keep pain level < 4/10   Assess pain q shift & PRN.    Skin   Incision throat site of stoma from trach - WNL (OTA);  redness to sacrum; bruising to bilat. lower abd & arms   Prevent further breakdown/issues of skin integrity.  Assess skin q shift & PRN      Discharge Planning:  Pt will d/c to home with her husband (seperated) who can provide evening support du eto work schedule 630am-4:30pm. States there will be PRN Support from her dtr, and he intends to have someone sit with her. Fam edu scheduled for Friday (1/26) 1pm-4pm with husband and dtr.  Once final recommendations are reported, will arrange. SW will confirm there are no barriers to discharge.   Team Discussion: Critical illness myopathy. Incontinent of urine. Time toileting. Refusing to get up and go to bathroom or use BSC. Continent of Bowel. Pain managed with PRN medication. Stoma healing. Skin tear to right buttocks. Abdominal pain over weekend with nausea. IV compazine provided with suppository. Small results. Making progress but refuses sometimes.  [place] for ADLs. Min/Mod A for transfers. SBA for bed mobility. Distracts easily.   Patient on target to meet rehab goals: yes, husband cleared to take patient to the  bathroom. Gait 54' with RW  *See Care Plan and progress notes for long and short-term goals.   Revisions to Treatment Plan:  Medication adjustments, monitor labs, monitor constipation Teaching Needs: Medications, safety, self care, gait/transfer training, skin care, diet modifications etc.  Current Barriers to Discharge: Decreased caregiver support, Incontinence, Lack of/limited family support, Weight, and Behavior  Possible Resolutions to Barriers: Family education, nursing education, order recommended DME     Medical Summary Current Status: medically  complicated by constipation w/ prior colitis, poor PO intakes, nausea, hyponatremia, hypokalemia,  uncontrolled back pain, respiratory failure, and urinary incontinence  Barriers to Discharge: Electrolyte abnormality;Inadequate Nutritional Intake;Incontinence;Medical stability;Self-care education;Uncontrolled Pain  Barriers to Discharge Comments: constipation, urinary incontinence, pain control, and inadequite PO intakes Possible Resolutions to [employer] Focus: Adjust bowel regimen for constipation, improve POs by managing nausea, replete electrolytes, monitor pain control with therapy participation   Continued Need for Acute Rehabilitation Level of Care: The patient requires daily medical management by a physician with specialized training in physical medicine and rehabilitation for the following reasons: Direction of a multidisciplinary physical rehabilitation program to maximize functional independence : Yes Medical management of patient stability for increased activity during participation in an intensive rehabilitation regime.: Yes Analysis of laboratory values and/or radiology reports with any subsequent need for medication adjustment and/or medical intervention. : Yes   I attest that I was present, lead the team conference, and concur with the assessment and plan of the team.   Ernest Pine 02/18/2022, 2:16 PM

## 2022-02-18 NOTE — Progress Notes (Signed)
Occupational Therapy Session Note  Patient Details  Name: Janet Hensley MRN: [id number] Date of Birth: October 08, 1970  Today's Date: 02/18/2022 OT Individual Time: 1105-1205 OT Individual Time Calculation (min): 60 min    Short Term Goals: Week 2:  OT Short Term Goal 1 (Week 2): Patient will complete toilet transfer with mod A of 1 OT Short Term Goal 2 (Week 2): Patient will stand at the sink with mod A of 1 in preparation for BADL Task. OT Short Term Goal 3 (Week 2): Patient will achieve figure 4 poisiton with min A in preparation for LB dressing and bathing tasks  Skilled Therapeutic Interventions/Progress Updates:    Pt greeted semi-reclined in  bed asleep. Pt initially did not want to get up, but agreeable with encouragement to get up to the wc. Pt needed 3 trials to stand from EOB with difficulty powering up. Pt needed max A to eventually stand all the way up and achieve full hip and trunk extension. Pt then ambulated 5 feet w/ RW, but felt like he legs were "jello."OT pulled wc behind pt and she needed mod A to sit safely. Pt brought into bathroom and needed mod A to get to standing using grab bar. Pt turned to [facility] over toilet with Mini A to pull down pants as pt's would not remove R hand from the bar to assist. Pt with successful BM and voided bladder. Worked on reaching to perform peri-care with set-up. Pt needed mod A from OT with pt pulling up on grab bar and reaching around OT's neck as this is how she does it with her husband. OT educated on other options to increase independence with stand, but pt too anxious and fatigued to try a diffierent way. Grooming tasks completed at the sink with supervision. OT propelled pt down to therapy gym and pt completed 5 minutes on [employer] arm bike for uB strength/endurance. Pt returned to room and agreeable to stay up in wc for 30 minutes. Alarm on, call bell in reach, and needs met.   Therapy Documentation Precautions:  Precautions Precautions:  Fall Restrictions Weight Bearing Restrictions: No General:   Vital Signs:   Pain:  Pt reported improved pain in   Therapy/Group: Individual Therapy  Valma Cava 02/18/2022, 11:58 AM

## 2022-02-19 DIAGNOSIS — G7281 Critical illness myopathy: Secondary | ICD-10-CM | POA: Diagnosis not present

## 2022-02-19 NOTE — Progress Notes (Signed)
Physical Therapy Session Note  Patient Details  Name: Janet Hensley MRN: [id number] Date of Birth: 06-Sep-1970  Today's Date: 02/19/2022 PT Individual Time: 1105-1200 PT Individual Time Calculation (min): 55 min   Short Term Goals: Week 2:  PT Short Term Goal 1 (Week 2): Pt will perform bed mobility with minA consistently. PT Short Term Goal 2 (Week 2): Pt will perform bed to chair transfer with minA consistently. PT Short Term Goal 3 (Week 2): Pt will ambulate x15' with minA and LRAD.  Skilled Therapeutic Interventions/Progress Updates:     Pt received seated in [facility] and agrees to therapy. No complaint of pain. WC transport to gym for time management. Pt performs LAQs prior to ambulation to prepare lower extremity muscles and joints for weight bearing. Pt attempts to stand and requires several attempts to successfully complete, requiring modA and cues for hand placement, initiation, sequencing, and powering up. Pt ambulates x90' with RW and minA, with cues for upright gaze to improve posture and balance, and decreasing WB through upper extremities for improved body mechanics and energy conservation. Pt takes extended seated rest break. Pt then stands again with modA and same cues, then ambulates x105' with RW and minA with +2 WC follow for safety.   Pt performs stair training on 6" steps with bilateral hand rails. PT provides demonstration of optimal stair navigation technique. Pt stands with modA, holding onto handrails. Pt requires manual assistance to clear step with L lower extremity, and modA overall to complete 1 6" step. Pt attempts 2nd step but is unable to push up with R lower extremity, so instead descends step backward to sit back in WC. Rest break.  Pt then completes standing toe taps on 3.5" step with RW. Performed for strengthening of hip flexors, as well as endurance. Pt completes x10 prior to rest break. WC transport back to room. Left seated with alarm intact and all needs within  reach.  Therapy Documentation Precautions:  Precautions Precautions: Fall Restrictions Weight Bearing Restrictions: No   Therapy/Group: Individual Therapy  Breck Coons, PT, DPT 02/19/2022, 5:10 PM

## 2022-02-19 NOTE — Progress Notes (Addendum)
Patient ID: Janet Hensley, female   DOB: 12/26/1970, 52 y.o.   MRN: [id number]  SW left message for pt husband Gerald Stabs with updates from team conference, and d/c date remains 2/2. SW reiterated family du on Friday 1pm-4pm, and he will have an opportunity to see her progress. SW encouraged follow-up if needed.   Loralee Pacas, MSW, [place] Office: [phone] Cell: [phone] Fax: [phone]

## 2022-02-19 NOTE — Progress Notes (Signed)
Physical Therapy Session Note  Patient Details  Name: Janet Hensley MRN: [id number] Date of Birth: 1970-02-06  Today's Date: 02/19/2022 PT Individual Time: 1st Treatment Session: 0800-0900; 2nd Treatment Session: 1405-1450 PT Individual Time Calculation (min): 60 min; 45 min  Short Term Goals: Week 2:  PT Short Term Goal 1 (Week 2): Pt will perform bed mobility with minA consistently. PT Short Term Goal 2 (Week 2): Pt will perform bed to chair transfer with minA consistently. PT Short Term Goal 3 (Week 2): Pt will ambulate x15' with minA and LRAD.  Skilled Therapeutic Interventions/Progress Updates:  1st Treatment Session- Patient greeted supine asleep in bed, but easily arousable and agreeable to PT treatment session. Patient requesting to shower this morning prior to starts of day. Patient transitioned from supine to sitting EOB with use of bed rail and MinA for righting trunk secondary to B UE weakness. While sitting EOB, patient donned hospital socks with set-up assistance. [place] performed sit/stand from elevated EOB with RW and MinA- Patient then ambulated ~20' from EOB to TTB in shower with RW and CGA. Patient was able to safely navigate over the threshold entrance into the bathroom with no LOB noted. While sitting on TTB, patient doffed brief, socks and shirt independently. Patient completed entire shower seated with set-up assistance for most of the shower and then MinA required for lifting LE into figure four positioning for bathing and washing/drying her back. While seated on the TTB, patient dried her body off with a towel and was able to don bra and shirt with set-up assistance. Therapist donned socks, shoes, brief and pants for time management. Patient stood from TTB with RW and ModA- While standing therapist pulled pants and brief over hips with CGA for safety. Patient ambulated ~15' to her wheelchair with RW and CGA/MinA. Patient left sitting upright in wheelchair in room with RN and  RN student present and all needs met.    2nd Treatment Session- Patient greeted supine in bed asleep, but easily arousable and agreeable to PT treatment session. Patient noted to not have eaten her lunch (therapist arrived around 1400)- When asked if she wanted to sit EOB and attempt to eat lunch patient was agreeable. Patient transitioned from supine in flat bed to sitting EOB with MinA for righting trunk secondary to B UE weakness. Patient sat EOB without UE support while eating lunch with set-up assistance. Patient requesting to use the restroom and was able to stand from elevated EOB with RW and MinA- Once standing, patient ambulated x20' to/from EOB and commode over toilet with RW and MinA. VC for improved postural extension and pursed lip breathing. While standing with CGA, therapist managed brief and pants. Patient was unsuccessful of BM, but did urinate. Patient stood from commode with RW and ModA with increased time required for hip extension secondary to weakness and fatigue. Patient ambulated back to her bed and performed x2 sit/stand from EOB with RW and MinA. Patient transitioned from sitting EOB to supine with SBA. Patient left supine in bed with bed alarm on, call bell within reach and all needs met.    Therapy Documentation Precautions:  Precautions Precautions: Fall Restrictions Weight Bearing Restrictions: No   Therapy/Group: Individual Therapy  Hassel Uphoff 02/19/2022, 7:55 AM

## 2022-02-19 NOTE — Progress Notes (Signed)
Speech Language Pathology Daily Session Note  Patient Details  Name: Helena C Tadros MRN: [id number] Date of Birth: 1970-03-26  Today's Date: 02/19/2022 SLP Individual Time: 3976-7341 SLP Individual Time Calculation (min): 46 min  Short Term Goals: Week 2: SLP Short Term Goal 1 (Week 2): Patient will recall new, daily information with supervision verbal and visual cues for use of compensatory strategies. SLP Short Term Goal 2 (Week 2): Patient will demonstrate functional problem solving for mildly complex tasks with Supervision verbal cues. SLP Short Term Goal 3 (Week 2): Patient will demonstrate efficient mastication with complete oral clearance with trials of regular textures without overt s/s of aspiration with Mod I over 2 sessions prior to upgrade.  Skilled Therapeutic Interventions: Skilled treatment session focused on cognitive goals. SLP facilitated session by providing extra time for patient to complete functional grooming tasks at the sink independently. SLP also provided supervision level verbal cues for recall of changes to her current medications and for anticipatory awareness regarding appropriate strategies to utilize to manage both scheduled and PRN medications at home. Will plan to organize a QD pill box during next session with focus on problem solving. Patient left upright in wheelchair with alarm on and all needs within reach. Continue with current plan of care.   Pain Pain Assessment Pain Scale: 0-10 Pain Score: 8  Pain Type: Acute pain Pain Location: Back Pain Orientation: Lower Pain Frequency: Constant Pain Onset: On-going Patients Stated Pain Goal: 3 Pain Intervention(s): Medication (See eMAR)  Therapy/Group: Individual Therapy  Adley Mazurowski 02/19/2022, 1:13 PM

## 2022-02-19 NOTE — Progress Notes (Signed)
Recreational Therapy Session Note  Patient Details  Name: Janet Hensley MRN: [id number] Date of Birth: Mar 08, 1970 Today's Date: 02/19/2022  Pt participated in animal assisted activity seated w/c level with supervision.  Pt appreciative of this visit.: [place] 02/19/2022, 1:28 PM

## 2022-02-19 NOTE — Progress Notes (Signed)
PROGRESS NOTE   Subjective/Complaints:  CXR yesterday overall improved but with ? Re-loculation of LL effusion; discussed with pulmonology, recommend IR evaluation. Patient with no increased sputum production or SOB; improved with duonebs. Small BM yesterday. PO tolerance much improved, no further n/v yesterday.   ROS:  Denies fevers, chills, diarrhea, cough, chest pain, new weakness or paraesthesias.     Objective:   DG CHEST PORT 1 VIEW  Result Date: 02/18/2022 CLINICAL DATA:  SOB EXAM: PORTABLE CHEST 1 VIEW COMPARISON:  02/06/2022 FINDINGS: The heart size and mediastinal contours are within normal limits. Diffuse parenchymal consolidation on the left has improved somewhat in the interval. Left-sided pleural effusion with loculation is again noted. No pneumothorax. Right lung clear. IMPRESSION: Diffuse parenchymal consolidation left hemithorax with some improvement in aeration compared to the prior study. Large left-sided pleural effusion which appears to be loculated again noted. Electronically Signed   By: Layla Maw M.D.   On: 02/18/2022 14:20   DG Abd 1 View  Result Date: 02/17/2022 CLINICAL DATA:  Constipation EXAM: ABDOMEN - 1 VIEW COMPARISON:  02/09/2022 FINDINGS: Scattered normal stool burden in colon. No bowel dilatation or bowel wall thickening. Air-filled nondistended small bowel loops in mid abdomen and pelvis. No acute osseous findings or definite urinary tract calcification. Pelvic phleboliths noted. IMPRESSION: Unremarkable bowel gas pattern. Electronically Signed   By: Ulyses Southward M.D.   On: 02/17/2022 15:41   Recent Labs    02/17/22 0611 02/18/22 0800  WBC 12.6* 10.1  HGB 10.3* 10.2*  HCT 31.4* 31.5*  PLT 456* 468*    Recent Labs    02/17/22 0611 02/18/22 0800  NA 129* 130*  K 3.7 3.9  CL 100 101  CO2 23 21*  GLUCOSE 104* 97  BUN 8 8  CREATININE 0.66 0.57  CALCIUM 10.7* 10.5*      Intake/Output Summary (Last 24 hours) at 02/19/2022 1317 Last data filed at 02/19/2022 0752 Gross per 24 hour  Intake 480 ml  Output --  Net 480 ml         Physical Exam: Vital Signs Blood pressure 123/72, pulse 92, temperature 97.9 F (36.6 C), resp. rate 16, height 5\' 3"  (1.6 m), weight 70.4 kg, last menstrual period 01/16/2016, SpO2 97 %.   General: No acute distress   Mood and affect are appropriate Heart: Regular rate and rhythm no rubs murmurs or extra sounds Lungs: Clear to auscultation, decreased in LLL. Cough with grey sputum production.   Abdomen: Positive bowel sounds, hypoactive, nontender, , nondistended Extremities: No clubbing, cyanosis, or edema. Moving all 4 limbs antigravity and against reisstance.  Skin: No evidence of breakdown, no evidence of rash. +tracheostomy site closed, open to air.  + buttocks skin tear with aquagel, not examined   Neurological: AAOx3. Appropriate.  Musculoskeletal: All 4 limbs antigravity and against resitance. BL LE ankles PROM to neutral. No spasticity.    Assessment/Plan: 1. Functional deficits which require 3+ hours per day of interdisciplinary therapy in a comprehensive inpatient rehab setting. Physiatrist is providing close team supervision and 24 hour management of active medical problems listed below. Physiatrist and rehab team continue to assess barriers to discharge/monitor patient progress toward  functional and medical goals  Care Tool:  Bathing    Body parts bathed by patient: Chest, Abdomen, Right upper leg, Left upper leg, Face   Body parts bathed by helper: Left lower leg, Right lower leg, Front perineal area, Buttocks, Right arm, Left arm     Bathing assist Assist Level: Maximal Assistance - Patient 24 - 49%     Upper Body Dressing/Undressing Upper body dressing   What is the patient wearing?: Pull over shirt    Upper body assist Assist Level: Moderate Assistance - Patient 50 - 74%    Lower Body  Dressing/Undressing Lower body dressing      What is the patient wearing?: Pants     Lower body assist Assist for lower body dressing: Maximal Assistance - Patient 25 - 49%     Toileting Toileting    Toileting assist Assist for toileting: Total Assistance - Patient < 25%     Transfers Chair/bed transfer  Transfers assist     Chair/bed transfer assist level: Maximal Assistance - Patient 25 - 49%     Locomotion Ambulation   Ambulation assist      Assist level: Maximal Assistance - Patient 25 - 49%   Max distance: 2'   Walk 10 feet activity   Assist  Walk 10 feet activity did not occur: Safety/medical concerns        Walk 50 feet activity   Assist Walk 50 feet with 2 turns activity did not occur: Safety/medical concerns         Walk 150 feet activity   Assist Walk 150 feet activity did not occur: Safety/medical concerns         Walk 10 feet on uneven surface  activity   Assist Walk 10 feet on uneven surfaces activity did not occur: Safety/medical concerns         Wheelchair     Assist Is the patient using a wheelchair?: Yes Type of Wheelchair: Manual    Wheelchair assist level: Supervision/Verbal cueing Max wheelchair distance: 150'    Wheelchair 50 feet with 2 turns activity    Assist        Assist Level: Supervision/Verbal cueing   Wheelchair 150 feet activity     Assist      Assist Level: Supervision/Verbal cueing   Blood pressure 123/72, pulse 92, temperature 97.9 F (36.6 C), resp. rate 16, height 5\' 3"  (1.6 m), weight 70.4 kg, last menstrual period 01/16/2016, SpO2 97 %.  Medical Problem List and Plan: 1. 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  Antithrombotics: -DVT/anticoagulation:  Pharmaceutical: Heparin- will  try and change to Lovenox- as long as labs in AM are OK -> HgB stable, Cr WNL, switch to Lovenox inj 40 mg daily              -antiplatelet therapy: N/A 3. Pain Management: Lidoderm patch, Tylenol as needed - stable  - Reports chronic neck pain with Percocet 10 mg at home; was getting Oxy 5 mg Q6H PRN per admission notes; Oxy 2.5 mg Q6H PRN, will wean as tolerated. Cannot prescribe pain medication on discharge d/t documented history of snorting oxycodone; patient aware.   - States she  gets gabapentin + Lyrica for peripheral neuropathy at home; not currently bothersome; monitor  1/20- will change bed to air mattress ot help with back pain.   1/22 - reverted back to regular mattress d.t increase back pain  4. Mood/Behavior/Sleep:              -antipsychotic agents: Clonazepam 0.5 mg 3 times daily, melatonin 3 mg nightly  1/23: Encourage compliance with timed toiletting due to reported incontinence d/t fatigue, unwilling to get OOB  5. Neuropsych/cognition: This patient is capable of making decisions on her own behalf.  - Per Rec therapy recs, neuropsych consulted. Assessment pending.   6. Skin/Wound Care Stage II L inner buttock pressure ulcer- from [place]; tracheostomy opening - will do foam dressing and monitor clinically -stop using Purewick- explained to pt. Incontinence as above.  - Routine skin checks - tracheostomy site; healed, open to air  7. Fluids/Electrolytes/Nutrition: Routine in and outs with follow-up chemistries - Mild low K on admission labs; added K dur 20 meq 1 tab BID; still low increase KCL to 51meq  - 1/15: K 3.3; poor PO with intermittent refusals; switch to K powder 40 meq BID for 3 days and re-test  - 1/16: DC K phos d/t NPO; labs pending  - 1/17: Had 80 meq IV K overnight for 2.9; 3.3 this AM. Repeat IV 80 meq today with labs in AM. Phos and Mag WNL, no repletion needed. Will resume clear liquid diet today per GI and given maintenance fluids 75 cc/hr NS for 12  hours.   - 1/18: Had 20 meq IV K + 80 meq PO K yesterday, with improvement of K to 3.6 this AM. Continue on 40 meq BID regimen and recheck in AM to ensure no further drop.   - 1/19: K repleted; continue 40 meq BID PO and re-test Monday  1/22: K 3.7; appropriate, continue current repletion.   1/23: K 3.9. Na improved from 129->130 off of IV fluids. Encourage improved POs and repeat labs Friday.   - Ca elevated but stable, monitor - Low protein/albumin, adding Boost supplement TID for nutrition and wound healing   8.  Respiratory failure/tracheostomy.  Decannulated 1 week ago- no air leak.  Check oxygen saturations every shift- off O2- current sats 96-97% - Admission: Coarse cough with light green sputum- will check f/u CXR  - 1/11 CXR: Multifocal airspace consolidation throughout the left lung appears stable. R lung clear. Vitals stable on RA. Continue current management.    1/14- WBC up to 17- per #15  1/14: WBC downtrending; likely source UTI  1/17: Afebrile, vitals stable, WBC continues downtrend on UTI Tx. Mild sputum production, complex pathology on CT abdomen but appears stable from prior images. Low suspicion for recurrent pneumonia, monitor closely  1/23: Duonebs Q8h PRN for SOB added. CXR today for subjective SOB; given improved WBC stable at 10-12 low suspicion for recurrent PNA  1/24: IR to evalaute for potential effusion tap, given re-loculated on CXR. Awaiting recommendations.   9.  AKI.  Resolved.  Latest creatinine 0.42.  Follow-up chemistries - Cr 0.5 this AM; WNL; encourage fluids with energy expenditure and follow  1/14- labs in AM  1/15: Cr stable 0.4; on IVF 75/hr for 24 hours; encouraged patient to drink 6x 250 cc water cups today given increased hydration needs with infection  1/16: DC IVF, labs and vitals stable  1/17: Resume IVF today as above; NPO yesterday; Cr stable  1/19: Dced IV abx, fluids; now tolerating PO.  Labs   1/22: Continue IV compazine PRN for nausea, DC  IVF d/t hyponatremia; recheck in AM.   10.  History of tobacco polysubstance use.  Provide counseling  11.  Prediabetes.  Hemoglobin A1c 6.1.  Blood sugar checks discontinued  12.  Dysphagia.  Dysphagia #2 thin liquids.  Follow-up speech therapy - stable  1/16: NPO except sips with meds and ice chips d/t concern for colitis, awaiting GI eval  1/17: Clear liquid diet today, NPO at midnight for scope.   1/18: Cleared by GI to resume diet  - Dys 3 with thins 1/19  13. Constipation- ongoing Last recorded BM 2 d ago , will give glycerin supp, await KUB  1/14- had moderate stool burden- given SMOG enema last night- pt having bright red clots since then  1/15: Educated patient on intermittent refusals of bowel meds  1/17: Continue BID miralax 17 g per GI recs; bowel PREP today for scope in AM  1/18: BID miralax per GI; had multiple enemas for bowel prep  1/20- no BM in 3 days- said miralax and Sorbitol make her nauseated- will try breaking up sorbitol and giving compazine prn to help- if not, can try Lactulose if need be, but that can also make pt's nauseated as well  - 1/22: Amitiza per home regimen, suppository PRN pending KUB showed scattered stool, no acute findings. Resume miralax BID when tolerating POs.   1/23: Encouraging POS, changed Miralax to Colace 100 mg BID, also drinking prune juice. Did have small BM with suppository overnight.   1/24: Small BM overnight; continue current regimen  14. BRBPR w/ anemia, colitis - resolved  1/14- Hb is actually up from 9.1 to 11- so think blood loss is negligible- however will make sure has labs in AM as well.   1/15: HgB 9.5; H/H repeat ordered in AM d/t likely hemoconcentrated with prior 11 and no further BRBPR   1/16: 2x liquis red mucusy stools overnight. KUB 1/14 with ?colitis. Patient made NPO, awaiting GI evaluation, appreciate input. Crp, CMP, and CBC w diff ordered.  Remains N/V with PO Zofran and IV compazine Prn available.   1/17: GI  consulted, appreciate their expertise and input. CT abdomen overnight with results showing ?rectosigmoid colitis. Unlikely infectious. Cleared for clear liquid diet today, patient to have colonoscopy vs. Flex sigmoidoscopy in AM; +/- EGD depending on iron panel.   1/18: See procedure note. Nonspecific colitis observed on flex sig. Biopsies pending. Per GI, cleared for PO diet with gentle miralax 17 g BID bowel regimen. Iron panel with low TIBC, high ferritin = likely anemia chronic disease, low suspicion for acute bleed.    15. Leukocytosis - Uti +/- colitis - improving  1/14- WBC up to 17k from 11k- will check U/A and Cx but if that's negative, think the significant pleural effusion/multiloculated cosolidation on L lung could be getting infected- if need be, wil call ID to discuss- cannot see if this was treatment in [facility] after 1/1 when WBC went up to [facility]  1/15: See above; likely UTI started Keflex 500 mg q8 hours and since not voiding a lot, will start NS IVFs 75cc hour for 1 day -> switched to Rocephin 1 G daily d/t poor PO tolerance, plan to switch back to PO once improved  1/16: Continue IV abx d/t NPO, N/V; awaiting sensitivities  1/17: WBC improving; continue IV Abx until post-scope and then advance to PO   1/18: Out to procedure today, switch to PO tomorrow AM  with labs  1/19: Switched to ampicillin 500 mg TID PO for 3 days; WBC WNL - finished regimen  1/22: Mildly increase WBC today, will recheck in AM and if increasing further and/or fever will do full infectious workup with CXR, respiratory culture, repeat UA, Bcx2  1/23: WBC stable at 10-12; monitor  17. Tachycardia - mild , asymptomatic - resolved Vitals:   02/18/22 1951 02/19/22 0327  BP: 131/76 123/72  Pulse: (!) 107 92  Resp: 17 16  Temp: 98 F (36.7 C) 97.9 F (36.6 C)  SpO2: 96% 97%      LOS: 13 days A FACE TO FACE EVALUATION WAS PERFORMED  Janet Hensley 02/19/2022, 1:17 PM

## 2022-02-20 DIAGNOSIS — G7281 Critical illness myopathy: Secondary | ICD-10-CM | POA: Diagnosis not present

## 2022-02-20 MED ORDER — SENNA 8.6 MG PO TABS
1.0000 | ORAL_TABLET | Freq: Every day | ORAL | Status: DC
  Administered 2022-02-20 – 2022-02-27 (×8): 8.6 mg via ORAL
  Filled 2022-02-20 (×8): qty 1

## 2022-02-20 NOTE — Progress Notes (Signed)
Physical Therapy Session Note  Patient Details  Name: Janet Hensley MRN: [id number] Date of Birth: 03-02-1970  Today's Date: 02/20/2022 PT Individual Time: 1015-1045 PT Individual Time Calculation (min): 30 min   Short Term Goals: Week 2:  PT Short Term Goal 1 (Week 2): Pt will perform bed mobility with minA consistently. PT Short Term Goal 2 (Week 2): Pt will perform bed to chair transfer with minA consistently. PT Short Term Goal 3 (Week 2): Pt will ambulate x15' with minA and LRAD.  Skilled Therapeutic Interventions/Progress Updates:  Patient greeted sitting upright in wheelchair in room and agreeable to PT treatment session. Patient wheeled to rehab gym for time management and energy conservation. Patient performed sit/stand with RW and MinA- VC for improved hip extension and increased BOS. While standing, patient performed x30 toe taps to 4" step with RW for B UE support and CGA/MinA from therapist for stability. Patient then stood with RW and performed x5 step ups with each LE to 4" step with CGA/MinA Patient required extended seated rest break in between each activity secondary to fatigue. Patient returned to her room where she performed sit/stand with RW and MinA and then stand pivot transfer to EOB with CGA- Patient with posterior LOB once B LE were touching the bed requiring MinA for improved stability secondary to poor righting reactions. Patient transitioned from sitting EOB to supine with Supv. Patient left supine in bed with call bell within reach, bed alarm on and all needs met.    Therapy Documentation Precautions:  Precautions Precautions: Fall Restrictions Weight Bearing Restrictions: No   Therapy/Group: Individual Therapy  [name] 02/20/2022, 7:51 AM

## 2022-02-20 NOTE — Progress Notes (Signed)
Speech Language Pathology Weekly Progress and Session Note  Patient Details  Name: Janet Hensley MRN: [id number] Date of Birth: 1970/09/13  Beginning of progress report period: February 13, 2022 End of progress report period: February 20, 2022  Today's Date: 02/20/2022 SLP Individual Time: 1330-1415 SLP Individual Time Calculation (min): 45 min  Short Term Goals: Week 2: SLP Short Term Goal 1 (Week 2): Patient will recall new, daily information with supervision verbal and visual cues for use of compensatory strategies. SLP Short Term Goal 1 - Progress (Week 2): Met SLP Short Term Goal 2 (Week 2): Patient will demonstrate functional problem solving for mildly complex tasks with Supervision verbal cues. SLP Short Term Goal 2 - Progress (Week 2): Met SLP Short Term Goal 3 (Week 2): Patient will demonstrate efficient mastication with complete oral clearance with trials of regular textures without overt s/s of aspiration with Mod I over 2 sessions prior to upgrade. SLP Short Term Goal 3 - Progress (Week 2): Not met    New Short Term Goals: Week 3: SLP Short Term Goal 1 (Week 3): STGs=LTGs due to ELOS  Weekly Progress Updates: Patient has made functional gains and has met 2 of 3 STGs this reporting period. Participation has been inconsistent at times due ongoing GI issues resulting in patient not feeling well. Currently, patient is consuming Dys. 3 textures with thin liquids with minimal overt s/s of aspiration and is overall Mod I for use of swallowing compensatory strategies. Trials of regular textures ongoing. Patient demonstrates improved cognitive functioning and requires overall supervision level verbal cues to complete functional and familiar tasks safely in regards to problem solving and recall with use of strategies. Patient and family education ongoing. Patient would benefit from continued skilled SLP intervention to maximize her cognitive and swallowing function prior to discharge.      Intensity: Minumum of 1-2 x/day, 30 to 90 minutes Frequency: 3 to 5 out of 7 days Duration/Length of Stay: 02/28/22 Treatment/Interventions: Cognitive remediation/compensation;Dysphagia/aspiration precaution training;Internal/external aids;Speech/Language facilitation;Therapeutic Activities;Environmental controls;Cueing hierarchy;Functional tasks;Patient/family education   Daily Session  Skilled Therapeutic Interventions:  Skilled treatment session focused on cognitive goals. Upon arrival, patient was awake in bed and requested to get out of the bed. Patient sat EOB independently and stood with Min A from an elevated surface with use of the RW. SLP facilitated session by providing supervision level verbal cues for patient to self-monitor and correct errors during a complex medication management task in which she had to organize a QD pill box. Patient performed task in a moderately distracting environment for 30 minutes independently. Patient brought to gym at end of session for participation in an upcoming group. Continue with current plan of care.     Pain Pain Assessment Pain Scale: 0-10 Pain Score: 7  Pain Intervention(s): Medication (See eMAR)  Therapy/Group: Individual Therapy  Johne Buckle 02/20/2022, 6:22 AM

## 2022-02-20 NOTE — Group Note (Signed)
Patient Details Name: Janet Hensley MRN: [id number] DOB: 1971/01/13 Today's Date: 02/20/2022  Time Calculation: OT Group Time Calculation OT Group Start Time: 5093 OT Group Stop Time: 1528 OT Group Time Calculation (min): 60 min      Group Description: Stress management: Pt participated in group session with a focus on stress mgmt, education provided on healthy coping strategies, and social interaction. Focus of session on providing coping strategies to manage new diagnosis to allow for improved mental health to increase overall quality of life . Discussed how to break down stressors into "daily hassles," "major life stressors" and "life circumstances" in an effort to allow pts to chunk their stressors into groups and determine where to best put their efforts/time when dealing with stress. Provided active listening, emotional support and therapeutic use of self. Offered education on factors that protect [name] against stress such as "daily uplifts," "healthy coping strategies" and "protective factors." Encouraged all group members to make an effort to actively recall one event from their day that was a daily uplift in an effort to protect their mindset from stressors as well as sharing this information with their caregivers to facilitate improved caregiver communication and decrease overall burden of care.  Issued pt handouts on healthy coping strategies to implement into routine.   Individual level documentation: Patient participated with full collaboration during session.   Pain:  No pain       [name] 02/20/2022, 3:59 PM

## 2022-02-20 NOTE — Group Note (Signed)
Patient Details Name: Zori C Awbrey MRN: [id number] DOB: 27-May-1970 Today's Date: 02/20/2022   Group Description: Stress management: Pt participated in group session with a focus on stress mgmt, education provided on healthy coping strategies, and social interaction. Focus of session on providing coping strategies to manage new diagnosis to allow for improved mental health to increase overall quality of life . Discussed how to break down stressors into "daily hassles," "major life stressors" and "life circumstances" in an effort to allow pts to chunk their stressors into groups and determine where to best put their efforts/time when dealing with stress. Provided active listening, emotional support and therapeutic use of self. Offered education on factors that protect [name] against stress such as "daily uplifts," "healthy coping strategies" and "protective factors." Encouraged all group members to make an effort to actively recall one event from their day that was a daily uplift in an effort to protect their mindset from stressors as well as sharing this information with their caregivers to facilitate improved caregiver communication and decrease overall burden of care.  Issued pt handouts on healthy coping strategies to implement into routine.   Individual level documentation: Patient participated with full collaboration during session.   Pain:no c/o   Kemia Wendel 02/20/2022, 4:01 PM

## 2022-02-20 NOTE — Progress Notes (Signed)
PROGRESS NOTE   Subjective/Complaints:  Per IR and Pulmonology eval yesterday, no tap of loculated effusion indicated at this time. Patient feels well, SOB much improved with duonebs and POs much improved (consuming 25-85% meals). Pain well controlled.   + SOB, + constipation ROS:  Denies fevers, chills, diarrhea, cough, chest pain, new weakness or paraesthesias.     Objective:   DG CHEST PORT 1 VIEW  Result Date: 02/18/2022 CLINICAL DATA:  SOB EXAM: PORTABLE CHEST 1 VIEW COMPARISON:  02/06/2022 FINDINGS: The heart size and mediastinal contours are within normal limits. Diffuse parenchymal consolidation on the left has improved somewhat in the interval. Left-sided pleural effusion with loculation is again noted. No pneumothorax. Right lung clear. IMPRESSION: Diffuse parenchymal consolidation left hemithorax with some improvement in aeration compared to the prior study. Large left-sided pleural effusion which appears to be loculated again noted. Electronically Signed   By: Layla Maw M.D.   On: 02/18/2022 14:20   Recent Labs    02/18/22 0800  WBC 10.1  HGB 10.2*  HCT 31.5*  PLT 468*    Recent Labs    02/18/22 0800  NA 130*  K 3.9  CL 101  CO2 21*  GLUCOSE 97  BUN 8  CREATININE 0.57  CALCIUM 10.5*     Intake/Output Summary (Last 24 hours) at 02/20/2022 1230 Last data filed at 02/20/2022 0915 Gross per 24 hour  Intake 720 ml  Output --  Net 720 ml         Physical Exam: Vital Signs Blood pressure 110/69, pulse 94, temperature 97.9 F (36.6 C), resp. rate 19, height 5\' 3"  (1.6 m), weight 70.4 kg, last menstrual period 01/16/2016, SpO2 96 %.   General: No acute distress   Mood and affect are appropriate Heart: Regular rate and rhythm no rubs murmurs or extra sounds Lungs: Mild L middle lobe expiratory wheezing, breath sounds decreased in LLL. No cough this AM.  Abdomen: Positive bowel sounds,  normoactive, nondistended Extremities: No clubbing, cyanosis, or edema. Moving all 4 limbs in WC antigravity.  Skin: No evidence of breakdown, no evidence of rash. +tracheostomy site closed, open to air.  + buttocks skin tear with aquagel, not examined Neurological: AAOx3. Appropriate. Sensation intact in all extremities. No apparent deficits.  Musculoskeletal: BL LE ankles PROM to neutral.    Assessment/Plan: 1. Functional deficits which require 3+ hours per day of interdisciplinary therapy in a comprehensive inpatient rehab setting. Physiatrist is providing close team supervision and 24 hour management of active medical problems listed below. Physiatrist and rehab team continue to assess barriers to discharge/monitor patient progress toward functional and medical goals  Care Tool:  Bathing    Body parts bathed by patient: Chest, Abdomen, Right upper leg, Left upper leg, Face   Body parts bathed by helper: Left lower leg, Right lower leg, Front perineal area, Buttocks, Right arm, Left arm     Bathing assist Assist Level: Maximal Assistance - Patient 24 - 49%     Upper Body Dressing/Undressing Upper body dressing   What is the patient wearing?: Pull over shirt    Upper body assist Assist Level: Moderate Assistance - Patient 50 - 74%  Lower Body Dressing/Undressing Lower body dressing      What is the patient wearing?: Pants     Lower body assist Assist for lower body dressing: Maximal Assistance - Patient 25 - 49%     Toileting Toileting    Toileting assist Assist for toileting: Total Assistance - Patient < 25%     Transfers Chair/bed transfer  Transfers assist     Chair/bed transfer assist level: Minimal Assistance - Patient > 75%     Locomotion Ambulation   Ambulation assist      Assist level: Minimal Assistance - Patient > 75% Assistive device: Walker-rolling Max distance: 90'   Walk 10 feet activity   Assist  Walk 10 feet activity did not  occur: Safety/medical concerns  Assist level: Minimal Assistance - Patient > 75% Assistive device: Walker-rolling   Walk 50 feet activity   Assist Walk 50 feet with 2 turns activity did not occur: Safety/medical concerns  Assist level: Minimal Assistance - Patient > 75% Assistive device: Walker-rolling    Walk 150 feet activity   Assist Walk 150 feet activity did not occur: Safety/medical concerns         Walk 10 feet on uneven surface  activity   Assist Walk 10 feet on uneven surfaces activity did not occur: Safety/medical concerns         Wheelchair     Assist Is the patient using a wheelchair?: Yes Type of Wheelchair: Manual    Wheelchair assist level: Supervision/Verbal cueing Max wheelchair distance: 150'    Wheelchair 50 feet with 2 turns activity    Assist        Assist Level: Supervision/Verbal cueing   Wheelchair 150 feet activity     Assist      Assist Level: Supervision/Verbal cueing   Blood pressure 110/69, pulse 94, temperature 97.9 F (36.6 C), resp. rate 19, height 5\' 3"  (1.6 m), weight 70.4 kg, last menstrual period 01/16/2016, SpO2 96 %.  Medical Problem List and Plan: 1. 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  Antithrombotics: -DVT/anticoagulation:  Pharmaceutical: Heparin- will try and change to Lovenox- as long as labs in AM are OK -> HgB stable, Cr WNL, switch to Lovenox inj 40 mg daily              -antiplatelet therapy: N/A 3. Pain Management: Lidoderm patch, Tylenol as needed - stable  - Reports chronic neck pain with Percocet 10 mg at home; was getting Oxy 5 mg Q6H PRN per admission notes; Oxy 2.5 mg Q6H PRN, will wean as tolerated. Cannot prescribe pain medication on discharge d/t documented history of snorting oxycodone; patient  aware.   - States she gets gabapentin + Lyrica for peripheral neuropathy at home; not currently bothersome; monitor  1/20- will change bed to air mattress ot help with back pain.   1/22 - reverted back to regular mattress d.t increase back pain  4. Mood/Behavior/Sleep:              -antipsychotic agents: Clonazepam 0.5 mg 3 times daily, melatonin 3 mg nightly  1/23: Encourage compliance with timed toiletting due to reported incontinence d/t fatigue, unwilling to get OOB - improving 1/25  5. Neuropsych/cognition: This patient is capable of making decisions on her own behalf.  - Per Rec therapy recs, neuropsych consulted. Assessment pending.   6. Skin/Wound Care Stage II L inner buttock pressure ulcer- from Purewick; tracheostomy opening - will do foam dressing and monitor clinically -stop using Purewick- explained to pt. Incontinence as above.  - Routine skin checks - tracheostomy site; healed, open to air  7. Fluids/Electrolytes/Nutrition: Routine in and outs with follow-up chemistries - Mild low K on admission labs; added K dur 20 meq 1 tab BID; still low increase KCL to  - 1/15: K 3.3; poor PO with intermittent refusals; switch to K powder 40 meq BID for 3 days and re-test  - 1/16: DC K phos d/t NPO; labs pending  - 1/17: Had 80 meq IV K overnight for 2.9; 3.3 this AM. Repeat IV 80 meq today with labs in AM. Phos and Mag WNL, no repletion needed. Will resume clear liquid diet today per GI and given maintenance fluids 75 cc/hr NS for 12 hours.   - 1/18: Had 20 meq IV K + 80 meq PO K yesterday, with improvement of K to 3.6 this AM. Continue on 40 meq BID regimen and recheck in AM to ensure no further drop.   - 1/19: K repleted; continue 40 meq BID PO and re-test Monday  1/22: K 3.7; appropriate, continue current repletion.   1/23: K 3.9. Na improved from 129->130 off of IV fluids. Encourage improved POs and repeat labs Friday.   - Ca elevated but stable, monitor - Low  protein/albumin, adding Boost supplement TID for nutrition and wound healing   8.  Respiratory failure/tracheostomy.  Decannulated 1 week ago- no air leak.  Check oxygen saturations every shift- off O2- current sats 96-97% - Admission: Coarse cough with light green sputum- will check f/u CXR  - 1/11 CXR: Multifocal airspace consolidation throughout the left lung appears stable. R lung clear. Vitals stable on RA. Continue current management.    1/14- WBC up to 17- per #15  1/14: WBC downtrending; likely source UTI  1/17: Afebrile, vitals stable, WBC continues downtrend on UTI Tx. Mild sputum production, complex pathology on CT abdomen but appears stable from prior images. Low suspicion for recurrent pneumonia, monitor closely  1/23: Duonebs Q8h PRN for SOB added. CXR today for subjective SOB; given improved WBC stable at 10-12 low suspicion for recurrent PNA  1/24: IR to evalaute for potential effusion tap, given re-loculated on CXR -> no tap recommended at this time; monitor and continue duonebs for symptomatic SOB  9.  AKI.  Resolved.  Latest creatinine 0.42.  Follow-up chemistries - Cr 0.5 this AM; WNL; encourage fluids with energy expenditure and follow  1/14- labs in AM  1/15: Cr stable 0.4; on IVF 75/hr for 24 hours; encouraged patient to drink 6x 250 cc water cups today given increased hydration needs with infection  1/16: DC IVF, labs and vitals stable  1/17: Resume IVF today as above; NPO yesterday; Cr stable  1/19: Dced IV abx, fluids; now tolerating PO. Labs   1/22: Continue IV compazine PRN for nausea, DC IVF d/t hyponatremia; recheck in AM.   1/25: Used compazine IV x1 overnight; d/c IV, po Zofran PRN for nausea, labs in AM  10.  History of tobacco polysubstance use.  Provide counseling  11.  Prediabetes.  Hemoglobin A1c 6.1.  Blood sugar checks discontinued  12.  Dysphagia.  Dysphagia #2 thin liquids.  Follow-up speech therapy - stable  1/16: NPO except sips with meds and ice  chips d/t concern for colitis, awaiting GI eval  1/17: Clear liquid diet today, NPO at midnight for scope.   1/18: Cleared by GI to resume diet  - Dys 3 with thins 1/19 Diet Orders (From admission, onward)     Start     Ordered   02/14/22 1206  DIET DYS 3 Room service appropriate? Yes; Fluid consistency: Thin  Diet effective now       Question Answer Comment  Room service appropriate? Yes   Fluid consistency: Thin      02/14/22 1205             13. Constipation- ongoing Last recorded BM 2 d ago , will give glycerin supp, await KUB  1/14- had moderate stool burden- given SMOG enema last night- pt having bright red clots since then  1/15: Educated patient on intermittent refusals of bowel meds  1/17: Continue BID miralax 17 g per GI recs; bowel PREP today for scope in AM  1/18: BID miralax per GI; had multiple enemas for bowel prep  1/20- no BM in 3 days- said miralax and Sorbitol make her nauseated- will try breaking up sorbitol and giving compazine prn to help- if not, can try Lactulose if need be, but that can also make pt's nauseated as well  - 1/22: Amitiza per home regimen, suppository PRN pending KUB showed scattered stool, no acute findings. Resume miralax BID when tolerating POs.   1/23: Encouraging POS, changed Miralax to Colace 100 mg BID, also drinking prune juice. Did have small BM with suppository overnight.  1/24: Small BM overnight; continue current regimen 1/25: No BM in 2 days; adding Senna 7.6 mg 1 tab QHS d/t poor miralax tolerance.   14. BRBPR w/ anemia, colitis - resolved  1/14- Hb is actually up from 9.1 to 11- so think blood loss is negligible- however will make sure has labs in AM as well.   1/15: HgB 9.5; H/H repeat ordered in AM d/t likely hemoconcentrated with prior 11 and no further BRBPR   1/16: 2x liquis red mucusy stools overnight. KUB 1/14 with ?colitis. Patient made NPO, awaiting GI evaluation, appreciate input. Crp, CMP, and CBC w diff ordered.   Remains N/V with PO Zofran and IV compazine Prn available.   1/17: GI consulted, appreciate their expertise and input. CT abdomen overnight with results showing ?rectosigmoid colitis. Unlikely infectious. Cleared for clear liquid diet today, patient to have colonoscopy vs. Flex sigmoidoscopy in AM; +/- EGD depending on iron panel.   1/18: See procedure note. Nonspecific colitis observed on flex sig. Biopsies pending. Per GI, cleared for PO diet with gentle miralax 17 g BID bowel regimen. Iron panel with low TIBC, high ferritin = likely anemia chronic disease, low suspicion for acute bleed.    15. Leukocytosis - Uti +/- colitis - improving  1/14- WBC up to 17k from 11k- will check U/A and Cx but if that's negative, think the significant pleural effusion/multiloculated cosolidation on L lung could be getting infected- if need be, wil call ID to discuss- cannot see if this was treatment in [facility] after 1/1 when WBC went up to [facility]  1/15: See above; likely UTI started Keflex 500 mg q8 hours and since not voiding a lot, will start NS IVFs 75cc hour for 1 day -> switched to Rocephin 1 G daily d/t poor PO tolerance, plan to switch back to PO once improved  1/16: Continue IV abx d/t  NPO, N/V; awaiting sensitivities  1/17: WBC improving; continue IV Abx until post-scope and then advance to PO   1/18: Out to procedure today, switch to PO tomorrow AM with labs  1/19: Switched to ampicillin 500 mg TID PO for 3 days; WBC WNL - finished regimen  1/22: Mildly increase WBC today, will recheck in AM and if increasing further and/or fever will do full infectious workup with CXR, respiratory culture, repeat UA, Bcx2  1/23: WBC stable at 10-12; monitor  17. Tachycardia - mild , asymptomatic - resolved Vitals:   02/19/22 1330 02/20/22 0342  BP: 120/76 110/69  Pulse: (!) 101 94  Resp: 16 19  Temp: 98.4 F (36.9 C) 97.9 F (36.6 C)  SpO2: 100% 96%      LOS: 14 days A FACE TO FACE EVALUATION WAS  PERFORMED  Angelina Sheriff 02/20/2022, 12:30 PM

## 2022-02-20 NOTE — Progress Notes (Signed)
Occupational Therapy Weekly Progress Note  Patient Details  Name: Javiana C Vancamp MRN: [id number] Date of Birth: [birth year]  Beginning of progress report period: February 07, 2022 End of progress report period: February 20, 2022  Today's Date: 02/20/2022 OT Individual Time: [id number] OT Individual Time Calculation (min): 57 min    Patient has met 3 of 3 short term goals.  Patient is making steady progress towards OT goals. She has much improved UB strength to put on deodorant and perform UB ADLs. Sit<>stands can be as little as min A, but still mostly mod A due to LB strength deficits. Short distance ambulation within BADL tasks with min A. LB ADLs improving using modified techniques. Continue current POC.  Patient continues to demonstrate the following deficits: muscle weakness, decreased cardiorespiratoy endurance, and decreased sitting balance, decreased standing balance, decreased postural control, and decreased balance strategies and therefore will continue to benefit from skilled OT intervention to enhance overall performance with BADL and Reduce care partner burden.  Patient progressing toward long term goals..  Continue plan of care.  OT Short Term Goals Week 2:  OT Short Term Goal 1 (Week 2): Patient will complete toilet transfer with mod A of 1 OT Short Term Goal 1 - Progress (Week 2): Met OT Short Term Goal 2 (Week 2): Patient will stand at the sink with mod A of 1 in preparation for BADL Task. OT Short Term Goal 2 - Progress (Week 2): Met OT Short Term Goal 3 (Week 2): Patient will achieve figure 4 poisiton with min A in preparation for LB dressing and bathing tasks OT Short Term Goal 3 - Progress (Week 2): Met Week 3:  OT Short Term Goal 1 (Week 3): LTG=STG 2/2 ELOS  Skilled Therapeutic Interventions/Progress Updates:    Pt greeted semi-reclined in bed and agreeable to OT treatment session. Pt reported incontinence from overnight. OT discussed incontinence with pt. Pt stated she  is only incontinent at night. She thinks her body learned just to go and not wake her up since she couldn't get up to go for so long. Educated on trying to get up with nursing when she feels the urge. Pt cleansed peri area and worked on ship bridging to remove soiled brief. Pt then transferred to EOB with increased time and supervision. Dressing tasks completed at EOB with pt able to achieve figure 4 position to thread pants, socks, and shoes. Sit<>stand at EOB with RW and modA. Pt still very weak and demonstrated poor dynamic balance as she had difficulty removing unilateral UE from the RW to help with pants, Pt with posterior LOB anytime she removed one hand from the RW requiring min-mod A for dynamic balance. Pt then pivoted to wc with Min A and RW. Pt brought to therapy gym and completed 5 mins forward and 5 mins backwards on UE Ergometer for UB strength and endurance- level 2. Pt returned to room and left seated in wc with alarm belt on, call bell in reach, and neeeds met.   Therapy Documentation Precautions:  Precautions Precautions: Fall Restrictions Weight Bearing Restrictions: No Pain Denies pain   Therapy/Group: Individual Therapy  Valma Cava 02/20/2022, 12:24 PM

## 2022-02-21 DIAGNOSIS — F4322 Adjustment disorder with anxiety: Secondary | ICD-10-CM

## 2022-02-21 DIAGNOSIS — G894 Chronic pain syndrome: Secondary | ICD-10-CM | POA: Diagnosis not present

## 2022-02-21 DIAGNOSIS — G7281 Critical illness myopathy: Secondary | ICD-10-CM | POA: Diagnosis not present

## 2022-02-21 LAB — CBC
HCT: 29.9 % — ABNORMAL LOW (ref 36.0–46.0)
Hemoglobin: 9.8 g/dL — ABNORMAL LOW (ref 12.0–15.0)
MCH: 31.3 pg (ref 26.0–34.0)
MCHC: 32.8 g/dL (ref 30.0–36.0)
MCV: 95.5 fL (ref 80.0–100.0)
Platelets: 429 10*3/uL — ABNORMAL HIGH (ref 150–400)
RBC: 3.13 MIL/uL — ABNORMAL LOW (ref 3.87–5.11)
RDW: 15.1 % (ref 11.5–15.5)
WBC: 9.9 10*3/uL (ref 4.0–10.5)
nRBC: 0 % (ref 0.0–0.2)

## 2022-02-21 LAB — BASIC METABOLIC PANEL
Anion gap: 8 (ref 5–15)
BUN: 9 mg/dL (ref 6–20)
CO2: 22 mmol/L (ref 22–32)
Calcium: 10.8 mg/dL — ABNORMAL HIGH (ref 8.9–10.3)
Chloride: 102 mmol/L (ref 98–111)
Creatinine, Ser: 0.59 mg/dL (ref 0.44–1.00)
GFR, Estimated: >60 mL/min (ref >60)
Glucose, Bld: 118 mg/dL — ABNORMAL HIGH (ref 70–99)
Potassium: 3.8 mmol/L (ref 3.5–5.1)
Sodium: 132 mmol/L — ABNORMAL LOW (ref 135–145)

## 2022-02-21 MED ORDER — POLYETHYLENE GLYCOL 3350 17 G PO PACK
17.0000 g | PACK | Freq: Every day | ORAL | Status: DC
  Administered 2022-02-22 – 2022-02-24 (×3): 17 g via ORAL
  Filled 2022-02-21 (×4): qty 1

## 2022-02-21 MED ORDER — ALBUTEROL SULFATE (2.5 MG/3ML) 0.083% IN NEBU
2.5000 mg | INHALATION_SOLUTION | Freq: Three times a day (TID) | RESPIRATORY_TRACT | Status: DC
  Administered 2022-02-21 – 2022-02-22 (×4): 2.5 mg via RESPIRATORY_TRACT
  Filled 2022-02-21 (×4): qty 3

## 2022-02-21 NOTE — Progress Notes (Signed)
Physical Therapy Session Note  Patient Details  Name: Janet Hensley MRN: [id number] Date of Birth: [birth year]  Today's Date: 02/21/2022 PT Individual Time: 1502-1559 PT Individual Time Calculation (min): 57 min   Short Term Goals: Week 2:  PT Short Term Goal 1 (Week 2): Pt will perform bed mobility with minA consistently. PT Short Term Goal 2 (Week 2): Pt will perform bed to chair transfer with minA consistently. PT Short Term Goal 3 (Week 2): Pt will ambulate x15' with minA and LRAD.  Skilled Therapeutic Interventions/Progress Updates:     Pt received supine in bed and agrees to therapy. No complaint of pain. Pt performs supine to sit with bed features and cues for hand placement. Sit to stand from EOB with modA and cues for use of momentum and hand placement. Pt transfers to [facility] with RW and modA, with cues for increased eccentric control for safety and strengthening. WC transport to gym for time management. Pt performs sit to stand with bilateral hand rails with minA, then completes x1 6" step with modA and bilateral hand rails. Pt's legs buckle attempting 2nd step and requires maxA to prevent fall. Seated rest break. Pt completes car transfer with modA overall and use of RW. Assistance primarily for sit to stand transfer, with cues for hand placement and use of momentum. Pt then ambulates with RW and CGA x183', with cues for upright gaze to improve posture and balance, and decreasing WB through RW for energy conservation. Following rest break, pt transfers to [facility] with modA for sit to stand and use of RW. Pt then completes x4 3" steps with bilateral hand rails and minA, with cues for step sequencing. PT provides education to family regarding need for hands on guarding of pt anytime she is performing out of bed mobility. Pt self propels WC x200' back to room with bilateral upper extremities and cues to improve efficiency of propulsion technique. Left seated in WC with all needs within reach.  Therapy  Documentation Precautions:  Precautions Precautions: Fall Restrictions Weight Bearing Restrictions: No    Therapy/Group: Individual Therapy  Breck Coons, PT, DPT 02/21/2022, 4:23 PM

## 2022-02-21 NOTE — Progress Notes (Signed)
PROGRESS NOTE   Subjective/Complaints:  No events overnight. Patient had BM this AM, feels good. Did get flutter valve and feels with duonebs she is able to better mobilize her sputum, thinks flutter valve will help. No worsening in Sob or cough; is concerned about CXR, IR and pulmonology recommendations not to tap at this time reviewed with her and she is understanding. She spoke with Dr. Kieth Brightly today, which was helpful. Looking forward to grounds pass today to go outside with her husband; promises not to leave hospital premises or smoke.   + SOB, cough ROS:  Denies fevers, chills, diarrhea,  constipation, chest pain, new weakness or paraesthesias.     Objective:   No results found. Recent Labs    02/21/22 0604  WBC 9.9  HGB 9.8*  HCT 29.9*  PLT 429*    Recent Labs    02/21/22 0604  NA 132*  K 3.8  CL 102  CO2 22  GLUCOSE 118*  BUN 9  CREATININE 0.59  CALCIUM 10.8*     Intake/Output Summary (Last 24 hours) at 02/21/2022 1230 Last data filed at 02/21/2022 0101 Gross per 24 hour  Intake 236 ml  Output 0 ml  Net 236 ml         Physical Exam: Vital Signs Blood pressure 116/67, pulse 97, temperature 98.3 F (36.8 C), resp. rate 17, height 5\' 3"  (1.6 m), weight 70.4 kg, last menstrual period 01/16/2016, SpO2 98 %.   General: No acute distress   Mood and affect are appropriate Heart: Regular rate and rhythm no rubs murmurs or extra sounds Lungs: CTABbreath sounds decreased in LLL. Mild productive cough with minimal greyish sputum - stable Abdomen: Positive bowel sounds, normoactive, nondistended Extremities: No clubbing, cyanosis, or edema.   Skin: No evidence of breakdown, no evidence of rash. +tracheostomy site closed, open to air.  + buttocks skin tear with aquagel, not examined Neurological: AAOx3. Appropriate.  No apparent deficits.  Musculoskeletal: Moving all 4 limbs in WC antigravity and  against resistance.   Assessment/Plan: 1. Functional deficits which require 3+ hours per day of interdisciplinary therapy in a comprehensive inpatient rehab setting. Physiatrist is providing close team supervision and 24 hour management of active medical problems listed below. Physiatrist and rehab team continue to assess barriers to discharge/monitor patient progress toward functional and medical goals  Care Tool:  Bathing    Body parts bathed by patient: Chest, Abdomen, Right upper leg, Left upper leg, Face   Body parts bathed by helper: Left lower leg, Right lower leg, Front perineal area, Buttocks, Right arm, Left arm     Bathing assist Assist Level: Maximal Assistance - Patient 24 - 49%     Upper Body Dressing/Undressing Upper body dressing   What is the patient wearing?: Pull over shirt    Upper body assist Assist Level: Moderate Assistance - Patient 50 - 74%    Lower Body Dressing/Undressing Lower body dressing      What is the patient wearing?: Pants     Lower body assist Assist for lower body dressing: Maximal Assistance - Patient 25 - 49%     Toileting Toileting    Toileting assist Assist for  toileting: Total Assistance - Patient < 25%     Transfers Chair/bed transfer  Transfers assist     Chair/bed transfer assist level: Minimal Assistance - Patient > 75%     Locomotion Ambulation   Ambulation assist      Assist level: Minimal Assistance - Patient > 75% Assistive device: Walker-rolling Max distance: 90'   Walk 10 feet activity   Assist  Walk 10 feet activity did not occur: Safety/medical concerns  Assist level: Minimal Assistance - Patient > 75% Assistive device: Walker-rolling   Walk 50 feet activity   Assist Walk 50 feet with 2 turns activity did not occur: Safety/medical concerns  Assist level: Minimal Assistance - Patient > 75% Assistive device: Walker-rolling    Walk 150 feet activity   Assist Walk 150 feet activity  did not occur: Safety/medical concerns         Walk 10 feet on uneven surface  activity   Assist Walk 10 feet on uneven surfaces activity did not occur: Safety/medical concerns         Wheelchair     Assist Is the patient using a wheelchair?: Yes Type of Wheelchair: Manual    Wheelchair assist level: Supervision/Verbal cueing Max wheelchair distance: 150'    Wheelchair 50 feet with 2 turns activity    Assist        Assist Level: Supervision/Verbal cueing   Wheelchair 150 feet activity     Assist      Assist Level: Supervision/Verbal cueing   Blood pressure 116/67, pulse 97, temperature 98.3 F (36.8 C), resp. rate 17, height 5\' 3"  (1.6 m), weight 70.4 kg, last menstrual period 01/16/2016, SpO2 98 %.  Medical Problem List and Plan: 1. 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  Antithrombotics: -DVT/anticoagulation:  Pharmaceutical: Heparin- will try and change to Lovenox- as long as labs in AM are OK -> HgB stable, Cr WNL, switch to Lovenox inj 40 mg daily              -antiplatelet therapy: N/A 3. Pain Management: Lidoderm patch, Tylenol as needed - stable  - Reports chronic neck pain with Percocet 10 mg at home; was getting Oxy 5 mg Q6H PRN per admission notes; Oxy 2.5 mg Q6H PRN, will wean as tolerated. Cannot prescribe pain medication on discharge d/t documented history of snorting oxycodone; patient aware.   - States she gets gabapentin + Lyrica for peripheral neuropathy at home; not currently bothersome; monitor  1/20- will change bed to air mattress ot help with back pain.   1/22 - reverted back to regular mattress d.t increase back pain  4. Mood/Behavior/Sleep:              -antipsychotic agents: Clonazepam 0.5 mg 3 times daily, melatonin 3 mg nightly  1/23:  Encourage compliance with timed toiletting due to reported incontinence d/t fatigue, unwilling to get OOB - improving 1/25  5. Neuropsych/cognition: This patient is capable of making decisions on her own behalf.  - Per Rec therapy recs, neuropsych consulted -> saw patient 1/26, appreciate adjustment disorder with primary anxiety and chronic pain; no changes recommended   6. Skin/Wound Care Stage II L inner buttock  pressure ulcer- from Purewick; tracheostomy opening - will do foam dressing and monitor clinically - Routine skin checks - tracheostomy site; healed, open to air  7. Fluids/Electrolytes/Nutrition: Routine in and outs with follow-up chemistries - Mild low K on admission labs; added K dur 20 meq 1 tab BID; still low increase KCL to  - 1/15: K 3.3; poor PO with intermittent refusals; switch to K powder 40 meq BID for 3 days and re-test  - 1/16: DC K phos d/t NPO; labs pending  - 1/17: Had 80 meq IV K overnight for 2.9; 3.3 this AM. Repeat IV 80 meq today with labs in AM. Phos and Mag WNL, no repletion needed. Will resume clear liquid diet today per GI and given maintenance fluids 75 cc/hr NS for 12 hours.   - 1/18: Had 20 meq IV K + 80 meq PO K yesterday, with improvement of K to 3.6 this AM. Continue on 40 meq BID regimen and recheck in AM to ensure no further drop.   - 1/19: K repleted; continue 40 meq BID PO and re-test Monday  1/22: K 3.7; appropriate, continue current repletion.   1/23: K 3.9. Na improved from 129->130 off of IV fluids. Encourage improved POs and repeat labs Friday.   1/26: K 3.8, Na improved to 132 on POs only. Overall stable. No changes, BMP Monday.  - Ca elevated but stable, monitor - Low protein/albumin, adding Boost supplement TID for nutrition and wound healing   8.  Respiratory failure/tracheostomy.  Decannulated 1 week ago- no air leak.  Check oxygen saturations every shift- off O2- current sats 96-97% - Admission: Coarse cough with light green  sputum- will check f/u CXR  - 1/11 CXR: Multifocal airspace consolidation throughout the left lung appears stable. R lung clear. Vitals stable on RA. Continue current management.    1/14- WBC up to 17- per #15  1/14: WBC downtrending; likely source UTI  1/17: Afebrile, vitals stable, WBC continues downtrend on UTI Tx. Mild sputum production, complex pathology on CT abdomen but appears stable from prior images. Low suspicion for recurrent pneumonia, monitor closely  1/23: Duonebs Q8h PRN for SOB added. CXR today for subjective SOB; given improved WBC stable at 10-12 low suspicion for recurrent PNA  1/24: IR to evalaute for potential effusion tap, given re-loculated on CXR -> no tap recommended at this time; monitor and continue duonebs for symptomatic SOB  1/26: duonebs, albuterol nebs, and flutter valve for SOB and productive cough; improving symptoms per patient.   9.  AKI.  Resolved.  Latest creatinine 0.42.  Follow-up chemistries  - Cr 0.5 this AM; WNL; encourage fluids with energy expenditure and follow  1/14- labs in AM  1/15: Cr stable 0.4; on IVF 75/hr for 24 hours; encouraged patient to drink 6x 250 cc water cups today given increased hydration needs with infection  1/16: DC IVF, labs and vitals stable  1/17: Resume IVF today as above; NPO yesterday; Cr stable  1/19: Dced IV abx, fluids; now tolerating PO. Labs   1/22: Continue IV compazine PRN for nausea, DC IVF d/t hyponatremia; recheck in AM.   1/25: Used compazine IV x1 overnight; d/c IV, po Zofran PRN for nausea, labs in AM  1/26: No Prn, Cr. 0.59 - stable, WNL. No additional interventions  10.  History of tobacco polysubstance use.  Provide counseling  - Patient Hx snorting her prescribed oxycodone; understands no narcotic scripts from hospital at discharge  11.  Prediabetes.  Hemoglobin A1c  6.1.  Blood sugar checks discontinued  12.  Dysphagia.  Dysphagia #2 thin liquids.  Follow-up speech therapy - stable  1/16: NPO except  sips with meds and ice chips d/t concern for colitis, awaiting GI eval  1/17: Clear liquid diet today, NPO at midnight for scope.   1/18: Cleared by GI to resume diet  - Dys 3 with thins 1/19 Diet Orders (From admission, onward)     Start     Ordered   02/14/22 1206  DIET DYS 3 Room service appropriate? Yes; Fluid consistency: Thin  Diet effective now       Question Answer Comment  Room service appropriate? Yes   Fluid consistency: Thin      02/14/22 1205             13. Constipation- ongoing Last recorded BM 2 d ago , will give glycerin supp, await KUB  1/14- had moderate stool burden- given SMOG enema last night- pt having bright red clots since then  1/15: Educated patient on intermittent refusals of bowel meds  1/17: Continue BID miralax 17 g per GI recs; bowel PREP today for scope in AM  1/18: BID miralax per GI; had multiple enemas for bowel prep  1/20- no BM in 3 days- said miralax and Sorbitol make her nauseated- will try breaking up sorbitol and giving compazine prn to help- if not, can try Lactulose if need be, but that can also make pt's nauseated as well  - 1/22: Amitiza per home regimen, suppository PRN pending KUB showed scattered stool, no acute findings. Resume miralax BID when tolerating POs.   1/23: Encouraging POS, changed Miralax to Colace 100 mg BID, also drinking prune juice. Did have small BM with suppository overnight.  1/24: Small BM overnight; continue current regimen 1/25: No BM in 2 days; adding Senna 7.6 mg 1 tab QHS d/t poor miralax tolerance.  1/26: BM this AM; continue current regimen  14. BRBPR w/ anemia, colitis - resolved  1/14- Hb is actually up from 9.1 to 11- so think blood loss is negligible- however will make sure has labs in AM as well.   1/15: HgB 9.5; H/H repeat ordered in AM d/t likely hemoconcentrated with prior 11 and no further BRBPR   1/16: 2x liquis red mucusy stools overnight. KUB 1/14 with ?colitis. Patient made NPO, awaiting GI  evaluation, appreciate input. Crp, CMP, and CBC w diff ordered.  Remains N/V with PO Zofran and IV compazine Prn available.   1/17: GI consulted, appreciate their expertise and input. CT abdomen overnight with results showing ?rectosigmoid colitis. Unlikely infectious. Cleared for clear liquid diet today, patient to have colonoscopy vs. Flex sigmoidoscopy in AM; +/- EGD depending on iron panel.   1/18: See procedure note. Nonspecific colitis observed on flex sig. Biopsies pending. Per GI, cleared for PO diet with gentle miralax 17 g BID bowel regimen. Iron panel with low TIBC, high ferritin = likely anemia chronic disease, low suspicion for acute bleed.    15. Leukocytosis - Uti +/- colitis - resolved  1/14- WBC up to 17k from 11k- will check U/A and Cx but if that's negative, think the significant pleural effusion/multiloculated cosolidation on L lung could be getting infected- if need be, wil call ID to discuss- cannot see if this was treatment in [facility] after 1/1 when WBC went up to [facility]  1/15: See above; likely UTI started Keflex 500 mg q8 hours and since not voiding a lot, will start NS IVFs 75cc  hour for 1 day -> switched to Rocephin 1 G daily d/t poor PO tolerance, plan to switch back to PO once improved  1/16: Continue IV abx d/t NPO, N/V; awaiting sensitivities  1/17: WBC improving; continue IV Abx until post-scope and then advance to PO   1/18: Out to procedure today, switch to PO tomorrow AM with labs  1/19: Switched to ampicillin 500 mg TID PO for 3 days; WBC WNL - finished regimen  1/22: Mildly increase WBC today, will recheck in AM and if increasing further and/or fever will do full infectious workup with CXR, respiratory culture, repeat UA, Bcx2    17. Tachycardia - mild , asymptomatic - stable Vitals:   02/20/22 2010 02/21/22 0508  BP: 119/87 116/67  Pulse: 100 97  Resp: 18 17  Temp: 97.6 F (36.4 C) 98.3 F (36.8 C)  SpO2: 98% 98%      LOS: 15 days A FACE TO FACE  EVALUATION WAS PERFORMED  Gertie Gowda 02/21/2022, 12:30 PM

## 2022-02-21 NOTE — Progress Notes (Signed)
Speech Language Pathology Daily Session Note  Patient Details  Name: Janet Hensley MRN: [id number] Date of Birth: 1970-04-13  Today's Date: 02/21/2022 SLP Individual Time: 0017-4944 SLP Individual Time Calculation (min): 28 min  Short Term Goals: Week 3: SLP Short Term Goal 1 (Week 3): STGs=LTGs due to ELOS  Skilled Therapeutic Interventions: Skilled treatment session focused on education with the patient, her husband, and her daughter. SLP facilitated session by providing education regarding patient's current cognitive functioning and strategies to utilize at home to maximize attention, memory, and overall safety. SLP reinforced the importance of implementing a routine at home to help facilitate time out of bed and overall cognitive remediation. Patient and family verbalized understanding and handouts were given to reinforce education. Patient left with family present. Continue with current plan of care.      Pain No/Denies Pain   Therapy/Group: Individual Therapy  Lakeisha Waldrop, [place] 02/21/2022, 3:14 PM

## 2022-02-21 NOTE — Consult Note (Signed)
Neuropsychological Consultation   Patient:   Janet Hensley   DOB:   05-31-1970  MR Number:  [id number]  Location:  [place] [id number] [place] [address] Dept: [place]: [phone]           Date of Service:   02/21/2022  Start Time:   8 AM End Time:   9 AM  Provider/Observer:  Ilean Skill, Psy.D.       Clinical Neuropsychologist       Billing Code/Service: [phone]  Reason for Service:    Janet Hensley is a 52 year old female referred for neuropsychological consultation due to coping and adjustment issues with some anxiety features during her ongoing inpatient comprehensive rehabilitation admission.  Patient with long history of chronic pain syndrome and long-term pain medication use and misuse.  These are being addressed during CIR treatment.  Patient has had a very long hospital stay including receiving ECMO and [facility]  Patient initially presented to [facility] on 12/22/2021 with increasing shortness of breath and hypoxic requiring intubation as well as mechanical ventilation.  Patient showed complete whiteout of left lung.  Blood culture and urinalysis showed significant infection.  Infectious disease consulted placed on broad-spectrum antibiotics.  Hospital course was complicated by AKI.  Patient had difficulty weaning off vent and was transferred to [facility] on 12/13 for ongoing ventilation care.  Patient has been decannulated and diet advanced and admitted to the comprehensive inpatient rehabilitation unit due to profound debility and has now been on the rehab service for roughly 10 days with plan to discharge home in roughly 7 days.     Below is the HPI for the current admission for convenience:  HPI: Janet C Ln. is a 52 year old right-handed female with history of former tobacco use as well as polysubstance  abuse.  Per chart review patient lives with her husband and brother.  Independent prior to admission.  1 level home 4 steps to entry.   Presented 12/22/2021 to [facility] with increasing shortness of breath and vomiting hypoxic 89% requiring intubation as well as mechanical ventilation.  Chest x-ray showed complete whiteout of left lung.  Bronchoscopy showed purulent secretions on the left which were suctioned out.  Blood culture showed Rothia Muciliaginosa, urine tested positive for Legionella, bronchoscopy positive for Pseudomonas and stenotrophomonas.  ID consulted placed on broad-spectrum antibiotics.  Hospital course complicated by AKI and bicarbonate drip was started.  Patient was transferred to [facility] 12/2 for ARDS management and possible need for ECMO.  Patient did receive ECMO.  Unable to wean off vent with tracheostomy placed 12/8.  She was transferred to [facility] 12/13 for ongoing ventilation care.  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  Patient has been decannulated and diet advanced dysphagia #2 thin liquids.  She has been weaned from oxygen.  AKI resolved latest creatinine 0.42.  She did have bouts of hypotension placed on midodrine.  Maintained on subcutaneous heparin for DVT prophylaxis.  Therapy evaluations ongoing patient with profound debility and was admitted for a comprehensive rehab program.   Current Status:  Patient was awake and alert and oriented.  She acknowledged difficulty getting going early in the morning and acknowledged a history of some anxiety but also was identified as being more in the AM.  The patient reports that she feels like she is making significant progress MRI of  the specific discharge date is still being determined and there is team conference today the patient is hopeful.  Patient with anxiety over recent chest film but continues to improve and is looking forward to discharge home but what she will need and  accomodation for support still being worked out.  Husband works a lot of hours and brother that lives with them not able to help a lot.    Behavioral Observation: Janet Hensley  presents as a 52 y.o.-year-old Right handed Caucasian Female who appeared her stated age. her dress was Appropriate and she was Well Groomed and her manners were Appropriate to the situation.  her participation was indicative of Appropriate and Redirectable behaviors.  There were physical disabilities noted.  she displayed an appropriate level of cooperation and motivation.    Interactions:    Active Appropriate  Attention:   abnormal and attention span appeared shorter than expected for age  Memory:   within normal limits; recent and remote memory intact  Visuo-spatial:  not examined  Speech (Volume):  normal  Speech:   normal; normal  Thought Process:  Coherent and Relevant  Though Content:  WNL; not suicidal and not homicidal  Orientation:   person, place, time/date, and situation  Judgment:   Fair  Planning:   Poor  Affect:    Anxious  Mood:    Anxious  Insight:   Good  Intelligence:   normal  Substance Use:    Patient with history of chronic pain and opiate use and misuse (I.e.   Using alternative strategies of administration from oral).    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   Hypernatremia 01/06/2022   ARDS (adult respiratory distress syndrome) (HCC)  12/28/2021   Pneumonia of left lung due to Pseudomonas species (HCC) 12/27/2021   AKI (acute kidney injury) (HCC) 12/27/2021   Adenopathy 12/25/2021   Coronary artery disease involving native coronary artery of native heart without angina pectoris 12/25/2021   ST elevation myocardial infarction (STEMI) (HCC) 12/24/2021   Respiratory distress 12/22/2021   Acute respiratory failure with hypoxia and hypercapnia (HCC) 12/22/2021   Abnormal EKG 12/22/2021   Electrolyte abnormality 12/22/2021   Tobacco use 01/15/2012    Psychiatric History:  No prior history of psychiatric care.  Patient with Klonopin now for anxiety.  Family Med/Psych History: History reviewed. No pertinent family history.  Impression/DX:  Janet Hensley is a 52 year old female referred for neuropsychological consultation due to coping and adjustment issues with some anxiety features during her ongoing inpatient comprehensive rehabilitation admission.  Patient with long history of chronic pain syndrome and long-term pain medication use and misuse.  These are being addressed during CIR treatment.  Patient has had a very long hospital stay including receiving ECMO and [facility].  Patient initially presented to [facility] on 12/22/2021 with increasing shortness of breath and hypoxic requiring intubation as well as mechanical ventilation.  Patient showed complete whiteout of left lung.  Blood culture and urinalysis showed significant infection.  Infectious disease consulted placed on broad-spectrum antibiotics.  Hospital course was complicated by AKI.  Patient had difficulty weaning off vent and was transferred to [facility] on 12/13 for ongoing ventilation care.  Patient has been decannulated and diet advanced and admitted to the comprehensive inpatient rehabilitation unit due to profound debility and has now been on the rehab service for roughly 10 days with plan to discharge home  in roughly 7 days.   Patient was awake and alert and oriented.  She acknowledged difficulty getting going early in the morning and acknowledged a history of some anxiety but also was identified as being more in the AM.  The patient reports that she feels like she is making significant progress MRI of the specific discharge date is still being determined and there is team conference today the patient is hopeful.  Patient with anxiety over recent chest film but continues to improve and is looking forward to discharge home but what she will need and accomodation for support still being worked out.  Husband works a lot of hours and brother that lives with them not able to help a lot.   Disposition/Plan:  Today we worked on coping and adjustment issues with patient dealing with anxiety and history of chronic pain and adjustment pain medication administration.  Diagnosis:    Adjustment disorder with primary anxiety symptoms and chronic pain       Electronically Signed   _______________________ Ilean Skill, Psy.D. Clinical Neuropsychologist

## 2022-02-21 NOTE — Progress Notes (Signed)
Occupational Therapy Session Note  Patient Details  Name: Janet Hensley MRN: [id number] Date of Birth: [birth year]  Today's Date: 02/21/2022 OT Individual Time: 1330-1415 OT Individual Time Calculation (min): 45 min  and Today's Date: 02/21/2022 OT Missed Time: 15 Minutes Missed Time Reason: Nursing care   Short Term Goals: Week 3:  OT Short Term Goal 1 (Week 3): LTG=STG 2/2 ELOS  Skilled Therapeutic Interventions/Progress Updates:    Pt resting in bed upon arrival with husband, daughter, and brother present for education. Reviewed recommendations for 24 hour supervision. Reviewed DME recommendations (shower seat and BSC.) Reviewed supervision LTGs. Pt's husband performed stand pivot transfers. Pt's husband assisted pt with amb with RW to bathroom and assisted with clothing mgmt. CGA for ambulation. Pt also practiced supine>sit EOB without use of bed rails requiring min A. Pt remained seated EOB with family present. RN and PA present.   Therapy Documentation Precautions:  Precautions Precautions: Fall Restrictions Weight Bearing Restrictions: No General: General OT Amount of Missed Time: 15 Minutes Vital Signs:   Pain:  Pt reports back pain (unrated); meds admin during session   Therapy/Group: Individual Therapy  Leroy Libman 02/21/2022, 2:44 PM

## 2022-02-21 NOTE — Progress Notes (Signed)
Occupational Therapy Session Note  Patient Details  Name: Janet Hensley MRN: [id number] Date of Birth: 21-Mar-1970  Today's Date: 02/21/2022 OT Individual Time: 2353-6144 OT Individual Time Calculation (min): 45 min    Short Term Goals: Week 3:  OT Short Term Goal 1 (Week 3): LTG=STG 2/2 ELOS  Skilled Therapeutic Interventions/Progress Updates:    Pt resting in bed upon arrival. LB dressing at bed level and standing to pull pants over hips. Pt required mod A for pulling pants over hips. UB dressing with supervision seated EOB. Stand pivot transfer to w/c using RW with min A. Sit<>stand with min A from EOB. Standing balance with min A. Pt completed grooming tasks seated in w/c at sink. Pt requires more then a reasonable amount of time to complete tasks with multiple rest breaks. HR 129 after exertion. Pt SOB throughout session necessitating rest breaks. Pt remained in w/c with all needs within reach.   Therapy Documentation Precautions:  Precautions Precautions: Fall Restrictions Weight Bearing Restrictions: No   Pain:  Pt reports her "back hurts"; received meds prior to therapy   Therapy/Group: Individual Therapy  Leroy Libman 02/21/2022, 10:35 AM

## 2022-02-21 NOTE — Progress Notes (Addendum)
Patient ID: Janet Hensley, female   DOB: [birth year], 52 y.o.   MRN: [id number]  SW met with pt, pt husband, pt dtr, and pt brother during OT session. Questions about additional assistance at home. SW explained PCS and CAP/DA with Medicaid and process or private pay for care needs. Hopefully pt will not require either of these services. Pt brother reports he can assist her at home, however, no heavy lifting due to being disabled. Able to transport pt to outpatient if needed. SW confirmed will order recommended DME once all confirmed; so far shower chair and 3in1 BSC.   Loralee Pacas, MSW, [place] Office: [phone] Cell: [phone] Fax: [phone]

## 2022-02-22 DIAGNOSIS — G7281 Critical illness myopathy: Secondary | ICD-10-CM | POA: Diagnosis not present

## 2022-02-22 DIAGNOSIS — J90 Pleural effusion, not elsewhere classified: Secondary | ICD-10-CM | POA: Diagnosis not present

## 2022-02-22 DIAGNOSIS — E876 Hypokalemia: Secondary | ICD-10-CM

## 2022-02-22 DIAGNOSIS — N179 Acute kidney failure, unspecified: Secondary | ICD-10-CM

## 2022-02-22 NOTE — Discharge Summary (Signed)
Physician Discharge Summary  Patient ID: Janet Hensley MRN: [id number] DOB/AGE: [birth year] 52 y.o.  Admit date: 02/06/2022 Discharge date: 02/28/2022  Discharge Diagnoses:  Principal Problem:   Critical illness myopathy Active Problems:   Lower abdominal pain   Rectal bleeding   Anemia   Nausea and vomiting   Abnormal CT scan, gastrointestinal tract   Adjustment disorder with anxious mood DVT prophylaxis Pain management Respiratory failure/tracheostomy-decannulated AKI Prediabetes History of tobacco/polysubstance abuse Dysphagia Colitis Hyponatremia  Discharged Condition: Stable  Significant Diagnostic Studies: DG Abd 1 View  Result Date: 02/25/2022 CLINICAL DATA:  Constipation EXAM: ABDOMEN - 1 VIEW COMPARISON:  02/17/2022 FINDINGS: Scattered large and small bowel gas is noted. No significant retained fecal material is noted. No obstructive changes are seen. No free air is noted. No bony abnormality is seen. IMPRESSION: No evidence of constipation.  No acute abnormality noted. Electronically Signed   By: Inez Catalina M.D.   On: 02/25/2022 21:27   DG CHEST PORT 1 VIEW  Result Date: 02/18/2022 CLINICAL DATA:  SOB EXAM: PORTABLE CHEST 1 VIEW COMPARISON:  02/06/2022 FINDINGS: The heart size and mediastinal contours are within normal limits. Diffuse parenchymal consolidation on the left has improved somewhat in the interval. Left-sided pleural effusion with loculation is again noted. No pneumothorax. Right lung clear. IMPRESSION: Diffuse parenchymal consolidation left hemithorax with some improvement in aeration compared to the prior study. Large left-sided pleural effusion which appears to be loculated again noted. Electronically Signed   By: Sammie Bench M.D.   On: 02/18/2022 14:20   DG Abd 1 View  Result Date: 02/17/2022 CLINICAL DATA:  Constipation EXAM: ABDOMEN - 1 VIEW COMPARISON:  02/09/2022 FINDINGS: Scattered normal stool burden in colon. No bowel dilatation or bowel  wall thickening. Air-filled nondistended small bowel loops in mid abdomen and pelvis. No acute osseous findings or definite urinary tract calcification. Pelvic phleboliths noted. IMPRESSION: Unremarkable bowel gas pattern. Electronically Signed   By: Lavonia Dana M.D.   On: 02/17/2022 15:41   CT ABDOMEN PELVIS W CONTRAST  Result Date: 02/11/2022 CLINICAL DATA:  Lower GI bleed EXAM: CT ABDOMEN AND PELVIS WITH CONTRAST TECHNIQUE: Multidetector CT imaging of the abdomen and pelvis was performed using the standard protocol following bolus administration of intravenous contrast. RADIATION DOSE REDUCTION: This exam was performed according to the departmental dose-optimization program which includes automated exposure control, adjustment of the mA and/or kV according to patient size and/or use of iterative reconstruction technique. CONTRAST:  9mL OMNIPAQUE IOHEXOL 350 MG/ML SOLN COMPARISON:  02/16/2013 FINDINGS: Lower chest: Consolidation in the left lower lobe peripherally. Cannot exclude pneumonia. Tree-in-bud nodular densities in the lower lobes bilaterally, right greater than left. No effusions. Hepatobiliary: 1.7 cm cyst in the right hepatic lobe. No suspicious focal hepatic abnormality. Gallbladder unremarkable. No biliary ductal dilatation. Pancreas: Mild stranding adjacent to pancreatic tail concerning for early focal acute pancreatitis. No ductal dilatation. Spleen: No focal abnormality.  Normal size. Adrenals/Urinary Tract: No adrenal abnormality. No focal renal abnormality. No stones or hydronephrosis. Urinary bladder is unremarkable. Stomach/Bowel: Diffuse wall thickening noted in the rectosigmoid colon concerning for colitis. Normal appendix. Stomach and small bowel decompressed, unremarkable. Vascular/Lymphatic: Aortic atherosclerosis. No evidence of aneurysm or adenopathy. Reproductive: Uterus and adnexa unremarkable.  No mass. Other: No free fluid or free air. Musculoskeletal: No acute bony abnormality.  IMPRESSION: Wall thickening within the rectosigmoid colon concerning for infectious or inflammatory colitis. Slight stranding adjacent to the pancreatic tail raising the possibility of acute focal pancreatitis. Left lower lobe airspace  opacity could reflect atelectasis or pneumonia. Electronically Signed   By: Rolm Baptise M.D.   On: 02/11/2022 18:06   DG Abd 1 View  Result Date: 02/09/2022 CLINICAL DATA:  Nausea and vomiting. EXAM: ABDOMEN - 1 VIEW COMPARISON:  Most recent radiograph yesterday. FINDINGS: Divided portable supine views of the abdomen obtained. There is gaseous gastric distension. No small bowel dilatation or evidence of obstruction. Diminished enteric contrast in the colon. Suggestion of featureless short segment of descending colon. No evidence of free air in the supine views. Left lung consolidation is similar were visualized. IMPRESSION: 1. Gaseous gastric distension. No small bowel dilatation or evidence of obstruction. 2. Suggestion of featureless short segment of descending colon, may represent colitis. Electronically Signed   By: Keith Rake M.D.   On: 02/09/2022 13:35   DG Abd 1 View  Result Date: 02/08/2022 CLINICAL DATA:  Nausea and vomiting EXAM: ABDOMEN - 1 VIEW COMPARISON:  01/27/2022 FINDINGS: 2 supine frontal views of the abdomen and pelvis are obtained. No bowel obstruction or ileus. Oral contrast is seen throughout the colon. No masses or abnormal calcifications. Stable consolidation throughout the left lung. IMPRESSION: 1. Unremarkable bowel gas pattern. 2. Stable left-sided airspace disease. Electronically Signed   By: Randa Ngo M.D.   On: 02/08/2022 15:21   DG CHEST PORT 1 VIEW  Result Date: 02/06/2022 CLINICAL DATA:  Shortness of breath EXAM: PORTABLE CHEST 1 VIEW COMPARISON:  Chest x-ray 01/29/2022.  Chest CT 01/11/2022. FINDINGS: Multifocal airspace consolidation throughout the left lung appears stable. Tracheostomy has been removed. Right lung is clear.  There is no pleural effusion or pneumothorax. The cardiomediastinal silhouette is stable. No acute fractures are seen. IMPRESSION: 1. Stable multifocal airspace consolidation throughout the left lung. 2. Tracheostomy has been removed. Electronically Signed   By: Ronney Asters M.D.   On: 02/06/2022 18:31   DG Chest Port 1 View  Result Date: 01/29/2022 CLINICAL DATA:  Pneumonia. EXAM: PORTABLE CHEST 1 VIEW COMPARISON:  January 26, 2022. FINDINGS: Tracheostomy is unchanged. Stable cardiomediastinal silhouette. Stable left upper and lower lobe airspace opacities are noted consistent with pneumonia. Bony thorax is unremarkable. IMPRESSION: Stable large left lung opacity consistent with pneumonia. Electronically Signed   By: Marijo Conception M.D.   On: 01/29/2022 14:15    Labs:  Basic Metabolic Panel: Recent Labs  Lab 02/21/22 0604 02/24/22 0550  NA 132* 135  K 3.8 3.8  CL 102 102  CO2 22 21*  GLUCOSE 118* 84  BUN 9 8  CREATININE 0.59 0.71  CALCIUM 10.8* 10.8*    CBC: Recent Labs  Lab 02/21/22 0604 02/24/22 0550  WBC 9.9 10.0  NEUTROABS  --  6.4  HGB 9.8* 9.4*  HCT 29.9* 29.5*  MCV 95.5 98.0  PLT 429* 387    CBG: No results for input(s): "GLUCAP" in the last 168 hours.   Family history.  Positive for hypertension as well as hyperlipidemia.  Denies any colon cancer esophageal cancer or rectal cancer  Brief HPI:   Trysta C Buesing is a 52 y.o. right-handed female with history of former tobacco use as well as polysubstance use.  Per chart review lives with her husband and brother.  Independent prior to admission.  1 level home 4 steps to entry.  Presented 12/22/[facility] to [facility] with increasing shortness of breath and vomiting as well as hypoxic 89% requiring intubation as well as mechanical ventilation.  Chest x-ray showed complete whiteout of left lung.  Bronchoscopy showed purulent secretions on  the left which were suctioned out.  Blood culture showed Rothia Mciliaginosa, urine tested  positive for Legionella, bronchoscopy positive for Pseudomonas and strenotrophomas.  ID consulted placed on broad-spectrum antibiotics.  Hospital course complicated by AKI and bicarbonate drip was started.  Patient was transferred to [facility] 12/2 for ARDS management and possible need for ECMO.  Patient did receive ECMO.  Unable to wean off ventilator with tracheostomy placed 12/8.  She was transferred to [facility] 12/13 for ongoing ventilation care.  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  Patient had been decannulated and diet advanced to a dysphagia #2 thin liquid.  Slow wean from oxygen.  AKI resolving latest creatinine 0.42.  She did have bouts of hypotension placed on midodrine.  Subcutaneous heparin for DVT prophylaxis.  Therapy evaluations completed due to patient's profound debility critical illness myopathy was admitted for a comprehensive rehab program.   Hospital Course: Tilda C Tesler was admitted to rehab 02/06/2022 for inpatient therapies to consist of PT, ST and OT at least three hours five days a week. Past admission physiatrist, therapy team and rehab RN have worked together to provide customized collaborative inpatient rehab.  Pertaining to patient's ICU myopathy from ARDS/septic shock she was attending full therapies.  Subcutaneous Lovenox for DVT prophylaxis monitoring for any bleeding episodes.  Pain management with noted history of polysubstance abuse receiving oxycodone 2.5 mg every 6 hours while in the hospital as well as receiving education regards to cessation of any illicit drug use.  Noted bouts of anxiety Klonopin as advised as well as melatonin for sleep with follow-up per neuropsychology.  Emotional support was provided.  Hospital course respiratory failure tracheostomy decannulated oxygen saturations had been weaned from O2.  Latest chest x-ray 02/18/2022 did show diffuse parenchymal consolidation left-sided pleural effusion  improved from latest tracing appeared to be possibly loculated with interventional radiology consulted did not feel this needed to be tapped.  AKI resolved latest creatinine 0.5.  Early on in hospital course she did receive bouts of IV fluids for hydration.  Prediabetes hemoglobin A1c 6.1 blood sugar checks have been discontinued.  Her diet was slowly advanced to mechanical soft thin liquid.  Bouts of constipation/ileus bright red rectal bleeding with CT of abdomen identifying possible colitis with gastroenterology consulted with flex sig completed.  Blood pressure remains soft maintained on midodrine.  Patient with mild hyponatremia 129 improved to 132-135 and would need outpatient follow-up with PCP   Blood pressures were monitored on TID basis and soft and monitored    Rehab course: During patient's stay in rehab weekly team conferences were held to monitor patient's progress, set goals and discuss barriers to discharge. At admission, patient required min mod assist mobility and ADLs  Physical exam.  Blood pressure 104/60 pulse 72 temperature 98 respirations 18 oxygen saturations 92% room air Constitutional.  No acute distress HEENT Head.  Normocephalic and atraumatic Eyes.  Pupils round and reactive to light no discharge without nystagmus Neck.  Supple nontender no JVD without thyromegaly Cardiac regular rate and rhythm without any extra sounds or murmur heard Abdomen.  Soft nontender positive bowel sounds without rebound Respiratory decreased breath sounds at the bases clear to auscultation Skin.  Stage II on left inner buttocks appears to be from pure wick. Neurologic.  Alert provides name and age follows simple commands Upper extremities 4/5 biceps triceps and 4 -/5 wrist extension grip and FA B/L Lower extremities hip flexors 3+/5 knee extension 4 -/5 knee  flexion 3+/5 dorsiflexion 3+/5 plantarflexion 4/5   He/She  has had improvement in activity tolerance, balance, postural control  as well as ability to compensate for deficits. He/She has had improvement in functional use RUE/LUE  and RLE/LLE as well as improvement in awareness.  Working with energy conservation techniques.  Patient performed supine to sit with bed features and cueing for hand placement.  Sit to stand from edge of bed moderate assist.  Transfers wheelchair rolling walker moderate assist.  Perform sit to stand from bilateral handrails with minimal assist.  Propels wheelchair independently.  Ambulates with rolling walker and contact-guard 183 feet.  Full family teaching completed plan discharge to home       Disposition: Discharge to home    Diet: Regular  Special Instructions: No driving smoking alcohol or illicit drug use  Medications for discharge 1.  Tylenol as needed 2.  Klonopin 0.5 mg p.o. 3 times daily 3.  Colace 100 mg p.o. twice daily 4.  Pepcid 20 mg p.o. twice daily 5.  Amitiza 24 mcg p.o. twice daily 6.  Melatonin 3 mg p.o. nightly 7.  ProAmatine 10 mg p.o. 3 times daily 8.  Oxycodone 2.5 mg every 6 hours as needed severe pain dispense of 15 tablets 9.  MiraLAX daily 10.  Senokot 1 tablet nightly 11.Duoneb neb 3 ml every 6 hour as needed  30-35 minutes were spent completing discharge summary and discharge planning  Discharge Instructions     Ambulatory referral to Physical Medicine Rehab   Complete by: As directed    Moderate complexity follow up 1-2 weeks critical illness myopathy        Follow-up Information     Gertie Gowda, DO Follow up.   Specialty: Physical Medicine and Rehabilitation Why: Office to call for appointment Contact information: [address] [place] [address] [phone]         Yetta Flock, MD Follow up.   Specialty: Gastroenterology Why: Call for appointment Contact information: [place] [address] [phone]         August Luz A, PA Follow up.   Specialty: Physician  Assistant Why: Call in 1-2 days for post hospital follow up Contact information: [place] [address] [phone]                 Signed: Lavon Paganini Mina 02/27/2022, 4:53 PM

## 2022-02-22 NOTE — Progress Notes (Signed)
PROGRESS NOTE   Subjective/Complaints:  Reasonable night. Slept well. Seems to be breathing better.   ROS: Patient denies fever, rash, sore throat, blurred vision, dizziness, nausea, vomiting, diarrhea, shortness of breath or chest pain, joint or back/neck pain, headache, or mood change.    Objective:   No results found. Recent Labs    02/21/22 0604  WBC 9.9  HGB 9.8*  HCT 29.9*  PLT 429*   Recent Labs    02/21/22 0604  NA 132*  K 3.8  CL 102  CO2 22  GLUCOSE 118*  BUN 9  CREATININE 0.59  CALCIUM 10.8*    Intake/Output Summary (Last 24 hours) at 02/22/2022 1006 Last data filed at 02/22/2022 0112 Gross per 24 hour  Intake 240 ml  Output 25 ml  Net 215 ml        Physical Exam: Vital Signs Blood pressure 108/63, pulse 89, temperature 97.6 F (36.4 C), resp. rate 19, height 5\' 3"  (1.6 m), weight 70.4 kg, last menstrual period 01/16/2016, SpO2 98 %.   Constitutional: No distress . Vital signs reviewed. HEENT: NCAT, EOMI, oral membranes moist Neck: supple Cardiovascular: RRR without murmur. No JVD    Respiratory/Chest: a few rhonchi. No cough. Normal effort    GI/Abdomen: BS +, non-tender, non-distended Ext: no clubbing, cyanosis, or edema Psych: pleasant and cooperative    Skin: No evidence of breakdown, no evidence of rash. +tracheostomy site closed, open to air.  + buttocks skin tear with aquagel, dressed Neurological: AAOx3. Appropriate.  No apparent deficits. Follows all commands. Musculoskeletal: Moving all 4 limbs in WC antigravity and against resistance.   Assessment/Plan: 1. Functional deficits which require 3+ hours per day of interdisciplinary therapy in a comprehensive inpatient rehab setting. Physiatrist is providing close team supervision and 24 hour management of active medical problems listed below. Physiatrist and rehab team continue to assess barriers to discharge/monitor patient  progress toward functional and medical goals  Care Tool:  Bathing    Body parts bathed by patient: Chest, Abdomen, Right upper leg, Left upper leg, Face   Body parts bathed by helper: Left lower leg, Right lower leg, Front perineal area, Buttocks, Right arm, Left arm     Bathing assist Assist Level: Maximal Assistance - Patient 24 - 49%     Upper Body Dressing/Undressing Upper body dressing   What is the patient wearing?: Pull over shirt    Upper body assist Assist Level: Moderate Assistance - Patient 50 - 74%    Lower Body Dressing/Undressing Lower body dressing      What is the patient wearing?: Pants     Lower body assist Assist for lower body dressing: Maximal Assistance - Patient 25 - 49%     Toileting Toileting    Toileting assist Assist for toileting: Total Assistance - Patient < 25%     Transfers Chair/bed transfer  Transfers assist     Chair/bed transfer assist level: Minimal Assistance - Patient > 75%     Locomotion Ambulation   Ambulation assist      Assist level: Minimal Assistance - Patient > 75% Assistive device: Walker-rolling Max distance: 90'   Walk 10 feet activity   Assist  Walk 10 feet activity did not occur: Safety/medical concerns  Assist level: Minimal Assistance - Patient > 75% Assistive device: Walker-rolling   Walk 50 feet activity   Assist Walk 50 feet with 2 turns activity did not occur: Safety/medical concerns  Assist level: Minimal Assistance - Patient > 75% Assistive device: Walker-rolling    Walk 150 feet activity   Assist Walk 150 feet activity did not occur: Safety/medical concerns         Walk 10 feet on uneven surface  activity   Assist Walk 10 feet on uneven surfaces activity did not occur: Safety/medical concerns         Wheelchair     Assist Is the patient using a wheelchair?: Yes Type of Wheelchair: Manual    Wheelchair assist level: Supervision/Verbal cueing Max wheelchair  distance: 150'    Wheelchair 50 feet with 2 turns activity    Assist        Assist Level: Supervision/Verbal cueing   Wheelchair 150 feet activity     Assist      Assist Level: Supervision/Verbal cueing   Blood pressure 108/63, pulse 89, temperature 97.6 F (36.4 C), resp. rate 19, height 5\' 3"  (1.6 m), weight 70.4 kg, last menstrual period 01/16/2016, SpO2 98 %.  Medical Problem List and Plan: 1. 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  Antithrombotics: -DVT/anticoagulation:  Pharmaceutical: Heparin- will try and change to Lovenox- as long as labs in AM are OK -> HgB stable, Cr WNL, switch to Lovenox inj 40 mg daily              -antiplatelet therapy: N/A 3. Pain Management: Lidoderm patch, Tylenol as needed - stable  - Reports chronic neck pain with Percocet 10 mg at home; was getting Oxy 5 mg Q6H PRN per admission notes; Oxy 2.5 mg Q6H PRN, will wean as tolerated. Cannot prescribe pain medication on discharge d/t documented history of snorting oxycodone; patient aware.   - States she gets gabapentin + Lyrica for peripheral neuropathy at home; not currently bothersome; monitor  1/20- will change bed to air mattress ot help with back pain.   1/22 - reverted back to regular mattress d.t increase back pain  4. Mood/Behavior/Sleep:              -antipsychotic agents: Clonazepam 0.5 mg 3 times daily, melatonin 3 mg nightly  1/23: Encourage compliance with timed toiletting due to reported incontinence d/t fatigue, unwilling to get OOB - improving 1/25  5. Neuropsych/cognition: This patient is capable of making decisions on her own behalf.  -   neuropsych consulted -> saw patient 1/26, appreciate adjustment disorder with primary anxiety and chronic pain; no changes  recommended   -team providing ego support  6. Skin/Wound Care Stage II L inner buttock pressure ulcer- from [place]; tracheostomy opening - will do foam dressing and monitor clinically - Routine skin checks - tracheostomy site; healed, open to air  7. Fluids/Electrolytes/Nutrition: Routine in and outs with follow-up chemistries - Mild low K on admission labs; added K dur 20 meq 1 tab BID; still low increase KCL to 46meq  - 1/15: K 3.3; poor PO with  intermittent refusals; switch to K powder 40 meq BID for 3 days and re-test  - 1/16: DC K phos d/t NPO; labs pending  - 1/17: Had 80 meq IV K overnight for 2.9; 3.3 this AM. Repeat IV 80 meq today with labs in AM. Phos and Mag WNL, no repletion needed. Will resume clear liquid diet today per GI and given maintenance fluids 75 cc/hr NS for 12 hours.   - 1/18: Had 20 meq IV K + 80 meq PO K yesterday, with improvement of K to 3.6 this AM. Continue on 40 meq BID regimen and recheck in AM to ensure no further drop.   - 1/19: K repleted; continue 40 meq BID PO and re-test Monday  1/22: K 3.7; appropriate, continue current repletion.   1/23: K 3.9. Na improved from 129->130 off of IV fluids. Encourage improved POs and repeat labs Friday.   1/26: K 3.8, Na improved to 132 on POs only. Overall stable. No changes, BMP Monday.  - Ca elevated but stable, monitor - Low protein/albumin, adding Boost supplement TID for nutrition and wound healing   8.  Respiratory failure/tracheostomy.  Decannulated 1 week ago- no air leak.  Check oxygen saturations every shift- off O2- current sats 96-97% - Admission: Coarse cough with light green sputum- will check f/u CXR  - 1/11 CXR: Multifocal airspace consolidation throughout the left lung appears stable. R lung clear. Vitals stable on RA. Continue current management.    1/14- WBC up to 17- per #15  1/14: WBC downtrending; likely source UTI  1/17: Afebrile, vitals stable, WBC continues downtrend on UTI Tx. Mild sputum  production, complex pathology on CT abdomen but appears stable from prior images. Low suspicion for recurrent pneumonia, monitor closely  1/23: Duonebs Q8h PRN for SOB added. CXR today for subjective SOB; given improved WBC stable at 10-12 low suspicion for recurrent PNA  1/24: IR to evalaute for potential effusion tap, given re-loculated on CXR -> no tap recommended at this time; monitor and continue duonebs for symptomatic SOB  1/26-27: duonebs, albuterol nebs, and flutter valve for SOB and productive cough   -pt says she's comfortable this am  9.  AKI.  Resolved.  Latest creatinine 0.42.  Follow-up chemistries  - Cr 0.5 this AM; WNL; encourage fluids with energy expenditure and follow  1/14- labs in AM  1/15: Cr stable 0.4; on IVF 75/hr for 24 hours; encouraged patient to drink 6x 250 cc water cups today given increased hydration needs with infection  1/16: DC IVF, labs and vitals stable  1/17: Resume IVF today as above; NPO yesterday; Cr stable  1/19: Dced IV abx, fluids; now tolerating PO. Labs   1/22: Continue IV compazine PRN for nausea, DC IVF d/t hyponatremia; recheck in AM.   1/25: Used compazine IV x1 overnight; d/c IV, po Zofran PRN for nausea, labs in AM  1/26: No Prn, Cr. 0.59 - stable, WNL. No additional interventions  10.  History of tobacco polysubstance use.  Provide counseling  - Patient Hx snorting her prescribed oxycodone; understands no narcotic scripts from hospital at discharge  11.  Prediabetes.  Hemoglobin A1c 6.1.  Blood sugar checks discontinued  12.  Dysphagia.  Dysphagia #2 thin liquids.  Follow-up speech therapy - stable  1/16: NPO except sips with meds and ice chips d/t concern for colitis, awaiting GI eval  1/17: Clear liquid diet today, NPO at midnight for scope.   1/18: Cleared by GI to resume diet  - Dys  3 with thins 1/19 Diet Orders (From admission, onward)     Start     Ordered   02/14/22 1206  DIET DYS 3 Room service appropriate? Yes; Fluid  consistency: Thin  Diet effective now       Question Answer Comment  Room service appropriate? Yes   Fluid consistency: Thin      02/14/22 1205             13. Constipation- ongoing Last recorded BM 2 d ago , will give glycerin supp, await KUB  1/14- had moderate stool burden- given SMOG enema last night- pt having bright red clots since then  1/15: Educated patient on intermittent refusals of bowel meds  1/17: Continue BID miralax 17 g per GI recs; bowel PREP today for scope in AM  1/18: BID miralax per GI; had multiple enemas for bowel prep  1/20- no BM in 3 days- said miralax and Sorbitol make her nauseated- will try breaking up sorbitol and giving compazine prn to help- if not, can try Lactulose if need be, but that can also make pt's nauseated as well  - 1/22: Amitiza per home regimen, suppository PRN pending KUB showed scattered stool, no acute findings. Resume miralax BID when tolerating POs.   1/23: Encouraging POS, changed Miralax to Colace 100 mg BID, also drinking prune juice. Did have small BM with suppository overnight.  1/24: Small BM overnight; continue current regimen 1/25: added Senna 7.6 mg 1 tab QHS d/t poor miralax tolerance.  1/27 last bm 1/26--continue current regimen  14. BRBPR w/ anemia, colitis - resolved  1/14- Hb is actually up from 9.1 to 11- so think blood loss is negligible- however will make sure has labs in AM as well.   1/15: HgB 9.5; H/H repeat ordered in AM d/t likely hemoconcentrated with prior 11 and no further BRBPR   1/16: 2x liquis red mucusy stools overnight. KUB 1/14 with ?colitis. Patient made NPO, awaiting GI evaluation, appreciate input. Crp, CMP, and CBC w diff ordered.  Remains N/V with PO Zofran and IV compazine Prn available.   1/17: GI consulted, appreciate their expertise and input. CT abdomen overnight with results showing ?rectosigmoid colitis. Unlikely infectious. Cleared for clear liquid diet today, patient to have colonoscopy  vs. Flex sigmoidoscopy in AM; +/- EGD depending on iron panel.   1/18: See procedure note. Nonspecific colitis observed on flex sig. Biopsies pending. Per GI, cleared for PO diet with gentle miralax 17 g BID bowel regimen. Iron panel with low TIBC, high ferritin = likely anemia chronic disease, low suspicion for acute bleed.    15. Leukocytosis - Uti +/- colitis - resolved  1/14- WBC up to 17k from 11k- will check U/A and Cx but if that's negative, think the significant pleural effusion/multiloculated cosolidation on L lung could be getting infected- if need be, wil call ID to discuss- cannot see if this was treatment in [facility] after 1/1 when WBC went up to [facility]  1/15: See above; likely UTI started Keflex 500 mg q8 hours and since not voiding a lot, will start NS IVFs 75cc hour for 1 day -> switched to Rocephin 1 G daily d/t poor PO tolerance, plan to switch back to PO once improved  1/16: Continue IV abx d/t NPO, N/V; awaiting sensitivities  1/17: WBC improving; continue IV Abx until post-scope and then advance to PO   1/18: Out to procedure today, switch to PO tomorrow AM with labs  1/19: Switched to ampicillin 500  mg TID PO for 3 days; WBC WNL - finished regimen  1/22: Mildly increase WBC today, will recheck in AM and if increasing further and/or fever will do full infectious workup with CXR, respiratory culture, repeat UA, Bcx2  1/26 labs pending for monday  17. Tachycardia - mild , asymptomatic - stable Vitals:   02/22/22 0431 02/22/22 0940  BP: 108/63   Pulse: 89   Resp: 19   Temp: 97.6 F (36.4 C)   SpO2: 97% 98%      LOS: 16 days A FACE TO FACE EVALUATION WAS PERFORMED  Meredith Staggers 02/22/2022, 10:06 AM

## 2022-02-22 NOTE — Progress Notes (Signed)
SLP Cancellation Note  Patient Details Name: Janet Hensley MRN: [id number] DOB: [birth year]   Cancelled treatment:             Upon entry, pt was laying in bed surrounded by family. Pt reported she did not feel well and felt like throwing up. She said she was going to get another zofran to help later. She did not want to participate today.                                                                                            Verdene Lennert 02/22/2022, 3:02 PM

## 2022-02-22 NOTE — Progress Notes (Signed)
Occupational Therapy Session Note  Patient Details  Name: Janet Hensley MRN: [id number] Date of Birth: 12-20-1970  Today's Date: 02/22/2022 OT Individual Time: 1100-1200 OT Individual Time Calculation (min): 60 min    Short Term Goals: Week 1:  OT Short Term Goal 1 (Week 1): Patient will complete toilet transfer with mod A  of 1 OT Short Term Goal 1 - Progress (Week 1): Progressing toward goal OT Short Term Goal 2 (Week 1): Patient will complete 1 step of toileting task OT Short Term Goal 2 - Progress (Week 1): Met OT Short Term Goal 3 (Week 1): Patient will stand at the sink with mod A of 1 in preparation for BADL Task. OT Short Term Goal 3 - Progress (Week 1): Progressing toward goal  Skilled Therapeutic Interventions/Progress Updates:   The pt was in bed upon arrival and indicated that she would like to work on BADL related task in bathing and dressing at sink LOF.  The pt had no report of pain at the time of OT treatment and she indicated that she rested well last night. The pt was able to come from supine in bed to EOB with SBA, she was able to complete a stand pivot transfer with MinA to the w/c The pt was able to donn her shoes with s/u assist. The pt was able to transfer from the w/c to the commode using the grab bars for additional balance, incorporating a stand pivot transfer with MinA, she was able to complete toileting and return to the w/c with CGA..  The pt was able to doff her LB garments, a brief and her overhead shirt while positioned at the sink area with SBA.  The pt was able to complete UB/LB bathing with close S , she required CGA for coming from sit to stand to bathing her perineal and bottom. The pt was able to donn her over head shirt with Ind and her LB garments a brief with MinA for managing fastners and her pants Ind.  The pt was able to brush her teeth at sink level Ind, she was able to wet and dry her hair with s/u assist. The pt  remained at w/c LOF with her bed side  table and call light within reach, the chair alarm was activated all additional needs were addressed.  Therapy Documentation Precautions:  Precautions Precautions: Fall Restrictions Weight Bearing Restrictions: No   Therapy/Group: Individual Therapy  Yvonne Kendall 02/22/2022, 3:35 PM

## 2022-02-23 DIAGNOSIS — E876 Hypokalemia: Secondary | ICD-10-CM | POA: Diagnosis not present

## 2022-02-23 DIAGNOSIS — N179 Acute kidney failure, unspecified: Secondary | ICD-10-CM | POA: Diagnosis not present

## 2022-02-23 DIAGNOSIS — G7281 Critical illness myopathy: Secondary | ICD-10-CM | POA: Diagnosis not present

## 2022-02-23 DIAGNOSIS — J90 Pleural effusion, not elsewhere classified: Secondary | ICD-10-CM | POA: Diagnosis not present

## 2022-02-23 NOTE — Progress Notes (Addendum)
PROGRESS NOTE   Subjective/Complaints:  Had nausea throughout the day yesterday.took zofran with some relief. Feels better this am. Not sure what triggered. Missed one session of therapy as result.   ROS: Patient denies fever, rash, sore throat, blurred vision, dizziness,   diarrhea, cough, shortness of breath or chest pain, joint or back/neck pain, headache, or mood change.    Objective:   No results found. Recent Labs    02/21/22 0604  WBC 9.9  HGB 9.8*  HCT 29.9*  PLT 429*   Recent Labs    02/21/22 0604  NA 132*  K 3.8  CL 102  CO2 22  GLUCOSE 118*  BUN 9  CREATININE 0.59  CALCIUM 10.8*    Intake/Output Summary (Last 24 hours) at 02/23/2022 0914 Last data filed at 02/23/2022 0315 Gross per 24 hour  Intake 220 ml  Output 125 ml  Net 95 ml        Physical Exam: Vital Signs Blood pressure 116/67, pulse 93, temperature 97.9 F (36.6 C), temperature source Oral, resp. rate 18, height 5\' 3"  (1.6 m), weight 70.4 kg, last menstrual period 01/16/2016, SpO2 93 %.   Constitutional: No distress . Vital signs reviewed. HEENT: NCAT, EOMI, oral membranes moist Neck: supple Cardiovascular: RRR without murmur. No JVD    Respiratory/Chest: CTA Bilaterally without wheezes or rales. Normal effort    GI/Abdomen: BS +, non-tender, non-distended Ext: no clubbing, cyanosis, or edema Psych: pleasant and cooperative  Skin: No evidence of breakdown, no evidence of rash. +tracheostomy site closed, open to air.  + buttocks skin tear with aquagel, remains dressed Neurological: AAOx3. Appropriate.  No apparent deficits. Follows all commands. Musculoskeletal: Moving all 4 limbs in WC antigravity and against resistance.   Assessment/Plan: 1. Functional deficits which require 3+ hours per day of interdisciplinary therapy in a comprehensive inpatient rehab setting. Physiatrist is providing close team supervision and 24 hour  management of active medical problems listed below. Physiatrist and rehab team continue to assess barriers to discharge/monitor patient progress toward functional and medical goals  Care Tool:  Bathing    Body parts bathed by patient: Chest, Abdomen, Right upper leg, Left upper leg, Face   Body parts bathed by helper: Left lower leg, Right lower leg, Front perineal area, Buttocks, Right arm, Left arm     Bathing assist Assist Level: Maximal Assistance - Patient 24 - 49%     Upper Body Dressing/Undressing Upper body dressing   What is the patient wearing?: Pull over shirt    Upper body assist Assist Level: Moderate Assistance - Patient 50 - 74%    Lower Body Dressing/Undressing Lower body dressing      What is the patient wearing?: Pants     Lower body assist Assist for lower body dressing: Maximal Assistance - Patient 25 - 49%     Toileting Toileting    Toileting assist Assist for toileting: Total Assistance - Patient < 25%     Transfers Chair/bed transfer  Transfers assist     Chair/bed transfer assist level: Minimal Assistance - Patient > 75%     Locomotion Ambulation   Ambulation assist      Assist level: Minimal  Assistance - Patient > 75% Assistive device: Walker-rolling Max distance: 90'   Walk 10 feet activity   Assist  Walk 10 feet activity did not occur: Safety/medical concerns  Assist level: Minimal Assistance - Patient > 75% Assistive device: Walker-rolling   Walk 50 feet activity   Assist Walk 50 feet with 2 turns activity did not occur: Safety/medical concerns  Assist level: Minimal Assistance - Patient > 75% Assistive device: Walker-rolling    Walk 150 feet activity   Assist Walk 150 feet activity did not occur: Safety/medical concerns         Walk 10 feet on uneven surface  activity   Assist Walk 10 feet on uneven surfaces activity did not occur: Safety/medical concerns         Wheelchair     Assist Is  the patient using a wheelchair?: Yes Type of Wheelchair: Manual    Wheelchair assist level: Supervision/Verbal cueing Max wheelchair distance: 150'    Wheelchair 50 feet with 2 turns activity    Assist        Assist Level: Supervision/Verbal cueing   Wheelchair 150 feet activity     Assist      Assist Level: Supervision/Verbal cueing   Blood pressure 116/67, pulse 93, temperature 97.9 F (36.6 C), temperature source Oral, resp. rate 18, height 5\' 3"  (1.6 m), weight 70.4 kg, last menstrual period 01/16/2016, SpO2 93 %.  Medical Problem List and Plan: 1. 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  Antithrombotics: -DVT/anticoagulation:  Pharmaceutical: Heparin- will try and change to Lovenox- as long as labs in AM are OK -> HgB stable, Cr WNL, switch to Lovenox inj 40 mg daily              -antiplatelet therapy: N/A 3. Pain Management: Lidoderm patch, Tylenol as needed - stable  - Reports chronic neck pain with Percocet 10 mg at home; was getting Oxy 5 mg Q6H PRN per admission notes; Oxy 2.5 mg Q6H PRN, will wean as tolerated. Cannot prescribe pain medication on discharge d/t documented history of snorting oxycodone; patient aware.   - States she gets gabapentin + Lyrica for peripheral neuropathy at home; not currently bothersome; monitor  1/20- will change bed to air mattress ot help with back pain.   1/22 - reverted back to regular mattress d.t increase back pain  4. Mood/Behavior/Sleep:              -antipsychotic agents: Clonazepam 0.5 mg 3 times daily, melatonin 3 mg nightly  1/23: Encourage compliance with timed toiletting due to reported incontinence d/t fatigue, unwilling to get OOB - improving 1/27-28  5. Neuropsych/cognition: This patient is capable of  making decisions on her own behalf.  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 Skin/Wound Care Stage II L inner buttock pressure ulcer- from [place]; tracheostomy opening - will do foam dressing and monitor clinically - Routine skin checks - tracheostomy site; healed, open to air  7. Fluids/Electrolytes/Nutrition: Routine  in and outs with follow-up chemistries - Mild low K on admission labs; added K dur 20 meq 1 tab BID; still low increase KCL to  - 1/15: K 3.3; poor PO with intermittent refusals; switch to K powder 40 meq BID for 3 days and re-test  - 1/16: DC K phos d/t NPO; labs pending  - 1/17: Had 80 meq IV K overnight for 2.9; 3.3 this AM. Repeat IV 80 meq today with labs in AM. Phos and Mag WNL, no repletion needed. Will resume clear liquid diet today per GI and given maintenance fluids 75 cc/hr NS for 12 hours.   - 1/18: Had 20 meq IV K + 80 meq PO K yesterday, with improvement of K to 3.6 this AM. Continue on 40 meq BID regimen and recheck in AM to ensure no further drop.   - 1/19: K repleted; continue 40 meq BID PO and re-test Monday  1/22: K 3.7; appropriate, continue current repletion.   1/23: K 3.9. Na improved from 129->130 off of IV fluids. Encourage improved POs and repeat labs Friday.   1/26: K 3.8, Na improved to 132 on POs only. Overall stable. No changes, BMP Monday.  - Ca elevated but stable, monitor - Low protein/albumin, added Boost supplement TID for nutrition and wound healing   8.  Respiratory failure/tracheostomy.  Decannulated 1 week ago- no air leak.  Check oxygen saturations every shift- off O2- current sats 96-97% - Admission: Coarse cough with light green sputum- will check f/u CXR  - 1/11 CXR: Multifocal airspace consolidation throughout the left lung appears stable.  R lung clear. Vitals stable on RA. Continue current management.    1/14- WBC up to 17- per #15  1/14: WBC downtrending; likely source UTI  1/17: Afebrile, vitals stable, WBC continues downtrend on UTI Tx. Mild sputum production, complex pathology on CT abdomen but appears stable from prior images. Low suspicion for recurrent pneumonia, monitor closely  1/23: Duonebs Q8h PRN for SOB added. CXR today for subjective SOB; given improved WBC stable at 10-12 low suspicion for recurrent PNA  1/24: IR to evalaute for potential effusion tap, given re-loculated on CXR -> no tap recommended at this time; monitor and continue duonebs for symptomatic SOB  1/26-27: duonebs, albuterol nebs, and flutter valve for SOB and productive cough   -pt says she's comfortable this am  9.  AKI.  Resolved.  Latest creatinine 0.42.  Follow-up chemistries  - Cr 0.5 this AM; WNL; encourage fluids with energy expenditure and follow  1/14- labs in AM  1/15: Cr stable 0.4; on IVF 75/hr for 24 hours; encouraged patient to drink 6x 250 cc water cups today given increased hydration needs with infection  1/16: DC IVF, labs and vitals stable  1/17: Resume IVF today as above; NPO yesterday; Cr stable  1/19: Dced IV abx, fluids; now tolerating PO. Labs   1/22: Continue IV compazine PRN for nausea, DC IVF d/t hyponatremia; recheck in AM.   1/25: Used compazine IV x1 overnight; d/c IV, po Zofran PRN for nausea, labs in AM  1/26: No Prn, Cr. 0.59 - stable, WNL. No additional interventions  10.  History of tobacco polysubstance use.  Provide counseling  - Patient Hx snorting her prescribed oxycodone; understands no narcotic scripts from hospital at discharge  11.  Prediabetes.  Hemoglobin A1c 6.1.  Blood sugar checks discontinued  12.  Dysphagia.  Dysphagia #2 thin liquids.  Follow-up speech therapy - stable  1/16: NPO except sips  with meds and ice chips d/t concern for colitis, awaiting GI eval  1/17: Clear liquid diet today, NPO at  midnight for scope.   1/18: Cleared by GI to resume diet  - Dys 3 with thins 1/19 Diet Orders (From admission, onward)     Start     Ordered   02/14/22 1206  DIET DYS 3 Room service appropriate? Yes; Fluid consistency: Thin  Diet effective now       Question Answer Comment  Room service appropriate? Yes   Fluid consistency: Thin      02/14/22 1205             13. Constipation- ongoing Last recorded BM 2 d ago , will give glycerin supp, await KUB  1/14- had moderate stool burden- given SMOG enema last night- pt having bright red clots since then  1/15: Educated patient on intermittent refusals of bowel meds  1/17: Continue BID miralax 17 g per GI recs; bowel PREP today for scope in AM  1/18: BID miralax per GI; had multiple enemas for bowel prep  1/20- no BM in 3 days- said miralax and Sorbitol make her nauseated- will try breaking up sorbitol and giving compazine prn to help- if not, can try Lactulose if need be, but that can also make pt's nauseated as well  - 1/22: Amitiza per home regimen, suppository PRN pending KUB showed scattered stool, no acute findings. Resume miralax BID when tolerating POs.   1/23: Encouraging POS, changed Miralax to Colace 100 mg BID, also drinking prune juice. Did have small BM with suppository overnight.  1/24: Small BM overnight; continue current regimen 1/25: added Senna 7.6 mg 1 tab QHS d/t poor miralax tolerance.  1/28 last bm 1/26--continue current regimen  -only scheduled med she takes which could contribute to nausea is amitiza, but she was taking this pta. Oxycodone could certainly be causing as well.   -likely anxiety component to nausea, too.  -generally is eating better 14. BRBPR w/ anemia, colitis - resolved  1/14- Hb is actually up from 9.1 to 11- so think blood loss is negligible- however will make sure has labs in AM as well.   1/15: HgB 9.5; H/H repeat ordered in AM d/t likely hemoconcentrated with prior 11 and no further BRBPR    1/16: 2x liquis red mucusy stools overnight. KUB 1/14 with ?colitis. Patient made NPO, awaiting GI evaluation, appreciate input. Crp, CMP, and CBC w diff ordered.  Remains N/V with PO Zofran and IV compazine Prn available.   1/17: GI consulted, appreciate their expertise and input. CT abdomen overnight with results showing ?rectosigmoid colitis. Unlikely infectious. Cleared for clear liquid diet today, patient to have colonoscopy vs. Flex sigmoidoscopy in AM; +/- EGD depending on iron panel.   1/18: See procedure note. Nonspecific colitis observed on flex sig. Biopsies pending. Per GI, cleared for PO diet with gentle miralax 17 g BID bowel regimen. Iron panel with low TIBC, high ferritin = likely anemia chronic disease, low suspicion for acute bleed.    15. Leukocytosis - Uti +/- colitis - resolved  1/14- WBC up to 17k from 11k- will check U/A and Cx but if that's negative, think the significant pleural effusion/multiloculated cosolidation on L lung could be getting infected- if need be, wil call ID to discuss- cannot see if this was treatment in [facility] after 1/1 when WBC went up to [facility]  1/15: See above; likely UTI started Keflex 500 mg q8 hours and since not voiding a  lot, will start NS IVFs 75cc hour for 1 day -> switched to Rocephin 1 G daily d/t poor PO tolerance, plan to switch back to PO once improved  1/16: Continue IV abx d/t NPO, N/V; awaiting sensitivities  1/17: WBC improving; continue IV Abx until post-scope and then advance to PO   1/18: Out to procedure today, switch to PO tomorrow AM with labs  1/19: Switched to ampicillin 500 mg TID PO for 3 days; WBC WNL - finished regimen  1/22: Mildly increase WBC today, will recheck in AM and if increasing further and/or fever will do full infectious workup with CXR, respiratory culture, repeat UA, Bcx2  1/28 labs pending for monday  17. Tachycardia - mild , asymptomatic - stable Vitals:   02/22/22 1933 02/23/22 0330  BP: 114/70 116/67   Pulse: 95 93  Resp: 18 18  Temp: 98.2 F (36.8 C) 97.9 F (36.6 C)  SpO2: 98% 93%      LOS: 17 days A FACE TO FACE EVALUATION WAS PERFORMED  Ranelle Oyster 02/23/2022, 9:14 AM

## 2022-02-23 NOTE — Progress Notes (Signed)
Occupational Therapy Session Note  Patient Details  Name: Janet Hensley MRN: [id number] Date of Birth: [birth year]  Today's Date: 02/23/2022 OT Individual Time: 1015-1110 OT Individual Time Calculation (min): 55 min    Short Term Goals: Week 3:  OT Short Term Goal 1 (Week 3): LTG=STG 2/2 ELOS  Skilled Therapeutic Interventions/Progress Updates:    Patient semi reclined in bed, reports she feels a little better than yesterday.  Requesting to bathe at shower level to wash her hair.  Completed supine to sit using bed rail with close supervision.  Stand pivot using RW  to w/c, with mod assist for power up. Patient retrieved ADL items at w/c level then transported to bathroom for time mgt.  Stand pivot using grab bars with min-mod assist.  Patient bathed primarily sitting on shower bench and able to complete figure 4 to bathe feet.  Patient did require min assist to stand and turn facing grab bar with unilateral release to wash buttocks per patient request.  Stand pivot to w/c with min assist for slowing of descent as well. Patient donned shirt with setup and brief and pants with min-mod assist at sit<>stand at sink.  Left patient sitting at sink with husband combing her hair, secondary to husband has been cleared by therapies to assist patient with mobility needs.  Nurse notified of patients request for breathing treatment. Therapy Documentation Precautions:  Precautions Precautions: Fall Restrictions Weight Bearing Restrictions: No    Therapy/Group: Individual Therapy  Ezekiel Slocumb 02/23/2022, 8:22 AM

## 2022-02-23 NOTE — Progress Notes (Signed)
Speech Language Pathology Daily Session Note  Patient Details  Name: Janet Hensley MRN: [id number] Date of Birth: [birth year]  Today's Date: 02/23/2022 SLP Individual Time: 0812-0857 SLP Individual Time Calculation (min): 45 min  Short Term Goals: Week 2: SLP Short Term Goal 1 (Week 2): Patient will recall new, daily information with supervision verbal and visual cues for use of compensatory strategies. SLP Short Term Goal 1 - Progress (Week 2): Met SLP Short Term Goal 2 (Week 2): Patient will demonstrate functional problem solving for mildly complex tasks with Supervision verbal cues. SLP Short Term Goal 2 - Progress (Week 2): Met SLP Short Term Goal 3 (Week 2): Patient will demonstrate efficient mastication with complete oral clearance with trials of regular textures without overt s/s of aspiration with Mod I over 2 sessions prior to upgrade. SLP Short Term Goal 3 - Progress (Week 2): Not met  Skilled Therapeutic Interventions:   Pt seen for skilled SLP session to address dysphagia and cognitive goals. Pt observed with Dys 3 breakfast tray. Set up completed by nursing staff. Pt able to feed self. She consumed thin liquids and mechanical soft textures with no overt or subtle s/s of aspiration observed. RN present to give medications and reported that pt has been able to safely take some smaller medications whole with liquids. Pt reported some distaste of foods on current diet. Unclear if diet advancement may help if advancement is indicated per primary SLP. Pt did report going to [employer] with her family and ordering regular solids. Short term recall: SLP prompted immediate and delayed recall task of unfamiliar addresses. Short term recall completed with ~75% accuracy with min-mod cues. Pt more challenged with delayed recall of addresses completing task with ~50% accuracy. Pt alternated attention between short term memory task and safety awareness task. Pt required min cues to answer safety  awareness questions completely with >90% accuracy. Pt left sitting up in bed with alarm set and call bell within reach. Continue SLP PoC.  Pain Pain Assessment Pain Scale: 0-10 Pain Score: 0-No pain  Therapy/Group: Individual Therapy  Wyn Forster 02/23/2022, 8:24 AM

## 2022-02-24 DIAGNOSIS — G7281 Critical illness myopathy: Secondary | ICD-10-CM | POA: Diagnosis not present

## 2022-02-24 LAB — COMPREHENSIVE METABOLIC PANEL
ALT: 34 U/L (ref 0–44)
AST: 23 U/L (ref 15–41)
Albumin: 2.1 g/dL — ABNORMAL LOW (ref 3.5–5.0)
Alkaline Phosphatase: 118 U/L (ref 38–126)
Anion gap: 12 (ref 5–15)
BUN: 8 mg/dL (ref 6–20)
CO2: 21 mmol/L — ABNORMAL LOW (ref 22–32)
Calcium: 10.8 mg/dL — ABNORMAL HIGH (ref 8.9–10.3)
Chloride: 102 mmol/L (ref 98–111)
Creatinine, Ser: 0.71 mg/dL (ref 0.44–1.00)
GFR, Estimated: >60 mL/min (ref >60)
Glucose, Bld: 84 mg/dL (ref 70–99)
Potassium: 3.8 mmol/L (ref 3.5–5.1)
Sodium: 135 mmol/L (ref 135–145)
Total Bilirubin: 0.3 mg/dL (ref 0.3–1.2)
Total Protein: 5.9 g/dL — ABNORMAL LOW (ref 6.5–8.1)

## 2022-02-24 LAB — CBC WITH DIFFERENTIAL/PLATELET
Abs Immature Granulocytes: 0.06 10*3/uL (ref 0.00–0.07)
Basophils Absolute: 0.1 10*3/uL (ref 0.0–0.1)
Basophils Relative: 1 %
Eosinophils Absolute: 0.2 10*3/uL (ref 0.0–0.5)
Eosinophils Relative: 2 %
HCT: 29.5 % — ABNORMAL LOW (ref 36.0–46.0)
Hemoglobin: 9.4 g/dL — ABNORMAL LOW (ref 12.0–15.0)
Immature Granulocytes: 1 %
Lymphocytes Relative: 24 %
Lymphs Abs: 2.3 10*3/uL (ref 0.7–4.0)
MCH: 31.2 pg (ref 26.0–34.0)
MCHC: 31.9 g/dL (ref 30.0–36.0)
MCV: 98 fL (ref 80.0–100.0)
Monocytes Absolute: 0.9 10*3/uL (ref 0.1–1.0)
Monocytes Relative: 9 %
Neutro Abs: 6.4 10*3/uL (ref 1.7–7.7)
Neutrophils Relative %: 63 %
Platelets: 387 10*3/uL (ref 150–400)
RBC: 3.01 MIL/uL — ABNORMAL LOW (ref 3.87–5.11)
RDW: 15 % (ref 11.5–15.5)
WBC: 10 10*3/uL (ref 4.0–10.5)
nRBC: 0 % (ref 0.0–0.2)

## 2022-02-24 LAB — URINALYSIS, ROUTINE W REFLEX MICROSCOPIC
Bilirubin Urine: NEGATIVE
Glucose, UA: NEGATIVE mg/dL
Hgb urine dipstick: NEGATIVE
Ketones, ur: NEGATIVE mg/dL
Leukocytes,Ua: NEGATIVE
Nitrite: NEGATIVE
Protein, ur: NEGATIVE mg/dL
Specific Gravity, Urine: 1.004 — ABNORMAL LOW (ref 1.005–1.030)
pH: 5 (ref 5.0–8.0)

## 2022-02-24 MED ORDER — POLYETHYLENE GLYCOL 3350 17 G PO PACK: 17.0000 g | PACK | Freq: Two times a day (BID) | ORAL | Status: DC

## 2022-02-24 MED ORDER — POLYETHYLENE GLYCOL 3350 17 G PO PACK
17.0000 g | PACK | Freq: Every day | ORAL | Status: DC
  Administered 2022-02-25 – 2022-02-26 (×2): 17 g via ORAL
  Filled 2022-02-24 (×4): qty 1

## 2022-02-24 NOTE — Progress Notes (Addendum)
Patient ID: Janet Hensley, female   DOB: 08-30-1970, 51 y.o.   MRN: [id number]  SW ordered DME: RW, 3in1 BSC, shower chair with back, and w/c with [employer] via parachute.   SW spoke with pt husband Gerald Stabs to inform on DME ordered. Discussed SSDI. SW shared will follow-up if there is a company that can assist. Like will get an attorney to address since they have applied several times and she has been denied. SW discussed outpatient PT/OT/SLP. Prefers [facility] [phone]).  SW left message for pt husband requesting follow-up after speaking with [facility] to discuss if they can assist, and will need referral signed.   *SW ordered nebulizer and Rx (duoneb) with [employer] via parachute.   SW spoke with [facility] [phone]) and confirms referral has been received.   Loralee Pacas, MSW, [place] Office: [phone] Cell: [phone] Fax: [phone]

## 2022-02-24 NOTE — Progress Notes (Signed)
PROGRESS NOTE   Subjective/Complaints:  Patient reports she has not had nausea, but abdominal discomfort in the lower middle region. Has had regular Bms, most recently yesterday. Denies vomitting. Does not have Hx recurrent UTIs, but has noted some burning on urination.   Patient requesting to speak with SW regarding disability application process.   ROS: Patient denies fever, rash, sore throat, blurred vision, dizziness,   diarrhea, cough, shortness of breath or chest pain, joint or back/neck pain, headache, or mood change.    Objective:   No results found. Recent Labs    02/24/22 0550  WBC 10.0  HGB 9.4*  HCT 29.5*  PLT 387    Recent Labs    02/24/22 0550  NA 135  K 3.8  CL 102  CO2 21*  GLUCOSE 84  BUN 8  CREATININE 0.71  CALCIUM 10.8*     Intake/Output Summary (Last 24 hours) at 02/24/2022 1055 Last data filed at 02/24/2022 0845 Gross per 24 hour  Intake 596 ml  Output --  Net 596 ml         Physical Exam: Vital Signs Blood pressure 122/64, pulse 94, temperature 97.7 F (36.5 C), temperature source Oral, resp. rate 18, height 5\' 3"  (1.6 m), weight 70.4 kg, last menstrual period 01/16/2016, SpO2 98 %.   Constitutional: No distress . Vital signs reviewed. Sitting in WC at bedside.  HEENT: NCAT, EOMI, oral membranes moist Neck: supple Cardiovascular: RRR without murmur. No JVD    Respiratory/Chest: CTA Bilaterally without wheezes or rales. +breathy vocal quality. +Reduced breath sounds in LLL. +grey sputum with cough, mild - unchanged  GI/Abdomen: BS +, non-tender, non-distended Ext: no clubbing, cyanosis, or edema Psych: pleasant and cooperative  Skin: No evidence of breakdown, no evidence of rash. +tracheostomy site closed, open to air.  + buttocks skin tear with aquagel  Neurological: AAOx3. Appropriate.  No apparent deficits. Follows all commands. Musculoskeletal: Moving all 4 limbs in WC  antigravity and against resistance. + Bilateral plantarflexion tone, can PROM to neutral without discomfort.    Assessment/Plan: 1. Functional deficits which require 3+ hours per day of interdisciplinary therapy in a comprehensive inpatient rehab setting. Physiatrist is providing close team supervision and 24 hour management of active medical problems listed below. Physiatrist and rehab team continue to assess barriers to discharge/monitor patient progress toward functional and medical goals  Care Tool:  Bathing    Body parts bathed by patient: Chest, Abdomen, Right upper leg, Left upper leg, Face   Body parts bathed by helper: Left lower leg, Right lower leg, Front perineal area, Buttocks, Right arm, Left arm     Bathing assist Assist Level: Maximal Assistance - Patient 24 - 49%     Upper Body Dressing/Undressing Upper body dressing   What is the patient wearing?: Pull over shirt    Upper body assist Assist Level: Moderate Assistance - Patient 50 - 74%    Lower Body Dressing/Undressing Lower body dressing      What is the patient wearing?: Pants     Lower body assist Assist for lower body dressing: Maximal Assistance - Patient 25 - 49%     Toileting Toileting  Toileting assist Assist for toileting: Total Assistance - Patient < 25%     Transfers Chair/bed transfer  Transfers assist     Chair/bed transfer assist level: Minimal Assistance - Patient > 75%     Locomotion Ambulation   Ambulation assist      Assist level: Minimal Assistance - Patient > 75% Assistive device: Walker-rolling Max distance: 90'   Walk 10 feet activity   Assist  Walk 10 feet activity did not occur: Safety/medical concerns  Assist level: Minimal Assistance - Patient > 75% Assistive device: Walker-rolling   Walk 50 feet activity   Assist Walk 50 feet with 2 turns activity did not occur: Safety/medical concerns  Assist level: Minimal Assistance - Patient >  75% Assistive device: Walker-rolling    Walk 150 feet activity   Assist Walk 150 feet activity did not occur: Safety/medical concerns         Walk 10 feet on uneven surface  activity   Assist Walk 10 feet on uneven surfaces activity did not occur: Safety/medical concerns         Wheelchair     Assist Is the patient using a wheelchair?: Yes Type of Wheelchair: Manual    Wheelchair assist level: Supervision/Verbal cueing Max wheelchair distance: 150'    Wheelchair 50 feet with 2 turns activity    Assist        Assist Level: Supervision/Verbal cueing   Wheelchair 150 feet activity     Assist      Assist Level: Supervision/Verbal cueing   Blood pressure 122/64, pulse 94, temperature 97.7 F (36.5 C), temperature source Oral, resp. rate 18, height 5\' 3"  (1.6 m), weight 70.4 kg, last menstrual period 01/16/2016, SpO2 98 %.  Medical Problem List and Plan: 1. 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  Antithrombotics: -DVT/anticoagulation:  Pharmaceutical: Heparin- will try and change to Lovenox- as long as labs in AM are OK -> HgB stable, Cr WNL, switch to Lovenox inj 40 mg daily              -antiplatelet therapy: N/A 3. Pain Management: Lidoderm patch, Tylenol as needed - stable  - Reports chronic neck pain with Percocet 10 mg at home; was getting Oxy 5 mg Q6H PRN per admission notes; Oxy 2.5 mg Q6H PRN, will wean as tolerated. Cannot prescribe pain medication on discharge d/t documented history of snorting oxycodone; patient aware.   - States she gets gabapentin + Lyrica for peripheral neuropathy at home; not currently bothersome; monitor  1/20- will change bed to air mattress ot help with back pain.   1/22 - reverted back to regular mattress  d.t increase back pain  4. Mood/Behavior/Sleep:              -antipsychotic agents: Clonazepam 0.5 mg 3 times daily, melatonin 3 mg nightly  1/23: Encourage compliance with timed toiletting due to reported incontinence d/t fatigue, unwilling to get OOB - improving   5. Neuropsych/cognition: This patient is capable of making decisions on her own behalf.  -   neuropsych consulted -> saw patient 1/26, appreciate adjustment disorder with primary anxiety and chronic pain; no changes recommended   -  team providing ego support  -pt feels that she's doing a little better with anxiety  6. Skin/Wound Care Stage II L inner buttock pressure ulcer- from Purewick; tracheostomy opening - will do foam dressing and monitor clinically - Routine skin checks - tracheostomy site; healed, open to air  7. Fluids/Electrolytes/Nutrition: Routine in and outs with follow-up chemistries - Ca elevated but stable, monitor - Low protein/albumin, added Boost supplement TID for nutrition and wound healing  - Mild low K on admission labs; added K dur 20 meq 1 tab BID; still low increase KCL to  - 1/15: K 3.3; poor PO with intermittent refusals; switch to K powder 40 meq BID for 3 days and re-test  - 1/16: DC K phos d/t NPO; labs pending  - 1/17: Had 80 meq IV K overnight for 2.9; 3.3 this AM. Repeat IV 80 meq today with labs in AM. Phos and Mag WNL, no repletion needed. Will resume clear liquid diet today per GI and given maintenance fluids 75 cc/hr NS for 12 hours.   - 1/18: Had 20 meq IV K + 80 meq PO K yesterday, with improvement of K to 3.6 this AM. Continue on 40 meq BID regimen and recheck in AM to ensure no further drop.   - 1/19: K repleted; continue 40 meq BID PO and re-test Monday  1/22: K 3.7; appropriate, continue current repletion.   1/23: K 3.9. Na improved from 129->130 off of IV fluids. Encourage improved POs and repeat labs Friday.   1/26: K 3.8, Na improved to 132 on POs only. Overall stable. No  changes, BMP Monday.   1/29: K stable; continue current regimen   8.  Respiratory failure/tracheostomy.  Decannulated 1 week ago- no air leak.  Check oxygen saturations every shift- off O2- current sats 96-97% - Admission: Coarse cough with light green sputum- will check f/u CXR  - 1/11 CXR: Multifocal airspace consolidation throughout the left lung appears stable. R lung clear. Vitals stable on RA. Continue current management.    1/14- WBC up to 17- per #15  1/14: WBC downtrending; likely source UTI  1/17: Afebrile, vitals stable, WBC continues downtrend on UTI Tx. Mild sputum production, complex pathology on CT abdomen but appears stable from prior images. Low suspicion for recurrent pneumonia, monitor closely  1/23: Duonebs Q8h PRN for SOB added. CXR today for subjective SOB; given improved WBC stable at 10-12 low suspicion for recurrent PNA  1/24: IR to evalaute for potential effusion tap, given re-loculated on CXR -> no tap recommended at this time; monitor and continue duonebs for symptomatic SOB  1/26-27: duonebs, albuterol nebs, and flutter valve for SOB and productive cough  1/29: Improving tolerance overall; will need pulm f/u on DC for loculated effusion, given no intervention   9.  AKI.  Resolved. creatinine 0.4-0.7.  Follow-up chemistries  - Intermittent IVF for elevations with nausea/vomitting, stable now on PO diet  10.  History of tobacco polysubstance use.  Provide counseling  - Patient Hx snorting her prescribed oxycodone; understands no narcotic scripts from hospital at discharge  11.  Prediabetes.  Hemoglobin A1c 6.1.  Blood sugar checks discontinued  12.  Dysphagia.  Dysphagia #2 thin liquids.  Follow-up speech therapy - stable  1/16: NPO except sips with meds and ice chips d/t concern for colitis, awaiting GI eval  1/17: Clear liquid diet today, NPO at midnight for scope.   1/18: Cleared by GI to resume diet  - Dys 3 with thins 1/19  Diet Orders (From admission,  onward)     Start     Ordered   02/14/22 1206  DIET DYS 3 Room service appropriate? Yes; Fluid consistency: Thin  Diet effective now       Question Answer Comment  Room service appropriate? Yes   Fluid consistency: Thin      02/14/22 1205             13. Constipation- ongoing  1/14- had moderate stool burden- given SMOG enema last night- pt having bright red clots since then  1/18: BID miralax per GI; had multiple enemas for bowel prep  1/22: Amitiza per home regimen, suppository PRN pending KUB showed scattered stool, no acute findings. Resume miralax BID when tolerating POs.   1/23: Encouraging POS, changed Miralax to Colace 100 mg BID, also drinking prune juice. Did have small BM with suppository overnight.  1/25: added Senna 7.6 mg 1 tab QHS d/t poor miralax tolerance.  1/29: BM per patient 1/28; check UA d/t midline suprapubic pain, nausea, and dysuria  14. BRBPR w/ anemia, colitis - resolved  1/17: GI consulted, CT abdomen overnight with results showing ?rectosigmoid colitis. Unlikely infectious. Cleared for clear liquid diet today, patient to have colonoscopy vs. Flex sigmoidoscopy in AM; +/- EGD depending on iron panel.   1/18: See procedure note. Nonspecific colitis observed on flex sig. Biopsies pending. Per GI, cleared for PO diet with gentle miralax 17 g BID bowel regimen. Iron panel with low TIBC, high ferritin = likely anemia chronic disease, low suspicion for acute bleed.    15. Leukocytosis - Uti +/- colitis - resolved  1/14- WBC up to 17k from 11k- will check U/A and Cx but if that's negative, think the significant pleural effusion/multiloculated cosolidation on L lung could be getting infected- if need be, wil call ID to discuss- cannot see if this was treatment in [facility] after 1/1 when WBC went up to [facility]  1/15: See above; likely UTI started Keflex 500 mg q8 hours and since not voiding a lot, will start NS IVFs 75cc hour for 1 day -> switched to Rocephin 1 G daily  d/t poor PO tolerance, plan to switch back to PO once improved  1/16: Continue IV abx d/t NPO, N/V; awaiting sensitivities  1/17: WBC improving; continue IV Abx until post-scope and then advance to PO   1/18: Out to procedure today, switch to PO tomorrow AM with labs  1/19: Switched to ampicillin 500 mg TID PO for 3 days; WBC WNL - finished regimen  1/22: Mildly increase WBC today, will recheck in AM and if increasing further and/or fever will do full infectious workup with CXR, respiratory culture, repeat UA, Bcx2  1/29 labs stable, UA as above  17. Tachycardia - mild , asymptomatic - stable Vitals:   02/23/22 1927 02/24/22 0350  BP: 116/68 122/64  Pulse: 96 94  Resp: 18 18  Temp: 98.3 F (36.8 C) 97.7 F (36.5 C)  SpO2: 97% 98%      LOS: 18 days A FACE TO FACE EVALUATION WAS PERFORMED  Gertie Gowda 02/24/2022, 10:55 AM

## 2022-02-24 NOTE — Progress Notes (Signed)
Occupational Therapy Session Note  Patient Details  Name: Janet Hensley MRN: [id number] Date of Birth: 1970-06-10  Today's Date: 02/24/2022 OT Individual Time: 8144-8185 OT Individual Time Calculation (min): 58 min    Short Term Goals: Week 3:  OT Short Term Goal 1 (Week 3): LTG=STG 2/2 ELOS  Skilled Therapeutic Interventions/Progress Updates:    Pt greeted semi-reclined in bed and agreeable to OT treatment session focused on self-care retraining, activity tolerance, UB strengthening, and standing balance. Pt completed bed mobility with supervision. She then donned socks at EOB using figure 4 position. Pt needed min A to stand from EOB, then CGA to ambulate into bathroom w/ RW. Pt able to manage clothing with min A for balance and voided bladder. Peri-care w. Supervision, then min A again for balance when pulling up pants. Pt fatigued, but able to stay standing to wash hands. Pt needed to sit down to rest to brush teeth. Pt brought to therapy gym in wc. Addressed standing balance/endurance standing and completing 3 sets of 10 chest press, bicep curl, straight arm raise with min/CGA for balance without UE support. Pt propelled wc back to room for UB strengthening with increased time. Pt agreeable to sit up in recliner. Min A for functional ambulation from wc to recliner. Pt left seated in wc with alarm belt on, call bell in reach, and needs met.   Therapy Documentation Precautions:  Precautions Precautions: Fall Restrictions Weight Bearing Restrictions: No Pain: Paatient reported generalized pain. Rest and repositioned-improved   Therapy/Group: Individual Therapy  Valma Cava 02/24/2022, 7:56 AM

## 2022-02-24 NOTE — Progress Notes (Signed)
Physical Therapy Weekly Progress Note  Patient Details  Name: Janet Hensley MRN: [id number] Date of Birth: 21-Oct-1970  Beginning of progress report period: February 07, 2022 End of progress report period: February 24, 2022  Today's Date: 02/24/2022 PT Individual Time: 1st Treatment Session: 1015-1130; 2nd Treatment Session: 1415-1530  PT Individual Time Calculation (min): 75 min; 75 min  Patient has met 3 of 3 short term goals. No new STGs were created secondary to patient's ELOS. Family training was completed last week with needs for improvement with sit/stands and stair mobility in order to improve overall independence and decrease caregiver burden.   Patient continues to demonstrate the following deficits muscle weakness, decreased cardiorespiratoy endurance, and decreased standing balance and decreased balance strategies and therefore will continue to benefit from skilled PT intervention to increase functional independence with mobility.  Patient not progressing toward long term goals.  See goal revision..  Plan of care revisions: Goals downgraded to CGA/MinA for functional mobility in order to better reflect her CLOF.  PT Short Term Goals Week 1:  PT Short Term Goal 1 (Week 1): Pt will complete bed mobility with minA consistently. PT Short Term Goal 1 - Progress (Week 1): Progressing toward goal PT Short Term Goal 2 (Week 1): Pt will complete bed to chair transfer with minA consistently PT Short Term Goal 2 - Progress (Week 1): Progressing toward goal PT Short Term Goal 3 (Week 1): Pt will ambulate x15' with modA and LRAD. PT Short Term Goal 3 - Progress (Week 1): Progressing toward goal Week 2:  PT Short Term Goal 1 (Week 2): Pt will perform bed mobility with minA consistently. PT Short Term Goal 1 - Progress (Week 2): Met PT Short Term Goal 2 (Week 2): Pt will perform bed to chair transfer with minA consistently. PT Short Term Goal 2 - Progress (Week 2): Met PT Short Term Goal 3  (Week 2): Pt will ambulate x15' with minA and LRAD. PT Short Term Goal 3 - Progress (Week 2): Met Week 3:  PT Short Term Goal 1 (Week 3): STG=LTG secondary to ELOS  Skilled Therapeutic Interventions/Progress Updates:  1st Treatment Session- Patient greeted sitting upright in bedside recliner and agreeable to PT treatment session. Patient performed sit/stand from recliner with RW and ModA for initiating standing- Patient then pivoted to the wheelchair with CGA and appropriate eccentric control. Patient wheeled to rehab gym for time management and energy conservation.   Patient performed alternating step-ups to 3" step with B HR and CGA- Patient performed x20 total.   Patient ascended/descended x8- 3" steps with B HR and CGA for safety- Patient ascended with L LE and descended with R LE. Patient required an extended seated rest break secondary to reports of fatigue.   Patient performed x4 step-ups to 6" step with B HR and Min/ModA for lifting- Patient stepped up with L LE each trial and stepped back with R LE.   Patient performed 2 x 10 mini-stands from wheelchair in order to improve sit/stand mechanics and overall strength- VC/TC and demonstration for increased anterior weight shift, B knee anterior translation and increased weight-bearing through her heels instead of toes. With increased repetition and hands-on assistance patient was able to demonstrate improved mechanics and effort. Extended seated rest break required in between sets secondary to notable SOB and fatigue.   Patient returned to her room requesting to use the restroom- Patient performed sit/stand from wheelchair with RW and MinA (improved carryover noted from above activity). Patient ambulated ~20'  to/from the bathroom with RW and CGA for safety- While standing, therapist provided MinA for managing pants/brief. Patient with continent void and was independent with pericare. Patient returned to sitting EOB with call bell within reach and  all needs met- RN present.     2nd Treatment Session- Patient greeted supine asleep in bed, but easily arousable and agreeable to PT treatment session. Patient reporting nausea and vomiting this morning after therapy, however denies nausea currently- Patient unable to have medication, however was agreeable to saltine crackers with peanut butter since patient did not eat anything for lunch. Patient transitioned from supine to sitting EOB with use of bed rail and supv. While sitting EOB, patient donned tennis shoes with set-up assist. Patient stood from EOB with RW and MinA- Patient ambulated to/from the bathroom (~20') with RW and CGA for safety. Patient with continent void while sitting on the toilet and performed pericare independently. Patient stood from toilet with MinA and therapist managed pants. Patient sat upright in wheelchair while eating her crackers and peanut butter prior to heading to the gym. Patient wheeled to rehab gym for time management and energy conservation.   Patient stood from wheelchair with RW and Min/ModA- Patient then pivoted toward the mat table where she sat with improved eccentric control. Patient transitioned to/from sitting edge of mat table and supine with supv.   Supine therex performed in order to increased B LE/UE strength and ROM for improved functional mobility- Bridges, 3 x 10 B SAQ with 4#, 3 x 10  B chest press with 3# bar, 3 x 10  Patient transitioned from supine to sitting EOM with SBA- Patient then stood with RW and MinA and ambulated ~15' to Nustep with CGA. Patient completed Nustep x10' at level 5 to improve B LE/UE strength and overall endurance/activity tolerance.   Patient ambulated ~50' from Nustep in the direction of her room with RW and CGA- Patient encouraged to walk further, however reported she needed to sit and placed her bottom in the wheelchair behind her without giving therapist enough time to lock the brakes.   Patient returned to her room  where she stood from wheelchair with RW and MinA and ambulated to EOB with CGA. Patient transitioned to supine with supv. Patient left supine in bed with call bell within reach, bed alarm on and all needs met. Educated patient regarding calling her husband to discuss ramp installation vs installing second HR for improved ease and safety into home at discharge.    Therapy Documentation Precautions:  Precautions Precautions: Fall Restrictions Weight Bearing Restrictions: No  Therapy/Group: Individual Therapy  Marlet Korte 02/24/2022, 7:56 AM

## 2022-02-24 NOTE — Progress Notes (Signed)
Pt refused Potassium stating it makes her nauseous, will speak to PA.

## 2022-02-24 NOTE — Plan of Care (Signed)
Goals downgraded to better reflect patient's CLOF and the fact that she will have hands-on assistance at home from family.   Problem: Sit to Stand Goal: LTG:  Patient will perform sit to stand with assistance level (PT) Description: LTG:  Patient will perform sit to stand with assistance level (PT) Flowsheets (Taken 02/24/2022 1153) LTG: PT will perform sit to stand in preparation for functional mobility with assistance level: (Downgraded to reflect CLOF- S.R. 1/29) Minimal Assistance - Patient > 75% Note: Downgraded to reflect CLOF- S.R. 1/29    Problem: RH Balance Goal: LTG Patient will maintain dynamic sitting balance (PT) Description: LTG:  Patient will maintain dynamic sitting balance with assistance during mobility activities (PT) Flowsheets (Taken 02/24/2022 1153) LTG: Pt will maintain dynamic sitting balance during mobility activities with:: (Downgraded to reflect CLOF- S.R. 1/29) Contact Guard/Touching assist Note: Downgraded to reflect CLOF- S.R. 1/29  Goal: LTG Patient will maintain dynamic standing balance (PT) Description: LTG:  Patient will maintain dynamic standing balance with assistance during mobility activities (PT) Flowsheets (Taken 02/24/2022 1153) LTG: Pt will maintain dynamic standing balance during mobility activities with:: (Downgraded to reflect CLOF- S.R. 1/29) Contact Guard/Touching assist Note: Downgraded to reflect CLOF- S.R. 1/29    Problem: Sit to Stand Goal: LTG:  Patient will perform sit to stand with assistance level (PT) Description: LTG:  Patient will perform sit to stand with assistance level (PT) Flowsheets (Taken 02/24/2022 1153) LTG: PT will perform sit to stand in preparation for functional mobility with assistance level: (Downgraded to reflect CLOF- S.R. 1/29) Minimal Assistance - Patient > 75% Note: Downgraded to reflect CLOF- S.R. 1/29    Problem: RH Bed to Chair Transfers Goal: LTG Patient will perform bed/chair transfers w/assist  (PT) Description: LTG: Patient will perform bed to chair transfers with assistance (PT). Flowsheets (Taken 02/24/2022 1153) LTG: Pt will perform Bed to Chair Transfers with assistance level: (Downgraded to reflect CLOF- S.R. 1/29) Contact Guard/Touching assist Note: Downgraded to reflect CLOF- S.R. 1/29    Problem: RH Ambulation Goal: LTG Patient will ambulate in controlled environment (PT) Description: LTG: Patient will ambulate in a controlled environment, # of feet with assistance (PT). Flowsheets (Taken 02/24/2022 1153) LTG: Pt will ambulate in controlled environ  assist needed:: (Downgraded to reflect CLOF- S.R. 1/29) Contact Guard/Touching assist Note: Downgraded to reflect CLOF- S.R. 1/29  Goal: LTG Patient will ambulate in home environment (PT) Description: LTG: Patient will ambulate in home environment, # of feet with assistance (PT). Flowsheets (Taken 02/24/2022 1153) LTG: Pt will ambulate in home environ  assist needed:: (Downgraded to reflect CLOF- S.R. 1/29) Contact Guard/Touching assist LTG: Ambulation distance in home environment: Downgraded to reflect CLOF- S.R. 1/29   Problem: RH Stairs Goal: LTG Patient will ambulate up and down stairs w/assist (PT) Description: LTG: Patient will ambulate up and down # of stairs with assistance (PT) Flowsheets (Taken 02/24/2022 1153) LTG: Pt will ambulate up/down stairs assist needed:: (Downgraded to reflect CLOF- S.R. 1/29) Minimal Assistance - Patient > 75% Note: Downgraded to reflect CLOF- S.R. 1/29    Problem: RH Car Transfers Goal: LTG Patient will perform car transfers with assist (PT) Description: LTG: Patient will perform car transfers with assistance (PT). Flowsheets (Taken 02/24/2022 1153) LTG: Pt will perform car transfers with assist:: (Downgraded to reflect CLOF- S.R. 1/29) Contact Guard/Touching assist Note: Downgraded to reflect CLOF- S.R. 1/29

## 2022-02-25 ENCOUNTER — Inpatient Hospital Stay (HOSPITAL_COMMUNITY): Payer: 59

## 2022-02-25 DIAGNOSIS — G7281 Critical illness myopathy: Secondary | ICD-10-CM | POA: Diagnosis not present

## 2022-02-25 MED ORDER — POTASSIUM CHLORIDE 20 MEQ PO PACK
40.0000 meq | PACK | Freq: Two times a day (BID) | ORAL | Status: DC
  Filled 2022-02-25: qty 2

## 2022-02-25 NOTE — Progress Notes (Signed)
PROGRESS NOTE   Subjective/Complaints:  Reported continent of bowel and bladder this week. Noted decreased POs to 25% over tlast day, along with increasing use of Zofran. She endorses daily Bms, still some abdominal pain, states hospital food has been difficult for hear to tolerate. Does like chocolate/vanilla boosts, states she eats these at home. No other complaints.   ROS: Patient denies fever, rash, sore throat, blurred vision, dizziness,   diarrhea, cough, shortness of breath or chest pain, joint or back/neck pain, headache, or mood change.    Objective:   No results found. Recent Labs    02/24/22 0550  WBC 10.0  HGB 9.4*  HCT 29.5*  PLT 387    Recent Labs    02/24/22 0550  NA 135  K 3.8  CL 102  CO2 21*  GLUCOSE 84  BUN 8  CREATININE 0.71  CALCIUM 10.8*     Intake/Output Summary (Last 24 hours) at 02/25/2022 1049 Last data filed at 02/24/2022 1826 Gross per 24 hour  Intake 320 ml  Output --  Net 320 ml         Physical Exam: Vital Signs Blood pressure (!) 97/58, pulse 91, temperature 98.1 F (36.7 C), temperature source Oral, resp. rate 17, height 5\' 3"  (1.6 m), weight 70.4 kg, last menstrual period 01/16/2016, SpO2 93 %.   Constitutional: No distress . Vital signs reviewed.  HEENT: NCAT, EOMI, oral membranes moist Neck: supple Cardiovascular: mild tachycardiawithout murmur. No JVD    Respiratory/Chest: Mild wheezes w/ exertion, otherwise CTAB. +breathy vocal quality. +Reduced breath sounds in LLL - unchanged GI/Abdomen: BS +, non-tender, non-distended Ext: no clubbing, cyanosis, or edema Psych: pleasant and cooperative  Skin: No evidence of breakdown, no evidence of rash. +tracheostomy site closed, open to air.  + buttocks skin tear with aquagel  Neurological: AAOx3. Appropriate.  No apparent deficits. Follows all commands. Musculoskeletal: Moving all 4 limbs 5/5; completes 10 alternating  steps with walker for support and significant fatigue.     Assessment/Plan: 1. Functional deficits which require 3+ hours per day of interdisciplinary therapy in a comprehensive inpatient rehab setting. Physiatrist is providing close team supervision and 24 hour management of active medical problems listed below. Physiatrist and rehab team continue to assess barriers to discharge/monitor patient progress toward functional and medical goals  Care Tool:  Bathing    Body parts bathed by patient: Chest, Abdomen, Right upper leg, Left upper leg, Face   Body parts bathed by helper: Left lower leg, Right lower leg, Front perineal area, Buttocks, Right arm, Left arm     Bathing assist Assist Level: Maximal Assistance - Patient 24 - 49%     Upper Body Dressing/Undressing Upper body dressing   What is the patient wearing?: Pull over shirt    Upper body assist Assist Level: Moderate Assistance - Patient 50 - 74%    Lower Body Dressing/Undressing Lower body dressing      What is the patient wearing?: Pants     Lower body assist Assist for lower body dressing: Maximal Assistance - Patient 25 - 49%     Toileting Toileting    Toileting assist Assist for toileting: Total Assistance - Patient <  25%     Transfers Chair/bed transfer  Transfers assist     Chair/bed transfer assist level: Contact Guard/Touching assist     Locomotion Ambulation   Ambulation assist      Assist level: Contact Guard/Touching assist Assistive device: Walker-rolling Max distance: 180'   Walk 10 feet activity   Assist  Walk 10 feet activity did not occur: Safety/medical concerns  Assist level: Contact Guard/Touching assist Assistive device: Walker-rolling   Walk 50 feet activity   Assist Walk 50 feet with 2 turns activity did not occur: Safety/medical concerns  Assist level: Contact Guard/Touching assist Assistive device: Walker-rolling    Walk 150 feet activity   Assist Walk  150 feet activity did not occur: Safety/medical concerns  Assist level: Contact Guard/Touching assist Assistive device: Walker-rolling    Walk 10 feet on uneven surface  activity   Assist Walk 10 feet on uneven surfaces activity did not occur: Safety/medical concerns         Wheelchair     Assist Is the patient using a wheelchair?: Yes Type of Wheelchair: Manual    Wheelchair assist level: Supervision/Verbal cueing Max wheelchair distance: 150'    Wheelchair 50 feet with 2 turns activity    Assist        Assist Level: Supervision/Verbal cueing   Wheelchair 150 feet activity     Assist      Assist Level: Supervision/Verbal cueing   Blood pressure (!) 97/58, pulse 91, temperature 98.1 F (36.7 C), temperature source Oral, resp. rate 17, height 5\' 3"  (1.6 m), weight 70.4 kg, last menstrual period 01/16/2016, SpO2 93 %.  Medical Problem List and Plan: 1. 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  Antithrombotics: -DVT/anticoagulation:  Pharmaceutical: Heparin- will try and change to Lovenox- as long as labs in AM are OK -> HgB stable, Cr WNL, switch to Lovenox inj 40 mg daily              -antiplatelet therapy: N/A 3. Pain Management: Lidoderm patch, Tylenol as needed - stable  - Reports chronic neck pain with Percocet 10 mg at home; was getting Oxy 5 mg Q6H PRN per admission notes; Oxy 2.5 mg Q6H PRN, will wean as tolerated. Cannot prescribe pain medication on discharge d/t documented history of snorting oxycodone; patient aware.   - States she gets gabapentin + Lyrica for peripheral neuropathy at home; not currently bothersome; monitor  1/20- will change bed to air mattress ot help with back pain.   1/22 - reverted back to regular mattress d.t increase  back pain  4. Mood/Behavior/Sleep:              -antipsychotic agents: Clonazepam 0.5 mg 3 times daily, melatonin 3 mg nightly  1/23: Encourage compliance with timed toiletting due to reported incontinence d/t fatigue, unwilling to get OOB - improving   5. Neuropsych/cognition: This patient is capable of making decisions on her own behalf.  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 Skin/Wound  Care Stage II L inner buttock pressure ulcer- from Purewick; tracheostomy opening - will do foam dressing and monitor clinically - Routine skin checks - tracheostomy site; healed, open to air  7. Fluids/Electrolytes/Nutrition: Routine in and outs with follow-up chemistries - stable - Ca elevated but stable, monitor - Low protein/albumin, added Boost supplement TID for nutrition and wound healing  - Intermittent low K-> normalized at K dur 40 meq BID to 3.8; refused IV repletion d/t burning, intermittent PO refusal d/t nausea - Na improved from 129->135 with DC IV fluids. Encourage improved POs  8.  Respiratory failure/tracheostomy.  Decannulated 1 week PTA- no air leak.  Check oxygen saturations every shift- off O2- current sats 96-97% - Admission: Coarse cough with light green sputum- will check f/u CXR  - 1/11 CXR: Multifocal airspace consolidation throughout the left lung appears stable. R lung clear. Vitals stable on RA. Continue current management.    1/14- WBC up to 17- per #15  1/14: WBC downtrending; likely source UTI  1/17: Afebrile, vitals stable, WBC continues downtrend on UTI Tx. Mild sputum production, complex pathology on CT abdomen but appears stable from prior images. Low suspicion for recurrent pneumonia, monitor closely  1/23: Duonebs Q8h PRN for SOB added. CXR today for subjective SOB; given  improved WBC stable at 10-12 low suspicion for recurrent PNA  1/24: IR to evalaute for potential effusion tap, given re-loculated on CXR -> no tap recommended at this time; monitor and continue duonebs for symptomatic SOB  1/26-27: duonebs, albuterol nebs, and flutter valve for SOB and productive cough  1/29: Improving tolerance overall; will need pulm f/u on DC for loculated effusion, given no intervention   9.  AKI.  Resolved. creatinine 0.4-0.7.  Follow-up chemistries  - Intermittent IVF for elevations with nausea/vomitting, stable now on PO diet  10.  History of tobacco polysubstance use.  Provide counseling  - Patient Hx snorting her prescribed oxycodone; understands no narcotic scripts from hospital at discharge  11.  Prediabetes.  Hemoglobin A1c 6.1.  Blood sugar checks discontinued  12.  Dysphagia.  Dysphagia #2 thin liquids.  Follow-up speech therapy - stable  1/16: NPO except sips with meds and ice chips d/t concern for colitis, awaiting GI eval  1/17: Clear liquid diet today, NPO at midnight for scope.   1/18: Cleared by GI to resume diet  - Dys 3 with thins 1/19 Diet Orders (From admission, onward)     Start     Ordered   02/25/22 0854  Diet regular Room service appropriate? Yes; Fluid consistency: Thin  Diet effective now       Question Answer Comment  Room service appropriate? Yes   Fluid consistency: Thin      02/25/22 0853             13. Constipation- ongoing  1/14- had moderate stool burden- given SMOG enema last night- pt having bright red clots since then  1/18: BID miralax per GI; had multiple enemas for bowel prep  1/22: Amitiza per home regimen, suppository PRN pending KUB showed scattered stool, no acute findings. Resume miralax BID when tolerating POs.   1/23: Encouraging POS, changed Miralax to Colace 100 mg BID, also drinking prune juice. Did have small BM with suppository overnight.  1/25: added Senna 7.6 mg 1 tab QHS d/t poor miralax tolerance.   1/29: BM per patient 1/28; check UA d/t midline suprapubic pain, nausea, and dysuria - negative 1/30: KUB today for worsening nausea. Poor POs, LBM  per nursing 1/26. Added Boost per patient for mealtime POs.   14. BRBPR w/ anemia, colitis - resolved  1/17: GI consulted, CT abdomen overnight with results showing ?rectosigmoid colitis. Unlikely infectious. Cleared for clear liquid diet today, patient to have colonoscopy vs. Flex sigmoidoscopy in AM; +/- EGD depending on iron panel.   1/18: See procedure note. Nonspecific colitis observed on flex sig. Biopsies pending. Per GI, cleared for PO diet with gentle miralax 17 g BID bowel regimen. Iron panel with low TIBC, high ferritin = likely anemia chronic disease, low suspicion for acute bleed.    15. Leukocytosis - Uti +/- colitis - resolved  1/14- WBC up to 17k from 11k- UTI started Keflex 500 mg q8 hours and since not voiding a lot, will start NS IVFs 75cc hour for 1 day -> switched to Rocephin 1 G daily d/t poor PO tolerance, plan to switch back to PO once improved  1/19: Switched to ampicillin 500 mg TID PO for 3 days; WBC WNL - finished regimen  1/29 labs stable, UA as above stable  17. Tachycardia - mild , asymptomatic - stable Vitals:   02/24/22 2005 02/25/22 0431  BP: 105/84 (!) 97/58  Pulse: (!) 101 91  Resp: 18 17  Temp: 98.1 F (36.7 C)   SpO2: 100% 93%      LOS: 19 days A FACE TO FACE EVALUATION WAS PERFORMED  Gertie Gowda 02/25/2022, 10:49 AM

## 2022-02-25 NOTE — Progress Notes (Signed)
Occupational Therapy Session Note  Patient Details  Name: Janet Hensley MRN: [id number] Date of Birth: 1970-12-07  Today's Date: 02/25/2022 OT Individual Time: 8502-7741 OT Individual Time Calculation (min): 72 min    Short Term Goals: Week 3:  OT Short Term Goal 1 (Week 3): LTG=STG 2/2 ELOS  Skilled Therapeutic Interventions/Progress Updates:    Pt greeted semi-reclined in bed, easy to wake and agreeable to OT treatment session. Pt reported being very sore today from therapy yesterday. Pt  complete dbed mobility w/ supervision. Sit<>stand w/ RW and min A, then CGA to ambulate into bathroom. CGA for balance when managing clothing. Pt voided bladder and completed peri-care with supervision. Pt ambulated out of bathroom in similar fashion. Worked on standing balance/endurnace with standing grooming tasks. Pt tolerated standing for 3 grooming tasks after seated rest break. Pt able to get UE's up to her head to assist with removing braids. Pt got dressed today using figure 4 position including socks and shoes. Worked on balance standing and alternating UE's to pull up pants. OT set up breakfast, but he was nauseous with smell of food. Pt only wanted to eat banana. OT issued red theraband and home exercise program but did not have time to go over it today. Handoff to SLP for next therapy session.  Therapy Documentation Precautions:  Precautions Precautions: Fall Restrictions Weight Bearing Restrictions: No Pain:  Denies pain this morning   Therapy/Group: Individual Therapy  Valma Cava 02/25/2022, 3:29 PM

## 2022-02-25 NOTE — Plan of Care (Signed)
  Problem: RH Balance Goal: LTG Patient will maintain dynamic standing with ADLs (OT) Description: LTG:  Patient will maintain dynamic standing balance with assist during activities of daily living (OT)  Flowsheets (Taken 02/25/2022 1053) LTG: Pt will maintain dynamic standing balance during ADLs with: Contact Guard/Touching assist Note: goal downgraded 1/30-ESD    Problem: Sit to Stand Goal: LTG:  Patient will perform sit to stand in prep for activites of daily living with assistance level (OT) Description: LTG:  Patient will perform sit to stand in prep for activites of daily living with assistance level (OT) Flowsheets (Taken 02/25/2022 1053) LTG: PT will perform sit to stand in prep for activites of daily living with assistance level: (goal downgraded 1/30-ESD) Minimal Assistance - Patient > 75% Note: goal downgraded 1/30-ESD   Problem: RH Eating Goal: LTG Patient will perform eating w/assist, cues/equip (OT) Description: LTG: Patient will perform eating with assist, with/without cues using equipment (OT) Flowsheets (Taken 02/25/2022 1053) LTG: Pt will perform eating with assistance level of: (goal downgraded 1/30-ESD) Set up assist  Note: goal downgraded 1/30-ESD    Problem: RH Dressing Goal: LTG Patient will perform lower body dressing w/assist (OT) Description: LTG: Patient will perform lower body dressing with assist, with/without cues in positioning using equipment (OT) Flowsheets (Taken 02/25/2022 1053) LTG: Pt will perform lower body dressing with assistance level of: Contact Guard/Touching assist Note: goal downgraded 1/30-ESD    Problem: RH Toileting Goal: LTG Patient will perform toileting task (3/3 steps) with assistance level (OT) Description: LTG: Patient will perform toileting task (3/3 steps) with assistance level (OT)  Flowsheets (Taken 02/25/2022 1053) LTG: Pt will perform toileting task (3/3 steps) with assistance level: Contact Guard/Touching assist Note: goal  downgraded 1/30-ESD    Problem: RH Toilet Transfers Goal: LTG Patient will perform toilet transfers w/assist (OT) Description: LTG: Patient will perform toilet transfers with assist, with/without cues using equipment (OT) Flowsheets (Taken 02/25/2022 1053) LTG: Pt will perform toilet transfers with assistance level of: Contact Guard/Touching assist Note: goal downgraded 1/30-ESD    Problem: RH Tub/Shower Transfers Goal: LTG Patient will perform tub/shower transfers w/assist (OT) Description: LTG: Patient will perform tub/shower transfers with assist, with/without cues using equipment (OT) Flowsheets (Taken 02/25/2022 1053) LTG: Pt will perform tub/shower stall transfers with assistance level of: Contact Guard/Touching assist Note: goal downgraded 1/30-ESD

## 2022-02-25 NOTE — Progress Notes (Signed)
Physical Therapy Session Note  Patient Details  Name: Janet Hensley MRN: [id number] Date of Birth: 1970-12-30  Today's Date: 02/25/2022 PT Individual Time: 1st Treatment Session: [phone]; 2nd Treatment Session: [id number] PT Individual Time Calculation (min): 45 min; 45 min  Short Term Goals: Week 3:  PT Short Term Goal 1 (Week 3): STG=LTG secondary to ELOS  Skilled Therapeutic Interventions/Progress Updates:  1st Treatment Session- Patient greeted sitting upright in wheelchair in room and agreeable to PT treatment session- Patient reports increased "soreness" from sessions yesterday and educated regarding why she's sore, etc. Patient wheeled to rehab gym for time management and energy conservation.   Patient stood from wheelchair with RW and MinA- Patient continues to rock forward three times in order to gain momentum for standing with god effort noted.   Patient gait trained x67' and x128' with RW and CGA for safety- Patient demonstrated appropriate cadence and UE use throughout gait trial with no buckling noted. Patient required an extended seated rest break in between gait trials secondary to fatigue.   Patient stood with RW and MinA and then performed alternating toe taps to 6" step with CGA for safety- Patient performed 2 x 10 on each LE. Decreased foot clearance noted with R LE compared to L LE secondary to weakness. Patient required an extended seated rest break in between sets secondary to fatigue.   Patient returned to her room and stood from wheelchair with RW and MinA- Patient ambulated toward EOB with CGA. Patient transitioned from sitting EOB to supine with supv. Patient left supine in bed with call bell within reach, bed alarm on and all needs met.    2nd Treatment Session- Patient greeted supine in bed and agreeable to PT treatment session. Patient transitioned from supine to sitting EOB with use of bed rail and supv- Increased time required to complete due to flat bed and  UE weakness. While sitting EOB, patient donned tennis shoes and therapist tied shoelaces for time management. Patient stood from bed with RW and MinA and then ambulated to the bathroom with personal RW and CGA. Patient was able to manage pants/brief with CGA for safety. Patient with continent BM and urine while sitting on the toilet and able to independently perform pericare. Patient stood from commode with RW and grab bar and CGA/MinA. While standing, patient was able to manage pants and brief with CGA for safety. Patient ambulated to the wheelchair with use of RW and CGA. Patient wheeled to rehab gym for time management and energy conservation.   Patient ascended/descended x4 steps, x2 trials with Min/ModA for ascending the steps and CGA for descending with no buckling noted. Patient required extended seated rest break in between trials secondary to notable fatigue. Educated patient regarding ascending/descending via shower chair method, however patient was not agreeable and reported increased fear.   Patient returned to her room where she stood from wheelchair with RW and MinA and then ambulated ~8' to EOB with CGA. Patient left sitting upright at EOB with call bell within reach, tray table with lunch in front and all needs met.     Therapy Documentation Precautions:  Precautions Precautions: Fall Restrictions Weight Bearing Restrictions: No   Therapy/Group: Individual Therapy  Tameeka Luo 02/25/2022, 7:49 AM

## 2022-02-25 NOTE — Progress Notes (Signed)
Patient ID: Janet Hensley, female   DOB: [birth year], 52 y.o.   MRN: [id number]  SW received updates from PT that pt did not have a cushion delivered with DME.   SW updated [place] on cushion missing. Will have tech deliver cushion to room.   SW met with pt in room to discuss team conference/review discharge. SW discussed with pt number of times she has applied for SSDI. Reports 3xs. SW shared [facility] can not assist if applied more than 2xs. Encouraged her to obtain lawyer as her husband mentioned. She is aware of outpatient referrals faxed.   69- SW spoke with pt husband Janet Hensley to discuss above. No questions/concerns reported.   Loralee Pacas, MSW, [place] Office: [phone] Cell: [phone] Fax: [phone]

## 2022-02-25 NOTE — Patient Care Conference (Signed)
Inpatient RehabilitationTeam Conference and Plan of Care Update Date: 02/25/2022   Time: 10:47 AM    Patient Name: Janet Hensley      Medical Record Number: [id number]  Date of Birth: Oct 15, 1970 Sex: Female         Room/Bed: 4W03C/4W03C-01 Payor Info: Payor: Holland Falling / Plan: [employer] [facility]  / Product Type: *No Product type* /    Admit Date/Time:  02/06/2022  5:50 PM  Primary Diagnosis:  Critical illness myopathy  Hospital Problems: Principal Problem:   Critical illness myopathy Active Problems:   Lower abdominal pain   Rectal bleeding   Anemia   Nausea and vomiting   Abnormal CT scan, gastrointestinal tract   Adjustment disorder with anxious mood    Expected Discharge Date: Expected Discharge Date: 02/28/22  Team Members Present: Physician leading conference: Dr. Durel Salts Social Worker Present: Loralee Pacas, Raymond Nurse Present: Tacy Learn, RN PT Present: Terence Lux, PT OT Present: Cherylynn Ridges, OT SLP Present: Weston Anna, SLP PPS Coordinator present : Gunnar Fusi, SLP     Current Status/Progress Goal Weekly Team Focus  Bowel/Bladder   Incontinent of b/b   Regain continence   Assist w/toileting needs q 2-4 hours and prn    Swallow/Nutrition/ Hydration   Dys, 3 textures with thin liquids, Mod I   Mod I  Trials of regular textures    ADL's   Min/CGA Overall,   Goals downgarded to CGA/supervision, min A stand   self-care retrianing, activity tolerance, UB strengthening,    Mobility   Supv for bed mobility; MinA for sit/stand; CGA for transfers and gait up to 180' with RW; Min/ModA for stair mobility up to 8- 3" steps with B HR (discussed installing second HR or ramp for safe entry into home)- Limited by B LE weakness and fear of falling.   Goals downgraded to CGA/MinA  Global strengthening, dynamic stability, endurnace/activity tolerance, muscle endurance, transfes, gait, STAIR MOBILITY, sit/stands and transfers    Communication                 Safety/Cognition/ Behavioral Observations  Supervision-Min A   Supervision   Complex problem solving, functional recall    Pain   pt c/o back pain, 7/10 pain score. 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 DME ordered- RW, 3in1 BSC, w/c, shower chair with back, and nebulizer with meds (duoneb) with [employer] [facility]. Outpatient PT/SLP/OT referral sent to [facility] location. Fam edu completed.   Team Discussion: Critical illness myopathy. Continent B/B with time toileting. Pain managed with PRN medications. Stoma site healed. Patient agreed to drink Boost. U/A good. Encourage fluids. MinA to get to standing. CGA gait. Hands on at all times due to LOB. Some PT goals down graded. Diet upgraded and cognition improved.  Patient on target to meet rehab goals: yes, patient on track to meet goals  *See Care Plan and progress notes for long and short-term goals.   Revisions to Treatment Plan:  Medication/supplement adjustments, monitor labs Teaching Needs: Medications, safety, self care, gait/transfer training, skin care, etc.   Current Barriers to Discharge: Decreased caregiver  support and Medication compliance  Possible Resolutions to Barriers: Family education complete, nursing education, and equipment in room for discharge.      Medical Summary Current Status: meically complicaqted by loculated pleural effusion, chronic respiratory disease, nausea/vomitting, poor PO intakes, constipation, intermittent urinary incontinence, debility, anxiety/adjustment disorder, hyponatremia/hypokalemia, pain managment  Barriers to Discharge: Electrolyte  abnormality;Hypotension;Inadequate Nutritional Intake;Incontinence;Medical stability;Self-care education;Uncontrolled Pain  Barriers to Discharge Comments: poor nutritional intake due to intermittent nausea, chronic constipation, pain control, debility Possible Resolutions to [employer] Focus: therapies to improve endurance, medications to treat nausea and constipation, encourage PO intakes   Continued Need for Acute Rehabilitation Level of Care: The patient requires daily medical management by a physician with specialized training in physical medicine and rehabilitation for the following reasons: Direction of a multidisciplinary physical rehabilitation program to maximize functional independence : Yes Medical management of patient stability for increased activity during participation in an intensive rehabilitation regime.: Yes Analysis of laboratory values and/or radiology reports with any subsequent need for medication adjustment and/or medical intervention. : Yes   I attest that I was present, lead the team conference, and concur with the assessment and plan of the team.   Ernest Pine 02/25/2022, 2:15 PM

## 2022-02-25 NOTE — Progress Notes (Signed)
Speech Language Pathology Daily Session Note  Patient Details  Name: Janet Hensley MRN: [id number] Date of Birth: Sep 28, 1970  Today's Date: 02/25/2022 SLP Individual Time: 4854-6270 SLP Individual Time Calculation (min): 40 min  Short Term Goals: Week 3: SLP Short Term Goal 1 (Week 3): STGs=LTGs due to ELOS  Skilled Therapeutic Interventions: Skilled treatment session focused on dysphagia and cognitive goals. Upon arrival, patient was eating her banana (declined her breakfast) due to not feeling well. SLP added peanut butter to her banana for ongoing trials of regular textures. Patient demonstrated efficient mastication and complete oral clearance without overt s/s of aspiration. Patient also reports eating regular textures from outside restaurants that family has been bringing her and without difficulty with swallowing function. Therefore, recommend patient upgrade to regular textures. SLP also facilitated session by re-administering the Cognistat. Patient scored WFL on all  subtests which is an improvement since initial assessment. Patient left upright in wheelchair with alarm on and all needs within reach. Continue with current plan of care.      Pain Pain Assessment Pain Scale: 0-10 Pain Score: 3   Therapy/Group: Individual Therapy  Cloteal Isaacson 02/25/2022, 10:41 AM

## 2022-02-26 DIAGNOSIS — G7281 Critical illness myopathy: Secondary | ICD-10-CM | POA: Diagnosis not present

## 2022-02-26 MED ORDER — POTASSIUM CHLORIDE CRYS ER 20 MEQ PO TBCR
40.0000 meq | EXTENDED_RELEASE_TABLET | Freq: Two times a day (BID) | ORAL | Status: DC
  Administered 2022-02-26 – 2022-02-28 (×5): 40 meq via ORAL
  Filled 2022-02-26 (×4): qty 2

## 2022-02-26 NOTE — Progress Notes (Signed)
Speech Language Pathology Discharge Summary  Patient Details  Name: Janet Hensley MRN: [id number] Date of Birth: Sep 30, 1970  Date of Discharge from SLP service:February 27, 2022  {chl ip rehab slp time calculations:[id number]}   Skilled Therapeutic Interventions:  ***     Patient has met 6 of 6 long term goals.  Patient to discharge at overall Supervision;Modified Independent level.   Reasons goals not met: N/A   Clinical Impression/Discharge Summary: Patient has made excellent gains and has met 6 of 6 LTGs this admission. Currently, patient is consuming regular textures with thin liquids with minimal overt s/s of aspiration and overall Mod I for use of swallowing compensatory strategies. Patient continues to demonstrate a hoarse vocal quality, suspect due to prolonged intubation. Patient is 100% intelligible at the conversation level but may benefit from an ENT appointment if hoarseness persists. Patient's overall cognitive functioning has improved and patient can complete functional and mildly complex tasks safely with overall supervision-Mod I for recall of new information and problem solving. Patient and family education is complete and patient will discharge home with 24 hour supervision from family. Patient would benefit from f/u SLP services to maximize her cognitive functioning and overall functional independence in order to reduce caregiver burden.   Care Partner:  Caregiver Able to Provide Assistance: Yes  Type of Caregiver Assistance: Physical;Cognitive  Recommendation:  24 hour supervision/assistance;Outpatient SLP  Rationale for SLP Follow Up: Reduce caregiver burden;Maximize cognitive function and independence   Equipment: N/A   Reasons for discharge: Discharged from hospital;Treatment goals met   Patient/Family Agrees with Progress Made and Goals Achieved: Yes    Jahquan Klugh, [name] 02/26/2022, 6:30 AM

## 2022-02-26 NOTE — Progress Notes (Signed)
Recreational Therapy Session Note  Patient Details  Name: Janet Hensley MRN: [id number] Date of Birth: [birth year] Today's Date: 02/26/2022  Pain:  no c/o  Pt participated in Community reintegration/outing to [employer]  at overall supervision w/c level inside the building and supervision-min assist on outdoor uneven surfaces.  Goals focused on safe community mobility, identification & negotiation of obstacles, accessing public restroom, energy conservation techniques/education.  See outing goal sheet in shadow chart for full details.  Therapy/Group: [employer]   Jayel Scaduto 02/26/2022, 8:39 AM

## 2022-02-26 NOTE — Progress Notes (Signed)
Physical Therapy Discharge Summary  Patient Details  Name: Janet Hensley MRN: [id number] Date of Birth: 1970/11/08  Date of Discharge from PT service:February 27, 2022  Today's Date: 02/27/2022 PT Missed Time: 45 Minutes Missed Time Reason: Patient unwilling to participate;Other (Comment) (reports being ready for D/C)  Patient has met 7 of 9 long term goals due to improved activity tolerance, improved balance, increased strength, ability to compensate for deficits, improved attention, and improved awareness.  Patient to discharge at a wheelchair level [place]. Patient's care partner is independent to provide the necessary physical assistance at discharge.  Reasons goals not met: Pt requires up to min assist for dynamic standing balance tasks and requires min assist for car transfers.  Recommendation:  Patient will benefit from ongoing skilled PT services in outpatient setting to continue to advance safe functional mobility, address ongoing impairments in global strengthening, dynamic stability, endurance/activity tolerance, and minimize fall risk.  Equipment: 18" manual wheelchair with cushion and rolling walker  Reasons for discharge: treatment goals met and discharge from hospital  Patient/family agrees with progress made and goals achieved: Yes  Skilled Therapeutic Interventions/Progress Updates:  Pt received asleep, supine in bed easily awakens then reports "they done got my stomach screwed up again today" going on to say that it is from eating breakfast so early and having to do therapy immediately afterwards. Pt reports she cannot tolerate doing therapy out of the room at this time because she is sure it will make her throw up. Pt goes on to state "I feel like I've learned everything I need to learn so I'm taking today off." Pt appreciative of therapist ensuring she felt safe with D/C plans but politely declines participation at this time. Therapist left pt supine in bed with  needs in reach and bed alarm on. Below information completed by primary PT.  PT Discharge Precautions/Restrictions Precautions Precautions: Fall Restrictions Weight Bearing Restrictions: No Pain Interference Pain Interference Pain Effect on Sleep: 2. Occasionally Pain Interference with Therapy Activities: 1. Rarely or not at all Pain Interference with Day-to-Day Activities: 1. Rarely or not at all Vision/Perception  Vision - History Ability to See in Adequate Light: 0 Adequate Perception Perception: Within Functional Limits Praxis Praxis: Intact  Cognition Overall Cognitive Status: Within Functional Limits for tasks assessed Arousal/Alertness: Awake/alert Orientation Level: Oriented X4 Memory: Impaired Memory Impairment: Decreased recall of new information;Decreased short term memory Awareness: Appears intact Problem Solving: Impaired Safety/Judgment: Appears intact Sensation Sensation Light Touch: Appears Intact Coordination Gross Motor Movements are Fluid and Coordinated: No Fine Motor Movements are Fluid and Coordinated: Yes Coordination and Movement Description: Significantly improved since initial evaluation Motor  Motor Motor - Skilled Clinical Observations: generalized global weakness, but much improved  Mobility Bed Mobility Bed Mobility: Rolling Right;Rolling Left;Sit to Supine;Supine to Sit Rolling Right: Independent with assistive device Rolling Left: Independent with assistive device Supine to Sit: Independent with assistive device Sit to Supine: Independent with assistive device Transfers Transfers: Sit to Stand;Stand to Sit;Stand Pivot Transfers Sit to Stand: Minimal Assistance - Patient > 75% Stand to Sit: Minimal Assistance - Patient > 75% Stand Pivot Transfers: Contact Guard/Touching assist Transfer (Assistive device): Rolling walker Locomotion  Gait Ambulation: Yes Gait Assistance: Contact Guard/Touching assist Gait Distance (Feet): 150  Feet Assistive device: Rolling walker Gait Gait: Yes High Level Ambulation High Level Ambulation: Side stepping Stairs / Additional Locomotion Stairs: Yes Stairs Assistance: Minimal Assistance - Patient > 75% Stair Management Technique: One rail Left Number of Stairs: 4  Height of Stairs: 6 Ramp: Contact Guard/touching assist Curb: Minimal Assistance - Patient >75% Architect: Yes Wheelchair Assistance: Independent with Camera operator: Both upper extremities Wheelchair Parts Management: Needs assistance Distance: 150'  Trunk/Postural Assessment  Cervical Assessment Cervical Assessment: Exceptions to [facility] (Forward head) Thoracic Assessment Thoracic Assessment: Exceptions to [facility] (Rounded shoulders) Lumbar Assessment Lumbar Assessment: Exceptions to [facility] (Posterior pelvic tilt) Postural Control Postural Control: Deficits on evaluation Righting Reactions: delayed and inadequate, but improving  Balance Balance Balance Assessed: Yes Static Sitting Balance Static Sitting - Balance Support: Feet supported;Bilateral upper extremity supported Static Sitting - Level of Assistance: 6: Modified independent (Device/Increase time) Dynamic Sitting Balance Dynamic Sitting - Balance Support: Feet unsupported;During functional activity Dynamic Sitting - Level of Assistance: 6: Modified independent (Device/Increase time) Static Standing Balance Static Standing - Balance Support: Bilateral upper extremity supported Static Standing - Level of Assistance: 5: Stand by assistance Dynamic Standing Balance Dynamic Standing - Balance Support: During functional activity Dynamic Standing - Level of Assistance: 4: Min assist (Min/CGA) Extremity Assessment      RLE Assessment RLE Assessment: Exceptions to [facility] General Strength Comments: Grossly 3+/5 LLE Assessment LLE Assessment: Exceptions to [facility] General Strength Comments: Grossly  3+/5   Janet Hensley 02/26/2022, 4:18 PM

## 2022-02-26 NOTE — Progress Notes (Signed)
Physical Therapy Session Note  Patient Details  Name: Janet Hensley MRN: [id number] Date of Birth: [birth year]  Today's Date: 02/26/2022 PT Individual Time: 1st Treatment Session: 1030-1200; 2nd Treatment Session: 1455-1550 PT Individual Time Calculation (min): 90 min; 55 min  Short Term Goals: Week 3:  PT Short Term Goal 1 (Week 3): STG=LTG secondary to ELOS  Skilled Therapeutic Interventions/Progress Updates:  1st Treatment Session- Patient greeted standing in room with RW and NT present assisting with transfer to wheelchair- Patient agreeable and excited for community outing. Patient participated in Community reintegration/outing to [employer] at [employer] level. Goals focused on safe community mobility, identification and negotiation of obstacles, accessing public restroom, energy conservation techniques/education. Patient performed various sit/stands throughout treatment session to transfer to/from [employer] and wheelchair with RW and MinA. Patient was able to maneuver wheelchair between confined spaces and up/down elevated surfaces with B UE and MinA. See recreational therapist's notes for full details. Patient returned to her room sitting upright in wheelchair with call bell within reach, tray table in front and all needs met.    2nd Treatment Session- Patient greeted supine in bed and agreeable to PT treatment session- Patient requesting to use the restroom prior to start of session. Patient transitioned from supine to sitting EOB with use of bed rail (which patient will have at home) and ModI. Patient stood from EOB with RW and MinA- Patient then ambulated to/from the bathroom with RW and CGA for safety. Patient was able to manage pants/pericare with CGA for safety. Patient wheeled to rehab gym for time management and energy conservation.   Patient ascended/descended x4 steps with L HR and ModA- Patient performed stair mobility with clog type shoes on making it increasingly difficult due  to the bulkiness of the shoe. Patient ascended the 4 steps again with L HR and heavy MinA with tennis shoes donned- Patient ascended leading with L LE and descended with R LE using a step-to pattern.   Patient ambulated to/from car simulator with RW and CGA- Once seated in the car, patient was able to place B LE into/out of the car independently. Patient required MinA for initiating standing from the car secondary to weakness.   Patient ascended/descended a low grade ramp with RW and CGA for safety with no LOB noted and good ability to navigate threshold.   Patient propelled manual wheelchair with B UE >150' with RW and ModI. Once to her room, patient stood from wheelchair with RW and MinA- Patient then ambulated to EOB with RW and CGA. Patient left sitting EOB with call bell within reach, tray table in front and all needs met.   Therapy Documentation Precautions:  Precautions Precautions: Fall Restrictions Weight Bearing Restrictions: No   Therapy/Group: Individual Therapy  Bryceton Hantz 02/26/2022, 7:53 AM

## 2022-02-26 NOTE — Progress Notes (Signed)
PROGRESS NOTE   Subjective/Complaints:  No events overnight. No complaints. 2x large BM yesterday per patient, with improved nausea. KUB without significant stool burden. Looking forward to outing today.   Using flutter valve and ISB daily, reporting improvements with this.    ROS: Patient denies fever, rash, sore throat, blurred vision, dizziness,   diarrhea, cough, shortness of breath or chest pain, joint or back/neck pain, headache, or mood change.    Objective:   DG Abd 1 View  Result Date: 02/25/2022 CLINICAL DATA:  Constipation EXAM: ABDOMEN - 1 VIEW COMPARISON:  02/17/2022 FINDINGS: Scattered large and small bowel gas is noted. No significant retained fecal material is noted. No obstructive changes are seen. No free air is noted. No bony abnormality is seen. IMPRESSION: No evidence of constipation.  No acute abnormality noted. Electronically Signed   By: Alcide Clever M.D.   On: 02/25/2022 21:27   Recent Labs    02/24/22 0550  WBC 10.0  HGB 9.4*  HCT 29.5*  PLT 387    Recent Labs    02/24/22 0550  NA 135  K 3.8  CL 102  CO2 21*  GLUCOSE 84  BUN 8  CREATININE 0.71  CALCIUM 10.8*     Intake/Output Summary (Last 24 hours) at 02/26/2022 1001 Last data filed at 02/25/2022 1900 Gross per 24 hour  Intake 480 ml  Output --  Net 480 ml         Physical Exam: Vital Signs Blood pressure (!) 102/55, pulse 83, temperature 98.1 F (36.7 C), resp. rate 18, height 5\' 3"  (1.6 m), weight 70.4 kg, last menstrual period 01/16/2016, SpO2 99 %.   Constitutional: No distress . Vital signs reviewed.  HEENT: NCAT, EOMI, oral membranes moist Neck: supple Cardiovascular: mild tachycardia without murmur. No JVD    Respiratory/Chest: CTAB. +breathy vocal quality. +Reduced breath sounds in LLL - unchanged GI/Abdomen: BS +, non-tender, non-distended Ext: no clubbing, cyanosis, or edema Psych: pleasant and cooperative   Skin: No evidence of breakdown, no evidence of rash. +tracheostomy site closed, open to air.  Neurological: AAOx3. Appropriate.  No apparent deficits. Follows all commands. Musculoskeletal: Moving all 4 limbs 5/5 in WC     Assessment/Plan: 1. Functional deficits which require 3+ hours per day of interdisciplinary therapy in a comprehensive inpatient rehab setting. Physiatrist is providing close team supervision and 24 hour management of active medical problems listed below. Physiatrist and rehab team continue to assess barriers to discharge/monitor patient progress toward functional and medical goals  Care Tool:  Bathing    Body parts bathed by patient: Chest, Abdomen, Right upper leg, Left upper leg, Face   Body parts bathed by helper: Left lower leg, Right lower leg, Front perineal area, Buttocks, Right arm, Left arm     Bathing assist Assist Level: Maximal Assistance - Patient 24 - 49%     Upper Body Dressing/Undressing Upper body dressing   What is the patient wearing?: Pull over shirt    Upper body assist Assist Level: Moderate Assistance - Patient 50 - 74%    Lower Body Dressing/Undressing Lower body dressing      What is the patient wearing?: Pants  Lower body assist Assist for lower body dressing: Maximal Assistance - Patient 25 - 49%     Toileting Toileting    Toileting assist Assist for toileting: Total Assistance - Patient < 25%     Transfers Chair/bed transfer  Transfers assist     Chair/bed transfer assist level: Contact Guard/Touching assist     Locomotion Ambulation   Ambulation assist      Assist level: Contact Guard/Touching assist Assistive device: Walker-rolling Max distance: 180'   Walk 10 feet activity   Assist  Walk 10 feet activity did not occur: Safety/medical concerns  Assist level: Contact Guard/Touching assist Assistive device: Walker-rolling   Walk 50 feet activity   Assist Walk 50 feet with 2 turns  activity did not occur: Safety/medical concerns  Assist level: Contact Guard/Touching assist Assistive device: Walker-rolling    Walk 150 feet activity   Assist Walk 150 feet activity did not occur: Safety/medical concerns  Assist level: Contact Guard/Touching assist Assistive device: Walker-rolling    Walk 10 feet on uneven surface  activity   Assist Walk 10 feet on uneven surfaces activity did not occur: Safety/medical concerns         Wheelchair     Assist Is the patient using a wheelchair?: Yes Type of Wheelchair: Manual    Wheelchair assist level: Supervision/Verbal cueing Max wheelchair distance: 150'    Wheelchair 50 feet with 2 turns activity    Assist        Assist Level: Supervision/Verbal cueing   Wheelchair 150 feet activity     Assist      Assist Level: Supervision/Verbal cueing   Blood pressure (!) 102/55, pulse 83, temperature 98.1 F (36.7 C), resp. rate 18, height 5\' 3"  (1.6 m), weight 70.4 kg, last menstrual period 01/16/2016, SpO2 99 %.  Medical Problem List and Plan: 1. 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  Antithrombotics: -DVT/anticoagulation:  Pharmaceutical: Heparin- will try and change to Lovenox- as long as labs in AM are OK -> HgB stable, Cr WNL, switch to Lovenox inj 40 mg daily              -antiplatelet therapy: N/A 3. Pain Management: Lidoderm patch, Tylenol as needed - stable  - Reports chronic neck pain with Percocet 10 mg at home; was getting Oxy 5 mg Q6H PRN per admission notes; Oxy 2.5 mg Q6H PRN, will wean as tolerated. Cannot prescribe pain medication on discharge d/t documented history of snorting oxycodone; patient aware.   - States she gets gabapentin + Lyrica for peripheral neuropathy at home; not  currently bothersome; monitor  1/20- will change bed to air mattress ot help with back pain.   1/22 - reverted back to regular mattress d.t increase back pain  4. Mood/Behavior/Sleep:              -antipsychotic agents: Clonazepam 0.5 mg 3 times daily, melatonin 3 mg nightly  1/23: Encourage compliance with timed toiletting due to reported incontinence d/t fatigue, unwilling to get OOB - improving   5. Neuropsych/cognition: This patient is capable of making decisions on her own behalf.  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 Skin/Wound Care Stage II L inner buttock pressure ulcer- from [place]; tracheostomy opening - will do foam dressing and monitor clinically - Routine skin checks - tracheostomy site; healed, open to air  7. Fluids/Electrolytes/Nutrition: Routine in and outs with follow-up chemistries - stable - Ca elevated but stable, monitor - Low protein/albumin, added Boost supplement TID for nutrition and wound healing  - Intermittent low K-> normalized at K dur 40 meq BID to 3.8; refused IV repletion d/t burning, intermittent PO refusal d/t nausea - Na improved from 129->135 with DC IV fluids. Encourage improved POs  8.  Respiratory failure/tracheostomy.  Decannulated 1 week PTA- no air leak.  Check oxygen saturations every shift- off O2- current sats 96-97% - Admission: Coarse cough with light green sputum- will check f/u CXR  - 1/11 CXR: Multifocal airspace consolidation throughout the left lung appears stable. R lung clear. Vitals stable on RA. Continue current management.    1/14- WBC up to 17- per #15  1/14: WBC downtrending; likely source UTI  1/17: Afebrile, vitals stable, WBC continues downtrend on UTI Tx. Mild sputum production, complex pathology on CT abdomen but appears stable  from prior images. Low suspicion for recurrent pneumonia, monitor closely  1/23: Duonebs Q8h PRN for SOB added. CXR today for subjective SOB; given improved WBC stable at 10-12 low suspicion for recurrent PNA  1/24: IR to evalaute for potential effusion tap, given re-loculated on CXR -> no tap recommended at this time; monitor and continue duonebs for symptomatic SOB  1/26-27: duonebs, albuterol nebs, and flutter valve for SOB and productive cough  1/29: Improving tolerance overall; will need pulm f/u on DC for loculated effusion, given no intervention   9.  AKI.  Resolved. creatinine 0.4-0.7.  Follow-up chemistries  - Intermittent IVF for elevations with nausea/vomitting, stable now on PO diet  10.  History of tobacco polysubstance use.  Provide counseling  - Patient Hx snorting her prescribed oxycodone; understands no narcotic scripts from hospital at discharge  11.  Prediabetes.  Hemoglobin A1c 6.1.  Blood sugar checks discontinued  12.  Dysphagia.  Dysphagia #2 thin liquids.  Follow-up speech therapy - stable  1/16: NPO except sips with meds and ice chips d/t concern for colitis, awaiting GI eval  1/17: Clear liquid diet today, NPO at midnight for scope.   1/18: Cleared by GI to resume diet  - Dys 3 with thins 1/19 Diet Orders (From admission, onward)     Start     Ordered   02/25/22 0854  Diet regular Room service appropriate? Yes; Fluid consistency: Thin  Diet effective now       Question Answer Comment  Room service appropriate? Yes   Fluid consistency: Thin      02/25/22 0853             13. Constipation- ongoing  1/14- had moderate stool burden- given SMOG enema last night- pt having bright red clots since then  1/18: BID miralax per GI; had multiple enemas for bowel prep  1/22: Amitiza per home regimen, suppository PRN pending KUB showed scattered stool, no acute findings. Resume miralax BID when tolerating POs.   1/23: Encouraging POS, changed Miralax to Colace 100  mg BID, also drinking prune juice. Did have small BM with suppository overnight.  1/25: added Senna 7.6 mg 1 tab  QHS d/t poor miralax tolerance.  1/29: BM per patient 1/28; check UA d/t midline suprapubic pain, nausea, and dysuria - negative 1/30: KUB today for worsening nausea. Poor POs, LBM per nursing 1/26. Added Boost per patient for mealtime POs.  1/31: KUB WNL. 2x BM yesterday. Nausea improved.   14. BRBPR w/ anemia, colitis - resolved  1/17: GI consulted, CT abdomen overnight with results showing ?rectosigmoid colitis. Unlikely infectious. Cleared for clear liquid diet today, patient to have colonoscopy vs. Flex sigmoidoscopy in AM; +/- EGD depending on iron panel.   1/18: See procedure note. Nonspecific colitis observed on flex sig. Biopsies pending. Per GI, cleared for PO diet with gentle miralax 17 g BID bowel regimen. Iron panel with low TIBC, high ferritin = likely anemia chronic disease, low suspicion for acute bleed.    15. Leukocytosis - Uti +/- colitis - resolved  1/14- WBC up to 17k from 11k- UTI started Keflex 500 mg q8 hours and since not voiding a lot, will start NS IVFs 75cc hour for 1 day -> switched to Rocephin 1 G daily d/t poor PO tolerance, plan to switch back to PO once improved  1/19: Switched to ampicillin 500 mg TID PO for 3 days; WBC WNL - finished regimen  1/29 labs stable, UA as above stable  17. Tachycardia - mild , asymptomatic - stable Vitals:   02/25/22 1943 02/26/22 0450  BP: 112/65 (!) 102/55  Pulse: 92 83  Resp: 17 18  Temp: 98.2 F (36.8 C) 98.1 F (36.7 C)  SpO2: 100% 99%      LOS: 20 days A FACE TO FACE EVALUATION WAS PERFORMED  Gertie Gowda 02/26/2022, 10:01 AM

## 2022-02-26 NOTE — Progress Notes (Signed)
Occupational Therapy Session Note  Patient Details  Name: Janet Hensley MRN: [id number] Date of Birth: Apr 17, 1970  Today's Date: 02/26/2022 OT Individual Time: 7793-9030 OT Individual Time Calculation (min): 78 min    Short Term Goals: Week 1:  OT Short Term Goal 1 (Week 1): Patient will complete toilet transfer with mod A  of 1 OT Short Term Goal 1 - Progress (Week 1): Progressing toward goal OT Short Term Goal 2 (Week 1): Patient will complete 1 step of toileting task OT Short Term Goal 2 - Progress (Week 1): Met OT Short Term Goal 3 (Week 1): Patient will stand at the sink with mod A of 1 in preparation for BADL Task. OT Short Term Goal 3 - Progress (Week 1): Progressing toward goal  Skilled Therapeutic Interventions/Progress Updates:    Pt received in bed with 7 out of 10 pain in back. Medication, Rest and repositiong provided for pain relief. Pt spent beginning looking for clothing, however did not   ADL: Pt completes ADL at overall MIN-MOD Level. Skilled interventions include: Sup>sit with supervision and HOB elevated Pt threads BLE into pants + dons socks in seated figure 4 MOD A power up from low bed height with cuing for foot placement for transitional movements with RW. Pt often tends to leave feet out instead of pulling underneath her  Functional mobility into and out of bathroom with RW and MIN A overall with cuing for negotiating RW over threshold Pt declines shower this date and grooms at sink seated with setup. Total A to braid hair.   Therapeutic activity Practiced 1 stair 2x after discussing set up. Pt needs heavy MIN A to use 1 rail with support at pelvis. No buckling noted.  Pt left at end of session in bed with exit alarm on, call light in reach and all needs met   Therapy Documentation Precautions:  Precautions Precautions: Fall Restrictions Weight Bearing Restrictions: No General:   Therapy/Group: Individual Therapy  Tonny Branch 02/26/2022,  8:49 AM

## 2022-02-27 ENCOUNTER — Other Ambulatory Visit (HOSPITAL_COMMUNITY): Payer: Self-pay

## 2022-02-27 DIAGNOSIS — G7281 Critical illness myopathy: Secondary | ICD-10-CM | POA: Diagnosis not present

## 2022-02-27 MED ORDER — SENNA 8.6 MG PO TABS
1.0000 | ORAL_TABLET | Freq: Every day | ORAL | 0 refills | Status: DC
  Filled 2022-02-27: qty 30, 30d supply, fill #0

## 2022-02-27 MED ORDER — DOCUSATE SODIUM 100 MG PO CAPS
100.0000 mg | ORAL_CAPSULE | Freq: Two times a day (BID) | ORAL | 0 refills | Status: DC
  Filled 2022-02-27: qty 60, 30d supply, fill #0

## 2022-02-27 MED ORDER — LUBIPROSTONE 24 MCG PO CAPS
24.0000 ug | ORAL_CAPSULE | Freq: Two times a day (BID) | ORAL | 0 refills | Status: AC
  Filled 2022-02-27: qty 60, 30d supply, fill #0

## 2022-02-27 MED ORDER — FAMOTIDINE 20 MG PO TABS
20.0000 mg | ORAL_TABLET | Freq: Two times a day (BID) | ORAL | 0 refills | Status: DC
  Filled 2022-02-27: qty 60, 30d supply, fill #0

## 2022-02-27 MED ORDER — MIDODRINE HCL 10 MG PO TABS
10.0000 mg | ORAL_TABLET | Freq: Three times a day (TID) | ORAL | 0 refills | Status: DC
  Filled 2022-02-27: qty 90, 30d supply, fill #0

## 2022-02-27 MED ORDER — CLONAZEPAM 0.5 MG PO TABS
0.5000 mg | ORAL_TABLET | Freq: Three times a day (TID) | ORAL | 0 refills | Status: AC
  Filled 2022-02-27: qty 45, 15d supply, fill #0

## 2022-02-27 MED ORDER — POTASSIUM CHLORIDE CRYS ER 20 MEQ PO TBCR
40.0000 meq | EXTENDED_RELEASE_TABLET | Freq: Two times a day (BID) | ORAL | 0 refills | Status: DC
  Filled 2022-02-27: qty 120, 30d supply, fill #0

## 2022-02-27 MED ORDER — POLYETHYLENE GLYCOL 3350 17 GM/SCOOP PO POWD
17.0000 g | Freq: Every day | ORAL | 0 refills | Status: DC
  Filled 2022-02-27: qty 238, 14d supply, fill #0

## 2022-02-27 MED ORDER — MELATONIN 3 MG PO TABS
3.0000 mg | ORAL_TABLET | Freq: Every day | ORAL | 0 refills | Status: AC
  Filled 2022-02-27: qty 30, 30d supply, fill #0

## 2022-02-27 NOTE — Plan of Care (Signed)
  Problem: RH Swallowing Goal: LTG Patient will consume least restrictive diet using compensatory strategies with assistance (SLP) Description: LTG:  Patient will consume least restrictive diet using compensatory strategies with assistance (SLP) Outcome: Completed/Met Goal: LTG Patient will participate in dysphagia therapy to increase swallow function with assistance (SLP) Description: LTG:  Patient will participate in dysphagia therapy to increase swallow function with assistance (SLP) Outcome: Completed/Met Goal: LTG Pt will demonstrate functional change in swallow as evidenced by bedside/clinical objective assessment (SLP) Description: LTG: Patient will demonstrate functional change in swallow as evidenced by bedside/clinical objective assessment (SLP) Outcome: Completed/Met   Problem: RH Problem Solving Goal: LTG Patient will demonstrate problem solving for (SLP) Description: LTG:  Patient will demonstrate problem solving for basic/complex daily situations with cues  (SLP) Outcome: Completed/Met   Problem: RH Memory Goal: LTG Patient will demonstrate ability for day to day (SLP) Description: LTG:   Patient will demonstrate ability for day to day recall/carryover during cognitive/linguistic activities with assist  (SLP) Outcome: Completed/Met Goal: LTG Patient will use memory compensatory aids to (SLP) Description: LTG:  Patient will use memory compensatory aids to recall biographical/new, daily complex information with cues (SLP) Outcome: Completed/Met

## 2022-02-27 NOTE — Plan of Care (Signed)
  Problem: RH Balance Goal: LTG Patient will maintain dynamic standing with ADLs (OT) Description: LTG:  Patient will maintain dynamic standing balance with assist during activities of daily living (OT)  Outcome: Completed/Met   Problem: Sit to Stand Goal: LTG:  Patient will perform sit to stand in prep for activites of daily living with assistance level (OT) Description: LTG:  Patient will perform sit to stand in prep for activites of daily living with assistance level (OT) Outcome: Completed/Met   Problem: RH Eating Goal: LTG Patient will perform eating w/assist, cues/equip (OT) Description: LTG: Patient will perform eating with assist, with/without cues using equipment (OT) Outcome: Completed/Met   Problem: RH Grooming Goal: LTG Patient will perform grooming w/assist,cues/equip (OT) Description: LTG: Patient will perform grooming with assist, with/without cues using equipment (OT) Outcome: Completed/Met   Problem: RH Bathing Goal: LTG Patient will bathe all body parts with assist levels (OT) Description: LTG: Patient will bathe all body parts with assist levels (OT) Outcome: Completed/Met   Problem: RH Dressing Goal: LTG Patient will perform upper body dressing (OT) Description: LTG Patient will perform upper body dressing with assist, with/without cues (OT). Outcome: Completed/Met Goal: LTG Patient will perform lower body dressing w/assist (OT) Description: LTG: Patient will perform lower body dressing with assist, with/without cues in positioning using equipment (OT) Outcome: Completed/Met   Problem: RH Toileting Goal: LTG Patient will perform toileting task (3/3 steps) with assistance level (OT) Description: LTG: Patient will perform toileting task (3/3 steps) with assistance level (OT)  Outcome: Completed/Met   Problem: RH Functional Use of Upper Extremity Goal: LTG Patient will use RT/LT upper extremity as a (OT) Description: LTG: Patient will use right/left upper  extremity as a stabilizer/gross assist/diminished/nondominant/dominant level with assist, with/without cues during functional activity (OT) Outcome: Completed/Met   Problem: RH Toilet Transfers Goal: LTG Patient will perform toilet transfers w/assist (OT) Description: LTG: Patient will perform toilet transfers with assist, with/without cues using equipment (OT) Outcome: Completed/Met   Problem: RH Tub/Shower Transfers Goal: LTG Patient will perform tub/shower transfers w/assist (OT) Description: LTG: Patient will perform tub/shower transfers with assist, with/without cues using equipment (OT) Outcome: Completed/Met   

## 2022-02-27 NOTE — Progress Notes (Signed)
Occupational Therapy Discharge Summary  Patient Details  Name: Janet Hensley MRN: [id number] Date of Birth: Janet Hensley 12, 1972  Date of Discharge from OT service:February 28, 2022  Today's Date: 02/27/2022 OT Individual Time: 1000-1100 OT Individual Time Calculation (min): 60 min    OT treatment session focused on increased independence with BADL tasks. Pt needed min A to stand w/ RW and CGA to ambulate into bathroom for shower. Bathing tasks completed from tub bench using lateral leans to wash buttocks with supervision. Dressing completed with CGA for balance when standing to pull up pants, but mostly supervision. Pt returned to bed at end of session with CGA to ambulate w/ RW. Pt left semi-reclined in bed with bed alarm on, call bell in reach, and needs met.   Patient has met 11 of 11 long term goals due to improved activity tolerance, improved balance, postural control, ability to compensate for deficits, functional use of  RIGHT upper, RIGHT lower, LEFT upper, and LEFT lower extremity, improved attention, improved awareness, and improved coordination.  Patient to discharge at [facility] Assist level.  Patient's care partner is independent to provide the necessary physical assistance at discharge.    Reasons goals not met: n/a  Recommendation:  Patient will benefit from ongoing skilled OT services in outpatient setting to continue to advance functional skills in the area of BADL, Reduce care partner burden, and functional use of  B UE's .  Equipment: Tub transfer bench, 3-in-1 BSC, RW, WC  Reasons for discharge: treatment goals met and discharge from hospital  Patient/family agrees with progress made and goals achieved: Yes  OT Discharge Precautions/Restrictions  Precautions Precautions: Fall Precaution Comments: trach collar, flexiseal Restrictions Weight Bearing Restrictions: No Pain Pain Assessment Pain Scale: 0-10 Pain Score: 5  Pain Type: Chronic pain Pain Location: Back Pain  Orientation: Lower Pain Radiating Towards: ble Pain Descriptors / Indicators: Aching Pain Frequency: Intermittent Pain Onset: On-going Pain Intervention(s): Medication (See eMAR) ADL ADL Eating: Independent Grooming: Independent Upper Body Bathing: Supervision/safety Lower Body Bathing: Supervision/safety Upper Body Dressing: Supervision/safety Lower Body Dressing: Supervision/safety Toileting: Contact guard Toilet Transfer: Contact guard Perception  Perception: Within Functional Limits Praxis Praxis: Intact Cognition Cognition Overall Cognitive Status: Within Functional Limits for tasks assessed Arousal/Alertness: Awake/alert Orientation Level: Person;Place;Situation Person: Oriented Place: Oriented Situation: Oriented Memory: Impaired Memory Impairment: Decreased recall of new information;Decreased short term memory Awareness: Appears intact Problem Solving: Impaired Safety/Judgment: Appears intact Brief Interview for Mental Status (BIMS) Repetition of Three Words (First Attempt): 3 Temporal Orientation: Year: Correct Temporal Orientation: Month: Accurate within 5 days Temporal Orientation: Day: Correct Recall: "Sock": Yes, no cue required Recall: "Blue": Yes, no cue required Recall: "Bed": Yes, no cue required BIMS Summary Score: 15 Sensation Sensation Light Touch: Appears Intact Coordination Gross Motor Movements are Fluid and Coordinated: No Fine Motor Movements are Fluid and Coordinated: Yes Coordination and Movement Description: Significantly improved since initial evaluation Motor  Motor Motor - Discharge Observations: generalized weakness much improved Mobility  Bed Mobility Bed Mobility: Rolling Right;Rolling Left;Sit to Supine;Supine to Sit Rolling Right: Independent with assistive device Rolling Left: Independent with assistive device Supine to Sit: Independent with assistive device Sit to Supine: Independent with assistive device Transfers Sit  to Stand: Minimal Assistance - Patient > 75% Stand to Sit: Minimal Assistance - Patient > 75%  Balance Balance Balance Assessed: Yes Static Sitting Balance Static Sitting - Balance Support: Feet supported;Bilateral upper extremity supported Static Sitting - Level of Assistance: 6: Modified independent (Device/Increase time) Dynamic Sitting Balance Dynamic  Sitting - Balance Support: Feet unsupported;During functional activity Dynamic Sitting - Level of Assistance: 6: Modified independent (Device/Increase time) Sitting balance - Comments: poor core activation for anterior weight translation in recliner Static Standing Balance Static Standing - Balance Support: Bilateral upper extremity supported Static Standing - Level of Assistance: 5: Stand by assistance Dynamic Standing Balance Dynamic Standing - Balance Support: During functional activity Dynamic Standing - Level of Assistance: 4: Min assist (CGA) Extremity/Trunk Assessment RUE Assessment RUE Assessment: Exceptions to [facility] General Strength Comments: 3+/5 shoulder, 4/5 elbow LUE Assessment LUE Assessment: Exceptions to [facility] General Strength Comments: 3+/5 shoulder, 4/5 elbow   Janet Hensley Janet Hensley 02/27/2022, 12:31 PM

## 2022-02-27 NOTE — Progress Notes (Signed)
Recreational Therapy Discharge Summary Patient Details  Name: Janet Hensley MRN: [id number] Date of Birth: 05-30-1970 Today's Date: 02/27/2022   Comments on progress toward goals: Pt has made great progress during LOS and is excited about discharge home tomorrow at [facility] assist w/c level.  TR sessions focused on pt education in regards to activity analysis identifying potential modifications, coping/stress management, animal assisted activities and community reintegration.  Education also included importance of social, emotional and spiritual health and it impact on rehab, recovery and overall quality of life.  Pt also participate in a community outing at [place] assist w/c level on outdoor uneven surface and supervision inside given extra time for mobility tasks.    Reasons for discharge: discharge from hospital  Follow-up: Outpatient  Patient/family agrees with progress made and goals achieved: Yes  Dezmond Downie 02/27/2022, 8:48 AM

## 2022-02-27 NOTE — Progress Notes (Deleted)
Inpatient Rehabilitation Discharge Medication Review by a Pharmacist  A complete drug regimen review was completed for this patient to identify any potential clinically significant medication issues.  High Risk Drug Classes Is patient taking? Indication by Medication  Antipsychotic No   Anticoagulant No   Antibiotic No   Opioid Yes Oxycodone prn pain  Antiplatelet No   Hypoglycemics/insulin No   Vasoactive Medication Yes Midodrine - BP  Chemotherapy No   Other Yes Clonazepam - anxiety Famotidine - Reflux Amitiza - constipation Duo-neb - prn SOB     Type of Medication Issue Identified Description of Issue Recommendation(s)  Drug Interaction(s) (clinically significant)     Duplicate Therapy     Allergy     No Medication Administration End Date     Incorrect Dose     Additional Drug Therapy Needed     Significant med changes from prior encounter (inform family/care partners about these prior to discharge).    Other       Clinically significant medication issues were identified that warrant physician communication and completion of prescribed/recommended actions by midnight of the next day:  No  Pharmacist comments: None  Time spent performing this drug regimen review (minutes):  30 minutes  Thank you Anette Guarneri, PharmD 02/27/2022 12:32 PM

## 2022-02-27 NOTE — Progress Notes (Signed)
Inpatient Rehabilitation Care Coordinator Discharge Note   Patient Details  Name: Janet Hensley Cronic MRN: [id number] Date of Birth: 1970-09-15   Discharge location: D/[place] to home  Length of Stay: 21 days  Discharge activity level: discharge at a wheelchair level [place] using RW  Home/community participation: Limited  Patient response HU:TMLYYT Literacy - How often do you need to have someone help you when you read instructions, pamphlets, or other written material from your doctor or pharmacy?: Never  Patient response KP:TWSFKC Isolation - How often do you feel lonely or isolated from those around you?: Never  Services provided included: MD, RD, PT, OT, SLP, RN, CM, TR, Pharmacy, Neuropsych, SW  Financial Services:  Charity fundraiser Utilized: Nutritional therapist and [place] [employer] [place] [facility] [place]  Choices offered to/list presented to: pt and pt husband  Follow-up services arranged:  Outpatient, DME, Patient/Family has no preference for HH/DME agencies    Outpatient Servicies: [facility] location for PT/OT DME : [place] for w/[place], 3in1 BSC, TTB, RW, and nebulizer machine with medication (duoneb)    Patient response to transportation need: Is the patient able to respond to transportation needs?: Yes In the past 12 months, has lack of transportation kept you from medical appointments or from getting medications?: No In the past 12 months, has lack of transportation kept you from meetings, work, or from getting things needed for daily living?: No   Comments (or additional information):  Patient/Family verbalized understanding of follow-up arrangements:  Yes  Individual responsible for coordination of the follow-up plan: contact ot husband Gerald Stabs [phone]  Confirmed correct DME delivered: Rana Snare 02/27/2022    Rana Snare

## 2022-02-27 NOTE — Progress Notes (Signed)
Inpatient Rehabilitation Discharge Medication Review by a Pharmacist  A complete drug regimen review was completed for this patient to identify any potential clinically significant medication issues.  High Risk Drug Classes Is patient taking? Indication by Medication  Antipsychotic No   Anticoagulant No   Antibiotic No   Opioid Yes Oxycodone prn pain  Antiplatelet No   Hypoglycemics/insulin No   Vasoactive Medication Yes Midodrine - BP  Chemotherapy No   Other Yes Clonazepam - anxiety Famotidine - Reflux Amitiza - constipation Duo-neb - prn SOB     Type of Medication Issue Identified Description of Issue Recommendation(s)  Drug Interaction(s) (clinically significant)     Duplicate Therapy     Allergy     No Medication Administration End Date     Incorrect Dose     Additional Drug Therapy Needed     Significant med changes from prior encounter (inform family/care partners about these prior to discharge).    Other       Clinically significant medication issues were identified that warrant physician communication and completion of prescribed/recommended actions by midnight of the next day:  No  Pharmacist comments: None  Time spent performing this drug regimen review (minutes):  30 minutes  Thank you Anette Guarneri, PharmD

## 2022-02-27 NOTE — Progress Notes (Signed)
Occupational Therapy Session Note  Patient Details  Name: Janet Hensley MRN: [id number] Date of Birth: 02/17/1970  Today's Date: 02/27/2022 OT Individual Time: 0730-0830 OT Individual Time Calculation (min): 60 min    Short Term Goals: Week 3:  OT Short Term Goal 1 (Week 3): LTG=STG 2/2 ELOS  Skilled Therapeutic Interventions/Progress Updates:    Pt greeted semi-reclined in bed after eating breakfast. Pt reported some nausea and was afraid if she did too much it would make her throw up her breakfast. OT treatment session focused on dc planning, UB home exercise program and home fine motor program. Education provided regarding safe BADL performance in home environment, home modifications, DME needs, energy conservation techniques, and safety awareness.  Pt's walk-in shower has still not been fixed by her landlord so she will likely have to use the tub shower at home. Have asked case manager if we can switch out shower seat for tub bench. OT went through UB home exercise handout using red theraband. Pt with much improved UB strength since initial evaluation. OT issued soft yellow therapyutty and handout on hand exercises and fine motor home program. Reviewed these with pt. Pt left semi-reclined in bed with bed alarm on, call bell in reach, and needs met.    Therapy Documentation Precautions:  Precautions Precautions: Fall Restrictions Weight Bearing Restrictions: No  Pain:  Denies pain at this time   Therapy/Group: Individual Therapy  Valma Cava 02/27/2022, 8:37 AM

## 2022-02-27 NOTE — Progress Notes (Addendum)
Patient ID: Janet Hensley, female   DOB: 07/13/1970, 52 y.o.   MRN: [id number]  SW ordered TTB with [employer] via parachute at request of OT to swap out for this item instead of shower chair with back. Item will be either be delivered to home or brought to home.   *item is being shipped to home per [place].   SW left message for pt husband Gerald Stabs informing on above, and shared with pt as well.   Loralee Pacas, MSW, [place] Office: [phone] Cell: [phone] Fax: [phone]

## 2022-02-27 NOTE — Progress Notes (Signed)
PROGRESS NOTE   Subjective/Complaints:  No events overnight. Mild Nausea this AM but thinks she ate too soon before therapies. Overall, feels well and ready to DC tomorrow. Did steps yesterday with single hand rail with PT and felt good about her progress.    ROS: Patient denies fever, rash, sore throat, blurred vision, dizziness,   diarrhea, cough, shortness of breath or chest pain, joint or back/neck pain, headache, or mood change.    Objective:   DG Abd 1 View  Result Date: 02/25/2022 CLINICAL DATA:  Constipation EXAM: ABDOMEN - 1 VIEW COMPARISON:  02/17/2022 FINDINGS: Scattered large and small bowel gas is noted. No significant retained fecal material is noted. No obstructive changes are seen. No free air is noted. No bony abnormality is seen. IMPRESSION: No evidence of constipation.  No acute abnormality noted. Electronically Signed   By: Alcide Clever M.D.   On: 02/25/2022 21:27   No results for input(s): "WBC", "HGB", "HCT", "PLT" in the last 72 hours.  No results for input(s): "NA", "K", "CL", "CO2", "GLUCOSE", "BUN", "CREATININE", "CALCIUM" in the last 72 hours.   Intake/Output Summary (Last 24 hours) at 02/27/2022 1057 Last data filed at 02/27/2022 0741 Gross per 24 hour  Intake 780 ml  Output --  Net 780 ml         Physical Exam: Vital Signs Blood pressure 117/68, pulse 95, temperature (!) 97.5 F (36.4 C), temperature source Oral, resp. rate 18, height 5\' 3"  (1.6 m), weight 70.4 kg, last menstrual period 01/16/2016, SpO2 97 %.   Constitutional: No distress . Vital signs reviewed.  HEENT: NCAT, EOMI, oral membranes moist Neck: supple Cardiovascular: mild tachycardia without murmur. No JVD    Respiratory/Chest: CTAB. +breathy vocal quality. +Reduced breath sounds in LLL - unchanged GI/Abdomen: BS +, non-tender, non-distended Ext: no clubbing, cyanosis, or edema Psych: pleasant and cooperative  Skin: No  evidence of breakdown, no evidence of rash. +tracheostomy site closed, open to air.  Neurological: AAOx3. Appropriate.  No apparent deficits. Follows all commands. Musculoskeletal: Moving all 4 limbs 5/5 in bed - unchanged. Plantarflexion of BL feet, can ROM to neutral, unchanged    Assessment/Plan: 1. Functional deficits which require 3+ hours per day of interdisciplinary therapy in a comprehensive inpatient rehab setting. Physiatrist is providing close team supervision and 24 hour management of active medical problems listed below. Physiatrist and rehab team continue to assess barriers to discharge/monitor patient progress toward functional and medical goals  Care Tool:  Bathing    Body parts bathed by patient: Chest, Abdomen, Right upper leg, Left upper leg, Face   Body parts bathed by helper: Left lower leg, Right lower leg, Front perineal area, Buttocks, Right arm, Left arm     Bathing assist Assist Level: Maximal Assistance - Patient 24 - 49%     Upper Body Dressing/Undressing Upper body dressing   What is the patient wearing?: Pull over shirt    Upper body assist Assist Level: Moderate Assistance - Patient 50 - 74%    Lower Body Dressing/Undressing Lower body dressing      What is the patient wearing?: Pants     Lower body assist Assist for lower body  dressing: Maximal Assistance - Patient 25 - 49%     Toileting Toileting    Toileting assist Assist for toileting: Total Assistance - Patient < 25%     Transfers Chair/bed transfer  Transfers assist     Chair/bed transfer assist level: Contact Guard/Touching assist     Locomotion Ambulation   Ambulation assist      Assist level: Contact Guard/Touching assist Assistive device: Walker-rolling Max distance: 180'   Walk 10 feet activity   Assist  Walk 10 feet activity did not occur: Safety/medical concerns  Assist level: Contact Guard/Touching assist Assistive device: Walker-rolling   Walk 50  feet activity   Assist Walk 50 feet with 2 turns activity did not occur: Safety/medical concerns  Assist level: Contact Guard/Touching assist Assistive device: Walker-rolling    Walk 150 feet activity   Assist Walk 150 feet activity did not occur: Safety/medical concerns  Assist level: Contact Guard/Touching assist Assistive device: Walker-rolling    Walk 10 feet on uneven surface  activity   Assist Walk 10 feet on uneven surfaces activity did not occur: Safety/medical concerns   Assist level: Contact Guard/Touching assist Assistive device: Walker-rolling   Wheelchair     Assist Is the patient using a wheelchair?: Yes Type of Wheelchair: Manual    Wheelchair assist level: Independent Max wheelchair distance: 150'    Wheelchair 50 feet with 2 turns activity    Assist        Assist Level: Independent   Wheelchair 150 feet activity     Assist      Assist Level: Independent   Blood pressure 117/68, pulse 95, temperature (!) 97.5 F (36.4 C), temperature source Oral, resp. rate 18, height 5\' 3"  (1.6 m), weight 70.4 kg, last menstrual period 01/16/2016, SpO2 97 %.  Medical Problem List and Plan: 1. 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  Antithrombotics: -DVT/anticoagulation:  Pharmaceutical: Heparin- will try and change to Lovenox- as long as labs in AM are OK -> HgB stable, Cr WNL, switch to Lovenox inj 40 mg daily              -antiplatelet therapy: N/A 3. Pain Management: Lidoderm patch, Tylenol as needed - stable  - Reports chronic neck pain with Percocet 10 mg at home; was getting Oxy 5 mg Q6H PRN per admission notes; Oxy 2.5 mg Q6H PRN, will wean as tolerated. Cannot prescribe pain medication on discharge d/t documented history of snorting  oxycodone; patient aware.   - States she gets gabapentin + Lyrica for peripheral neuropathy at home; not currently bothersome; monitor  1/20- will change bed to air mattress ot help with back pain.   1/22 - reverted back to regular mattress d.t increase back pain  4. Mood/Behavior/Sleep: improved             -antipsychotic agents: Clonazepam 0.5 mg 3 times daily, melatonin 3 mg nightly  1/23: Encourage compliance with timed toiletting due to reported incontinence d/t fatigue, unwilling to get OOB - resolved  5. Neuropsych/cognition: This patient is capable of making decisions on her own behalf.  -   neuropsych consulted -> saw patient 1/26, appreciate adjustment disorder with primary  anxiety and chronic pain; no changes recommended   -team providing ego support  -pt feels that she's doing a little better with anxiety  6. Skin/Wound Care Stage II L inner buttock pressure ulcer- from [place]; tracheostomy opening - will do foam dressing and monitor clinically - Routine skin checks - tracheostomy site; healed, open to air  7. Fluids/Electrolytes/Nutrition: Routine in and outs with follow-up chemistries - stable - Ca elevated but stable, monitor - Low protein/albumin, added Boost supplement TID for nutrition and wound healing  - Intermittent low K-> normalized at K dur 40 meq BID to 3.8; refused IV repletion d/t burning, intermittent PO refusal d/t nausea - Na improved from 129->135 with DC IV fluids. Encourage improved POs  8.  Respiratory failure/tracheostomy.  Decannulated 1 week PTA- no air leak.  Check oxygen saturations every shift- off O2-stable respiratory status - Admission: Coarse cough with light green sputum- will check f/u CXR  - 1/11 CXR: Multifocal airspace consolidation throughout the left lung appears stable. R lung clear. Vitals stable on RA. Continue current management.    1/14- WBC up to 17- per #15  1/14: WBC downtrending; likely source UTI  1/17: Afebrile, vitals  stable, WBC continues downtrend on UTI Tx. Mild sputum production, complex pathology on CT abdomen but appears stable from prior images. Low suspicion for recurrent pneumonia, monitor closely  1/23: Duonebs Q8h PRN for SOB added. CXR today for subjective SOB; given improved WBC stable at 10-12 low suspicion for recurrent PNA  1/24: IR to evalaute for potential effusion tap, given re-loculated on CXR -> no tap recommended at this time; monitor and continue duonebs for symptomatic SOB  1/26-27: duonebs, albuterol nebs, and flutter valve for SOB and productive cough  1/29: Improving tolerance overall; will need pulm f/u on DC for loculated effusion, given no intervention   9.  AKI.  Resolved. creatinine 0.4-0.7.  Follow-up chemistries  - Intermittent IVF for elevations with nausea/vomitting, stable now on PO diet  10.  History of tobacco polysubstance use.  Provide counseling  - Patient Hx snorting her prescribed oxycodone; understands no narcotic scripts from hospital at discharge  11.  Prediabetes.  Hemoglobin A1c 6.1.  Blood sugar checks discontinued  12.  Dysphagia.  Dysphagia #2 thin liquids.  Follow-up speech therapy - stable  1/16: NPO except sips with meds and ice chips d/t concern for colitis, awaiting GI eval  1/17: Clear liquid diet today, NPO at midnight for scope.   1/18: Cleared by GI to resume diet  - Dys 3 with thins 1/19 Diet Orders (From admission, onward)     Start     Ordered   02/25/22 0854  Diet regular Room service appropriate? Yes; Fluid consistency: Thin  Diet effective now       Question Answer Comment  Room service appropriate? Yes   Fluid consistency: Thin      02/25/22 0853             13. Constipation- improved, intermittent  1/14- had moderate stool burden- given SMOG enema last night- pt having bright red clots since then  1/18: BID miralax per GI; had multiple enemas for bowel prep  1/22: Amitiza per home regimen, suppository PRN pending KUB showed  scattered stool, no acute findings. Resume miralax BID when tolerating POs.   1/23: Encouraging POS, changed Miralax to Colace 100 mg BID, also drinking prune juice. Did have small BM with suppository overnight.  1/25: added Senna 7.6 mg 1 tab QHS d/t poor miralax  tolerance.  1/29: BM per patient 1/28; check UA d/t midline suprapubic pain, nausea, and dysuria - negative 1/30: KUB today for worsening nausea. Poor POs, LBM per nursing 1/26. Added Boost per patient for mealtime POs.  1/31: KUB WNL. 2x BM yesterday. Nausea improved.   14. BRBPR w/ anemia, colitis - resolved  1/17: GI consulted, CT abdomen overnight with results showing ?rectosigmoid colitis. Unlikely infectious. Cleared for clear liquid diet today, patient to have colonoscopy vs. Flex sigmoidoscopy in AM; +/- EGD depending on iron panel.   1/18: See procedure note. Nonspecific colitis observed on flex sig. Biopsies pending. Per GI, cleared for PO diet with gentle miralax 17 g BID bowel regimen. Iron panel with low TIBC, high ferritin = likely anemia chronic disease, low suspicion for acute bleed.    15. Leukocytosis - Uti +/- colitis - resolved  1/14- WBC up to 17k from 11k- UTI started Keflex 500 mg q8 hours and since not voiding a lot, will start NS IVFs 75cc hour for 1 day -> switched to Rocephin 1 G daily d/t poor PO tolerance, plan to switch back to PO once improved  1/19: Switched to ampicillin 500 mg TID PO for 3 days; WBC WNL - finished regimen  1/29 labs stable, UA as above stable  17. Tachycardia - mild , asymptomatic - stable Vitals:   02/26/22 1925 02/27/22 0524  BP: 111/69 117/68  Pulse: 96 95  Resp: 17 18  Temp: 98 F (36.7 C) (!) 97.5 F (36.4 C)  SpO2: 98% 97%      LOS: 21 days A FACE TO FACE EVALUATION WAS PERFORMED  Gertie Gowda 02/27/2022, 10:57 AM

## 2022-02-28 ENCOUNTER — Other Ambulatory Visit (HOSPITAL_COMMUNITY): Payer: Self-pay

## 2022-02-28 DIAGNOSIS — G7281 Critical illness myopathy: Secondary | ICD-10-CM | POA: Diagnosis not present

## 2022-02-28 NOTE — Progress Notes (Signed)
Patient belongings packed, PA in room going over D/c instructions. Patient has medications in trans pharm, informed tech to call when the meds are ready and RN will pick up.

## 2022-02-28 NOTE — Progress Notes (Signed)
PROGRESS NOTE   Subjective/Complaints:  No events overnight. No acute complaints. Feels ready for discharge today.    ROS: Patient denies fever, rash, sore throat, blurred vision, dizziness,   diarrhea, cough, shortness of breath or chest pain, joint or back/neck pain, headache, or mood change.    Objective:   No results found. No results for input(s): "WBC", "HGB", "HCT", "PLT" in the last 72 hours.  No results for input(s): "NA", "K", "CL", "CO2", "GLUCOSE", "BUN", "CREATININE", "CALCIUM" in the last 72 hours.   Intake/Output Summary (Last 24 hours) at 02/28/2022 1343 Last data filed at 02/27/2022 2000 Gross per 24 hour  Intake 240 ml  Output 0 ml  Net 240 ml         Physical Exam: Vital Signs Blood pressure 101/68, pulse 84, temperature 98 F (36.7 C), temperature source Oral, resp. rate 17, height 5\' 3"  (1.6 m), weight 70.4 kg, last menstrual period 01/16/2016, SpO2 98 %.   Constitutional: No distress . Vital signs reviewed.  HEENT: NCAT, EOMI, oral membranes moist Neck: supple Cardiovascular: mild tachycardia without murmur. No JVD    Respiratory/Chest: CTAB. +breathy vocal quality - improved. +Reduced breath sounds in LLL - unchanged. Mild wet cough without sputum.  GI/Abdomen: BS +, non-tender, non-distended Ext: no clubbing, cyanosis, or edema Psych: pleasant and cooperative  Skin: No evidence of breakdown, no evidence of rash. +tracheostomy site closed, open to air.  Neurological: AAOx3. Appropriate.  No apparent deficits. Follows all commands. Musculoskeletal: Moving all 4 limbs 5/5 Plantarflexion of BL feet, can ROM to neutral, unchanged    Assessment/Plan: 1. Functional deficits which require 3+ hours per day of interdisciplinary therapy in a comprehensive inpatient rehab setting. Physiatrist is providing close team supervision and 24 hour management of active medical problems listed  below. Physiatrist and rehab team continue to assess barriers to discharge/monitor patient progress toward functional and medical goals  Care Tool:  Bathing    Body parts bathed by patient: Right arm, Left arm, Chest, Front perineal area, Abdomen, Buttocks, Left upper leg, Right lower leg, Left lower leg, Face, Right upper leg   Body parts bathed by helper: Left lower leg, Right lower leg, Front perineal area, Buttocks, Right arm, Left arm     Bathing assist Assist Level: Supervision/Verbal cueing     Upper Body Dressing/Undressing Upper body dressing   What is the patient wearing?: Pull over shirt    Upper body assist Assist Level: Set up assist    Lower Body Dressing/Undressing Lower body dressing      What is the patient wearing?: Pants, Underwear/pull up     Lower body assist Assist for lower body dressing: Contact Guard/Touching assist     Toileting Toileting    Toileting assist Assist for toileting: Contact Guard/Touching assist     Transfers Chair/bed transfer  Transfers assist     Chair/bed transfer assist level: Contact Guard/Touching assist     Locomotion Ambulation   Ambulation assist      Assist level: Contact Guard/Touching assist Assistive device: Walker-rolling Max distance: 180'   Walk 10 feet activity   Assist  Walk 10 feet activity did not occur: Safety/medical concerns  Assist level:  Contact Guard/Touching assist Assistive device: Walker-rolling   Walk 50 feet activity   Assist Walk 50 feet with 2 turns activity did not occur: Safety/medical concerns  Assist level: Contact Guard/Touching assist Assistive device: Walker-rolling    Walk 150 feet activity   Assist Walk 150 feet activity did not occur: Safety/medical concerns  Assist level: Contact Guard/Touching assist Assistive device: Walker-rolling    Walk 10 feet on uneven surface  activity   Assist Walk 10 feet on uneven surfaces activity did not occur:  Safety/medical concerns   Assist level: Contact Guard/Touching assist Assistive device: Walker-rolling   Wheelchair     Assist Is the patient using a wheelchair?: Yes Type of Wheelchair: Manual    Wheelchair assist level: Independent Max wheelchair distance: 150'    Wheelchair 50 feet with 2 turns activity    Assist        Assist Level: Independent   Wheelchair 150 feet activity     Assist      Assist Level: Independent   Blood pressure 101/68, pulse 84, temperature 98 F (36.7 C), temperature source Oral, resp. rate 17, height 5\' 3"  (1.6 m), weight 70.4 kg, last menstrual period 01/16/2016, SpO2 98 %.  Medical Problem List and Plan: 1. 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  Antithrombotics: -DVT/anticoagulation:  Pharmaceutical: Heparin- will try and change to Lovenox- as long as labs in AM are OK -> HgB stable, Cr WNL, switch to Lovenox inj 40 mg daily              -antiplatelet therapy: N/A 3. Pain Management: Lidoderm patch, Tylenol as needed - stable  - Reports chronic neck pain with Percocet 10 mg at home; was getting Oxy 5 mg Q6H PRN per admission notes; Oxy 2.5 mg Q6H PRN, will wean as tolerated. Cannot prescribe pain medication on discharge d/t documented history of snorting oxycodone; patient aware.   - States she gets gabapentin + Lyrica for peripheral neuropathy at home; not currently bothersome; monitor  1/20- will change bed to air mattress ot help with back pain.   1/22 - reverted back to regular mattress d.t increase back pain  4. Mood/Behavior/Sleep: improved             -antipsychotic agents: Clonazepam 0.5 mg 3 times daily, melatonin 3 mg nightly  1/23: Encourage compliance with timed toiletting due to reported incontinence d/t fatigue, unwilling to get OOB -  resolved  5. Neuropsych/cognition: This patient is capable of making decisions on her own behalf.  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 Skin/Wound Care Stage II L inner buttock pressure ulcer- from [place]; tracheostomy opening - will do foam dressing and monitor clinically - Routine skin checks - tracheostomy site; healed, open to air  7. Fluids/Electrolytes/Nutrition: Routine in and outs with follow-up chemistries - stable - Ca elevated but stable, monitor - Low protein/albumin, added Boost supplement TID for nutrition and wound healing  - Intermittent low K-> normalized at K dur 40 meq BID to 3.8; refused  IV repletion d/t burning, intermittent PO refusal d/t nausea - Na improved from 129->135 with DC IV fluids. Encourage improved POs  8.  Respiratory failure/tracheostomy.  Decannulated 1 week PTA- no air leak.  Check oxygen saturations every shift- off O2-stable respiratory status - Admission: Coarse cough with light green sputum- will check f/u CXR  - 1/11 CXR: Multifocal airspace consolidation throughout the left lung appears stable. R lung clear. Vitals stable on RA. Continue current management.    1/14- WBC up to 17- per #15  1/14: WBC downtrending; likely source UTI  1/17: Afebrile, vitals stable, WBC continues downtrend on UTI Tx. Mild sputum production, complex pathology on CT abdomen but appears stable from prior images. Low suspicion for recurrent pneumonia, monitor closely  1/23: Duonebs Q8h PRN for SOB added. CXR today for subjective SOB; given improved WBC stable at 10-12 low suspicion for recurrent PNA  1/24: IR to evalaute for potential effusion tap, given re-loculated on CXR -> no tap recommended at this time; monitor and continue duonebs for symptomatic SOB  1/26-27: duonebs,  albuterol nebs, and flutter valve for SOB and productive cough  1/29: Improving tolerance overall; will need pulm f/u on DC for loculated effusion, given no intervention   9.  AKI.  Resolved. creatinine 0.4-0.7.  Follow-up chemistries  - Intermittent IVF for elevations with nausea/vomitting, stable now on PO diet  10.  History of tobacco polysubstance use.  Provide counseling  - Patient Hx snorting her prescribed oxycodone; understands no narcotic scripts from hospital at discharge  11.  Prediabetes.  Hemoglobin A1c 6.1.  Blood sugar checks discontinued  12.  Dysphagia.  Dysphagia #2 thin liquids.  Follow-up speech therapy - stable  1/16: NPO except sips with meds and ice chips d/t concern for colitis, awaiting GI eval  1/17: Clear liquid diet today, NPO at midnight for scope.   1/18: Cleared by GI to resume diet  - Dys 3 with thins 1/19 - adv to regular Diet Orders (From admission, onward)     Start     Ordered   02/25/22 0854  Diet regular Room service appropriate? Yes; Fluid consistency: Thin  Diet effective now       Question Answer Comment  Room service appropriate? Yes   Fluid consistency: Thin      02/25/22 0853             13. Constipation- improved, intermittent  1/14- had moderate stool burden- given SMOG enema last night- pt having bright red clots since then  1/18: BID miralax per GI; had multiple enemas for bowel prep  1/22: Amitiza per home regimen, suppository PRN pending KUB showed scattered stool, no acute findings. Resume miralax BID when tolerating POs.   1/23: Encouraging POS, changed Miralax to Colace 100 mg BID, also drinking prune juice. Did have small BM with suppository overnight.  1/25: added Senna 7.6 mg 1 tab QHS d/t poor miralax tolerance.  1/29: BM per patient 1/28; check UA d/t midline suprapubic pain, nausea, and dysuria - negative 1/30: KUB today for worsening nausea. Poor POs, LBM per nursing 1/26. Added Boost per patient for mealtime POs.   1/31: KUB WNL. 2x BM yesterday. Nausea improved.   14. BRBPR w/ anemia, colitis - resolved  1/17: GI consulted, CT abdomen overnight with results showing ?rectosigmoid colitis. Unlikely infectious. Cleared for clear liquid diet today, patient to have colonoscopy vs. Flex sigmoidoscopy in AM; +/- EGD depending on iron panel.   1/18: See procedure note. Nonspecific colitis observed  on flex sig. Biopsies pending. Per GI, cleared for PO diet with gentle miralax 17 g BID bowel regimen. Iron panel with low TIBC, high ferritin = likely anemia chronic disease, low suspicion for acute bleed.    15. Leukocytosis - Uti +/- colitis - resolved  1/14- WBC up to 17k from 11k- UTI started Keflex 500 mg q8 hours and since not voiding a lot, will start NS IVFs 75cc hour for 1 day -> switched to Rocephin 1 G daily d/t poor PO tolerance, plan to switch back to PO once improved  1/19: Switched to ampicillin 500 mg TID PO for 3 days; WBC WNL - finished regimen  1/29 labs stable, UA as above stable  17. Tachycardia - mild , asymptomatic - stable Vitals:   02/27/22 1954 02/28/22 0345  BP: 112/61 101/68  Pulse: 88 84  Resp: 18 17  Temp: 98 F (36.7 C) 98 F (36.7 C)  SpO2: 95% 98%      LOS: 22 days A FACE TO FACE EVALUATION WAS PERFORMED  Gertie Gowda 02/28/2022, 1:43 PM

## 2022-03-03 ENCOUNTER — Telehealth: Payer: Self-pay

## 2022-03-03 NOTE — Telephone Encounter (Signed)
SW received update from [employer] coordinator reporting pt husband Gerald Stabs called and stated there was an issue with scheduling outpatient therapies.   SW followed up with [place] location(p:[phone]/[phone])  to discuss referral and confirmed received last week. Reports she has referral. SW reiterated to contact pt husband Gerald Stabs for follow-up.   SW spoke with pt husband Gerald Stabs to inform on above, and he is aware outpatient clinic will follow-up.   Case closed to SW.   Loralee Pacas, MSW, [place] Office: [phone] Cell: [phone] Fax: [phone]

## 2022-03-04 ENCOUNTER — Telehealth: Payer: Self-pay | Admitting: Registered Nurse

## 2022-03-04 NOTE — Telephone Encounter (Signed)
Transition Care Management Unsuccessful Follow-up Telephone Call  Date of discharge and from where:  02/28/2022 Inpatient Rehabilitation   Attempts:  1st Attempt  Reason for unsuccessful TCM follow-up call:  Left voice message    

## 2022-03-10 ENCOUNTER — Telehealth: Payer: Self-pay | Admitting: Physical Medicine and Rehabilitation

## 2022-03-10 ENCOUNTER — Telehealth: Payer: Self-pay
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
## 2022-03-10 NOTE — Telephone Encounter (Signed)
Returning call to our office

## 2022-03-10 NOTE — Telephone Encounter (Signed)
Experiencing symptoms: throwing up with some meds and foods, breathing change,

## 2022-03-11 ENCOUNTER — Telehealth: Payer: Self-pay

## 2022-03-12 ENCOUNTER — Ambulatory Visit
Admission: RE | Admit: 2022-03-12 | Discharge: 2022-03-12 | Disposition: A | Discharge status: Home / Self Care | Payer: Disability Insurance | Source: Ambulatory Visit | Attending: Physical Medicine and Rehabilitation | Admitting: Physical Medicine and Rehabilitation

## 2022-03-12 ENCOUNTER — Encounter: Payer: 59 | Attending: Physical Medicine and Rehabilitation | Admitting: Physical Medicine and Rehabilitation

## 2022-03-12 ENCOUNTER — Encounter: Payer: Self-pay | Admitting: Physical Medicine and Rehabilitation

## 2022-03-12 VITALS — BP 103/69 | HR 96 | Ht 63.0 in

## 2022-03-12 DIAGNOSIS — R0602 Shortness of breath: Secondary | ICD-10-CM | POA: Insufficient documentation

## 2022-03-12 DIAGNOSIS — R112 Nausea with vomiting, unspecified: Secondary | ICD-10-CM | POA: Insufficient documentation

## 2022-03-12 DIAGNOSIS — G894 Chronic pain syndrome: Secondary | ICD-10-CM | POA: Insufficient documentation

## 2022-03-12 DIAGNOSIS — G7281 Critical illness myopathy: Secondary | ICD-10-CM | POA: Diagnosis not present

## 2022-03-12 MED ORDER — ALUM & MAG HYDROXIDE-SIMETH 400-400-40 MG/5ML PO SUSP: 5.0000 mL | Freq: Three times a day (TID) | ORAL | 0 refills | Status: DC

## 2022-03-12 NOTE — Patient Instructions (Addendum)
Take maalox before meals to help with nausea and vomitting. Call your GI office to ask if any recommendations ahead of your next visit. I'll also send my visit note to their office.   I have ordered a chest xray for your cough; I will call you with results.  Start home health PT and follow up with me in 2 months.  Send me the fax for your court letter. I can also send you a copy through mychart.

## 2022-03-12 NOTE — Telephone Encounter (Signed)
SW faxed referral to [facility] (p:[phone]/f:[phone])  and [facility] at [facility] [phone]. SW received phone call from [place] reporting they accept the insurance but are closed to this area. SW spoke with Administrator  with [facility] reporting that referral was accepted but pt will be require to agree to 50% co-pay per discipline which could range from $150-250. SW shared will discuss with family and follow-up if needed.  1331-SW left message for pt husband Gerald Stabs informing on above to inquire if they would be willing to make co-pay, otherwise outpatient would be recommended to remain in place. SW waiting on follow-up.  SW will wait for follow-up and close case after one week.   Loralee Pacas, MSW, [place] Office: [phone] Cell: [phone] Fax: [phone]
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
## 2022-03-13 ENCOUNTER — Telehealth: Payer: Self-pay

## 2022-03-13 NOTE — Telephone Encounter (Signed)
Letter written and sent to patient, will fax as instructed.

## 2022-03-13 NOTE — Telephone Encounter (Signed)
Janet Hensley was seen in the office yesterday. She stated you were going to create a letter to to the court of [place]. If so, the Fax number is [phone].   She also state the Xray was completed on yesterday.

## 2022-03-14 NOTE — Telephone Encounter (Signed)
Letter faxed today 03/14/2022. Spouse informed.

## 2022-03-15 DIAGNOSIS — R0602 Shortness of breath: Secondary | ICD-10-CM | POA: Insufficient documentation

## 2022-03-15 NOTE — Assessment & Plan Note (Signed)
Take maalox before meals to help with nausea and vomitting. Call your GI office to ask if any recommendations ahead of your next visit. I'll also send my visit note to their office.

## 2022-03-15 NOTE — Assessment & Plan Note (Signed)
Start home health PT and follow up with me in 2 months.  Send me the fax for your court letter. I can also send you a copy through mychart.

## 2022-03-15 NOTE — Assessment & Plan Note (Signed)
  I have ordered a chest xray for your cough; I will call you with results.

## 2022-03-15 NOTE — Assessment & Plan Note (Signed)
Reiterated she will not be getting pain medication through our office; patient understanding

## 2022-03-18 NOTE — Telephone Encounter (Signed)
Patient called in stating that the courthouse never received the fax, is it possible the letter can be refaxed

## 2022-03-22 ENCOUNTER — Emergency Department (HOSPITAL_COMMUNITY): Payer: 59

## 2022-03-22 ENCOUNTER — Encounter (HOSPITAL_COMMUNITY): Payer: Self-pay | Admitting: Emergency Medicine

## 2022-03-22 ENCOUNTER — Other Ambulatory Visit: Payer: Self-pay

## 2022-03-22 ENCOUNTER — Inpatient Hospital Stay (HOSPITAL_COMMUNITY)
Admission: EM | Admit: 2022-03-22 | Discharge: 2022-03-26 | DRG: 176 | Disposition: A | Discharge status: Home / Self Care | Payer: 59 | Attending: Internal Medicine | Admitting: Internal Medicine

## 2022-03-22 DIAGNOSIS — R0602 Shortness of breath: Secondary | ICD-10-CM | POA: Diagnosis not present

## 2022-03-22 DIAGNOSIS — R1084 Generalized abdominal pain: Secondary | ICD-10-CM

## 2022-03-22 DIAGNOSIS — I82452 Acute embolism and thrombosis of left peroneal vein: Secondary | ICD-10-CM | POA: Diagnosis present

## 2022-03-22 DIAGNOSIS — R911 Solitary pulmonary nodule: Secondary | ICD-10-CM | POA: Diagnosis present

## 2022-03-22 DIAGNOSIS — F419 Anxiety disorder, unspecified: Secondary | ICD-10-CM | POA: Diagnosis present

## 2022-03-22 DIAGNOSIS — I2699 Other pulmonary embolism without acute cor pulmonale: Secondary | ICD-10-CM | POA: Diagnosis not present

## 2022-03-22 DIAGNOSIS — I82442 Acute embolism and thrombosis of left tibial vein: Secondary | ICD-10-CM | POA: Diagnosis present

## 2022-03-22 DIAGNOSIS — J45909 Unspecified asthma, uncomplicated: Secondary | ICD-10-CM | POA: Insufficient documentation

## 2022-03-22 DIAGNOSIS — F17210 Nicotine dependence, cigarettes, uncomplicated: Secondary | ICD-10-CM | POA: Diagnosis present

## 2022-03-22 DIAGNOSIS — Z8719 Personal history of other diseases of the digestive system: Secondary | ICD-10-CM

## 2022-03-22 DIAGNOSIS — R0789 Other chest pain: Secondary | ICD-10-CM | POA: Diagnosis not present

## 2022-03-22 DIAGNOSIS — I2694 Multiple subsegmental pulmonary emboli without acute cor pulmonale: Secondary | ICD-10-CM | POA: Diagnosis not present

## 2022-03-22 DIAGNOSIS — K219 Gastro-esophageal reflux disease without esophagitis: Secondary | ICD-10-CM | POA: Diagnosis present

## 2022-03-22 DIAGNOSIS — Z79899 Other long term (current) drug therapy: Secondary | ICD-10-CM

## 2022-03-22 DIAGNOSIS — R000 Tachycardia, unspecified: Secondary | ICD-10-CM | POA: Diagnosis not present

## 2022-03-22 DIAGNOSIS — R079 Chest pain, unspecified: Secondary | ICD-10-CM | POA: Diagnosis not present

## 2022-03-22 DIAGNOSIS — I9589 Other hypotension: Secondary | ICD-10-CM | POA: Diagnosis present

## 2022-03-22 DIAGNOSIS — J4489 Other specified chronic obstructive pulmonary disease: Secondary | ICD-10-CM | POA: Diagnosis present

## 2022-03-22 DIAGNOSIS — Z720 Tobacco use: Secondary | ICD-10-CM | POA: Diagnosis present

## 2022-03-22 LAB — CBC WITH DIFFERENTIAL/PLATELET
Abs Immature Granulocytes: 0.03 10*3/uL (ref 0.00–0.07)
Basophils Absolute: 0 10*3/uL (ref 0.0–0.1)
Basophils Relative: 0 %
Eosinophils Absolute: 0.2 10*3/uL (ref 0.0–0.5)
Eosinophils Relative: 2 %
HCT: 36.9 % (ref 36.0–46.0)
Hemoglobin: 11.3 g/dL — ABNORMAL LOW (ref 12.0–15.0)
Immature Granulocytes: 0 %
Lymphocytes Relative: 29 %
Lymphs Abs: 3.2 10*3/uL (ref 0.7–4.0)
MCH: 30.3 pg (ref 26.0–34.0)
MCHC: 30.6 g/dL (ref 30.0–36.0)
MCV: 98.9 fL (ref 80.0–100.0)
Monocytes Absolute: 0.8 10*3/uL (ref 0.1–1.0)
Monocytes Relative: 8 %
Neutro Abs: 6.7 10*3/uL (ref 1.7–7.7)
Neutrophils Relative %: 61 %
Platelets: 431 10*3/uL — ABNORMAL HIGH (ref 150–400)
RBC: 3.73 MIL/uL — ABNORMAL LOW (ref 3.87–5.11)
RDW: 14.7 % (ref 11.5–15.5)
WBC: 11 10*3/uL — ABNORMAL HIGH (ref 4.0–10.5)
nRBC: 0 % (ref 0.0–0.2)

## 2022-03-22 LAB — BRAIN NATRIURETIC PEPTIDE: B Natriuretic Peptide: 58 pg/mL (ref 0.0–100.0)

## 2022-03-22 LAB — COMPREHENSIVE METABOLIC PANEL
ALT: 23 U/L (ref 0–44)
AST: 30 U/L (ref 15–41)
Albumin: 1.8 g/dL — ABNORMAL LOW (ref 3.5–5.0)
Alkaline Phosphatase: 189 U/L — ABNORMAL HIGH (ref 38–126)
Anion gap: 10 (ref 5–15)
BUN: 8 mg/dL (ref 6–20)
CO2: 23 mmol/L (ref 22–32)
Calcium: 8.3 mg/dL — ABNORMAL LOW (ref 8.9–10.3)
Chloride: 105 mmol/L (ref 98–111)
Creatinine, Ser: 0.91 mg/dL (ref 0.44–1.00)
GFR, Estimated: >60 mL/min (ref >60)
Glucose, Bld: 159 mg/dL — ABNORMAL HIGH (ref 70–99)
Potassium: 3.7 mmol/L (ref 3.5–5.1)
Sodium: 138 mmol/L (ref 135–145)
Total Bilirubin: 0.4 mg/dL (ref 0.3–1.2)
Total Protein: 6.4 g/dL — ABNORMAL LOW (ref 6.5–8.1)

## 2022-03-22 LAB — TROPONIN I (HIGH SENSITIVITY)
Troponin I (High Sensitivity): 7 ng/L (ref <18)
Troponin I (High Sensitivity): 6 ng/L (ref <18)

## 2022-03-22 MED ORDER — NICOTINE 14 MG/24HR TD PT24
14.0000 mg | MEDICATED_PATCH | Freq: Every day | TRANSDERMAL | Status: DC
  Administered 2022-03-23 – 2022-03-26 (×5): 14 mg via TRANSDERMAL
  Filled 2022-03-22 (×6): qty 1

## 2022-03-22 MED ORDER — ACETAMINOPHEN 650 MG RE SUPP: 650.0000 mg | Freq: Four times a day (QID) | RECTAL | Status: DC | PRN

## 2022-03-22 MED ORDER — LEVALBUTEROL HCL 0.63 MG/3ML IN NEBU: 0.6300 mg | INHALATION_SOLUTION | Freq: Three times a day (TID) | RESPIRATORY_TRACT | Status: DC | PRN

## 2022-03-22 MED ORDER — ACETAMINOPHEN 325 MG PO TABS: 650.0000 mg | ORAL_TABLET | Freq: Four times a day (QID) | ORAL | Status: DC | PRN

## 2022-03-22 MED ORDER — IOHEXOL 350 MG/ML SOLN
68.0000 mL | Freq: Once | INTRAVENOUS | Status: AC | PRN
  Administered 2022-03-22: 68 mL via INTRAVENOUS

## 2022-03-22 MED ORDER — HEPARIN (PORCINE) 25000 UT/250ML-% IV SOLN
1100.0000 [IU]/h | INTRAVENOUS | Status: AC
  Administered 2022-03-22 – 2022-03-23 (×2): 1150 [IU]/h via INTRAVENOUS
  Filled 2022-03-22 (×4): qty 250

## 2022-03-22 MED ORDER — HEPARIN BOLUS VIA INFUSION
4000.0000 [IU] | Freq: Once | INTRAVENOUS | Status: AC
  Administered 2022-03-22: 4000 [IU] via INTRAVENOUS
  Filled 2022-03-22: qty 4000

## 2022-03-22 NOTE — Progress Notes (Signed)
ANTICOAGULATION CONSULT NOTE - Initial Consult  Pharmacy Consult for heparin Indication: pulmonary embolus  No Known Allergies  Patient Measurements: Height: '5\' 3"'$  (160 cm) Weight: 72.6 kg (160 lb) IBW/kg (Calculated) : 52.4 Heparin Dosing Weight: 68kg  Vital Signs: Temp: 98.4 F (36.9 C) (02/24 1813) Temp Source: Oral (02/24 1813) BP: 123/74 (02/24 1945) Pulse Rate: 122 (02/24 1945)  Labs: Recent Labs    03/22/22 1837  HGB 11.3*  HCT 36.9  PLT 431*  CREATININE 0.91  TROPONINIHS 7    Estimated Creatinine Clearance: 69.9 mL/min (by C-G formula based on SCr of 0.91 mg/dL).   Medical History: Past Medical History:  Diagnosis Date   Asthma 01/15/2012    Assessment: 56 YOF presenting with SOB and CP, CT angio chest shows PE, no evidence of RHS.  She is not on anticoagulation PTA, chronic anemia stable.    Goal of Therapy:  Heparin level 0.3-0.7 units/ml Monitor platelets by anticoagulation protocol: Yes   Plan:  Heparin 4000 units IV x 1, and gtt at 1150 units/hr F/u heparin level with AM labs F/u long term [facility] plan  Bertis Ruddy, PharmD, [place] Pharmacist ED Pharmacist Phone # [phone] 03/22/2022 9:11 PM

## 2022-03-22 NOTE — ED Notes (Signed)
The pt is waiting on the results of the c-t scan

## 2022-03-22 NOTE — ED Triage Notes (Signed)
Pt c/o non radiating right sided chest pain described as tightness since this morning. Endorses shortness of breath. Pt states it feels like she has congestion in chest that she cannot get up. Recently hospitalized for 3 months for pneumonia - d/c 1/11.

## 2022-03-22 NOTE — ED Provider Triage Note (Signed)
Emergency Medicine Provider Triage Evaluation Note  Janet Hensley , a 52 y.o. female  was evaluated in triage.  Pt complains of shortness of breath and chest pain.  Symptoms began yesterday patient stated when she takes a deep breath she has substernal chest pain and finds it difficult to take a deep breath.  Patient was recently seen last month in the hospital and also endorsed bilateral leg swelling that began today.  Patient states she has been nauseous with emesis since being discharged last month but is not nauseous at the moment.  Patient denied syncope, hemoptysis, loss of sensation/motor skills, blurry vision, fevers  Review of Systems  Positive: See HPI Negative: See HPI  Physical Exam  BP 129/79 (BP Location: Right Arm)   Pulse (!) 126   Temp 98.4 F (36.9 C) (Oral)   Resp 18   Ht '5\' 3"'$  (1.6 m)   Wt 72.6 kg   LMP 01/16/2016   SpO2 100%   BMI 28.34 kg/m  Gen:   Awake, no distress   Resp:  Increased work of breath, no signs of respiratory distress MSK:   Moves extremities without difficulty  Other:  Lungs clear to auscultation bilaterally, heart was tachycardic to auscultation, 2+ bilateral radial pulses, 5 out of 5 bilateral grip strength, sensation intact in both hands  Medical Decision Making  Medically screening exam initiated at 6:31 PM.  Appropriate orders placed.  Jaymes C Dolley was informed that the remainder of the evaluation will be completed by another provider, this initial triage assessment does not replace that evaluation, and the importance of remaining in the ED until their evaluation is complete.  Workup initiated, patient stable at this time   Elvina Sidle 03/22/22 1842

## 2022-03-22 NOTE — H&P (Signed)
History and Physical    Janet Hensley [id number] DOB: 09-Dec-1970 DOA: 03/22/2022  PCP: Default, Provider, MD  Patient coming from: Home  Chief Complaint: Chest pain  HPI: Janet Hensley is a 52 y.o. female with medical history significant of asthma, anxiety, GERD, substance abuse.  She was admitted in December 2023 for severe sepsis, pneumonia, ARDS requiring intubation, blood culture positive for Rothia Mciliaginosa, urine culture positive for Legionella, bronchoscopy positive for Pseudomonas and stenotrophomonas.  Transferred from [facility] to [facility] for ECMO.  Unable to wean off vent and required tracheostomy.  [facility]  [facility] and discharged home at the beginning of February.  She presents to the ED today with right-sided chest pain and shortness of breath.  In the ED, patient noted be tachycardic to the 120s.  Afebrile.  Not tachypneic, hypoxic, or hypotensive.  Labs significant for WBC 11.0, hemoglobin 11.3 (stable), platelet count 431k, glucose 159, calcium 8.3, albumin 1.8, alk phos 189, transaminases and T. bili normal, troponin normal, BNP normal.  EKG without acute ischemic changes.  CTA chest showing: "IMPRESSION: 1. Acute pulmonary emboli within the RIGHT UPPER and LOWER lobes. No CT evidence of acute heart strain. 2. Scattered consolidation within the LEFT UPPER and LOWER lobes have significantly improved, with increasing cavitary component within the LEFT UPPER lobe. 3. Mild ground-glass opacities within the RIGHT UPPER and LOWER lobes likely represents changes from pulmonary emboli."  Patient was started on IV heparin.  TRH called to admit.  Patient states she went home from the hospital 3 weeks ago and since then has not been very active.  A few days ago she started noticing that her left calf was painful whenever she tried to walk.  Today she noticed swelling of both of  her legs and also started having right-sided pressure-like chest pain and shortness of breath.  She denies fevers or cough.  Denies personal history of prior blood clots.  Denies recent travel.  Denies family history of blood clots.  She has been smoking 1/2 pack of cigarettes daily for the past 30 years.  No other complaints.  Review of Systems:  Review of Systems  All other systems reviewed and are negative.   Past Medical History:  Diagnosis Date   Asthma 01/15/2012    Past Surgical History:  Procedure Laterality Date   BIOPSY  02/13/2022   Procedure: BIOPSY;  Surgeon: Yetta Flock, MD;  Location: [place];  Service: Gastroenterology;;   Otho Darner SIGMOIDOSCOPY N/A 02/13/2022   Procedure: FLEXIBLE SIGMOIDOSCOPY;  Surgeon: Yetta Flock, MD;  Location: [place];  Service: Gastroenterology;  Laterality: N/A;   LEFT HEART CATH AND CORONARY ANGIOGRAPHY N/A 12/24/2021   Procedure: LEFT HEART CATH AND CORONARY ANGIOGRAPHY;  Surgeon: Nelva Bush, MD;  Location: [place] [facility];  Service: Cardiovascular;  Laterality: N/A;     reports that she has been smoking cigarettes. She does not have any smokeless tobacco history on file. No history on file for alcohol use and drug use.  No Known Allergies  History reviewed. No pertinent family history.  Prior to Admission medications   Medication Sig Start Date End Date Taking? Authorizing Provider  acetaminophen (TYLENOL) 160 MG/5ML solution Take 20.3 mLs (650 mg total) by mouth every 6 (six) hours. 01/08/22   Mick Sell, PA-C  alum & mag hydroxide-simeth (MAALOX PLUS) 400-400-40 MG/5ML suspension Take 5 mLs by mouth 3 (three) times daily before meals. 03/12/22  Durel Salts C, DO  clonazePAM (KLONOPIN) 0.5 MG tablet Take 1 tablet (0.5 mg total) by mouth 3 (three) times daily. 02/27/22   Love, Ivan Anchors, PA-C  docusate sodium (COLACE) 100 MG capsule Take 1 capsule (100 mg total) by mouth 2 (two) times daily. 02/27/22    Love, Ivan Anchors, PA-C  famotidine (PEPCID) 20 MG tablet Place 1 tablet (20 mg total) into feeding tube 2 (two) times daily. 02/27/22   Love, Ivan Anchors, PA-C  ipratropium-albuterol (DUONEB) 0.5-2.5 (3) MG/3ML SOLN Take 3 mLs by nebulization every 4 (four) hours as needed. 01/08/22   Mick Sell, PA-C  lubiprostone (AMITIZA) 24 MCG capsule Take 1 capsule (24 mcg total) by mouth 2 (two) times daily with a meal. 02/27/22   Love, Ivan Anchors, PA-C  melatonin 3 MG TABS tablet Take 1 tablet (3 mg total) by mouth at bedtime. 02/27/22   Love, Ivan Anchors, PA-C  midodrine (PROAMATINE) 10 MG tablet Place 1 tablet (10 mg total) into feeding tube 3 (three) times daily. 02/27/22   Love, Ivan Anchors, PA-C  polyethylene glycol powder (GLYCOLAX/MIRALAX) 17 GM/SCOOP powder Take 1 capful with water (17 g) by mouth daily. 02/28/22   Love, Ivan Anchors, PA-C  potassium chloride SA (KLOR-CON M) 20 MEQ tablet Take 2 tablets (40 mEq total) by mouth 2 (two) times daily. 02/27/22   Love, Ivan Anchors, PA-C  senna (SENOKOT) 8.6 MG TABS tablet Take 1 tablet (8.6 mg total) by mouth at bedtime. 02/27/22   Bary Leriche, PA-C    Physical Exam: Vitals:   03/22/22 1813 03/22/22 1821 03/22/22 1945  BP: 129/79  123/74  Pulse: (!) 126  (!) 122  Resp: 18  13  Temp: 98.4 F (36.9 C)    TempSrc: Oral    SpO2: 100%  100%  Weight:  72.6 kg   Height:  '5\' 3"'$  (1.6 m)     Physical Exam Vitals reviewed.  Constitutional:      General: She is not in acute distress. HENT:     Head: Normocephalic and atraumatic.  Eyes:     Extraocular Movements: Extraocular movements intact.  Cardiovascular:     Rate and Rhythm: Regular rhythm. Tachycardia present.     Pulses: Normal pulses.  Pulmonary:     Effort: Pulmonary effort is normal. No respiratory distress.     Breath sounds: Normal breath sounds. No wheezing or rales.  Abdominal:     General: Bowel sounds are normal. There is no distension.     Palpations: Abdomen is soft.     Tenderness: There is no  abdominal tenderness.  Musculoskeletal:     Cervical back: Normal range of motion.     Right lower leg: Edema present.     Left lower leg: Edema present.  Skin:    General: Skin is warm and dry.  Neurological:     General: No focal deficit present.     Mental Status: She is alert and oriented to person, place, and time.     Labs on Admission: I have personally reviewed following labs and imaging studies  CBC: Recent Labs  Lab 03/22/22 1837  WBC 11.0*  NEUTROABS 6.7  HGB 11.3*  HCT 36.9  MCV 98.9  PLT 99991111*   Basic Metabolic Panel: Recent Labs  Lab 03/22/22 1837  NA 138  K 3.7  CL 105  CO2 23  GLUCOSE 159*  BUN 8  CREATININE 0.91  CALCIUM 8.3*   GFR: Estimated Creatinine Clearance: 69.9 mL/min (by  C-G formula based on SCr of 0.91 mg/dL). Liver Function Tests: Recent Labs  Lab 03/22/22 1837  AST 30  ALT 23  ALKPHOS 189*  BILITOT 0.4  PROT 6.4*  ALBUMIN 1.8*   No results for input(s): "LIPASE", "AMYLASE" in the last 168 hours. No results for input(s): "AMMONIA" in the last 168 hours. Coagulation Profile: No results for input(s): "INR", "PROTIME" in the last 168 hours. Cardiac Enzymes: No results for input(s): "CKTOTAL", "CKMB", "CKMBINDEX", "TROPONINI" in the last 168 hours. BNP (last 3 results) No results for input(s): "PROBNP" in the last 8760 hours. HbA1C: No results for input(s): "HGBA1C" in the last 72 hours. CBG: No results for input(s): "GLUCAP" in the last 168 hours. Lipid Profile: No results for input(s): "CHOL", "HDL", "LDLCALC", "TRIG", "CHOLHDL", "LDLDIRECT" in the last 72 hours. Thyroid Function Tests: No results for input(s): "TSH", "T4TOTAL", "FREET4", "T3FREE", "THYROIDAB" in the last 72 hours. Anemia Panel: No results for input(s): "VITAMINB12", "FOLATE", "FERRITIN", "TIBC", "IRON", "RETICCTPCT" in the last 72 hours. Urine analysis:    Component Value Date/Time   COLORURINE YELLOW 02/24/2022 1455   APPEARANCEUR CLEAR 02/24/2022  1455   APPEARANCEUR Clear 02/16/2013 1755   LABSPEC 1.004 (L) 02/24/2022 1455   LABSPEC 1.023 02/16/2013 1755   PHURINE 5.0 02/24/2022 1455   GLUCOSEU NEGATIVE 02/24/2022 1455   GLUCOSEU Negative 02/16/2013 1755   HGBUR NEGATIVE 02/24/2022 1455   BILIRUBINUR NEGATIVE 02/24/2022 1455   BILIRUBINUR Negative 02/16/2013 1755   KETONESUR NEGATIVE 02/24/2022 1455   PROTEINUR NEGATIVE 02/24/2022 1455   NITRITE NEGATIVE 02/24/2022 1455   LEUKOCYTESUR NEGATIVE 02/24/2022 1455   LEUKOCYTESUR Negative 02/16/2013 1755    Radiological Exams on Admission: CT Angio Chest PE W/Cm &/Or Wo Cm  Result Date: 03/22/2022 CLINICAL DATA:  52 year old female with shortness of breath and chest pain. EXAM: CT ANGIOGRAPHY CHEST WITH CONTRAST TECHNIQUE: Multidetector CT imaging of the chest was performed using the standard protocol during bolus administration of intravenous contrast. Multiplanar CT image reconstructions and MIPs were obtained to evaluate the vascular anatomy. RADIATION DOSE REDUCTION: This exam was performed according to the departmental dose-optimization program which includes automated exposure control, adjustment of the mA and/or kV according to patient size and/or use of iterative reconstruction technique. CONTRAST:  68 cc intravenous Omnipaque 350 COMPARISON:  01/11/2022 and prior CTs. 03/22/2022 and prior radiographs FINDINGS: Cardiovascular: Segmental and larger vessel pulmonary emboli are noted within the RIGHT UPPER lobe. RIGHT LOWER lobe segmental pulmonary emboli are also noted. There is no CT evidence of acute heart strain. There is no evidence of thoracic aortic aneurysm or pericardial effusion. Heart size is within normal limits. Mediastinum/Nodes: No enlarged mediastinal, hilar, or axillary lymph nodes. Thyroid gland, trachea, and esophagus demonstrate no significant findings. Lungs/Pleura: Scattered consolidation within the LEFT UPPER and LOWER lobes have significantly improved, with  increasing cavitary component within the LEFT UPPER lobe. Mild ground-glass opacities within the RIGHT UPPER and LOWER lobes likely represents changes from pulmonary emboli. There is no evidence of pneumothorax or pleural effusion. Upper Abdomen: No acute abnormality. Musculoskeletal: No acute or suspicious bony abnormalities are noted. Review of the MIP images confirms the above findings. IMPRESSION: 1. Acute pulmonary emboli within the RIGHT UPPER and LOWER lobes. No CT evidence of acute heart strain. 2. Scattered consolidation within the LEFT UPPER and LOWER lobes have significantly improved, with increasing cavitary component within the LEFT UPPER lobe. 3. Mild ground-glass opacities within the RIGHT UPPER and LOWER lobes likely represents changes from pulmonary emboli. Critical Value/emergent results were  called by telephone at the time of interpretation on 03/22/2022 at 8:55 pm to provider Blanchie Dessert, who verbally acknowledged these results. Electronically Signed   By: Margarette Canada M.D.   On: 03/22/2022 20:57   DG Chest 2 View  Result Date: 03/22/2022 CLINICAL DATA:  Chest pain and shortness of breath. EXAM: CHEST - 2 VIEW COMPARISON:  03/12/2022 chest radiograph and prior studies FINDINGS: The visualized cardiomediastinal silhouette is unchanged. Patchy and linear opacities within the mid-lower LEFT lung, and LEFT apical opacities with probable cavitation are not significantly changed. There is no evidence pneumothorax or large pleural effusion. No acute bony abnormalities are present. IMPRESSION: Unchanged appearance of the chest with LEFT lung opacities and probable LEFT apical cavitation. This may represent continued or residual from recent pneumonia. Radiographic follow-up to resolution is recommended. Electronically Signed   By: Margarette Canada M.D.   On: 03/22/2022 19:22    EKG: Independently reviewed.  Sinus tachycardia, no significant change since prior tracing.  Assessment and Plan  Acute  pulmonary embolism Risk factors include recent prolonged hospitalization/immobility and cigarette smoking.  CTA chest showing acute PE within the right upper and lower lobes and no CT evidence of right heart strain.  Patient is tachycardic to the 110s at this time but not tachypneic, hypoxic, or hypotensive.  Satting 100% on room air.  Initial troponin negative and BNP normal.  Plan: Continue IV heparin.  Bilateral lower extremity Dopplers and echocardiogram ordered.  Repeat troponin pending.  Continuous pulse ox, supplemental oxygen as needed.  Asthma Stable, no signs of acute exacerbation.  Xopenex as needed.  Tobacco use Smokes 1/2 pack of cigarettes daily.  NicoDerm patch ordered and patient has been counseled to quit.  DVT prophylaxis: IV heparin gtt Code Status: Full Code (discussed with the patient) Family Communication: Husband at bedside. Level of care: Telemetry bed Admission status: It is my clinical opinion that referral for OBSERVATION is reasonable and necessary in this patient based on the above information provided. The aforementioned taken together are felt to place the patient at high risk for further clinical deterioration. However, it is anticipated that the patient may be medically stable for discharge from the hospital within 24 to 48 hours.   Shela Leff MD [facility]  If 7PM-7AM, please contact night-coverage [email or web address]  03/22/2022, 9:18 PM

## 2022-03-22 NOTE — ED Notes (Signed)
To ct

## 2022-03-22 NOTE — ED Provider Notes (Signed)
[facility] Provider Note   CSN: [id number] Arrival date & time: 03/22/22  1808     History  Chief Complaint  Patient presents with   Shortness of Breath   Chest Pain    Janet Hensley is a 52 y.o. female.  Pt is a 52y/o female with hx of tobacco use and polysubstance abuse who presented to [facility] for severe sepsis, ARDS, PNA positive for Rothia Mciliaginosa, bronchoscopy positive for Pseudomonas and strenotrophomas requiring intubation and eventually transfer to [facility] but did not require [facility] who [facility] due to myelopathy from critical illness who has been at home since the beginning of feb presenting today due to right sided chest pain.  Patient reports that she woke up this morning with a severe sharp type pain in the right side of her chest that made her feel worried and slightly short of breath.  She has noticed a little bit of wheezing today but reports overall her cough initially had brown sputum and it has improved to a clear sputum.  She is not on any antibiotics at this time and continues to use her inhalers at home.  She has unfortunately started smoking again since moving back home.  She has had not had any fevers, nausea vomiting or diarrhea.  She reports the pain today is something new that she has not had since leaving [facility].  She has not had any recent falls and has not noticed any rashes.  She did have issues because she had a rectal tube for a while and that was removed she had rectal bleeding and some abdominal pain but reports overall all of that is improving and there is not been any new changes with that.  She has been compliant with all of her medications but has also noticed some swelling in her ankles today which is new.  The history is provided by the patient and medical records.  Shortness of Breath Associated symptoms: chest pain   Chest Pain Associated symptoms: shortness of breath         Home Medications Prior to Admission medications   Medication Sig Start Date End Date Taking? Authorizing Provider  acetaminophen (TYLENOL) 160 MG/5ML solution Take 20.3 mLs (650 mg total) by mouth every 6 (six) hours. 01/08/22   Mick Sell, PA-C  alum & mag hydroxide-simeth (MAALOX PLUS) 400-400-40 MG/5ML suspension Take 5 mLs by mouth 3 (three) times daily before meals. 03/12/22   Gertie Gowda, DO  clonazePAM (KLONOPIN) 0.5 MG tablet Take 1 tablet (0.5 mg total) by mouth 3 (three) times daily. 02/27/22   Love, Ivan Anchors, PA-C  docusate sodium (COLACE) 100 MG capsule Take 1 capsule (100 mg total) by mouth 2 (two) times daily. 02/27/22   Love, Ivan Anchors, PA-C  famotidine (PEPCID) 20 MG tablet Place 1 tablet (20 mg total) into feeding tube 2 (two) times daily. 02/27/22   Love, Ivan Anchors, PA-C  ipratropium-albuterol (DUONEB) 0.5-2.5 (3) MG/3ML SOLN Take 3 mLs by nebulization every 4 (four) hours as needed. 01/08/22   Mick Sell, PA-C  lubiprostone (AMITIZA) 24 MCG capsule Take 1 capsule (24 mcg total) by mouth 2 (two) times daily with a meal. 02/27/22   Love, Ivan Anchors, PA-C  melatonin 3 MG TABS tablet Take 1 tablet (3 mg total) by mouth at bedtime. 02/27/22   Love, Ivan Anchors, PA-C  midodrine (PROAMATINE) 10 MG tablet Place 1 tablet (10 mg  total) into feeding tube 3 (three) times daily. 02/27/22   Love, Ivan Anchors, PA-C  polyethylene glycol powder (GLYCOLAX/MIRALAX) 17 GM/SCOOP powder Take 1 capful with water (17 g) by mouth daily. 02/28/22   Love, Ivan Anchors, PA-C  potassium chloride SA (KLOR-CON M) 20 MEQ tablet Take 2 tablets (40 mEq total) by mouth 2 (two) times daily. 02/27/22   Love, Ivan Anchors, PA-C  senna (SENOKOT) 8.6 MG TABS tablet Take 1 tablet (8.6 mg total) by mouth at bedtime. 02/27/22   Bary Leriche, PA-C      Allergies    Patient has no known allergies.    Review of Systems   Review of Systems  Respiratory:  Positive for shortness of breath.   Cardiovascular:  Positive for chest pain.     Physical Exam Updated Vital Signs BP 123/74   Pulse (!) 122   Temp 98.4 F (36.9 C) (Oral)   Resp 13   Ht '5\' 3"'$  (1.6 m)   Wt 72.6 kg   LMP 01/16/2016   SpO2 100%   BMI 28.34 kg/m  Physical Exam Vitals and nursing note reviewed.  Constitutional:      General: She is not in acute distress.    Appearance: She is well-developed.  HENT:     Head: Normocephalic and atraumatic.  Eyes:     Pupils: Pupils are equal, round, and reactive to light.  Neck:     Comments: Well-healed decannulated trach ostomy Cardiovascular:     Rate and Rhythm: Regular rhythm. Tachycardia present.     Heart sounds: Normal heart sounds. No murmur heard.    No friction rub.  Pulmonary:     Effort: Pulmonary effort is normal.     Breath sounds: Examination of the right-lower field reveals decreased breath sounds. Examination of the left-lower field reveals decreased breath sounds. Decreased breath sounds present. No wheezing or rales.     Comments: Able to speak in full sentences Chest:     Chest wall: No tenderness.  Abdominal:     General: Bowel sounds are normal. There is no distension.     Palpations: Abdomen is soft.     Tenderness: There is no abdominal tenderness. There is no guarding or rebound.  Musculoskeletal:        General: No tenderness. Normal range of motion.     Cervical back: Normal range of motion and neck supple.     Right lower leg: Edema present.     Left lower leg: Edema present.     Comments: Edema 1+ in bilateral ankles left greater than right  Skin:    General: Skin is warm and dry.     Findings: No rash.  Neurological:     Mental Status: She is alert and oriented to person, place, and time.     Cranial Nerves: No cranial nerve deficit.  Psychiatric:        Mood and Affect: Mood normal.        Behavior: Behavior normal.     ED Results / Procedures / Treatments   Labs (all labs ordered are listed, but only abnormal results are displayed) Labs Reviewed   COMPREHENSIVE METABOLIC PANEL - Abnormal; Notable for the following components:      Result Value   Glucose, Bld 159 (*)    Calcium 8.3 (*)    Total Protein 6.4 (*)    Albumin 1.8 (*)    Alkaline Phosphatase 189 (*)    All other components within normal limits  CBC WITH DIFFERENTIAL/PLATELET - Abnormal; Notable for the following components:   WBC 11.0 (*)    RBC 3.73 (*)    Hemoglobin 11.3 (*)    Platelets 431 (*)    All other components within normal limits  BRAIN NATRIURETIC PEPTIDE  TROPONIN I (HIGH SENSITIVITY)  TROPONIN I (HIGH SENSITIVITY)    EKG None ED ECG REPORT   Date: 03/22/2022  Rate: 122  Rhythm: sinus tachycardia  QRS Axis: normal  Intervals: normal  ST/T Wave abnormalities: normal  Conduction Disutrbances:none  Narrative Interpretation:   Old EKG Reviewed: unchanged  I have personally reviewed the EKG tracing and agree with the computerized printout as noted.   Radiology CT Angio Chest PE W/Cm &/Or Wo Cm  Result Date: 03/22/2022 CLINICAL DATA:  52 year old female with shortness of breath and chest pain. EXAM: CT ANGIOGRAPHY CHEST WITH CONTRAST TECHNIQUE: Multidetector CT imaging of the chest was performed using the standard protocol during bolus administration of intravenous contrast. Multiplanar CT image reconstructions and MIPs were obtained to evaluate the vascular anatomy. RADIATION DOSE REDUCTION: This exam was performed according to the departmental dose-optimization program which includes automated exposure control, adjustment of the mA and/or kV according to patient size and/or use of iterative reconstruction technique. CONTRAST:  68 cc intravenous Omnipaque 350 COMPARISON:  01/11/2022 and prior CTs. 03/22/2022 and prior radiographs FINDINGS: Cardiovascular: Segmental and larger vessel pulmonary emboli are noted within the RIGHT UPPER lobe. RIGHT LOWER lobe segmental pulmonary emboli are also noted. There is no CT evidence of acute heart strain. There  is no evidence of thoracic aortic aneurysm or pericardial effusion. Heart size is within normal limits. Mediastinum/Nodes: No enlarged mediastinal, hilar, or axillary lymph nodes. Thyroid gland, trachea, and esophagus demonstrate no significant findings. Lungs/Pleura: Scattered consolidation within the LEFT UPPER and LOWER lobes have significantly improved, with increasing cavitary component within the LEFT UPPER lobe. Mild ground-glass opacities within the RIGHT UPPER and LOWER lobes likely represents changes from pulmonary emboli. There is no evidence of pneumothorax or pleural effusion. Upper Abdomen: No acute abnormality. Musculoskeletal: No acute or suspicious bony abnormalities are noted. Review of the MIP images confirms the above findings. IMPRESSION: 1. Acute pulmonary emboli within the RIGHT UPPER and LOWER lobes. No CT evidence of acute heart strain. 2. Scattered consolidation within the LEFT UPPER and LOWER lobes have significantly improved, with increasing cavitary component within the LEFT UPPER lobe. 3. Mild ground-glass opacities within the RIGHT UPPER and LOWER lobes likely represents changes from pulmonary emboli. Critical Value/emergent results were called by telephone at the time of interpretation on 03/22/2022 at 8:55 pm to provider Blanchie Dessert, who verbally acknowledged these results. Electronically Signed   By: Margarette Canada M.D.   On: 03/22/2022 20:57   DG Chest 2 View  Result Date: 03/22/2022 CLINICAL DATA:  Chest pain and shortness of breath. EXAM: CHEST - 2 VIEW COMPARISON:  03/12/2022 chest radiograph and prior studies FINDINGS: The visualized cardiomediastinal silhouette is unchanged. Patchy and linear opacities within the mid-lower LEFT lung, and LEFT apical opacities with probable cavitation are not significantly changed. There is no evidence pneumothorax or large pleural effusion. No acute bony abnormalities are present. IMPRESSION: Unchanged appearance of the chest with LEFT  lung opacities and probable LEFT apical cavitation. This may represent continued or residual from recent pneumonia. Radiographic follow-up to resolution is recommended. Electronically Signed   By: Margarette Canada M.D.   On: 03/22/2022 19:22    Procedures Procedures    Medications Ordered in ED Medications  iohexol (OMNIPAQUE) 350 MG/ML injection 68 mL (68 mLs Intravenous Contrast Given 03/22/22 2041)    ED Course/ Medical Decision Making/ A&P                             Medical Decision Making Amount and/or Complexity of Data Reviewed External Data Reviewed: notes. Labs: ordered. Decision-making details documented in ED Course. Radiology: ordered and independent interpretation performed. Decision-making details documented in ED Course. ECG/medicine tests: ordered and independent interpretation performed. Decision-making details documented in ED Course.  Risk Decision regarding hospitalization.   Pt with multiple medical problems and comorbidities and presenting today with a complaint that caries a high risk for morbidity and mortality.  Here today with abrupt onset of severe pleuritic type chest pain on the right side and some shortness of breath.  Patient has not had any hemoptysis and reports she has had no productive cough today.  She denies any new acute infectious symptoms.  She has unfortunately started smoking again and is using her inhalers without any improvement today.  She has no prior history of clots and she is not chronically anticoagulated.  She reports currently the pain is better than what it had been.  Concern for PE, pleurisy, new developing pneumonia, pneumothorax.  Patient is not having significant wheezing on exam to suggest COPD exacerbation.  She has no rashes consistent with zoster.  Patient is tachycardic here concern for possible dysrhythmia.  She does not appear dehydrated.  I independently interpreted patient's EKG and labs.  EKG was sinus tachycardia today without  acute findings concerning for ACS or dysrhythmia.  CMP without acute findings, troponin negative, CBC with minimal leukocytosis of 11 and stable hemoglobin of 11, BNP within normal limits. I have independently visualized and interpreted pt's images today. CXR without PTX or new pleural effusions of other acute findings.  9:07 PM  CTA of the chest with evidence of right-sided PEs as well as persistent infiltrate in the left which radiology reports is improved from prior.  SPECT patient most likely has a DVT in the left lower extremity given it is more swollen than the right.  Patient has not been on anticoagulation since [facility] and is not as mobile as she wants was.  Will start patient on heparin and admit for further care.  Findings discussed with the patient and her husband.  She is comfortable with this plan.  Heart rate remains tween 110-120.  She denies any shortness of breath at this time.  CRITICAL CARE Performed by: Dary Dilauro Total critical care time: 30 minutes Critical care time was exclusive of separately billable procedures and treating other patients. Critical care was necessary to treat or prevent imminent or life-threatening deterioration. Critical care was time spent personally by me on the following activities: development of treatment plan with patient and/or surrogate as well as nursing, discussions with consultants, evaluation of patient's response to treatment, examination of patient, obtaining history from patient or surrogate, ordering and performing treatments and interventions, ordering and review of laboratory studies, ordering and review of radiographic studies, pulse oximetry and re-evaluation of patient's condition.         Final Clinical Impression(s) / ED Diagnoses Final diagnoses:  Multiple subsegmental pulmonary emboli without acute cor pulmonale [facility])    Rx / DC Orders ED Discharge Orders     None         Blanchie Dessert, MD 03/22/22 2108

## 2022-03-22 NOTE — ED Notes (Signed)
Admitting doctor at the bedside 

## 2022-03-23 ENCOUNTER — Observation Stay (HOSPITAL_COMMUNITY): Payer: 59

## 2022-03-23 ENCOUNTER — Inpatient Hospital Stay (HOSPITAL_COMMUNITY): Payer: 59

## 2022-03-23 DIAGNOSIS — R079 Chest pain, unspecified: Secondary | ICD-10-CM | POA: Diagnosis not present

## 2022-03-23 DIAGNOSIS — F419 Anxiety disorder, unspecified: Secondary | ICD-10-CM | POA: Diagnosis not present

## 2022-03-23 DIAGNOSIS — K760 Fatty (change of) liver, not elsewhere classified: Secondary | ICD-10-CM | POA: Diagnosis not present

## 2022-03-23 DIAGNOSIS — R1084 Generalized abdominal pain: Secondary | ICD-10-CM | POA: Diagnosis not present

## 2022-03-23 DIAGNOSIS — M79605 Pain in left leg: Secondary | ICD-10-CM

## 2022-03-23 DIAGNOSIS — I82442 Acute embolism and thrombosis of left tibial vein: Secondary | ICD-10-CM | POA: Diagnosis not present

## 2022-03-23 DIAGNOSIS — F17210 Nicotine dependence, cigarettes, uncomplicated: Secondary | ICD-10-CM | POA: Diagnosis not present

## 2022-03-23 DIAGNOSIS — R911 Solitary pulmonary nodule: Secondary | ICD-10-CM | POA: Diagnosis not present

## 2022-03-23 DIAGNOSIS — Z8719 Personal history of other diseases of the digestive system: Secondary | ICD-10-CM

## 2022-03-23 DIAGNOSIS — I9589 Other hypotension: Secondary | ICD-10-CM | POA: Diagnosis not present

## 2022-03-23 DIAGNOSIS — I2699 Other pulmonary embolism without acute cor pulmonale: Secondary | ICD-10-CM | POA: Diagnosis not present

## 2022-03-23 DIAGNOSIS — K219 Gastro-esophageal reflux disease without esophagitis: Secondary | ICD-10-CM | POA: Diagnosis not present

## 2022-03-23 DIAGNOSIS — R0789 Other chest pain: Secondary | ICD-10-CM | POA: Diagnosis not present

## 2022-03-23 DIAGNOSIS — Z79899 Other long term (current) drug therapy: Secondary | ICD-10-CM | POA: Diagnosis not present

## 2022-03-23 DIAGNOSIS — J4489 Other specified chronic obstructive pulmonary disease: Secondary | ICD-10-CM | POA: Diagnosis not present

## 2022-03-23 DIAGNOSIS — I2694 Multiple subsegmental pulmonary emboli without acute cor pulmonale: Secondary | ICD-10-CM

## 2022-03-23 DIAGNOSIS — I82452 Acute embolism and thrombosis of left peroneal vein: Secondary | ICD-10-CM | POA: Diagnosis not present

## 2022-03-23 DIAGNOSIS — N200 Calculus of kidney: Secondary | ICD-10-CM | POA: Diagnosis not present

## 2022-03-23 LAB — CBC
HCT: 32.6 % — ABNORMAL LOW (ref 36.0–46.0)
Hemoglobin: 10.3 g/dL — ABNORMAL LOW (ref 12.0–15.0)
MCH: 30.6 pg (ref 26.0–34.0)
MCHC: 31.6 g/dL (ref 30.0–36.0)
MCV: 96.7 fL (ref 80.0–100.0)
Platelets: 365 10*3/uL (ref 150–400)
RBC: 3.37 MIL/uL — ABNORMAL LOW (ref 3.87–5.11)
RDW: 14.6 % (ref 11.5–15.5)
WBC: 9.3 10*3/uL (ref 4.0–10.5)
nRBC: 0 % (ref 0.0–0.2)

## 2022-03-23 LAB — ECHOCARDIOGRAM COMPLETE
Height: 63 in
S' Lateral: 2.7 cm
Weight: 2560 oz

## 2022-03-23 LAB — LIPASE, BLOOD: Lipase: 30 U/L (ref 11–51)

## 2022-03-23 LAB — HEPARIN LEVEL (UNFRACTIONATED)
Heparin Unfractionated: 0.71 IU/mL — ABNORMAL HIGH (ref 0.30–0.70)
Heparin Unfractionated: 0.46 IU/mL (ref 0.30–0.70)

## 2022-03-23 MED ORDER — IOHEXOL 350 MG/ML SOLN
75.0000 mL | Freq: Once | INTRAVENOUS | Status: AC | PRN
  Administered 2022-03-23: 75 mL via INTRAVENOUS

## 2022-03-23 MED ORDER — OXYCODONE-ACETAMINOPHEN 10-325 MG PO TABS: 1.0000 | ORAL_TABLET | Freq: Four times a day (QID) | ORAL | Status: DC | PRN

## 2022-03-23 MED ORDER — DOCUSATE SODIUM 100 MG PO CAPS
100.0000 mg | ORAL_CAPSULE | Freq: Two times a day (BID) | ORAL | Status: DC
  Administered 2022-03-23 – 2022-03-26 (×7): 100 mg via ORAL
  Filled 2022-03-23 (×7): qty 1

## 2022-03-23 MED ORDER — IPRATROPIUM BROMIDE 0.02 % IN SOLN: 0.5000 mg | Freq: Four times a day (QID) | RESPIRATORY_TRACT | Status: DC | PRN

## 2022-03-23 MED ORDER — IPRATROPIUM-ALBUTEROL 0.5-2.5 (3) MG/3ML IN SOLN: 3.0000 mL | RESPIRATORY_TRACT | Status: DC | PRN

## 2022-03-23 MED ORDER — CLONAZEPAM 0.5 MG PO TABS
0.5000 mg | ORAL_TABLET | Freq: Three times a day (TID) | ORAL | Status: DC
  Administered 2022-03-23 – 2022-03-26 (×9): 0.5 mg via ORAL
  Filled 2022-03-23 (×9): qty 1

## 2022-03-23 MED ORDER — MELATONIN 3 MG PO TABS: 3.0000 mg | ORAL_TABLET | Freq: Every evening | ORAL | Status: DC | PRN

## 2022-03-23 MED ORDER — OXYCODONE HCL 5 MG PO TABS
5.0000 mg | ORAL_TABLET | Freq: Four times a day (QID) | ORAL | Status: DC | PRN
  Administered 2022-03-24 – 2022-03-25 (×4): 5 mg via ORAL
  Filled 2022-03-23 (×4): qty 1

## 2022-03-23 MED ORDER — ONDANSETRON HCL 4 MG/2ML IJ SOLN
4.0000 mg | Freq: Four times a day (QID) | INTRAMUSCULAR | Status: DC | PRN
  Administered 2022-03-23 – 2022-03-25 (×3): 4 mg via INTRAVENOUS
  Filled 2022-03-23 (×3): qty 2

## 2022-03-23 MED ORDER — OXYCODONE-ACETAMINOPHEN 5-325 MG PO TABS
1.0000 | ORAL_TABLET | Freq: Four times a day (QID) | ORAL | Status: DC | PRN
  Administered 2022-03-23 – 2022-03-24 (×2): 1 via ORAL
  Filled 2022-03-23 (×2): qty 1

## 2022-03-23 MED ORDER — LUBIPROSTONE 24 MCG PO CAPS
24.0000 ug | ORAL_CAPSULE | Freq: Two times a day (BID) | ORAL | Status: DC
  Administered 2022-03-23 – 2022-03-26 (×7): 24 ug via ORAL
  Filled 2022-03-23 (×8): qty 1

## 2022-03-23 MED ORDER — PANTOPRAZOLE SODIUM 40 MG IV SOLR
40.0000 mg | Freq: Two times a day (BID) | INTRAVENOUS | Status: DC
  Administered 2022-03-23 – 2022-03-24 (×3): 40 mg via INTRAVENOUS
  Filled 2022-03-23 (×3): qty 10

## 2022-03-23 MED ORDER — MIDODRINE HCL 5 MG PO TABS
10.0000 mg | ORAL_TABLET | Freq: Three times a day (TID) | ORAL | Status: DC
  Administered 2022-03-23 – 2022-03-25 (×6): 10 mg
  Filled 2022-03-23 (×6): qty 2

## 2022-03-23 NOTE — Progress Notes (Signed)
PROGRESS NOTE    Janet Hensley  [id number] DOB: 12-31-1970 DOA: 03/22/2022 PCP: Default, Provider, MD   Brief Narrative: 52 year old with past medical history significant for asthma, anxiety, GERD, substance abuse, recent admission December 2023 for severe sepsis, pneumonia, ARDS requiring intubation, blood cultures positive for Rothia Mciliaginosa, urine positive for Legionella, bronchoscopy positive for Pseudomonas and stenotrophomonas, during that admission she underwent ECMO, required tracheostomy [facility].  [facility] and discharged home at the beginning of February.  Patient presents with right side chest pain, shortness of breath.  She was noted to be tachycardic.  White blood cell 11, hemoglobin 11, EKG without acute changes.  She was found to have acute PE with right upper and lower lobe.  No evidence of acute heart strain on CT.  Scattered consolidation within the left upper and lower lobe has significantly improved with increasing cavitary component within the left upper lobe.  Mild groundglass opacity with a right upper and lower lobe likely represent changes from PE.  Since she went home 3 weeks ago she has not been very active, she noticed lower extremity edema.  He continues to smoke half a pack of cigarettes daily.   Assessment & Plan:   Principal Problem:   Acute pulmonary embolism (HCC) Active Problems:   Tobacco use   Asthma, chronic   1-Acute pulmonary embolism: Patient presented with chest pain, shortness of breath. CT angio: Acute pulmonary emboli within the right upper and lower lobe. -Continue with heparin drip.  She might require GI evaluation. -Follow 2D echo and lower extremity Doppler    2-Asthma: Continue with as needed Xopenex and ipratropium  3-Cavitary lung lesion left upper lobe: -Increase in size on CT -Report cough. -Monitor for fever, will  check a sputum culture. -Will need to discuss case with pulmonologist but will wait for sputum culture  Tobacco use: Nicotine patch   Persistent nausea and intermittent vomiting: She report black stool episodes Sigmoidoscopy showed mild inflammatory changes.  Biopsy showed focal ischemic changes.  This was thought to be related to constipation. -To proceed with CT abdomen and pelvis -GI has been consulted -Prior CT scan negative for gallstone -Evaluated by GI who is recommending IV Protonix, she will need to follow-up outpatient for endoscopy procedure when she can be safely be off of anticoagulation  Chronic Hypotension: Continue with midodrine.   Anxiety: Continue with Klonopin.    Estimated body mass index is 28.34 kg/m as calculated from the following:   Height as of this encounter: '5\' 3"'$  (1.6 m).   Weight as of this encounter: 72.6 kg.   DVT prophylaxis: heparin gtt  Code Status: Full code Family Communication: Care discussed with patient.  Disposition Plan:  Status is: Observation The patient will require care spanning > 2 midnights and should be moved to inpatient because: Management of PE, nausea vomiting.  Need to make sure patient is able to tolerate oral medication    Consultants:  GI  Procedures:  ECHO Doppler  Antimicrobials:    Subjective: She reports improvement of chest pain and shortness of breath since admission. She report chronic nausea and intermittent vomiting since she had enema ?  Reported episode of black stool last week. Denies any recent bright blood per rectum other than after she had the sigmoidoscopy she had like 3 days of bloody stool  Objective: Vitals:   03/23/22 0538 03/23/22 0605 03/23/22 0630 03/23/22 0633  BP: (!) 90/55 106/71  Pulse: 92 (!) 108 (!) 106   Resp: '14 18 15   '$ Temp: 98.1 F (36.7 C)   98.1 F (36.7 C)  TempSrc: Oral     SpO2: 96% 100% 100%   Weight:      Height:       No intake or output data in the 24  hours ending 03/23/22 0704 Filed Weights   03/22/22 1821  Weight: 72.6 kg    Examination:  General exam: Appears calm and comfortable  Respiratory system: Clear to auscultation. Respiratory effort normal. Cardiovascular system: S1 & S2 heard, RRR.  Gastrointestinal system: Abdomen is nondistended, soft and nontender. No organomegaly or masses felt. Normal bowel sounds heard. Central nervous system: Alert and oriented. Follows command Extremities: Symmetric 5 x 5 power.   Data Reviewed: I have personally reviewed following labs and imaging studies  CBC: Recent Labs  Lab 03/22/22 1837 03/23/22 0018  WBC 11.0* 9.3  NEUTROABS 6.7  --   HGB 11.3* 10.3*  HCT 36.9 32.6*  MCV 98.9 96.7  PLT 431* [id number]   Basic Metabolic Panel: Recent Labs  Lab 03/22/22 1837  NA 138  K 3.7  CL 105  CO2 23  GLUCOSE 159*  BUN 8  CREATININE 0.91  CALCIUM 8.3*   GFR: Estimated Creatinine Clearance: 69.9 mL/min (by C-G formula based on SCr of 0.91 mg/dL). Liver Function Tests: Recent Labs  Lab 03/22/22 1837  AST 30  ALT 23  ALKPHOS 189*  BILITOT 0.4  PROT 6.4*  ALBUMIN 1.8*   No results for input(s): "LIPASE", "AMYLASE" in the last 168 hours. No results for input(s): "AMMONIA" in the last 168 hours. Coagulation Profile: No results for input(s): "INR", "PROTIME" in the last 168 hours. Cardiac Enzymes: No results for input(s): "CKTOTAL", "CKMB", "CKMBINDEX", "TROPONINI" in the last 168 hours. BNP (last 3 results) No results for input(s): "PROBNP" in the last 8760 hours. HbA1C: No results for input(s): "HGBA1C" in the last 72 hours. CBG: No results for input(s): "GLUCAP" in the last 168 hours. Lipid Profile: No results for input(s): "CHOL", "HDL", "LDLCALC", "TRIG", "CHOLHDL", "LDLDIRECT" in the last 72 hours. Thyroid Function Tests: No results for input(s): "TSH", "T4TOTAL", "FREET4", "T3FREE", "THYROIDAB" in the last 72 hours. Anemia Panel: No results for input(s): "VITAMINB12",  "FOLATE", "FERRITIN", "TIBC", "IRON", "RETICCTPCT" in the last 72 hours. Sepsis Labs: No results for input(s): "PROCALCITON", "LATICACIDVEN" in the last 168 hours.  No results found for this or any previous visit (from the past 240 hour(s)).       Radiology Studies: CT Angio Chest PE W/Cm &/Or Wo Cm  Result Date: 03/22/2022 CLINICAL DATA:  52 year old female with shortness of breath and chest pain. EXAM: CT ANGIOGRAPHY CHEST WITH CONTRAST TECHNIQUE: Multidetector CT imaging of the chest was performed using the standard protocol during bolus administration of intravenous contrast. Multiplanar CT image reconstructions and MIPs were obtained to evaluate the vascular anatomy. RADIATION DOSE REDUCTION: This exam was performed according to the departmental dose-optimization program which includes automated exposure control, adjustment of the mA and/or kV according to patient size and/or use of iterative reconstruction technique. CONTRAST:  68 cc intravenous Omnipaque 350 COMPARISON:  01/11/2022 and prior CTs. 03/22/2022 and prior radiographs FINDINGS: Cardiovascular: Segmental and larger vessel pulmonary emboli are noted within the RIGHT UPPER lobe. RIGHT LOWER lobe segmental pulmonary emboli are also noted. There is no CT evidence of acute heart strain. There is no evidence of thoracic aortic aneurysm or pericardial effusion. Heart size is within normal limits. Mediastinum/Nodes:  No enlarged mediastinal, hilar, or axillary lymph nodes. Thyroid gland, trachea, and esophagus demonstrate no significant findings. Lungs/Pleura: Scattered consolidation within the LEFT UPPER and LOWER lobes have significantly improved, with increasing cavitary component within the LEFT UPPER lobe. Mild ground-glass opacities within the RIGHT UPPER and LOWER lobes likely represents changes from pulmonary emboli. There is no evidence of pneumothorax or pleural effusion. Upper Abdomen: No acute abnormality. Musculoskeletal: No acute  or suspicious bony abnormalities are noted. Review of the MIP images confirms the above findings. IMPRESSION: 1. Acute pulmonary emboli within the RIGHT UPPER and LOWER lobes. No CT evidence of acute heart strain. 2. Scattered consolidation within the LEFT UPPER and LOWER lobes have significantly improved, with increasing cavitary component within the LEFT UPPER lobe. 3. Mild ground-glass opacities within the RIGHT UPPER and LOWER lobes likely represents changes from pulmonary emboli. Critical Value/emergent results were called by telephone at the time of interpretation on 03/22/2022 at 8:55 pm to provider Blanchie Dessert, who verbally acknowledged these results. Electronically Signed   By: Margarette Canada M.D.   On: 03/22/2022 20:57   DG Chest 2 View  Result Date: 03/22/2022 CLINICAL DATA:  Chest pain and shortness of breath. EXAM: CHEST - 2 VIEW COMPARISON:  03/12/2022 chest radiograph and prior studies FINDINGS: The visualized cardiomediastinal silhouette is unchanged. Patchy and linear opacities within the mid-lower LEFT lung, and LEFT apical opacities with probable cavitation are not significantly changed. There is no evidence pneumothorax or large pleural effusion. No acute bony abnormalities are present. IMPRESSION: Unchanged appearance of the chest with LEFT lung opacities and probable LEFT apical cavitation. This may represent continued or residual from recent pneumonia. Radiographic follow-up to resolution is recommended. Electronically Signed   By: Margarette Canada M.D.   On: 03/22/2022 19:22        Scheduled Meds:  nicotine  14 mg Transdermal Daily   Continuous Infusions:  heparin 1,150 Units/hr (03/22/22 2218)     LOS: 0 days    Time spent: 35 minutes.     Elmarie Shiley, MD [facility]   If 7PM-7AM, please contact night-coverage [email or web address]  03/23/2022, 7:04 AM

## 2022-03-23 NOTE — Plan of Care (Signed)

## 2022-03-23 NOTE — Consult Note (Signed)
Consultation  Referring Provider: Dr. Tyrell Antonio     Primary Care Physician:  Default, Provider, MD Primary Gastroenterologist: Althia Forts         Reason for Consultation:   Persistent nausea and vomiting as well as melena         HPI:   Janet Hensley is a 52 y.o. 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  Tylenol, assess unable to wean off vent and required tracheostomy, eventually [facility], underwent tracheostomy decannulation and discharged home with beginning of February.        Patient represented to the hospital on 03/22/2022 with right-sided chest pain and shortness of breath, in the ED noted to be tachycardic to the 120s, labs significant for WBC 11, hemoglobin 11.3 (stable), transaminases alk phos 189 and T. bili normal, CTA with acute pulmonary emboli with the right upper and lower lobes, scattered consolidation of the left upper and lower lobes had significantly improved, mild groundglass opacities in the right and lower lobes.  Patient started on IV heparin.    At time of admission patient describes she went home from the hospital 3 weeks ago and since then has not been very active, few days ago and noticed that her left calf was painful whenever she tried to walk with swelling of both of her legs and right-sided pressure in her chest and shortness of breath.    Today, patient was seen with her husband by her bedside who does assist with history.  Apparently she has had some lingering nausea and vomiting symptoms and generalized abdominal  discomfort ever since her hospitalization here back in January.  Tells me that she can eat something and it may or may not set with her, she can eat the same thing 4 days in a row and not have a problem but on the fifth day she will vomit almost immediately after eating.  She is just not sure what triggers this.  Tells me if her stomach could be under better control then she would maybe able to get out of the bed and move around more often, her stomach discomfort is what keeps her in bed that often.  She is on Pepcid over-the-counter once a day but has ongoing heartburn and reflux symptoms daily.    Also describes one episode of a green/black stool after eating a salad earlier in the day.  This only occurred 1 time on Wednesday, 03/19/2022 and she has seen no bleeding or black stools since.    Does express of the primary reason she came in here was for shortness of breath and pain in her chest as well as calf.  Associated symptoms include some dizziness.    Denies recent drug or alcohol use and no NSAIDs.    Denies fever, chills, weight loss or symptoms that awaken her from sleep.      GI history: 02/11/2022-02/14/2022 admitted to the hospital and consulted by our service for question of colitis, bloody stool, abdominal pain and nonbloody nausea and vomiting, at time of that admission it was noted she had been at [facility] November to December with respiratory failure and severe CAP with left lung  consolidation and pneumonia aspiration, had been transferred to [facility] for ECMO medicine 12/2-12/13 and treated with ECMO and had difficulty weaning off the vent, tracheostomy 12/8 with eventual wean to trach collar, admitted to [facility] on 01/11 for debilitation critical illness myopathy persistent cough nonbloody nausea/vomiting and diffuse abdominal pain 1/12 received smog enema in the setting of persistent nausea vomiting discharge of blood on 1/14, vomits after p.o. intake at that time KUB showed future with short segment of  descending colon possibly colitis that time CT ordered 02/13/2022 flex sigmoidoscopy with only fair preparation of the colon, rectum/descending colon/mid transverse colon and distal transverse colon were normal, patchy mild inflammation in the rectosigmoid colon and distal sigmoid colon, internal hemorrhoids-findings not overtly typical for ischemic colitis but was assessed with biopsies, full colonoscopy is recommended as an outpatient; biopsies showed focal ischemic changes  Past Medical History:  Diagnosis Date   Asthma 01/15/2012    Past Surgical History:  Procedure Laterality Date   BIOPSY  02/13/2022   Procedure: BIOPSY;  Surgeon: Yetta Flock, MD;  Location: [place];  Service: Gastroenterology;;   Otho Darner SIGMOIDOSCOPY N/A 02/13/2022   Procedure: Beryle Quant;  Surgeon: Yetta Flock, MD;  Location: [facility] ENDOSCOPY;  Service: Gastroenterology;  Laterality: N/A;   LEFT HEART CATH AND CORONARY ANGIOGRAPHY N/A 12/24/2021   Procedure: LEFT HEART CATH AND CORONARY ANGIOGRAPHY;  Surgeon: Nelva Bush, MD;  Location: [place] [facility];  Service: Cardiovascular;  Laterality: N/A;    Family History: No Gi Cancer  Social History   Tobacco Use   Smoking status: Every Day    Types: Cigarettes    Prior to Admission medications   Medication Sig Start Date End Date Taking? Authorizing Provider  alum & mag hydroxide-simeth (MAALOX PLUS) 400-400-40 MG/5ML suspension Take 5 mLs by mouth 3 (three) times daily before meals. 03/12/22  Yes Durel Salts C, DO  clonazePAM (KLONOPIN) 0.5 MG tablet Take 1 tablet (0.5 mg total) by mouth 3 (three) times daily. 02/27/22  Yes Love, Ivan Anchors, PA-C  docusate sodium (COLACE) 100 MG capsule Take 1 capsule (100 mg total) by mouth 2 (two) times daily. 02/27/22  Yes Love, Ivan Anchors, PA-C  famotidine (PEPCID) 20 MG tablet Place 1 tablet (20 mg total) into feeding tube 2 (two) times daily. 02/27/22  Yes Love, Ivan Anchors, PA-C   ipratropium-albuterol (DUONEB) 0.5-2.5 (3) MG/3ML SOLN Take 3 mLs by nebulization every 4 (four) hours as needed. Patient taking differently: Take 3 mLs by nebulization every 4 (four) hours as needed (sob/wheezing). 01/08/22  Yes Mick Sell, PA-C  lubiprostone (AMITIZA) 24 MCG capsule Take 1 capsule (24 mcg total) by mouth 2 (two) times daily with a meal. 02/27/22  Yes Love, Ivan Anchors, PA-C  melatonin 3 MG TABS tablet Take 1 tablet (3 mg total) by mouth at bedtime. Patient taking differently: Take 3 mg by mouth at bedtime as needed (sleep). 02/27/22  Yes Love, Ivan Anchors, PA-C  midodrine (PROAMATINE) 10 MG tablet Place 1 tablet (10 mg total) into feeding tube 3 (three) times daily. 02/27/22  Yes Love, Ivan Anchors, PA-C  oxyCODONE-acetaminophen (PERCOCET) 10-325 MG tablet Take 1 tablet by mouth every 6 (six) hours as needed for pain.   Yes [provider]  polyethylene glycol powder (GLYCOLAX/MIRALAX) 17 GM/SCOOP powder Take 1 capful with water (17 g) by mouth daily. Patient taking differently: Take 17 g by mouth daily as needed for moderate constipation. 02/28/22  Yes Love, Ivan Anchors, PA-C  potassium chloride SA (KLOR-CON  M) 20 MEQ tablet Take 2 tablets (40 mEq total) by mouth 2 (two) times daily. 02/27/22  Yes Love, Ivan Anchors, PA-C  senna (SENOKOT) 8.6 MG TABS tablet Take 1 tablet (8.6 mg total) by mouth at bedtime. Patient taking differently: Take 1 tablet by mouth at bedtime as needed for moderate constipation. 02/27/22  Yes Love, Ivan Anchors, PA-C  acetaminophen (TYLENOL) 160 MG/5ML solution Take 20.3 mLs (650 mg total) by mouth every 6 (six) hours. 01/08/22   Mick Sell, PA-C    Current Facility-Administered Medications  Medication Dose Route Frequency Provider Last Rate Last Admin   acetaminophen (TYLENOL) tablet 650 mg  650 mg Oral Q6H PRN Shela Leff, MD       Or   acetaminophen (TYLENOL) suppository 650 mg  650 mg Rectal Q6H PRN Shela Leff, MD       clonazePAM (KLONOPIN) tablet  0.5 mg  0.5 mg Oral TID Regalado, Belkys A, MD       docusate sodium (COLACE) capsule 100 mg  100 mg Oral BID Regalado, Belkys A, MD   100 mg at 03/23/22 1006   heparin ADULT infusion 100 units/mL (25000 units/229m)  1,150 Units/hr Intravenous Continuous RShela Leff MD 11.5 mL/hr at 03/23/22 1006 1,150 Units/hr at 03/23/22 1006   ipratropium (ATROVENT) nebulizer solution 0.5 mg  0.5 mg Nebulization Q6H PRN Regalado, Belkys A, MD       levalbuterol (XOPENEX) nebulizer solution 0.63 mg  0.63 mg Nebulization Q8H PRN RShela Leff MD       lubiprostone (AMITIZA) capsule 24 mcg  24 mcg Oral BID WC Regalado, Belkys A, MD   24 mcg at 03/23/22 1014   melatonin tablet 3 mg  3 mg Oral QHS PRN Regalado, Belkys A, MD       midodrine (PROAMATINE) tablet 10 mg  10 mg Per Tube TID Regalado, Belkys A, MD       nicotine (NICODERM CQ - dosed in mg/24 hours) patch 14 mg  14 mg Transdermal Daily RShela Leff MD   14 mg at 03/23/22 0632   ondansetron (ZOFRAN) injection 4 mg  4 mg Intravenous Q6H PRN Regalado, Belkys A, MD       oxyCODONE-acetaminophen (PERCOCET/ROXICET) 5-325 MG per tablet 1 tablet  1 tablet Oral Q6H PRN Regalado, Belkys A, MD       And   oxyCODONE (Oxy IR/ROXICODONE) immediate release tablet 5 mg  5 mg Oral Q6H PRN Regalado, Belkys A, MD       pantoprazole (PROTONIX) injection 40 mg  40 mg Intravenous Q12H Regalado, Belkys A, MD   40 mg at 03/23/22 1005   Current Outpatient Medications  Medication Sig Dispense Refill   alum & mag hydroxide-simeth (MAALOX PLUS) 400-400-40 MG/5ML suspension Take 5 mLs by mouth 3 (three) times daily before meals. 355 mL 0   clonazePAM (KLONOPIN) 0.5 MG tablet Take 1 tablet (0.5 mg total) by mouth 3 (three) times daily. 45 tablet 0   docusate sodium (COLACE) 100 MG capsule Take 1 capsule (100 mg total) by mouth 2 (two) times daily. 60 capsule 0   famotidine (PEPCID) 20 MG tablet Place 1 tablet (20 mg total) into feeding tube 2 (two) times daily. 60  tablet 0   ipratropium-albuterol (DUONEB) 0.5-2.5 (3) MG/3ML SOLN Take 3 mLs by nebulization every 4 (four) hours as needed. (Patient taking differently: Take 3 mLs by nebulization every 4 (four) hours as needed (sob/wheezing).) 360 mL    lubiprostone (AMITIZA) 24 MCG capsule Take 1 capsule (  24 mcg total) by mouth 2 (two) times daily with a meal. 60 capsule 0   melatonin 3 MG TABS tablet Take 1 tablet (3 mg total) by mouth at bedtime. (Patient taking differently: Take 3 mg by mouth at bedtime as needed (sleep).) 30 tablet 0   midodrine (PROAMATINE) 10 MG tablet Place 1 tablet (10 mg total) into feeding tube 3 (three) times daily. 90 tablet 0   oxyCODONE-acetaminophen (PERCOCET) 10-325 MG tablet Take 1 tablet by mouth every 6 (six) hours as needed for pain.     polyethylene glycol powder (GLYCOLAX/MIRALAX) 17 GM/SCOOP powder Take 1 capful with water (17 g) by mouth daily. (Patient taking differently: Take 17 g by mouth daily as needed for moderate constipation.) 238 g 0   potassium chloride SA (KLOR-CON M) 20 MEQ tablet Take 2 tablets (40 mEq total) by mouth 2 (two) times daily. 120 tablet 0   senna (SENOKOT) 8.6 MG TABS tablet Take 1 tablet (8.6 mg total) by mouth at bedtime. (Patient taking differently: Take 1 tablet by mouth at bedtime as needed for moderate constipation.) 30 tablet 0   acetaminophen (TYLENOL) 160 MG/5ML solution Take 20.3 mLs (650 mg total) by mouth every 6 (six) hours. 120 mL 0    Allergies as of 03/22/2022   (No Known Allergies)     Review of Systems:    Constitutional: No weight loss, fever or chills Skin: No rash  Cardiovascular: See HPI Respiratory: See HPI Gastrointestinal: See HPI and otherwise negative Genitourinary: No dysuria Neurological: +dizziness Musculoskeletal: No new muscle or joint pain Hematologic: No bleeding  Psychiatric: No history of depression or anxiety    Physical Exam:  Vital signs in last 24 hours: Temp:  [98 F (36.7 C)-98.9 F (37.2  C)] 98.3 F (36.8 C) (02/25 0845) Pulse Rate:  [92-126] 104 (02/25 0845) Resp:  [12-20] 16 (02/25 0845) BP: (90-129)/(54-80) 126/80 (02/25 0845) SpO2:  [96 %-100 %] 99 % (02/25 0845) Weight:  [72.6 kg] 72.6 kg (02/24 1821)   General:   Pleasant Caucasian female appears to be in NAD, Well developed, Well nourished, alert and cooperative Head:  Normocephalic and atraumatic. Eyes:   PEERL, EOMI. No icterus. Conjunctiva pink. Ears:  Normal auditory acuity. Neck:  Supple Throat: Oral cavity and pharynx without inflammation, swelling or lesion.  Lungs: Respirations even and unlabored. Lungs clear to auscultation bilaterally.   No wheezes, crackles, or rhonchi.  Heart: Normal S1, S2. No MRG. Regular rate and rhythm. No peripheral edema, cyanosis or pallor.  Abdomen:  Soft, mild generalized distention, nontender. No rebound or guarding. Normal bowel sounds. No appreciable masses or hepatomegaly. Rectal:  Not performed.  Msk:  Symmetrical without gross deformities. Peripheral pulses intact.  Extremities:  Without edema, no deformity or joint abnormality. Neurologic:  Alert and  oriented x4;  grossly normal neurologically.   Skin:   Dry and intact without significant lesions or rashes. Psychiatric: Demonstrates good judgement and reason without abnormal affect or behaviors.  LAB RESULTS: Recent Labs    03/22/22 1837 03/23/22 0018  WBC 11.0* 9.3  HGB 11.3* 10.3*  HCT 36.9 32.6*  PLT 431* 365   BMET Recent Labs    03/22/22 1837  NA 138  K 3.7  CL 105  CO2 23  GLUCOSE 159*  BUN 8  CREATININE 0.91  CALCIUM 8.3*   LFT Recent Labs    03/22/22 1837  PROT 6.4*  ALBUMIN 1.8*  AST 30  ALT 23  ALKPHOS 189*  BILITOT 0.4  STUDIES: VAS [name] LOWER EXTREMITY VENOUS (DVT)  Result Date: 03/23/2022  Lower Venous DVT Study Patient Name:  WILL SCHMELTZER  Date of Exam:   03/23/2022 Medical Rec #: [id number]      Accession #:    [id number] Date of Birth: 1970-07-20      Patient Gender: F  Patient Age:   51 years Exam Location:  [facility] Procedure:      VAS [name] LOWER EXTREMITY VENOUS (DVT) Referring Phys: Wandra Feinstein RATHORE --------------------------------------------------------------------------------  Indications: Pulmonary embolism, and recent left calf pain.  Comparison Study: No prior study Performing Technologist: Sharion Dove RVS  Examination Guidelines: A complete evaluation includes B-mode imaging, spectral Doppler, color Doppler, and power Doppler as needed of all accessible portions of each vessel. Bilateral testing is considered an integral part of a complete examination. Limited examinations for reoccurring indications may be performed as noted. The reflux portion of the exam is performed with the patient in reverse Trendelenburg.  +---------+---------------+---------+-----------+----------+--------------+ RIGHT    CompressibilityPhasicitySpontaneityPropertiesThrombus Aging +---------+---------------+---------+-----------+----------+--------------+ CFV      Full           Yes      Yes                                 +---------+---------------+---------+-----------+----------+--------------+ SFJ      Full                                                        +---------+---------------+---------+-----------+----------+--------------+ FV Prox  Full                                                        +---------+---------------+---------+-----------+----------+--------------+ FV Mid   Full                                                        +---------+---------------+---------+-----------+----------+--------------+ FV DistalFull                                                        +---------+---------------+---------+-----------+----------+--------------+ PFV      Full                                                        +---------+---------------+---------+-----------+----------+--------------+ POP      Full            Yes      Yes                                 +---------+---------------+---------+-----------+----------+--------------+ PTV  Full                                                        +---------+---------------+---------+-----------+----------+--------------+ PERO     Full                                                        +---------+---------------+---------+-----------+----------+--------------+   +---------+---------------+---------+-----------+----------+-----------------+ LEFT     CompressibilityPhasicitySpontaneityPropertiesThrombus Aging    +---------+---------------+---------+-----------+----------+-----------------+ CFV      Full           Yes      Yes                                    +---------+---------------+---------+-----------+----------+-----------------+ SFJ      Full                                                           +---------+---------------+---------+-----------+----------+-----------------+ FV Prox  Full                                                           +---------+---------------+---------+-----------+----------+-----------------+ FV Mid   Full                                                           +---------+---------------+---------+-----------+----------+-----------------+ FV DistalFull                                                           +---------+---------------+---------+-----------+----------+-----------------+ PFV      Full                                                           +---------+---------------+---------+-----------+----------+-----------------+ POP      Full           Yes      Yes                                    +---------+---------------+---------+-----------+----------+-----------------+ PTV      None  Age Indeterminate +---------+---------------+---------+-----------+----------+-----------------+ PERO     None                                          Age Indeterminate +---------+---------------+---------+-----------+----------+-----------------+     Summary: RIGHT: - There is no evidence of deep vein thrombosis in the lower extremity.  LEFT: - Findings consistent with age indeterminate deep vein thrombosis involving the left posterior tibial veins, and left peroneal veins.  *See table(s) above for measurements and observations.    Preliminary    CT Angio Chest PE W/Cm &/Or Wo Cm  Result Date: 03/22/2022 CLINICAL DATA:  52 year old female with shortness of breath and chest pain. EXAM: CT ANGIOGRAPHY CHEST WITH CONTRAST TECHNIQUE: Multidetector CT imaging of the chest was performed using the standard protocol during bolus administration of intravenous contrast. Multiplanar CT image reconstructions and MIPs were obtained to evaluate the vascular anatomy. RADIATION DOSE REDUCTION: This exam was performed according to the departmental dose-optimization program which includes automated exposure control, adjustment of the mA and/or kV according to patient size and/or use of iterative reconstruction technique. CONTRAST:  68 cc intravenous Omnipaque 350 COMPARISON:  01/11/2022 and prior CTs. 03/22/2022 and prior radiographs FINDINGS: Cardiovascular: Segmental and larger vessel pulmonary emboli are noted within the RIGHT UPPER lobe. RIGHT LOWER lobe segmental pulmonary emboli are also noted. There is no CT evidence of acute heart strain. There is no evidence of thoracic aortic aneurysm or pericardial effusion. Heart size is within normal limits. Mediastinum/Nodes: No enlarged mediastinal, hilar, or axillary lymph nodes. Thyroid gland, trachea, and esophagus demonstrate no significant findings. Lungs/Pleura: Scattered consolidation within the LEFT UPPER and LOWER lobes have significantly improved, with increasing cavitary component within the LEFT UPPER lobe. Mild ground-glass opacities within the RIGHT UPPER and LOWER lobes  likely represents changes from pulmonary emboli. There is no evidence of pneumothorax or pleural effusion. Upper Abdomen: No acute abnormality. Musculoskeletal: No acute or suspicious bony abnormalities are noted. Review of the MIP images confirms the above findings. IMPRESSION: 1. Acute pulmonary emboli within the RIGHT UPPER and LOWER lobes. No CT evidence of acute heart strain. 2. Scattered consolidation within the LEFT UPPER and LOWER lobes have significantly improved, with increasing cavitary component within the LEFT UPPER lobe. 3. Mild ground-glass opacities within the RIGHT UPPER and LOWER lobes likely represents changes from pulmonary emboli. Critical Value/emergent results were called by telephone at the time of interpretation on 03/22/2022 at 8:55 pm to provider Blanchie Dessert, who verbally acknowledged these results. Electronically Signed   By: Margarette Canada M.D.   On: 03/22/2022 20:57   DG Chest 2 View  Result Date: 03/22/2022 CLINICAL DATA:  Chest pain and shortness of breath. EXAM: CHEST - 2 VIEW COMPARISON:  03/12/2022 chest radiograph and prior studies FINDINGS: The visualized cardiomediastinal silhouette is unchanged. Patchy and linear opacities within the mid-lower LEFT lung, and LEFT apical opacities with probable cavitation are not significantly changed. There is no evidence pneumothorax or large pleural effusion. No acute bony abnormalities are present. IMPRESSION: Unchanged appearance of the chest with LEFT lung opacities and probable LEFT apical cavitation. This may represent continued or residual from recent pneumonia. Radiographic follow-up to resolution is recommended. Electronically Signed   By: Margarette Canada M.D.   On: 03/22/2022 19:22     Impression / Plan:   Patient: 1.  Acute pulmonary embolism: Recent prolonged hospitalization/immobility and cigarette smoking, CTA with acute  PE in both upper lobes, tachycardic to the 110s, now on heparin drip, hemoglobin 11.3--> 10.3  overnight 2.  Asthma 3.  Nausea and vomiting: With reflux and generalized abdominal discomfort/bloating, this has been going on for the past month and a half since she has been in the hospital and is not an acute issue, it is not all the time, just hit her miss; consider relation to reflux/gastritis versus other 4.  Dark stool: Patient describes 1 episode of a darkish/greenish stool after eating salad about 4 days ago, none since no bright red blood, hemoglobin is mostly stable since starting heparin, no sign of acute GI bleed; likely diet related  Plan: 1.  At this point with no sign of acute GI bleed and hemoglobin mostly stable after starting Heparin, would recommend observation.  Continue to monitor hemoglobin every 8 hours with transfusion as needed less than 7.  If patient has acute drop in hemoglobin or signs of GI bleed then could consider more emergent EGD +/- colonoscopy during this hospitalization. 2.  At this point no plans for emergent procedures. 3.  Will start the patient on Pantoprazole 40 IV twice daily as she is having ongoing reflux symptoms which could be adding to her nausea. 4.  EGD/colonoscopy would likely be beneficial as an outpatient unless something changes here when she is safely able to be off of anticoagulation 5. Has follow up 3/13 in office with Dr. Lynnell Chad  Thank you for your kind consultation, but we will likely move to standby.  We will not be seeing the patient any further unless you call and let us know that something has changed acutely.  Dr. Lorenso Courier will be taking over our service tomorrow.  Lavone Nian [facility]  03/23/2022, 10:23 AM

## 2022-03-23 NOTE — Progress Notes (Signed)
VASCULAR LAB    Bilateral lower extremity venous duplex has been performed.  See CV proc for preliminary results.   Imad Shostak, RVT 03/23/2022, 9:49 AM

## 2022-03-23 NOTE — Progress Notes (Signed)
  Echocardiogram 2D Echocardiogram has been performed.  Janet Hensley 03/23/2022, 9:55 AM

## 2022-03-23 NOTE — ED Notes (Signed)
ED TO INPATIENT HANDOFF REPORT  ED Nurse Name and Phone #: [phone]  S Name/Age/Gender Janet Hensley 52 y.o. female Room/Bed: 001C/001C  Code Status   Code Status: Full Code  Home/SNF/Other Home Patient oriented to: self, place, time, and situation Is this baseline? Yes   Triage Complete: Triage complete  Chief Complaint Acute pulmonary embolism ([place]) [I26.99]  Triage Note Pt c/o non radiating right sided chest pain described as tightness since this morning. Endorses shortness of breath. Pt states it feels like she has congestion in chest that she cannot get up. Recently hospitalized for 3 months for pneumonia - d/c 1/11.    Allergies No Known Allergies  Level of Care/Admitting Diagnosis ED Disposition     ED Disposition  Admit   Condition  --   Comment  Hospital Area: [place] [[id number]]  Level of Care: Telemetry Medical [104]  May place patient in observation at [facility] or [place] if equivalent level of care is available:: Yes  Covid Evaluation: Asymptomatic - no recent exposure (last 10 days) testing not required  Diagnosis: Acute pulmonary embolism [facility]) [id number]  Admitting Physician: Shela Leff [id number]  Attending Physician: Shela Leff [id number]          B Medical/Surgery History Past Medical History:  Diagnosis Date   Asthma 01/15/2012   Past Surgical History:  Procedure Laterality Date   BIOPSY  02/13/2022   Procedure: BIOPSY;  Surgeon: Yetta Flock, MD;  Location: [place];  Service: Gastroenterology;;   Otho Darner SIGMOIDOSCOPY N/A 02/13/2022   Procedure: Beryle Quant;  Surgeon: Yetta Flock, MD;  Location: [place];  Service: Gastroenterology;  Laterality: N/A;   LEFT HEART CATH AND CORONARY ANGIOGRAPHY N/A 12/24/2021   Procedure: LEFT HEART CATH AND CORONARY ANGIOGRAPHY;  Surgeon: Nelva Bush, MD;  Location: [place] [facility];  Service: Cardiovascular;  Laterality:  N/A;     A IV Location/Drains/Wounds Patient Lines/Drains/Airways Status     Active Line/Drains/Airways     Name Placement date Placement time Site Days   Peripheral IV 03/22/22 20 G Left Antecubital 03/22/22  1845  Antecubital  1   Peripheral IV 03/23/22 22 G Anterior;Left Forearm 03/23/22  1045  Forearm  less than 1            Intake/Output Last 24 hours No intake or output data in the 24 hours ending 03/23/22 1055  Labs/Imaging Results for orders placed or performed during the hospital encounter of 03/22/22 (from the past 48 hour(s))  Comprehensive metabolic panel     Status: Abnormal   Collection Time: 03/22/22  6:37 PM  Result Value Ref Range   Sodium 138 135 - 145 mmol/L   Potassium 3.7 3.5 - 5.1 mmol/L   Chloride 105 98 - 111 mmol/L   CO2 23 22 - 32 mmol/L   Glucose, Bld 159 (H) 70 - 99 mg/dL    Comment: Glucose reference range applies only to samples taken after fasting for at least 8 hours.   BUN 8 6 - 20 mg/dL   Creatinine, Ser 0.91 0.44 - 1.00 mg/dL   Calcium 8.3 (L) 8.9 - 10.3 mg/dL   Total Protein 6.4 (L) 6.5 - 8.1 g/dL   Albumin 1.8 (L) 3.5 - 5.0 g/dL   AST 30 15 - 41 U/L   ALT 23 0 - 44 U/L   Alkaline Phosphatase 189 (H) 38 - 126 U/L   Total Bilirubin 0.4 0.3 - 1.2 mg/dL   GFR, Estimated >60 >60  mL/min    Comment: (NOTE) Calculated using the CKD-EPI Creatinine Equation (2021)    Anion gap 10 5 - 15    Comment: Performed at [facility], [place] [address], [place], [place] [address]  Troponin I (High Sensitivity)     Status: None   Collection Time: 03/22/22  6:37 PM  Result Value Ref Range   Troponin I (High Sensitivity) 7 <18 ng/L    Comment: (NOTE) Elevated high sensitivity troponin I (hsTnI) values and significant  changes across serial measurements may suggest ACS but many other  chronic and acute conditions are known to elevate hsTnI results.  Refer to the "Links" section for chest pain algorithms and additional  guidance. Performed at  [facility], [place] [address], [place], [place] [address]   CBC with Differential     Status: Abnormal   Collection Time: 03/22/22  6:37 PM  Result Value Ref Range   WBC 11.0 (H) 4.0 - 10.5 K/uL   RBC 3.73 (L) 3.87 - 5.11 MIL/uL   Hemoglobin 11.3 (L) 12.0 - 15.0 g/dL   HCT 36.9 36.0 - 46.0 %   MCV 98.9 80.0 - 100.0 fL   MCH 30.3 26.0 - 34.0 pg   MCHC 30.6 30.0 - 36.0 g/dL   RDW 14.7 11.5 - 15.5 %   Platelets 431 (H) 150 - 400 K/uL   nRBC 0.0 0.0 - 0.2 %   Neutrophils Relative % 61 %   Neutro Abs 6.7 1.7 - 7.7 K/uL   Lymphocytes Relative 29 %   Lymphs Abs 3.2 0.7 - 4.0 K/uL   Monocytes Relative 8 %   Monocytes Absolute 0.8 0.1 - 1.0 K/uL   Eosinophils Relative 2 %   Eosinophils Absolute 0.2 0.0 - 0.5 K/uL   Basophils Relative 0 %   Basophils Absolute 0.0 0.0 - 0.1 K/uL   Immature Granulocytes 0 %   Abs Immature Granulocytes 0.03 0.00 - 0.07 K/uL    Comment: Performed at [facility] [address], [place], [place] [address]  Brain natriuretic peptide     Status: None   Collection Time: 03/22/22  6:37 PM  Result Value Ref Range   B Natriuretic Peptide 58.0 0.0 - 100.0 pg/mL    Comment: Performed at [facility] [address], [place], [place] [address]  Troponin I (High Sensitivity)     Status: None   Collection Time: 03/22/22 10:00 PM  Result Value Ref Range   Troponin I (High Sensitivity) 6 <18 ng/L    Comment: (NOTE) Elevated high sensitivity troponin I (hsTnI) values and significant  changes across serial measurements may suggest ACS but many other  chronic and acute conditions are known to elevate hsTnI results.  Refer to the "Links" section for chest pain algorithms and additional  guidance. Performed at [facility], [place] [address] [place], [place] [address]   Heparin level (unfractionated)     Status: Abnormal   Collection Time: 03/23/22 12:18 AM  Result Value Ref Range   Heparin Unfractionated 0.71 (H) 0.30 - 0.70 IU/mL    Comment:  (NOTE) The clinical reportable range upper limit is being lowered to >1.10 to align with the FDA approved guidance for the current laboratory assay.  If heparin results are below expected values, and patient dosage has  been confirmed, suggest follow up testing of antithrombin III levels. Performed at [facility], [place] [address] [place], [place] [address]   CBC  Status: Abnormal   Collection Time: 03/23/22 12:18 AM  Result Value Ref Range   WBC 9.3 4.0 - 10.5 K/uL   RBC 3.37 (L) 3.87 - 5.11 MIL/uL   Hemoglobin 10.3 (L) 12.0 - 15.0 g/dL   HCT 32.6 (L) 36.0 - 46.0 %   MCV 96.7 80.0 - 100.0 fL   MCH 30.6 26.0 - 34.0 pg   MCHC 31.6 30.0 - 36.0 g/dL   RDW 14.6 11.5 - 15.5 %   Platelets 365 150 - 400 K/uL   nRBC 0.0 0.0 - 0.2 %    Comment: Performed at [facility], [place] [address], [place], [place] [address]  Heparin level (unfractionated)     Status: None   Collection Time: 03/23/22  8:30 AM  Result Value Ref Range   Heparin Unfractionated 0.46 0.30 - 0.70 IU/mL    Comment: (NOTE) The clinical reportable range upper limit is being lowered to >1.10 to align with the FDA approved guidance for the current laboratory assay.  If heparin results are below expected values, and patient dosage has  been confirmed, suggest follow up testing of antithrombin III levels. Performed at [facility], [place] [address], [place], [place] [address]    VAS [name] LOWER EXTREMITY VENOUS (DVT)  Result Date: 03/23/2022  Lower Venous DVT Study Patient Name:  SAVANNA HANDLIN  Date of Exam:   03/23/2022 Medical Rec #: [id number]      Accession #:    [id number] Date of Birth: December 29, 1970      Patient Gender: F Patient Age:   37 years Exam Location:  [facility] Procedure:      VAS [name] LOWER EXTREMITY VENOUS (DVT) Referring Phys: Wandra Feinstein RATHORE --------------------------------------------------------------------------------  Indications: Pulmonary embolism, and recent left calf pain.   Comparison Study: No prior study Performing Technologist: Sharion Dove RVS  Examination Guidelines: A complete evaluation includes B-mode imaging, spectral Doppler, color Doppler, and power Doppler as needed of all accessible portions of each vessel. Bilateral testing is considered an integral part of a complete examination. Limited examinations for reoccurring indications may be performed as noted. The reflux portion of the exam is performed with the patient in reverse Trendelenburg.  +---------+---------------+---------+-----------+----------+--------------+ RIGHT    CompressibilityPhasicitySpontaneityPropertiesThrombus Aging +---------+---------------+---------+-----------+----------+--------------+ CFV      Full           Yes      Yes                                 +---------+---------------+---------+-----------+----------+--------------+ SFJ      Full                                                        +---------+---------------+---------+-----------+----------+--------------+ FV Prox  Full                                                        +---------+---------------+---------+-----------+----------+--------------+ FV Mid   Full                                                        +---------+---------------+---------+-----------+----------+--------------+  FV DistalFull                                                        +---------+---------------+---------+-----------+----------+--------------+ PFV      Full                                                        +---------+---------------+---------+-----------+----------+--------------+ POP      Full           Yes      Yes                                 +---------+---------------+---------+-----------+----------+--------------+ PTV      Full                                                        +---------+---------------+---------+-----------+----------+--------------+ PERO      Full                                                        +---------+---------------+---------+-----------+----------+--------------+   +---------+---------------+---------+-----------+----------+-----------------+ LEFT     CompressibilityPhasicitySpontaneityPropertiesThrombus Aging    +---------+---------------+---------+-----------+----------+-----------------+ CFV      Full           Yes      Yes                                    +---------+---------------+---------+-----------+----------+-----------------+ SFJ      Full                                                           +---------+---------------+---------+-----------+----------+-----------------+ FV Prox  Full                                                           +---------+---------------+---------+-----------+----------+-----------------+ FV Mid   Full                                                           +---------+---------------+---------+-----------+----------+-----------------+ FV DistalFull                                                           +---------+---------------+---------+-----------+----------+-----------------+  PFV      Full                                                           +---------+---------------+---------+-----------+----------+-----------------+ POP      Full           Yes      Yes                                    +---------+---------------+---------+-----------+----------+-----------------+ PTV      None                                         Age Indeterminate +---------+---------------+---------+-----------+----------+-----------------+ PERO     None                                         Age Indeterminate +---------+---------------+---------+-----------+----------+-----------------+     Summary: RIGHT: - There is no evidence of deep vein thrombosis in the lower extremity.  LEFT: - Findings consistent with age indeterminate  deep vein thrombosis involving the left posterior tibial veins, and left peroneal veins.  *See table(s) above for measurements and observations.    Preliminary    CT Angio Chest PE W/Cm &/Or Wo Cm  Result Date: 03/22/2022 CLINICAL DATA:  52 year old female with shortness of breath and chest pain. EXAM: CT ANGIOGRAPHY CHEST WITH CONTRAST TECHNIQUE: Multidetector CT imaging of the chest was performed using the standard protocol during bolus administration of intravenous contrast. Multiplanar CT image reconstructions and MIPs were obtained to evaluate the vascular anatomy. RADIATION DOSE REDUCTION: This exam was performed according to the departmental dose-optimization program which includes automated exposure control, adjustment of the mA and/or kV according to patient size and/or use of iterative reconstruction technique. CONTRAST:  68 cc intravenous Omnipaque 350 COMPARISON:  01/11/2022 and prior CTs. 03/22/2022 and prior radiographs FINDINGS: Cardiovascular: Segmental and larger vessel pulmonary emboli are noted within the RIGHT UPPER lobe. RIGHT LOWER lobe segmental pulmonary emboli are also noted. There is no CT evidence of acute heart strain. There is no evidence of thoracic aortic aneurysm or pericardial effusion. Heart size is within normal limits. Mediastinum/Nodes: No enlarged mediastinal, hilar, or axillary lymph nodes. Thyroid gland, trachea, and esophagus demonstrate no significant findings. Lungs/Pleura: Scattered consolidation within the LEFT UPPER and LOWER lobes have significantly improved, with increasing cavitary component within the LEFT UPPER lobe. Mild ground-glass opacities within the RIGHT UPPER and LOWER lobes likely represents changes from pulmonary emboli. There is no evidence of pneumothorax or pleural effusion. Upper Abdomen: No acute abnormality. Musculoskeletal: No acute or suspicious bony abnormalities are noted. Review of the MIP images confirms the above findings. IMPRESSION: 1.  Acute pulmonary emboli within the RIGHT UPPER and LOWER lobes. No CT evidence of acute heart strain. 2. Scattered consolidation within the LEFT UPPER and LOWER lobes have significantly improved, with increasing cavitary component within the LEFT UPPER lobe. 3. Mild ground-glass opacities within the RIGHT UPPER and LOWER lobes likely represents changes from pulmonary emboli. Critical Value/emergent results were called by telephone at the  time of interpretation on 03/22/2022 at 8:55 pm to provider Blanchie Dessert, who verbally acknowledged these results. Electronically Signed   By: Margarette Canada M.D.   On: 03/22/2022 20:57   DG Chest 2 View  Result Date: 03/22/2022 CLINICAL DATA:  Chest pain and shortness of breath. EXAM: CHEST - 2 VIEW COMPARISON:  03/12/2022 chest radiograph and prior studies FINDINGS: The visualized cardiomediastinal silhouette is unchanged. Patchy and linear opacities within the mid-lower LEFT lung, and LEFT apical opacities with probable cavitation are not significantly changed. There is no evidence pneumothorax or large pleural effusion. No acute bony abnormalities are present. IMPRESSION: Unchanged appearance of the chest with LEFT lung opacities and probable LEFT apical cavitation. This may represent continued or residual from recent pneumonia. Radiographic follow-up to resolution is recommended. Electronically Signed   By: Margarette Canada M.D.   On: 03/22/2022 19:22    Pending Labs Unresulted Labs (From admission, onward)     Start     Ordered   03/23/22 0824  Lipase, blood  Once,   R        03/23/22 0823   03/23/22 0712  Expectorated Sputum Assessment w Gram Stain, Rflx to Resp Cult  Once,   R        03/23/22 0711   03/23/22 0500  Heparin level (unfractionated)  Daily,   R     See Hyperspace for full Linked Orders Report.   03/22/22 2112   03/23/22 0500  CBC  Daily,   R     See Hyperspace for full Linked Orders Report.   03/22/22 2112   Unscheduled  Occult blood card to lab,  stool  As needed,   R      03/23/22 0832            Vitals/Pain Today's Vitals   03/23/22 0630 03/23/22 0633 03/23/22 0845 03/23/22 1045  BP:   126/80 121/74  Pulse: (!) 106  (!) 104 96  Resp: 15  16 (!) 25  Temp:  98.1 F (36.7 C) 98.3 F (36.8 C) 98.2 F (36.8 C)  TempSrc:   Oral   SpO2: 100%  99% 100%  Weight:      Height:      PainSc:   0-No pain     Isolation Precautions No active isolations  Medications Medications  heparin ADULT infusion 100 units/mL (25000 units/249m) (1,150 Units/hr Intravenous New Bag/Given 03/23/22 1006)  acetaminophen (TYLENOL) tablet 650 mg (has no administration in time range)    Or  acetaminophen (TYLENOL) suppository 650 mg (has no administration in time range)  nicotine (NICODERM CQ - dosed in mg/24 hours) patch 14 mg (14 mg Transdermal Patch Removed 03/23/22 1016)  levalbuterol (XOPENEX) nebulizer solution 0.63 mg (has no administration in time range)  pantoprazole (PROTONIX) injection 40 mg (40 mg Intravenous Given 03/23/22 1005)  ondansetron (ZOFRAN) injection 4 mg (has no administration in time range)  clonazePAM (KLONOPIN) tablet 0.5 mg (has no administration in time range)  docusate sodium (COLACE) capsule 100 mg (100 mg Oral Given 03/23/22 1006)  lubiprostone (AMITIZA) capsule 24 mcg (24 mcg Oral Given 03/23/22 1014)  ipratropium (ATROVENT) nebulizer solution 0.5 mg (has no administration in time range)  melatonin tablet 3 mg (has no administration in time range)  midodrine (PROAMATINE) tablet 10 mg (10 mg Per Tube Not Given 03/23/22 1015)  oxyCODONE-acetaminophen (PERCOCET/ROXICET) 5-325 MG per tablet 1 tablet (has no administration in time range)    And  oxyCODONE (Oxy IR/ROXICODONE) immediate release tablet 5  mg (has no administration in time range)  iohexol (OMNIPAQUE) 350 MG/ML injection 68 mL (68 mLs Intravenous Contrast Given 03/22/22 2041)  heparin bolus via infusion 4,000 Units (4,000 Units Intravenous Bolus from Bag 03/22/22  2223)    Mobility walks     Focused Assessments Pulmonary Assessment Handoff:  Lung sounds: Bilateral Breath Sounds: Clear O2 Device: Bag-valve Mask      R Recommendations: See Admitting Provider Note  Report given to:   Additional Notes: .

## 2022-03-23 NOTE — Progress Notes (Signed)
[place] for heparin Indication: pulmonary embolus Brief A/P: Heparin level slightly supratherapeutic  Continue Heparin at current rate for now  No Known Allergies  Patient Measurements: Height: '5\' 3"'$  (160 cm) Weight: 72.6 kg (160 lb) IBW/kg (Calculated) : 52.4 Heparin Dosing Weight: 68kg  Vital Signs: Temp: 98 F (36.7 C) (02/24 2218) Temp Source: Oral (02/24 1813) BP: 100/54 (02/25 0100) Pulse Rate: 96 (02/25 0100)  Labs: Recent Labs    03/22/22 1837 03/22/22 2200 03/23/22 0018  HGB 11.3*  --  10.3*  HCT 36.9  --  32.6*  PLT 431*  --  365  HEPARINUNFRC  --   --  0.71*  CREATININE 0.91  --   --   TROPONINIHS 7 6  --      Estimated Creatinine Clearance: 69.9 mL/min (by C-G formula based on SCr of 0.91 mg/dL).  Assessment: 52 y.o. female with PE for heparin  Goal of Therapy:  Heparin level 0.3-0.7 units/ml Monitor platelets by anticoagulation protocol: Yes   Plan:  Continue Heparin at current rate for now Check heparin level in 8 hours and adjust at that time if needed  Phillis Knack, PharmD, BCPS  03/23/2022 1:39 AM

## 2022-03-23 NOTE — Progress Notes (Signed)
[place] for heparin Indication: pulmonary embolus Brief A/P: Heparin level slightly supratherapeutic  Continue Heparin at current rate for now  No Known Allergies  Patient Measurements: Height: '5\' 3"'$  (160 cm) Weight: 72.6 kg (160 lb) IBW/kg (Calculated) : 52.4 Heparin Dosing Weight: 68kg  Vital Signs: Temp: 98.3 F (36.8 C) (02/25 0845) Temp Source: Oral (02/25 0845) BP: 126/80 (02/25 0845) Pulse Rate: 104 (02/25 0845)  Labs: Recent Labs    03/22/22 1837 03/22/22 2200 03/23/22 0018 03/23/22 0830  HGB 11.3*  --  10.3*  --   HCT 36.9  --  32.6*  --   PLT 431*  --  365  --   HEPARINUNFRC  --   --  0.71* 0.46  CREATININE 0.91  --   --   --   TROPONINIHS 7 6  --   --     Estimated Creatinine Clearance: 69.9 mL/min (by C-G formula based on SCr of 0.91 mg/dL).  Assessment: 52 y.o. female with PE for heparin  Heparin level remains therapeutic x2 levels at 0.46 on current rate of 1150 units/hr. No bleeding reported. CBC stable.   Goal of Therapy:  Heparin level 0.3-0.7 units/ml Monitor platelets by anticoagulation protocol: Yes   Plan:  Continue Heparin at current rate for now Daily Heparin levels and CBC while on therapy.   Sloan Leiter, PharmD, BCPS, BCCCP Clinical Pharmacist Please refer to [facility] for [place] numbers 03/23/2022 9:11 AM

## 2022-03-23 NOTE — ED Notes (Signed)
Recliner brought to the pts husband  with a pillow and a blanket

## 2022-03-24 ENCOUNTER — Other Ambulatory Visit (HOSPITAL_COMMUNITY): Payer: Self-pay

## 2022-03-24 ENCOUNTER — Telehealth: Payer: Self-pay | Admitting: Acute Care

## 2022-03-24 DIAGNOSIS — I2694 Multiple subsegmental pulmonary emboli without acute cor pulmonale: Secondary | ICD-10-CM | POA: Diagnosis not present

## 2022-03-24 DIAGNOSIS — I2699 Other pulmonary embolism without acute cor pulmonale: Secondary | ICD-10-CM | POA: Diagnosis not present

## 2022-03-24 LAB — BASIC METABOLIC PANEL
Anion gap: 4 — ABNORMAL LOW (ref 5–15)
BUN: 7 mg/dL (ref 6–20)
CO2: 24 mmol/L (ref 22–32)
Calcium: 8.1 mg/dL — ABNORMAL LOW (ref 8.9–10.3)
Chloride: 109 mmol/L (ref 98–111)
Creatinine, Ser: 0.74 mg/dL (ref 0.44–1.00)
GFR, Estimated: >60 mL/min (ref >60)
Glucose, Bld: 79 mg/dL (ref 70–99)
Potassium: 4.1 mmol/L (ref 3.5–5.1)
Sodium: 137 mmol/L (ref 135–145)

## 2022-03-24 LAB — CBC
HCT: 31.8 % — ABNORMAL LOW (ref 36.0–46.0)
Hemoglobin: 9.9 g/dL — ABNORMAL LOW (ref 12.0–15.0)
MCH: 30.3 pg (ref 26.0–34.0)
MCHC: 31.1 g/dL (ref 30.0–36.0)
MCV: 97.2 fL (ref 80.0–100.0)
Platelets: 327 10*3/uL (ref 150–400)
RBC: 3.27 MIL/uL — ABNORMAL LOW (ref 3.87–5.11)
RDW: 14.7 % (ref 11.5–15.5)
WBC: 7.2 10*3/uL (ref 4.0–10.5)
nRBC: 0 % (ref 0.0–0.2)

## 2022-03-24 LAB — HEPARIN LEVEL (UNFRACTIONATED): Heparin Unfractionated: 0.53 IU/mL (ref 0.30–0.70)

## 2022-03-24 MED ORDER — METOCLOPRAMIDE HCL 5 MG PO TABS
5.0000 mg | ORAL_TABLET | Freq: Three times a day (TID) | ORAL | Status: DC
  Administered 2022-03-24 – 2022-03-26 (×8): 5 mg via ORAL
  Filled 2022-03-24 (×8): qty 1

## 2022-03-24 MED ORDER — PANTOPRAZOLE SODIUM 40 MG PO TBEC
40.0000 mg | DELAYED_RELEASE_TABLET | Freq: Two times a day (BID) | ORAL | Status: DC
  Administered 2022-03-24 – 2022-03-26 (×4): 40 mg via ORAL
  Filled 2022-03-24 (×4): qty 1

## 2022-03-24 MED ORDER — LACTATED RINGERS IV BOLUS
500.0000 mL | Freq: Once | INTRAVENOUS | Status: AC
  Administered 2022-03-24: 500 mL via INTRAVENOUS

## 2022-03-24 NOTE — Progress Notes (Signed)
PROGRESS NOTE    Janet Hensley  [id number] DOB: 1971-01-18 DOA: 03/22/2022 PCP: Default, Provider, MD   Brief Narrative: 52 year old with past medical history significant for asthma, anxiety, GERD, substance abuse, recent admission December 2023 for severe sepsis, pneumonia, ARDS requiring intubation, blood cultures positive for Rothia Mciliaginosa, urine positive for Legionella, bronchoscopy positive for Pseudomonas and stenotrophomonas, during that admission she underwent ECMO, required tracheostomy [facility].  [facility] and discharged home at the beginning of February.  Patient presents with right side chest pain, shortness of breath.  She was noted to be tachycardic.  White blood cell 11, hemoglobin 11, EKG without acute changes.  She was found to have acute PE with right upper and lower lobe.  No evidence of acute heart strain on CT.  Scattered consolidation within the left upper and lower lobe has significantly improved with increasing cavitary component within the left upper lobe.  Mild groundglass opacity with a right upper and lower lobe likely represent changes from PE.  Since she went home 3 weeks ago she has not been very active, she noticed lower extremity edema.  He continues to smoke half a pack of cigarettes daily.   Assessment & Plan:   Principal Problem:   Acute pulmonary embolism (HCC) Active Problems:   Tobacco use   Asthma, chronic   Generalized abdominal pain   History of ischemic colitis   1-Acute pulmonary embolism: Patient presented with chest pain, shortness of breath. CT angio: Acute pulmonary emboli within the right upper and lower lobe. -Continue with heparin drip.  -ECHO: normal RV function.  -Doppler: Positive for age indeterminate left post tibial and peroneal vein.  Plan to transition to oral Eliquis tomorrow if she tolerates oral.    2-Asthma: Continue with as needed Xopenex and ipratropium  3-Cavitary lung lesion left upper lobe: -Increase in size on CT -Report cough. -Monitor for fever.  -sputum culture.  -Will discuss/consult CCM  - Tobacco use: Nicotine patch   Persistent nausea and intermittent vomiting: She report black stool episodes Sigmoidoscopy showed mild inflammatory changes.  Biopsy showed focal ischemic changes.  This was thought to be related to constipation. -CT abdomen and pelvis: Decreased rectosigmoid colon wall thickening, consistent with resolving colitis. No evidence of abscess or other complication. Increased hepatic steatosis. Right nephrolithiasis. -Prior CT scan negative for gallstone -Evaluated by GI who is recommending IV Protonix, she will need to follow-up outpatient for endoscopy procedure when she can be safely be off of anticoagulation. -She vomited yesterday, plan to start Reglan with meals  Chronic Hypotension: Continue with midodrine.   Anxiety: Continue with Klonopin.    Estimated body mass index is 28.34 kg/m as calculated from the following:   Height as of this encounter: '5\' 3"'$  (1.6 m).   Weight as of this encounter: 72.6 kg.   DVT prophylaxis: heparin gtt  Code Status: Full code Family Communication: Care discussed with patient.  Disposition Plan:  Status is: Observation The patient will require care spanning > 2 midnights and should be moved to inpatient because: Management of PE, nausea vomiting.  Need to make sure patient is able to tolerate oral medication    Consultants:  GI  Procedures:  ECHO Doppler  Antimicrobials:    Subjective: She vomited yesterday.  She is afraid of eating this morning. She had a bowel movement.  We discussed CT scan results.   Objective: Vitals:   03/23/22 1218 03/23/22 1604 03/23/22 1742  03/23/22 2024  BP: 112/72 (!) 86/52 98/62 (!) 88/60  Pulse: 94 91  86  Resp: '16 17  18  '$ Temp: 98.3 F (36.8 C) 98 F (36.7  C)  (!) 97.3 F (36.3 C)  TempSrc: Oral Oral  Oral  SpO2: 100% 100%  93%  Weight:      Height:       No intake or output data in the 24 hours ending 03/24/22 1047 Filed Weights   03/22/22 1821  Weight: 72.6 kg    Examination:  General exam: NAD Respiratory system: CTA Cardiovascular system: S 1, S 2 RRR Gastrointestinal system: BS present, soft, nt Central nervous system: Alert, follows command Extremities: no edema   Data Reviewed: I have personally reviewed following labs and imaging studies  CBC: Recent Labs  Lab 03/22/22 1837 03/23/22 0018 03/24/22 0214  WBC 11.0* 9.3 7.2  NEUTROABS 6.7  --   --   HGB 11.3* 10.3* 9.9*  HCT 36.9 32.6* 31.8*  MCV 98.9 96.7 97.2  PLT 431* 365 [id number]    Basic Metabolic Panel: Recent Labs  Lab 03/22/22 1837 03/24/22 0214  NA 138 137  K 3.7 4.1  CL 105 109  CO2 23 24  GLUCOSE 159* 79  BUN 8 7  CREATININE 0.91 0.74  CALCIUM 8.3* 8.1*    GFR: Estimated Creatinine Clearance: 79.5 mL/min (by C-G formula based on SCr of 0.74 mg/dL). Liver Function Tests: Recent Labs  Lab 03/22/22 1837  AST 30  ALT 23  ALKPHOS 189*  BILITOT 0.4  PROT 6.4*  ALBUMIN 1.8*    Recent Labs  Lab 03/23/22 0900  LIPASE 30   No results for input(s): "AMMONIA" in the last 168 hours. Coagulation Profile: No results for input(s): "INR", "PROTIME" in the last 168 hours. Cardiac Enzymes: No results for input(s): "CKTOTAL", "CKMB", "CKMBINDEX", "TROPONINI" in the last 168 hours. BNP (last 3 results) No results for input(s): "PROBNP" in the last 8760 hours. HbA1C: No results for input(s): "HGBA1C" in the last 72 hours. CBG: No results for input(s): "GLUCAP" in the last 168 hours. Lipid Profile: No results for input(s): "CHOL", "HDL", "LDLCALC", "TRIG", "CHOLHDL", "LDLDIRECT" in the last 72 hours. Thyroid Function Tests: No results for input(s): "TSH", "T4TOTAL", "FREET4", "T3FREE", "THYROIDAB" in the last 72 hours. Anemia Panel: No  results for input(s): "VITAMINB12", "FOLATE", "FERRITIN", "TIBC", "IRON", "RETICCTPCT" in the last 72 hours. Sepsis Labs: No results for input(s): "PROCALCITON", "LATICACIDVEN" in the last 168 hours.  No results found for this or any previous visit (from the past 240 hour(s)).       Radiology Studies: CT ABDOMEN PELVIS W CONTRAST  Result Date: 03/23/2022 CLINICAL DATA:  Vomiting. Follow-up colitis. Acute pulmonary embolism. EXAM: CT ABDOMEN AND PELVIS WITH CONTRAST TECHNIQUE: Multidetector CT imaging of the abdomen and pelvis was performed using the standard protocol following bolus administration of intravenous contrast. RADIATION DOSE REDUCTION: This exam was performed according to the departmental dose-optimization program which includes automated exposure control, adjustment of the mA and/or kV according to patient size and/or use of iterative reconstruction technique. CONTRAST:  39m OMNIPAQUE IOHEXOL 350 MG/ML SOLN COMPARISON:  02/11/2022 FINDINGS: Lower Chest: Mild improvement in consolidation seen in the lateral left lower lobe. Hepatobiliary: No hepatic masses identified. Stable small benign-appearing cyst in anterior right hepatic lobe. Increased diffuse hepatic steatosis since prior exam. Gallbladder is unremarkable. No evidence of biliary ductal dilatation. Pancreas:  No mass or inflammatory changes. Spleen: Within normal limits in size and appearance. Adrenals/Urinary Tract: No suspicious  masses identified. A few tiny less than 5 mm right renal calculi are again seen. No evidence of ureteral calculi or hydronephrosis. Stomach/Bowel: Decreased wall thickening is seen in the rectosigmoid colon since previous study, consistent with resolving colitis. No evidence of obstruction, new or worsening inflammatory process, or abnormal fluid collections. Normal appendix visualized. Vascular/Lymphatic: No pathologically enlarged lymph nodes. No acute vascular findings. Aortic atherosclerotic  calcification incidentally noted. Reproductive:  No mass or other significant abnormality. Other:  None. Musculoskeletal:  No suspicious bone lesions identified. IMPRESSION: Decreased rectosigmoid colon wall thickening, consistent with resolving colitis. No evidence of abscess or other complication. Increased hepatic steatosis. Right nephrolithiasis. No evidence of ureteral calculi or hydronephrosis. Mild improvement in peripheral left lower lobe consolidation. Aortic Atherosclerosis (ICD10-I70.0). Electronically Signed   By: Marlaine Hind M.D.   On: 03/23/2022 17:31   ECHOCARDIOGRAM COMPLETE  Result Date: 03/23/2022    ECHOCARDIOGRAM REPORT   Patient Name:   MERIAM SEKELSKY Date of Exam: 03/23/2022 Medical Rec #:  [id number]     Height:       63.0 in Accession #:    [id number]    Weight:       160.0 lb Date of Birth:  1971/01/22     BSA:          1.759 m Patient Age:    69 years      BP:           126/80 mmHg Patient Gender: F             HR:           101 bpm. Exam Location:  Inpatient Procedure: 2D Echo, Cardiac Doppler and Color Doppler Indications:    pulmonary embolus  History:        Patient has prior history of Echocardiogram examinations, most                 recent 12/24/2021. CAD; Risk Factors:Current Smoker.  Sonographer:    Johny Chess RDCS Referring Phys: [id number] [place]  1. Left ventricular ejection fraction, by estimation, is 55 to 60%. The left ventricle has normal function. The left ventricle has no regional wall motion abnormalities. Left ventricular diastolic parameters are consistent with Grade I diastolic dysfunction (impaired relaxation).  2. Right ventricular systolic function is normal. The right ventricular size is normal. There is normal pulmonary artery systolic pressure.  3. The mitral valve is normal in structure. Trivial mitral valve regurgitation.  4. The aortic valve is tricuspid. Aortic valve regurgitation is not visualized. No aortic stenosis is present.   5. The inferior vena cava is normal in size with greater than 50% respiratory variability, suggesting right atrial pressure of 3 mmHg. Comparison(s): No significant change from prior study. FINDINGS  Left Ventricle: Left ventricular ejection fraction, by estimation, is 55 to 60%. The left ventricle has normal function. The left ventricle has no regional wall motion abnormalities. The left ventricular internal cavity size was normal in size. There is  no left ventricular hypertrophy. Left ventricular diastolic parameters are consistent with Grade I diastolic dysfunction (impaired relaxation). Right Ventricle: The right ventricular size is normal. No increase in right ventricular wall thickness. Right ventricular systolic function is normal. There is normal pulmonary artery systolic pressure. The tricuspid regurgitant velocity is 2.75 m/s, and  with an assumed right atrial pressure of 3 mmHg, the estimated right ventricular systolic pressure is Q000111Q mmHg. Left Atrium: Left atrial size was normal in size. Right Atrium: Right atrial  size was normal in size. Pericardium: There is no evidence of pericardial effusion. Mitral Valve: The mitral valve is normal in structure. Trivial mitral valve regurgitation. Tricuspid Valve: The tricuspid valve is normal in structure. Tricuspid valve regurgitation is trivial. Aortic Valve: The aortic valve is tricuspid. Aortic valve regurgitation is not visualized. No aortic stenosis is present. Pulmonic Valve: The pulmonic valve was normal in structure. Pulmonic valve regurgitation is trivial. Aorta: The aortic root and ascending aorta are structurally normal, with no evidence of dilitation. Venous: The inferior vena cava is normal in size with greater than 50% respiratory variability, suggesting right atrial pressure of 3 mmHg. IAS/Shunts: The atrial septum is grossly normal.  LEFT VENTRICLE PLAX 2D LVIDd:         3.70 cm   Diastology LVIDs:         2.70 cm   LV e' medial:    10.70 cm/s  LV PW:         1.00 cm   LV E/e' medial:  7.3 LV IVS:        0.90 cm   LV e' lateral:   12.70 cm/s LVOT diam:     2.20 cm   LV E/e' lateral: 6.1 LV SV:         70 LV SV Index:   40 LVOT Area:     3.80 cm  RIGHT VENTRICLE             IVC RV Basal diam:  2.10 cm     IVC diam: 1.10 cm RV S prime:     14.40 cm/s TAPSE (M-mode): 2.1 cm LEFT ATRIUM             Index        RIGHT ATRIUM          Index LA diam:        2.30 cm 1.31 cm/m   RA Area:     9.03 cm LA Vol (A2C):   37.0 ml 21.04 ml/m  RA Volume:   17.30 ml 9.84 ml/m LA Vol (A4C):   24.2 ml 13.76 ml/m LA Biplane Vol: 30.4 ml 17.29 ml/m  AORTIC VALVE LVOT Vmax:   98.00 cm/s LVOT Vmean:  68.700 cm/s LVOT VTI:    0.183 m  AORTA Ao Root diam: 2.80 cm Ao Asc diam:  2.90 cm MV E velocity: 78.10 cm/s   TRICUSPID VALVE MV A velocity: 113.00 cm/s  TR Peak grad:   30.2 mmHg MV E/A ratio:  0.69         TR Vmax:        275.00 cm/s                              SHUNTS                             Systemic VTI:  0.18 m                             Systemic Diam: 2.20 cm Gwyndolyn Kaufman MD Electronically signed by Gwyndolyn Kaufman MD Signature Date/Time: 03/23/2022/11:00:45 AM    Final    VAS [name] LOWER EXTREMITY VENOUS (DVT)  Result Date: 03/23/2022  Lower Venous DVT Study Patient Name:  ASHALA HINZ  Date of Exam:   03/23/2022 Medical Rec #: [id number]  Accession #:    [id number] Date of Birth: 1970/10/23      Patient Gender: F Patient Age:   49 years Exam Location[facility]  [facility] Procedure:      VAS [name] LOWER EXTREMITY VENOUS (DVT) Referring Phys: Wandra Feinstein RATHORE --------------------------------------------------------------------------------  Indications: Pulmonary embolism, and recent left calf pain.  Comparison Study: No prior study Performing Technologist: Sharion Dove RVS  Examination Guidelines: A complete evaluation includes B-mode imaging, spectral Doppler, color Doppler, and power Doppler as needed of all accessible portions of each vessel. Bilateral  testing is considered an integral part of a complete examination. Limited examinations for reoccurring indications may be performed as noted. The reflux portion of the exam is performed with the patient in reverse Trendelenburg.  +---------+---------------+---------+-----------+----------+--------------+ RIGHT    CompressibilityPhasicitySpontaneityPropertiesThrombus Aging +---------+---------------+---------+-----------+----------+--------------+ CFV      Full           Yes      Yes                                 +---------+---------------+---------+-----------+----------+--------------+ SFJ      Full                                                        +---------+---------------+---------+-----------+----------+--------------+ FV Prox  Full                                                        +---------+---------------+---------+-----------+----------+--------------+ FV Mid   Full                                                        +---------+---------------+---------+-----------+----------+--------------+ FV DistalFull                                                        +---------+---------------+---------+-----------+----------+--------------+ PFV      Full                                                        +---------+---------------+---------+-----------+----------+--------------+ POP      Full           Yes      Yes                                 +---------+---------------+---------+-----------+----------+--------------+ PTV      Full                                                        +---------+---------------+---------+-----------+----------+--------------+  PERO     Full                                                        +---------+---------------+---------+-----------+----------+--------------+   +---------+---------------+---------+-----------+----------+-----------------+ LEFT      CompressibilityPhasicitySpontaneityPropertiesThrombus Aging    +---------+---------------+---------+-----------+----------+-----------------+ CFV      Full           Yes      Yes                                    +---------+---------------+---------+-----------+----------+-----------------+ SFJ      Full                                                           +---------+---------------+---------+-----------+----------+-----------------+ FV Prox  Full                                                           +---------+---------------+---------+-----------+----------+-----------------+ FV Mid   Full                                                           +---------+---------------+---------+-----------+----------+-----------------+ FV DistalFull                                                           +---------+---------------+---------+-----------+----------+-----------------+ PFV      Full                                                           +---------+---------------+---------+-----------+----------+-----------------+ POP      Full           Yes      Yes                                    +---------+---------------+---------+-----------+----------+-----------------+ PTV      None                                         Age Indeterminate +---------+---------------+---------+-----------+----------+-----------------+ PERO     None  Age Indeterminate +---------+---------------+---------+-----------+----------+-----------------+     Summary: RIGHT: - There is no evidence of deep vein thrombosis in the lower extremity.  LEFT: - Findings consistent with age indeterminate deep vein thrombosis involving the left posterior tibial veins, and left peroneal veins.  *See table(s) above for measurements and observations.    Preliminary    CT Angio Chest PE W/Cm &/Or Wo Cm  Result Date: 03/22/2022 CLINICAL  DATA:  52 year old female with shortness of breath and chest pain. EXAM: CT ANGIOGRAPHY CHEST WITH CONTRAST TECHNIQUE: Multidetector CT imaging of the chest was performed using the standard protocol during bolus administration of intravenous contrast. Multiplanar CT image reconstructions and MIPs were obtained to evaluate the vascular anatomy. RADIATION DOSE REDUCTION: This exam was performed according to the departmental dose-optimization program which includes automated exposure control, adjustment of the mA and/or kV according to patient size and/or use of iterative reconstruction technique. CONTRAST:  68 cc intravenous Omnipaque 350 COMPARISON:  01/11/2022 and prior CTs. 03/22/2022 and prior radiographs FINDINGS: Cardiovascular: Segmental and larger vessel pulmonary emboli are noted within the RIGHT UPPER lobe. RIGHT LOWER lobe segmental pulmonary emboli are also noted. There is no CT evidence of acute heart strain. There is no evidence of thoracic aortic aneurysm or pericardial effusion. Heart size is within normal limits. Mediastinum/Nodes: No enlarged mediastinal, hilar, or axillary lymph nodes. Thyroid gland, trachea, and esophagus demonstrate no significant findings. Lungs/Pleura: Scattered consolidation within the LEFT UPPER and LOWER lobes have significantly improved, with increasing cavitary component within the LEFT UPPER lobe. Mild ground-glass opacities within the RIGHT UPPER and LOWER lobes likely represents changes from pulmonary emboli. There is no evidence of pneumothorax or pleural effusion. Upper Abdomen: No acute abnormality. Musculoskeletal: No acute or suspicious bony abnormalities are noted. Review of the MIP images confirms the above findings. IMPRESSION: 1. Acute pulmonary emboli within the RIGHT UPPER and LOWER lobes. No CT evidence of acute heart strain. 2. Scattered consolidation within the LEFT UPPER and LOWER lobes have significantly improved, with increasing cavitary component within  the LEFT UPPER lobe. 3. Mild ground-glass opacities within the RIGHT UPPER and LOWER lobes likely represents changes from pulmonary emboli. Critical Value/emergent results were called by telephone at the time of interpretation on 03/22/2022 at 8:55 pm to provider Blanchie Dessert, who verbally acknowledged these results. Electronically Signed   By: Margarette Canada M.D.   On: 03/22/2022 20:57   DG Chest 2 View  Result Date: 03/22/2022 CLINICAL DATA:  Chest pain and shortness of breath. EXAM: CHEST - 2 VIEW COMPARISON:  03/12/2022 chest radiograph and prior studies FINDINGS: The visualized cardiomediastinal silhouette is unchanged. Patchy and linear opacities within the mid-lower LEFT lung, and LEFT apical opacities with probable cavitation are not significantly changed. There is no evidence pneumothorax or large pleural effusion. No acute bony abnormalities are present. IMPRESSION: Unchanged appearance of the chest with LEFT lung opacities and probable LEFT apical cavitation. This may represent continued or residual from recent pneumonia. Radiographic follow-up to resolution is recommended. Electronically Signed   By: Margarette Canada M.D.   On: 03/22/2022 19:22        Scheduled Meds:  clonazePAM  0.5 mg Oral TID   docusate sodium  100 mg Oral BID   lubiprostone  24 mcg Oral BID WC   metoCLOPramide  5 mg Oral TID AC & HS   midodrine  10 mg Per Tube TID   nicotine  14 mg Transdermal Daily   pantoprazole  40 mg Oral BID   Continuous  Infusions:  heparin 1,150 Units/hr (03/23/22 1006)   lactated ringers       LOS: 1 day    Time spent: 35 minutes.     Elmarie Shiley, MD [facility]   If 7PM-7AM, please contact night-coverage [email or web address]  03/24/2022, 10:47 AM

## 2022-03-24 NOTE — Progress Notes (Signed)
ANTICOAGULATION CONSULT NOTE  Pharmacy Consult for heparin Indication: pulmonary embolus  No Known Allergies  Patient Measurements: Height: '5\' 3"'$  (160 cm) Weight: 72.6 kg (160 lb) IBW/kg (Calculated) : 52.4 Heparin Dosing Weight: 68kg  Vital Signs:    Labs: Recent Labs    03/22/22 1837 03/22/22 2200 03/23/22 0018 03/23/22 0830 03/24/22 0214  HGB 11.3*  --  10.3*  --  9.9*  HCT 36.9  --  32.6*  --  31.8*  PLT 431*  --  365  --  327  HEPARINUNFRC  --   --  0.71* 0.46 0.53  CREATININE 0.91  --   --   --  0.74  TROPONINIHS 7 6  --   --   --      Estimated Creatinine Clearance: 79.5 mL/min (by C-G formula based on SCr of 0.74 mg/dL).  Assessment: 52 y.o. female with acute PE to continue on IV heparin.  Heparin level therapeutic; no bleeding reported.  Goal of Therapy:  Heparin level 0.3-0.7 units/ml Monitor platelets by anticoagulation protocol: Yes   Plan:  Continue heparin infusion at 1150 units/hr Daily heparin level and CBC F/u with oral AC when able  Apple Dearmas D. Mina Marble, PharmD, BCPS, [place] 03/24/2022, 10:30 AM

## 2022-03-24 NOTE — Consult Note (Signed)
NAME:  Janet Hensley, MRN:  [id number], DOB:  07/19/1970, LOS: 1 ADMISSION DATE:  03/22/2022, CONSULTATION DATE:  03/24/2022 REFERRING MD:  Dr. Tyrell Antonio, CHIEF COMPLAINT: Abnormal CT  History of Present Illness:  Janet Hensley is a 52 year old female with a past medical history significant for asthma, GERD, anxiety, substance abuse and tobacco abuse, who of note was recently hospitalized for prolonged stay at this facility due to severe septic shock with ARDS requiring prolonged mechanical ventilation and ECMO support.  During hospitalization she was also bacteremic in with blood cultures positive for Rothia Mciliaginosa.  Patient eventually underwent tracheostomy for prolonged ventilator wean and was discharged to [facility] 01/08/22.   Patient presented to ED 2/24 for complaints of complaints of right-sided chest pain with shortness of breath.  Workup on ED arrival revealed CTA chest that was positive for PE in the right upper and lower lobes without evidence of heart strain.  CT additionally revealed scattered consolidation within the left upper and lower lobes significantly improved but increased cavitary component to the left upper lobe.  PCCM consulted for assistance in managing abnormal CT  Pertinent  Medical History  Asthma, anxiety, GERD, substance abuse  Significant Hospital Events: Including procedures, antibiotic start and stop dates in addition to other pertinent events   2/25 presented with complaints of right-sided chest pain and shortness of breath 2/26 PCCM consulted  Interim History / Subjective:  Seen sitting up in bed with no acute complaints. Denies any fever, shortness of breath or change in sputum.   Objective   Blood pressure (!) 88/60, pulse 86, temperature (!) 97.3 F (36.3 C), temperature source Oral, resp. rate 18, height '5\' 3"'$  (1.6 m), weight 72.6 kg, last menstrual period 01/16/2016, SpO2 93 %.       No intake or output data in the 24 hours ending  03/24/22 1340 Filed Weights   03/22/22 1821  Weight: 72.6 kg    Examination: General: Well appearing middle age female sitting up in bed in NAD HEENT: [place]/AT, MM pink/moist, PERRL,  Neuro: Alert and oriented, x3, non-focal  CV: s1s2 regular rate and rhythm, no murmur, rubs, or gallops,  PULM:  Clear to auscultation, no increased work of breathing, no added breath sounds, on RA GI: soft, bowel sounds active in all 4 quadrants, non-tender, non-distended, tolerating oral diet Extremities: warm/dry, no edema  Skin: no rashes or lesions  Resolved Hospital Problem list     Assessment & Plan:  Cavitary lung lesion left upper lobe -No change in sputum production, remains on RA, no dyspnea. Low suspicion for acute bacterial infection  Acute pulmonary embolism without evidence of right heart strain Asthma Daily tobacco abuse -States desire to formally quit  P: Continue Heparin drip, can likely transition to PO anticoagulation tomorrow No need for antibiotics currently Encourage pulmonary hygiene Mobilize as able  Will contact clinic today to set patient up with pulmonary follow up   Best Practice (right click and "Reselect all SmartList Selections" daily)  Per primary  Labs   CBC: Recent Labs  Lab 03/22/22 1837 03/23/22 0018 03/24/22 0214  WBC 11.0* 9.3 7.2  NEUTROABS 6.7  --   --   HGB 11.3* 10.3* 9.9*  HCT 36.9 32.6* 31.8*  MCV 98.9 96.7 97.2  PLT 431* 365 [id number]    Basic Metabolic Panel: Recent Labs  Lab 03/22/22 1837 03/24/22 0214  NA 138 137  K 3.7 4.1  CL 105 109  CO2 23 24  GLUCOSE 159* 79  BUN 8 7  CREATININE 0.91 0.74  CALCIUM 8.3* 8.1*   GFR: Estimated Creatinine Clearance: 79.5 mL/min (by C-G formula based on SCr of 0.74 mg/dL). Recent Labs  Lab 03/22/22 1837 03/23/22 0018 03/24/22 0214  WBC 11.0* 9.3 7.2    Liver Function Tests: Recent Labs  Lab 03/22/22 1837  AST 30  ALT 23  ALKPHOS 189*  BILITOT 0.4  PROT 6.4*  ALBUMIN 1.8*    Recent Labs  Lab 03/23/22 0900  LIPASE 30   No results for input(s): "AMMONIA" in the last 168 hours.  ABG    Component Value Date/Time   PHART 7.41 01/24/2022 1600   PCO2ART 39 01/24/2022 1600   PO2ART 82 (L) 01/24/2022 1600   HCO3 24.7 01/24/2022 1600   TCO2 27 01/02/2022 1053   ACIDBASEDEF 2.0 12/29/2021 0334   O2SAT 96.6 01/24/2022 1600     Coagulation Profile: No results for input(s): "INR", "PROTIME" in the last 168 hours.  Cardiac Enzymes: No results for input(s): "CKTOTAL", "CKMB", "CKMBINDEX", "TROPONINI" in the last 168 hours.  HbA1C: Hgb A1c MFr Bld  Date/Time Value Ref Range Status  01/08/2022 05:00 AM 6.1 (H) 4.8 - 5.6 % Final    Comment:    (NOTE)         Prediabetes: 5.7 - 6.4         Diabetes: >6.4         Glycemic control for adults with diabetes: <7.0   12/25/2021 03:49 AM 5.8 (H) 4.8 - 5.6 % Final    Comment:    (NOTE)         Prediabetes: 5.7 - 6.4         Diabetes: >6.4         Glycemic control for adults with diabetes: <7.0     CBG: No results for input(s): "GLUCAP" in the last 168 hours.  Review of Systems:   Please see the history of present illness. All other systems reviewed and are negative   Past Medical History:  She,  has a past medical history of Asthma (01/15/2012).   Surgical History:   Past Surgical History:  Procedure Laterality Date   BIOPSY  02/13/2022   Procedure: BIOPSY;  Surgeon: Yetta Flock, MD;  Location: [facility];  Service: Gastroenterology;;   Otho Darner SIGMOIDOSCOPY N/A 02/13/2022   Procedure: Beryle Quant;  Surgeon: Yetta Flock, MD;  Location: [facility];  Service: Gastroenterology;  Laterality: N/A;   LEFT HEART CATH AND CORONARY ANGIOGRAPHY N/A 12/24/2021   Procedure: LEFT HEART CATH AND CORONARY ANGIOGRAPHY;  Surgeon: Nelva Bush, MD;  Location: [facility];  Service: Cardiovascular;  Laterality: N/A;     Social History:   reports that she has been  smoking cigarettes. She does not have any smokeless tobacco history on file.   Family History:  Her family history is not on file.   Allergies No Known Allergies   Home Medications  Prior to Admission medications   Medication Sig Start Date End Date Taking? Authorizing Provider  alum & mag hydroxide-simeth (MAALOX PLUS) 400-400-40 MG/5ML suspension Take 5 mLs by mouth 3 (three) times daily before meals. 03/12/22  Yes Durel Salts C, DO  clonazePAM (KLONOPIN) 0.5 MG tablet Take 1 tablet (0.5 mg total) by mouth 3 (three) times daily. 02/27/22  Yes Love, Ivan Anchors, PA-C  docusate sodium (COLACE) 100 MG capsule Take 1 capsule (100 mg total) by mouth 2 (two) times daily. 02/27/22  Yes Love, Ivan Anchors, PA-C  famotidine (  PEPCID) 20 MG tablet Place 1 tablet (20 mg total) into feeding tube 2 (two) times daily. 02/27/22  Yes Love, Ivan Anchors, PA-C  ipratropium-albuterol (DUONEB) 0.5-2.5 (3) MG/3ML SOLN Take 3 mLs by nebulization every 4 (four) hours as needed. Patient taking differently: Take 3 mLs by nebulization every 4 (four) hours as needed (sob/wheezing). 01/08/22  Yes Mick Sell, PA-C  lubiprostone (AMITIZA) 24 MCG capsule Take 1 capsule (24 mcg total) by mouth 2 (two) times daily with a meal. 02/27/22  Yes Love, Ivan Anchors, PA-C  melatonin 3 MG TABS tablet Take 1 tablet (3 mg total) by mouth at bedtime. Patient taking differently: Take 3 mg by mouth at bedtime as needed (sleep). 02/27/22  Yes Love, Ivan Anchors, PA-C  midodrine (PROAMATINE) 10 MG tablet Place 1 tablet (10 mg total) into feeding tube 3 (three) times daily. 02/27/22  Yes Love, Ivan Anchors, PA-C  oxyCODONE-acetaminophen (PERCOCET) 10-325 MG tablet Take 1 tablet by mouth every 6 (six) hours as needed for pain.   Yes [provider]  polyethylene glycol powder (GLYCOLAX/MIRALAX) 17 GM/SCOOP powder Take 1 capful with water (17 g) by mouth daily. Patient taking differently: Take 17 g by mouth daily as needed for moderate constipation. 02/28/22  Yes  Love, Ivan Anchors, PA-C  potassium chloride SA (KLOR-CON M) 20 MEQ tablet Take 2 tablets (40 mEq total) by mouth 2 (two) times daily. 02/27/22  Yes Love, Ivan Anchors, PA-C  senna (SENOKOT) 8.6 MG TABS tablet Take 1 tablet (8.6 mg total) by mouth at bedtime. Patient taking differently: Take 1 tablet by mouth at bedtime as needed for moderate constipation. 02/27/22  Yes Love, Ivan Anchors, PA-C  acetaminophen (TYLENOL) 160 MG/5ML solution Take 20.3 mLs (650 mg total) by mouth every 6 (six) hours. 01/08/22   Mick Sell, PA-C     Critical care time: NA  Kathren Scearce D. Harris, NP-C [facility] Personal contact information can be found on Amion  If no contact or response made please call [phone] 03/24/2022, 3:26 PM

## 2022-03-24 NOTE — TOC CM/SW Note (Addendum)
Entered benefit check for  co pay for eliquis, [place] already listed

## 2022-03-24 NOTE — TOC Benefit Eligibility Note (Signed)
Patient Teacher, English as a foreign language completed.    The patient is currently admitted and upon discharge could be taking Eliquis 5 mg.  The current 30 day co-pay is $4.00.   The patient is currently admitted and upon discharge could be taking Xarelto 20 mg.  The current 30 day co-pay is $4.00.   The patient is insured through [place] [place] Patient Advocate Specialist [place] Patient Advocate Team Direct Number: [phone]  Fax: [phone]

## 2022-03-25 ENCOUNTER — Institutional Professional Consult (permissible substitution): Payer: Disability Insurance | Admitting: Student in an Organized Health Care Education/Training Program

## 2022-03-25 DIAGNOSIS — I2694 Multiple subsegmental pulmonary emboli without acute cor pulmonale: Secondary | ICD-10-CM | POA: Diagnosis not present

## 2022-03-25 LAB — CBC
HCT: 30.2 % — ABNORMAL LOW (ref 36.0–46.0)
Hemoglobin: 9.6 g/dL — ABNORMAL LOW (ref 12.0–15.0)
MCH: 30.4 pg (ref 26.0–34.0)
MCHC: 31.8 g/dL (ref 30.0–36.0)
MCV: 95.6 fL (ref 80.0–100.0)
Platelets: 346 10*3/uL (ref 150–400)
RBC: 3.16 MIL/uL — ABNORMAL LOW (ref 3.87–5.11)
RDW: 14.7 % (ref 11.5–15.5)
WBC: 7.9 10*3/uL (ref 4.0–10.5)
nRBC: 0 % (ref 0.0–0.2)

## 2022-03-25 LAB — HEPARIN LEVEL (UNFRACTIONATED): Heparin Unfractionated: 0.63 IU/mL (ref 0.30–0.70)

## 2022-03-25 MED ORDER — APIXABAN 5 MG PO TABS
10.0000 mg | ORAL_TABLET | Freq: Two times a day (BID) | ORAL | Status: DC
  Administered 2022-03-25 – 2022-03-26 (×3): 10 mg via ORAL
  Filled 2022-03-25 (×3): qty 2

## 2022-03-25 MED ORDER — APIXABAN 5 MG PO TABS: 5.0000 mg | ORAL_TABLET | Freq: Two times a day (BID) | ORAL | Status: DC

## 2022-03-25 MED ORDER — MIDODRINE HCL 5 MG PO TABS
10.0000 mg | ORAL_TABLET | Freq: Three times a day (TID) | ORAL | Status: DC
  Administered 2022-03-25 – 2022-03-26 (×3): 10 mg via ORAL
  Filled 2022-03-25 (×3): qty 2

## 2022-03-25 NOTE — Progress Notes (Signed)
PROGRESS NOTE    Janet Hensley  [id number] DOB: 29-Nov-1970 DOA: 03/22/2022 PCP: Default, Provider, MD   Brief Narrative: 52 year old with past medical history significant for asthma, anxiety, GERD, substance abuse, recent admission December 2023 for severe sepsis, pneumonia, ARDS requiring intubation, blood cultures positive for Rothia Mciliaginosa, urine positive for Legionella, bronchoscopy positive for Pseudomonas and stenotrophomonas, during that admission she underwent ECMO, required tracheostomy [facility].  [facility]went [facility]ann[facility] and discharged home at the beginning of February.  Patient presents with right side chest pain, shortness of breath.  She was noted to be tachycardic.  White blood cell 11, hemoglobin 11, EKG without acute changes.  She was found to have acute PE with right upper and lower lobe.  No evidence of acute heart strain on CT.  Scattered consolidation within the left upper and lower lobe has significantly improved with increasing cavitary component within the left upper lobe.  Mild groundglass opacity with a right upper and lower lobe likely represent changes from PE.  Since she went home 3 weeks ago she has not been very active, she noticed lower extremity edema.  He continues to smoke half a pack of cigarettes daily.   Assessment & Plan:   Principal Problem:   Acute pulmonary embolism (HCC) Active Problems:   Tobacco use   Asthma, chronic   Generalized abdominal pain   History of ischemic colitis   1-Acute pulmonary embolism: -Patient presented with chest pain, shortness of breath. -CT angio: Acute pulmonary emboli within the right upper and lower lobe. -Continue with heparin drip.  -ECHO: normal RV function.  -Doppler: Positive for age indeterminate left post tibial and peroneal vein.  -Plan to transition to oral Eliquis today. No further vomiting today.  If patient  start vomiting, might need to transition back to heparin gtt or lovenox.   2-Asthma: Continue with as needed Xopenex and ipratropium  3-Cavitary lung lesion left upper lobe: -Increase in size on CT -Report cough. -Monitor for fever.  -sputum culture.  -Appreciate Pulmonologist evaluation.  No need to start antibiotics, unless develops fever, worsening cough.  Plan to repeat CT chest 4-or 6 months.  Follow up with pulmonologist in 2-3 weeks.   - Tobacco use: Nicotine patch   Persistent nausea and intermittent vomiting: She report black stool episodes Sigmoidoscopy showed mild inflammatory changes.  Biopsy showed focal ischemic changes.  This was thought to be related to constipation. -CT abdomen and pelvis: Decreased rectosigmoid colon wall thickening, consistent with resolving colitis. No evidence of abscess or other complication. Increased hepatic steatosis. Right nephrolithiasis. -Prior CT scan negative for gallstone -Evaluated by GI who is recommending IV Protonix, she will need to follow-up outpatient for endoscopy procedure when she can be safely be off of anticoagulation. -started Reglan with meals. Vomited yesterday.  Per nurse report no further vomiting today.    Chronic Hypotension: Continue with midodrine.   Anxiety: Continue with Klonopin.    Estimated body mass index is 28.34 kg/m as calculated from the following:   Height as of this encounter: '5\' 3"'$  (1.6 m).   Weight as of this encounter: 72.6 kg.   DVT prophylaxis: heparin gtt  Code Status: Full code Family Communication: Care discussed with patient.  Disposition Plan:  Status is: Observation The patient will require care spanning > 2 midnights and should be moved to inpatient because: Management of PE, nausea vomiting.  Need to make sure patient is able to tolerate oral medication  Consultants:  GI  Procedures:  ECHO Doppler  Antimicrobials:    Subjective: She vomited twice yesterday.  Abdominal pain better today. '  Objective: Vitals:   03/24/22 1520 03/24/22 1944 03/25/22 0609 03/25/22 0752  BP: (!) 91/43 (!) 95/57 (!) 91/55 (!) 92/54  Pulse: 97 95 83 82  Resp: '16 16  18  '$ Temp: 98.2 F (36.8 C) 98.3 F (36.8 C) 98.4 F (36.9 C) 98.1 F (36.7 C)  TempSrc: Oral Oral  Oral  SpO2: 99% 99% 100% 94%  Weight:      Height:        Intake/Output Summary (Last 24 hours) at 03/25/2022 1406 Last data filed at 03/25/2022 1100 Gross per 24 hour  Intake 740.07 ml  Output --  Net 740.07 ml   Filed Weights   03/22/22 1821  Weight: 72.6 kg    Examination:  General exam: NAD Respiratory system: CTA Cardiovascular system: S 1, S 2 RRR Gastrointestinal system: BS present, soft, nt Central nervous system: Alert, follows command  Extremities: no edema   Data Reviewed: I have personally reviewed following labs and imaging studies  CBC: Recent Labs  Lab 03/22/22 1837 03/23/22 0018 03/24/22 0214 03/25/22 0258  WBC 11.0* 9.3 7.2 7.9  NEUTROABS 6.7  --   --   --   HGB 11.3* 10.3* 9.9* 9.6*  HCT 36.9 32.6* 31.8* 30.2*  MCV 98.9 96.7 97.2 95.6  PLT 431* 365 327 [id number]    Basic Metabolic Panel: Recent Labs  Lab 03/22/22 1837 03/24/22 0214  NA 138 137  K 3.7 4.1  CL 105 109  CO2 23 24  GLUCOSE 159* 79  BUN 8 7  CREATININE 0.91 0.74  CALCIUM 8.3* 8.1*    GFR: Estimated Creatinine Clearance: 79.5 mL/min (by C-G formula based on SCr of 0.74 mg/dL). Liver Function Tests: Recent Labs  Lab 03/22/22 1837  AST 30  ALT 23  ALKPHOS 189*  BILITOT 0.4  PROT 6.4*  ALBUMIN 1.8*    Recent Labs  Lab 03/23/22 0900  LIPASE 30    No results for input(s): "AMMONIA" in the last 168 hours. Coagulation Profile: No results for input(s): "INR", "PROTIME" in the last 168 hours. Cardiac Enzymes: No results for input(s): "CKTOTAL", "CKMB", "CKMBINDEX", "TROPONINI" in the last 168 hours. BNP (last 3 results) No results for input(s): "PROBNP" in the last 8760  hours. HbA1C: No results for input(s): "HGBA1C" in the last 72 hours. CBG: No results for input(s): "GLUCAP" in the last 168 hours. Lipid Profile: No results for input(s): "CHOL", "HDL", "LDLCALC", "TRIG", "CHOLHDL", "LDLDIRECT" in the last 72 hours. Thyroid Function Tests: No results for input(s): "TSH", "T4TOTAL", "FREET4", "T3FREE", "THYROIDAB" in the last 72 hours. Anemia Panel: No results for input(s): "VITAMINB12", "FOLATE", "FERRITIN", "TIBC", "IRON", "RETICCTPCT" in the last 72 hours. Sepsis Labs: No results for input(s): "PROCALCITON", "LATICACIDVEN" in the last 168 hours.  No results found for this or any previous visit (from the past 240 hour(s)).       Radiology Studies: CT ABDOMEN PELVIS W CONTRAST  Result Date: 03/23/2022 CLINICAL DATA:  Vomiting. Follow-up colitis. Acute pulmonary embolism. EXAM: CT ABDOMEN AND PELVIS WITH CONTRAST TECHNIQUE: Multidetector CT imaging of the abdomen and pelvis was performed using the standard protocol following bolus administration of intravenous contrast. RADIATION DOSE REDUCTION: This exam was performed according to the departmental dose-optimization program which includes automated exposure control, adjustment of the mA and/or kV according to patient size and/or use of iterative reconstruction technique. CONTRAST:  73m OMNIPAQUE  IOHEXOL 350 MG/ML SOLN COMPARISON:  02/11/2022 FINDINGS: Lower Chest: Mild improvement in consolidation seen in the lateral left lower lobe. Hepatobiliary: No hepatic masses identified. Stable small benign-appearing cyst in anterior right hepatic lobe. Increased diffuse hepatic steatosis since prior exam. Gallbladder is unremarkable. No evidence of biliary ductal dilatation. Pancreas:  No mass or inflammatory changes. Spleen: Within normal limits in size and appearance. Adrenals/Urinary Tract: No suspicious masses identified. A few tiny less than 5 mm right renal calculi are again seen. No evidence of ureteral calculi  or hydronephrosis. Stomach/Bowel: Decreased wall thickening is seen in the rectosigmoid colon since previous study, consistent with resolving colitis. No evidence of obstruction, new or worsening inflammatory process, or abnormal fluid collections. Normal appendix visualized. Vascular/Lymphatic: No pathologically enlarged lymph nodes. No acute vascular findings. Aortic atherosclerotic calcification incidentally noted. Reproductive:  No mass or other significant abnormality. Other:  None. Musculoskeletal:  No suspicious bone lesions identified. IMPRESSION: Decreased rectosigmoid colon wall thickening, consistent with resolving colitis. No evidence of abscess or other complication. Increased hepatic steatosis. Right nephrolithiasis. No evidence of ureteral calculi or hydronephrosis. Mild improvement in peripheral left lower lobe consolidation. Aortic Atherosclerosis (ICD10-I70.0). Electronically Signed   By: Marlaine Hind M.D.   On: 03/23/2022 17:31        Scheduled Meds:  clonazePAM  0.5 mg Oral TID   docusate sodium  100 mg Oral BID   lubiprostone  24 mcg Oral BID WC   metoCLOPramide  5 mg Oral TID AC & HS   midodrine  10 mg Per Tube TID   nicotine  14 mg Transdermal Daily   pantoprazole  40 mg Oral BID   Continuous Infusions:  heparin 1,100 Units/hr (03/25/22 0940)     LOS: 2 days    Time spent: 35 minutes.     Elmarie Shiley, MD [facility]   If 7PM-7AM, please contact night-coverage [email or web address]  03/25/2022, 2:06 PM

## 2022-03-25 NOTE — Discharge Instructions (Signed)
Information on my medicine - ELIQUIS (apixaban)  This medication education was reviewed with me or my healthcare representative as part of my discharge preparation.   Why was Eliquis prescribed for you? Eliquis was prescribed to treat blood clots that may have been found in the veins of your legs (deep vein thrombosis) or in your lungs (pulmonary embolism) and to reduce the risk of them occurring again.  What do You need to know about Eliquis ? The starting dose is 10 mg (two 5 mg tablets) taken TWICE daily for the FIRST SEVEN (7) DAYS, then on 04/01/22  the dose is reduced to ONE 5 mg tablet taken TWICE daily.  Eliquis may be taken with or without food.   Try to take the dose about the same time in the morning and in the evening. If you have difficulty swallowing the tablet whole please discuss with your pharmacist how to take the medication safely.  Take Eliquis exactly as prescribed and DO NOT stop taking Eliquis without talking to the doctor who prescribed the medication.  Stopping may increase your risk of developing a new blood clot.  Refill your prescription before you run out.  After discharge, you should have regular check-up appointments with your healthcare provider that is prescribing your Eliquis.    What do you do if you miss a dose? If a dose of ELIQUIS is not taken at the scheduled time, take it as soon as possible on the same day and twice-daily administration should be resumed. The dose should not be doubled to make up for a missed dose.  Important Safety Information A possible side effect of Eliquis is bleeding. You should call your healthcare provider right away if you experience any of the following: Bleeding from an injury or your nose that does not stop. Unusual colored urine (red or dark brown) or unusual colored stools (red or black). Unusual bruising for unknown reasons. A serious fall or if you hit your head (even if there is no bleeding).  Some medicines  may interact with Eliquis and might increase your risk of bleeding or clotting while on Eliquis. To help avoid this, consult your healthcare provider or pharmacist prior to using any new prescription or non-prescription medications, including herbals, vitamins, non-steroidal anti-inflammatory drugs (NSAIDs) and supplements.  This website has more information on Eliquis (apixaban): http://www.eliquis.com/eliquis/home

## 2022-03-25 NOTE — TOC Initial Note (Signed)
Transition of Care (TOC) - Initial/Assessment Note   Spoke to patient at bedside . PCP is at [facility]   Patient lives with brother. Her husband comes and helps her as much as he can.   Patient has walker, wheelchair and shower bench at home.   Eliquis and Eldridge Abrah[employer] co pay for each is $4.00 for 30 day supply.   Patient would like to use [facility].    Patient Details  Name: Janet Hensley MRN: [id number] Date of Birth: 04/13/1970  Transition of Care [facility]) CM/SW Contact:    Marilu Favre, RN Phone Number: 03/25/2022, 11:56 AM  Clinical Narrative:                   Expected Discharge Plan: Home/Self Care Barriers to Discharge: Continued Medical Work up   Patient Goals and CMS Choice Patient states their goals for this hospitalization and ongoing recovery are:: to return to home     [place] ownership interest in [facility].provided to:: Patient    Expected Discharge Plan and Services   Discharge Planning Services: CM Consult Post Acute Care Choice: NA Living arrangements for the past 2 months: Single Family Home                 DME Arranged: N/A DME Agency: NA       HH Arranged: NA          Prior Living Arrangements/Services Living arrangements for the past 2 months: Single Family Home Lives with:: Siblings Patient language and need for interpreter reviewed:: Yes Do you feel safe going back to the place where you live?: Yes      Need for Family Participation in Patient Care: Yes (Comment) Care giver support system in place?: Yes (comment) Current home services: DME Criminal Activity/Legal Involvement Pertinent to Current Situation/Hospitalization: No - Comment as needed  Activities of Daily Living Home Assistive Devices/Equipment: None ADL Screening (condition at time of admission) Patient's cognitive ability adequate to safely complete daily activities?: Yes Is the patient deaf or have difficulty hearing?: No Does the patient have  difficulty seeing, even when wearing glasses/contacts?: No Does the patient have difficulty concentrating, remembering, or making decisions?: No Patient able to express need for assistance with ADLs?: Yes Does the patient have difficulty dressing or bathing?: No Independently performs ADLs?: Yes (appropriate for developmental age) Does the patient have difficulty walking or climbing stairs?: No Weakness of Legs: None Weakness of Arms/Hands: None  Permission Sought/Granted   Permission granted to share information with : No              Emotional Assessment Appearance:: Appears stated age Attitude/Demeanor/Rapport: Engaged Affect (typically observed): Accepting Orientation: : Oriented to Self, Oriented to Place, Oriented to  Time, Oriented to Situation Alcohol / Substance Use: Not Applicable Psych Involvement: No (comment)  Admission diagnosis:  Acute pulmonary embolism (HCC) [I26.99] Multiple subsegmental pulmonary emboli without acute cor pulmonale (HCC) [I26.94] Patient Active Problem List   Diagnosis Date Noted   Generalized abdominal pain 03/23/2022   History of ischemic colitis 03/23/2022   Acute pulmonary embolism ([place]) 03/22/2022   Shortness of breath 03/15/2022   Adjustment disorder with anxious mood 02/21/2022   Abnormal CT scan, gastrointestinal tract 02/12/2022   Lower abdominal pain 02/11/2022   Rectal bleeding 02/11/2022   Anemia 02/11/2022   Nausea and vomiting 02/11/2022   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   Chronic pain syndrome 01/09/2022   Septic shock ([place]) 01/07/2022   Acute on  chronic respiratory failure with hypoxia ([place]) 01/06/2022   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   Hypernatremia 01/06/2022   ARDS (adult respiratory distress syndrome) ([place]) 12/28/2021   Pneumonia of left lung due to Pseudomonas species ([place]) 12/27/2021   AKI (acute kidney injury) ([place]) 12/27/2021   Adenopathy 12/25/2021   Coronary artery disease involving native  coronary artery of native heart without angina pectoris 12/25/2021   ST elevation myocardial infarction (STEMI) ([place]) 12/24/2021   Respiratory distress 12/22/2021   Acute respiratory failure with hypoxia and hypercapnia ([place]) 12/22/2021   Abnormal EKG 12/22/2021   Electrolyte abnormality 12/22/2021   Asthma, chronic 01/15/2012   Tobacco use 01/15/2012   PCP:  Default, Provider, MD Pharmacy:   [facility] [address] [place] [address] Phone: [phone] Fax: [place], [place] - [address] [address] [address] [place] [address] Phone: [phone] Fax: [phone]     Social Determinants of Health (SDOH) Social History: [place]: No Food Insecurity (03/23/2022)  Housing: Low Risk  (03/23/2022)  Transportation Needs: No Transportation Needs (03/23/2022)  Utilities: Not At Risk (03/23/2022)  Tobacco Use: High Risk (03/22/2022)   SDOH Interventions:     Readmission Risk Interventions     No data to display

## 2022-03-25 NOTE — Progress Notes (Addendum)
ANTICOAGULATION CONSULT NOTE  Pharmacy Consult for heparin Indication: pulmonary embolus  No Known Allergies  Patient Measurements: Height: '5\' 3"'$  (160 cm) Weight: 72.6 kg (160 lb) IBW/kg (Calculated) : 52.4 Heparin Dosing Weight: 68kg  Vital Signs: Temp: 98.1 F (36.7 C) (02/27 0752) Temp Source: Oral (02/27 0752) BP: 92/54 (02/27 0752) Pulse Rate: 82 (02/27 0752)  Labs: Recent Labs    03/22/22 1837 03/22/22 1837 03/22/22 2200 03/23/22 0018 03/23/22 0830 03/24/22 0214 03/25/22 0258  HGB 11.3*  --   --  10.3*  --  9.9* 9.6*  HCT 36.9  --   --  32.6*  --  31.8* 30.2*  PLT 431*  --   --  365  --  327 346  HEPARINUNFRC  --    < >  --  0.71* 0.46 0.53 0.63  CREATININE 0.91  --   --   --   --  0.74  --   TROPONINIHS 7  --  6  --   --   --   --    < > = values in this interval not displayed.     Estimated Creatinine Clearance: 79.5 mL/min (by C-G formula based on SCr of 0.74 mg/dL).  Assessment: 52 y.o. female with acute PE to continue on IV heparin.  Heparin level therapeutic and trending up; no bleeding reported.  Goal of Therapy:  Heparin level 0.3-0.7 units/ml Monitor platelets by anticoagulation protocol: Yes   Plan:  Reduce heparin infusion to 1100 units/hr Daily heparin level and CBC F/u with oral AC when able  Denyse Fillion D. Mina Marble, PharmD, BCPS, [place] 03/25/2022, 8:45 AM  ==============================  Addendum: Switch patient to Eliquis Eliquis '10mg'$  PO BID x 7 days, then on 3/5 start '5mg'$  PO BID Pharmacy will sign off and follow peripherally.  Thank you for the consult!  Andrus Sharp D. Mina Marble, PharmD, BCPS, [place] 03/25/2022, 2:10 PM

## 2022-03-26 ENCOUNTER — Other Ambulatory Visit (HOSPITAL_COMMUNITY): Payer: Self-pay

## 2022-03-26 DIAGNOSIS — I2699 Other pulmonary embolism without acute cor pulmonale: Secondary | ICD-10-CM | POA: Diagnosis not present

## 2022-03-26 MED ORDER — PANTOPRAZOLE SODIUM 40 MG PO TBEC
40.0000 mg | DELAYED_RELEASE_TABLET | Freq: Two times a day (BID) | ORAL | 2 refills | Status: DC
  Filled 2022-03-26: qty 60, 30d supply, fill #0

## 2022-03-26 MED ORDER — FAMOTIDINE 20 MG PO TABS: 20.0000 mg | ORAL_TABLET | Freq: Two times a day (BID) | ORAL | 0 refills | Status: DC

## 2022-03-26 MED ORDER — ONDANSETRON HCL 4 MG PO TABS
4.0000 mg | ORAL_TABLET | Freq: Every day | ORAL | 1 refills | Status: AC | PRN
  Filled 2022-03-26: qty 30, 30d supply, fill #0

## 2022-03-26 MED ORDER — ELIQUIS DVT/PE STARTER PACK 5 MG PO TBPK
ORAL_TABLET | ORAL | 0 refills | Status: DC
  Filled 2022-03-26: qty 74, 30d supply, fill #0

## 2022-03-26 MED ORDER — MIDODRINE HCL 10 MG PO TABS: 10.0000 mg | ORAL_TABLET | Freq: Three times a day (TID) | ORAL | 0 refills | Status: DC

## 2022-03-26 NOTE — Discharge Summary (Signed)
Physician Discharge Summary  Janet Hensley [id number] DOB: [birth year] DOA: 03/22/2022  PCP: Default, Provider, MD  Admit date: 03/22/2022 Discharge date: 03/26/2022  Admitted From: Home Disposition: Home  Recommendations for Outpatient Follow-up:  Follow up with PCP in 1-2 weeks Follow-up with pulmonology, Dr. Genia Harold as scheduled on 04/08/2022 Follow-up with Dr. Havery Moros as scheduled on 04/09/2022 Started on Eliquis for acute PE; will need prescription refills after starter pack completed Started on Protonix 40 mg p.o. twice daily per GI recommendations Will need repeat CT chest 4-6 months for interval surveillance of left upper lobe cavitary lesion Continue to encourage tobacco cessation  Home Health: No Equipment/Devices: None  Discharge Condition: Stable CODE STATUS: Full code Diet recommendation: Heart healthy diet  History of present illness:  Janet Hensley is a 52 year old female with past medical history significant for asthma, anxiety, GERD, substance abuse, tobacco use disorder who presented to [facility] emergency department on 03/22/2022 with right-sided chest pain, nonradiating described as tightness with associated shortness of breath.  Recently hospitalized December 2023 for severe sepsis, pneumonia, ARDS requiring intubation with blood cultures being positive for Rothia Mciliaginosa, urine culture positive for Legionella, bronchoscopy positive for Pseudomonas and stenotrophomonas.  Transferred from [facility] to [facility] for ECMO.  Unable to wean off vent and required tracheostomy.  [facility].  [facility] and discharged home at the beginning of February 2024.  After returning home, she reports has not been very active and a few days prior to admission she noticed her left calf was painful when ambulating.  Denies fever/cough.  No prior history of blood clots.  Denies recent travel  and no family history of blood clotting disorders.  She does endorse tobacco use 1/2 pack of cigarettes daily over the past 30 years.  In the ED, patient was noted be tachycardic with heart rates in the 120s, afebrile, not tachypneic/hypoxic/hypotensive.  WBC 11.0, hemoglobin 11.3, platelet count 431, glucose 159, calcium 8.3, albumin 1.8, alkaline phosphatase 29.  LFTs/T bilirubin within normal limits.  Troponin normal, BNP normal.  EKG without acute ischemic changes.  CT angiogram chest with acute pulmonary emboli within the right upper/lower lobes with no CT evidence of acute heart strain.  Patient was started on IV heparin.  TRH consulted for admission for further evaluation management of acute pulmonary embolism.  Hospital course:  Acute pulmonary embolism Age-indeterminate DVT left lower extremity Patient presenting to ED with right-sided chest pain that was nonradiating and was noted to be tachycardic.  CT angiogram chest on admission with acute pulmonary embolism within the right upper/lower lobes with no evidence of acute heart strain on CT.  Patient was initially started on IV heparin.  Vascular duplex ultrasound bilateral lower extremities with findings consistent with age-indeterminate DVT left posterior tibial veins and left peroneal veins, no DVT right lower extremity.  Underwent transthoracic echocardiogram with findings of LVEF 55-3% without LV regional wall motion abnormalities, grade 1 diastolic dysfunction, RV function within normal limits.  Patient was transition to Eliquis.  Etiology likely secondary to prolonged hospitalization with immobility.  Also risk factors of tobacco use.  Outpatient follow-up with PCP/pulmonology for medication refills.  Cavitary lesion left upper lobe CT angiogram chest on admission with findings of scattered consolidation with the left upper/lower lobes which is significantly improved but increased cavitary component of the left upper lobe.  PCCM was  consulted.  Given no change in sputum production, oxygen well on room air without  dyspnea there was low suspicion for acute bacterial infection.  PCCM recommends repeat CT chest 4-6 months.  Outpatient follow-up scheduled with pulmonology on 04/08/2022.  Nausea/vomiting: GERD CT abdomen/pelvis with contrast with decreased rectosigmoid colon wall thickening consistent with resolving colitis with no evidence of abscess or other complication, increased hepatic steatosis, right nephrolithiasis with no evidence of ureteral calculi or hydronephrosis.  Evaluated by [facility] gastroenterology.  Would likely benefit from endoscopy once off of anticoagulation.  Started on Protonix 40 mg p.o. twice daily as possible GERD component.  Outpatient follow-up has been scheduled with Dr. Havery Moros with GI on 04/08/2021.  Asthma Continue DuoNebs as needed.  Tobacco cessation.  Outpatient follow-up with PCP.  Chronic hypotension Continue midodrine 10 mg p.o. 3 times daily  Anxiety Klonopin 0.5 mg p.o. 3 times daily  Tobacco use disorder Continues to endorse 1/2 pack/day over the last 30 years.  Counseled on need for complete abstinence/cessation.  Discharge Diagnoses:  Principal Problem:   Acute pulmonary embolism ([place]) Active Problems:   Tobacco use   Asthma, chronic   Generalized abdominal pain   History of ischemic colitis    Discharge Instructions  Discharge Instructions     Call MD for:  difficulty breathing, headache or visual disturbances   Complete by: As directed    Call MD for:  extreme fatigue   Complete by: As directed    Call MD for:  persistant dizziness or light-headedness   Complete by: As directed    Call MD for:  persistant nausea and vomiting   Complete by: As directed    Call MD for:  severe uncontrolled pain   Complete by: As directed    Call MD for:  temperature >100.4   Complete by: As directed    Diet - low sodium heart healthy   Complete by: As directed    Increase  activity slowly   Complete by: As directed       Allergies as of 03/26/2022   No Known Allergies      Medication List     TAKE these medications    acetaminophen 160 MG/5ML solution Commonly known as: TYLENOL Take 20.3 mLs (650 mg total) by mouth every 6 (six) hours.   alum & mag hydroxide-simeth [id number] MG/5ML suspension Commonly known as: MAALOX PLUS Take 5 mLs by mouth 3 (three) times daily before meals.   Amitiza 24 MCG capsule Generic drug: lubiprostone Take 1 capsule (24 mcg total) by mouth 2 (two) times daily with a meal.   apixaban 5 MG Tabs tablet Commonly known as: Eliquis Take 2 tablets ('10mg'$ ) twice daily for 7 days, then 1 tablet ('5mg'$ ) twice daily   clonazePAM 0.5 MG tablet Commonly known as: KLONOPIN Take 1 tablet (0.5 mg total) by mouth 3 (three) times daily.   docusate sodium 100 MG capsule Commonly known as: COLACE Take 1 capsule (100 mg total) by mouth 2 (two) times daily.   famotidine 20 MG tablet Commonly known as: PEPCID Place 1 tablet (20 mg total) into feeding tube 2 (two) times daily.   ipratropium-albuterol 0.5-2.5 (3) MG/3ML Soln Commonly known as: DUONEB Take 3 mLs by nebulization every 4 (four) hours as needed. What changed: reasons to take this   melatonin 3 MG Tabs tablet Take 1 tablet (3 mg total) by mouth at bedtime. What changed:  when to take this reasons to take this   midodrine 10 MG tablet Commonly known as: PROAMATINE Place 1 tablet (10 mg total) into feeding tube 3 (three)  times daily.   ondansetron 4 MG tablet Commonly known as: Zofran Take 1 tablet (4 mg total) by mouth daily as needed for nausea or vomiting.   oxyCODONE-acetaminophen 10-325 MG tablet Commonly known as: PERCOCET Take 1 tablet by mouth every 6 (six) hours as needed for pain.   pantoprazole 40 MG tablet Commonly known as: PROTONIX Take 1 tablet (40 mg total) by mouth 2 (two) times daily.   polyethylene glycol powder 17 GM/SCOOP  powder Commonly known as: GLYCOLAX/MIRALAX Take 1 capful with water (17 g) by mouth daily. What changed:  when to take this reasons to take this   potassium chloride SA 20 MEQ tablet Commonly known as: KLOR-CON M Take 2 tablets (40 mEq total) by mouth 2 (two) times daily.   senna 8.6 MG Tabs tablet Commonly known as: SENOKOT Take 1 tablet (8.6 mg total) by mouth at bedtime. What changed:  when to take this reasons to take this        Follow-up Information     Armbruster, Carlota Raspberry, MD. Go on 04/09/2022.   Specialty: Gastroenterology Contact information: [address] [place] [address] [phone]         Armando Reichert, MD. Go on 04/08/2022.   Specialty: Pulmonary Disease Contact information: [address] [place] [address] [phone]                No Known Allergies  Consultations: PCCM [place] gastroenterology   Procedures/Studies: VAS [name] LOWER EXTREMITY VENOUS (DVT)  Result Date: 03/24/2022  Lower Venous DVT Study Patient Name:  Janet Hensley  Date of Exam:   03/23/2022 Medical Rec #: [id number]      Accession #:    [id number] Date of Birth: [birth year]      Patient Gender: F Patient Age:   38 years Exam Location:  [facility] Procedure:      VAS [name] LOWER EXTREMITY VENOUS (DVT) Referring Phys: Wandra Feinstein RATHORE --------------------------------------------------------------------------------  Indications: Pulmonary embolism, and recent left calf pain.  Comparison Study: No prior study Performing Technologist: Sharion Dove RVS  Examination Guidelines: A complete evaluation includes B-mode imaging, spectral Doppler, color Doppler, and power Doppler as needed of all accessible portions of each vessel. Bilateral testing is considered an integral part of a complete examination. Limited examinations for reoccurring indications may be performed as noted. The reflux portion of the exam is performed with the patient in reverse  Trendelenburg.  +---------+---------------+---------+-----------+----------+--------------+ RIGHT    CompressibilityPhasicitySpontaneityPropertiesThrombus Aging +---------+---------------+---------+-----------+----------+--------------+ CFV      Full           Yes      Yes                                 +---------+---------------+---------+-----------+----------+--------------+ SFJ      Full                                                        +---------+---------------+---------+-----------+----------+--------------+ FV Prox  Full                                                        +---------+---------------+---------+-----------+----------+--------------+  FV Mid   Full                                                        +---------+---------------+---------+-----------+----------+--------------+ FV DistalFull                                                        +---------+---------------+---------+-----------+----------+--------------+ PFV      Full                                                        +---------+---------------+---------+-----------+----------+--------------+ POP      Full           Yes      Yes                                 +---------+---------------+---------+-----------+----------+--------------+ PTV      Full                                                        +---------+---------------+---------+-----------+----------+--------------+ PERO     Full                                                        +---------+---------------+---------+-----------+----------+--------------+   +---------+---------------+---------+-----------+----------+-----------------+ LEFT     CompressibilityPhasicitySpontaneityPropertiesThrombus Aging    +---------+---------------+---------+-----------+----------+-----------------+ CFV      Full           Yes      Yes                                     +---------+---------------+---------+-----------+----------+-----------------+ SFJ      Full                                                           +---------+---------------+---------+-----------+----------+-----------------+ FV Prox  Full                                                           +---------+---------------+---------+-----------+----------+-----------------+ FV Mid   Full                                                           +---------+---------------+---------+-----------+----------+-----------------+  FV DistalFull                                                           +---------+---------------+---------+-----------+----------+-----------------+ PFV      Full                                                           +---------+---------------+---------+-----------+----------+-----------------+ POP      Full           Yes      Yes                                    +---------+---------------+---------+-----------+----------+-----------------+ PTV      None                                         Age Indeterminate +---------+---------------+---------+-----------+----------+-----------------+ PERO     None                                         Age Indeterminate +---------+---------------+---------+-----------+----------+-----------------+     Summary: RIGHT: - There is no evidence of deep vein thrombosis in the lower extremity.  LEFT: - Findings consistent with age indeterminate deep vein thrombosis involving the left posterior tibial veins, and left peroneal veins.  *See table(s) above for measurements and observations. Electronically signed by Harold Barban MD on 03/24/2022 at 3:59:16 PM.    Final    CT ABDOMEN PELVIS W CONTRAST  Result Date: 03/23/2022 CLINICAL DATA:  Vomiting. Follow-up colitis. Acute pulmonary embolism. EXAM: CT ABDOMEN AND PELVIS WITH CONTRAST TECHNIQUE: Multidetector CT imaging of the abdomen and pelvis was  performed using the standard protocol following bolus administration of intravenous contrast. RADIATION DOSE REDUCTION: This exam was performed according to the departmental dose-optimization program which includes automated exposure control, adjustment of the mA and/or kV according to patient size and/or use of iterative reconstruction technique. CONTRAST:  38m OMNIPAQUE IOHEXOL 350 MG/ML SOLN COMPARISON:  02/11/2022 FINDINGS: Lower Chest: Mild improvement in consolidation seen in the lateral left lower lobe. Hepatobiliary: No hepatic masses identified. Stable small benign-appearing cyst in anterior right hepatic lobe. Increased diffuse hepatic steatosis since prior exam. Gallbladder is unremarkable. No evidence of biliary ductal dilatation. Pancreas:  No mass or inflammatory changes. Spleen: Within normal limits in size and appearance. Adrenals/Urinary Tract: No suspicious masses identified. A few tiny less than 5 mm right renal calculi are again seen. No evidence of ureteral calculi or hydronephrosis. Stomach/Bowel: Decreased wall thickening is seen in the rectosigmoid colon since previous study, consistent with resolving colitis. No evidence of obstruction, new or worsening inflammatory process, or abnormal fluid collections. Normal appendix visualized. Vascular/Lymphatic: No pathologically enlarged lymph nodes. No acute vascular findings. Aortic atherosclerotic calcification incidentally noted. Reproductive:  No mass or other significant abnormality. Other:  None. Musculoskeletal:  No suspicious bone lesions identified. IMPRESSION: Decreased rectosigmoid colon wall  thickening, consistent with resolving colitis. No evidence of abscess or other complication. Increased hepatic steatosis. Right nephrolithiasis. No evidence of ureteral calculi or hydronephrosis. Mild improvement in peripheral left lower lobe consolidation. Aortic Atherosclerosis (ICD10-I70.0). Electronically Signed   By: Marlaine Hind M.D.   On:  03/23/2022 17:31   ECHOCARDIOGRAM COMPLETE  Result Date: 03/23/2022    ECHOCARDIOGRAM REPORT   Patient Name:   Janet Hensley Date of Exam: 03/23/2022 Medical Rec #:  [id number]     Height:       63.0 in Accession #:    [id number]    Weight:       160.0 lb Date of Birth:  10/31/1970     BSA:          1.759 m Patient Age:    38 years      BP:           126/80 mmHg Patient Gender: F             HR:           101 bpm. Exam Location:  Inpatient Procedure: 2D Echo, Cardiac Doppler and Color Doppler Indications:    pulmonary embolus  History:        Patient has prior history of Echocardiogram examinations, most                 recent 12/24/2021. CAD; Risk Factors:Current Smoker.  Sonographer:    Johny Chess RDCS Referring Phys: [id number] [place]  1. Left ventricular ejection fraction, by estimation, is 55 to 60%. The left ventricle has normal function. The left ventricle has no regional wall motion abnormalities. Left ventricular diastolic parameters are consistent with Grade I diastolic dysfunction (impaired relaxation).  2. Right ventricular systolic function is normal. The right ventricular size is normal. There is normal pulmonary artery systolic pressure.  3. The mitral valve is normal in structure. Trivial mitral valve regurgitation.  4. The aortic valve is tricuspid. Aortic valve regurgitation is not visualized. No aortic stenosis is present.  5. The inferior vena cava is normal in size with greater than 50% respiratory variability, suggesting right atrial pressure of 3 mmHg. Comparison(s): No significant change from prior study. FINDINGS  Left Ventricle: Left ventricular ejection fraction, by estimation, is 55 to 60%. The left ventricle has normal function. The left ventricle has no regional wall motion abnormalities. The left ventricular internal cavity size was normal in size. There is  no left ventricular hypertrophy. Left ventricular diastolic parameters are consistent with Grade I  diastolic dysfunction (impaired relaxation). Right Ventricle: The right ventricular size is normal. No increase in right ventricular wall thickness. Right ventricular systolic function is normal. There is normal pulmonary artery systolic pressure. The tricuspid regurgitant velocity is 2.75 m/s, and  with an assumed right atrial pressure of 3 mmHg, the estimated right ventricular systolic pressure is [id number] mmHg. Left Atrium: Left atrial size was normal in size. Right Atrium: Right atrial size was normal in size. Pericardium: There is no evidence of pericardial effusion. Mitral Valve: The mitral valve is normal in structure. Trivial mitral valve regurgitation. Tricuspid Valve: The tricuspid valve is normal in structure. Tricuspid valve regurgitation is trivial. Aortic Valve: The aortic valve is tricuspid. Aortic valve regurgitation is not visualized. No aortic stenosis is present. Pulmonic Valve: The pulmonic valve was normal in structure. Pulmonic valve regurgitation is trivial. Aorta: The aortic root and ascending aorta are structurally normal, with no evidence of dilitation. Venous: The inferior vena cava is  normal in size with greater than 50% respiratory variability, suggesting right atrial pressure of 3 mmHg. IAS/Shunts: The atrial septum is grossly normal.  LEFT VENTRICLE PLAX 2D LVIDd:         3.70 cm   Diastology LVIDs:         2.70 cm   LV e' medial:    10.70 cm/s LV PW:         1.00 cm   LV E/e' medial:  7.3 LV IVS:        0.90 cm   LV e' lateral:   12.70 cm/s LVOT diam:     2.20 cm   LV E/e' lateral: 6.1 LV SV:         70 LV SV Index:   40 LVOT Area:     3.80 cm  RIGHT VENTRICLE             IVC RV Basal diam:  2.10 cm     IVC diam: 1.10 cm RV S prime:     14.40 cm/s TAPSE (M-mode): 2.1 cm LEFT ATRIUM             Index        RIGHT ATRIUM          Index LA diam:        2.30 cm 1.31 cm/m   RA Area:     9.03 cm LA Vol (A2C):   37.0 ml 21.04 ml/m  RA Volume:   17.30 ml 9.84 ml/m LA Vol (A4C):   24.2 ml  13.76 ml/m LA Biplane Vol: 30.4 ml 17.29 ml/m  AORTIC VALVE LVOT Vmax:   98.00 cm/s LVOT Vmean:  68.700 cm/s LVOT VTI:    0.183 m  AORTA Ao Root diam: 2.80 cm Ao Asc diam:  2.90 cm MV E velocity: 78.10 cm/s   TRICUSPID VALVE MV A velocity: 113.00 cm/s  TR Peak grad:   30.2 mmHg MV E/A ratio:  0.69         TR Vmax:        275.00 cm/s                              SHUNTS                             Systemic VTI:  0.18 m                             Systemic Diam: 2.20 cm Gwyndolyn Kaufman MD Electronically signed by Gwyndolyn Kaufman MD Signature Date/Time: 03/23/2022/11:00:45 AM    Final    CT Angio Chest PE W/Cm &/Or Wo Cm  Result Date: 03/22/2022 CLINICAL DATA:  52 year old female with shortness of breath and chest pain. EXAM: CT ANGIOGRAPHY CHEST WITH CONTRAST TECHNIQUE: Multidetector CT imaging of the chest was performed using the standard protocol during bolus administration of intravenous contrast. Multiplanar CT image reconstructions and MIPs were obtained to evaluate the vascular anatomy. RADIATION DOSE REDUCTION: This exam was performed according to the departmental dose-optimization program which includes automated exposure control, adjustment of the mA and/or kV according to patient size and/or use of iterative reconstruction technique. CONTRAST:  68 cc intravenous Omnipaque 350 COMPARISON:  01/11/2022 and prior CTs. 03/22/2022 and prior radiographs FINDINGS: Cardiovascular: Segmental and larger vessel pulmonary emboli are noted within the RIGHT UPPER lobe. RIGHT  LOWER lobe segmental pulmonary emboli are also noted. There is no CT evidence of acute heart strain. There is no evidence of thoracic aortic aneurysm or pericardial effusion. Heart size is within normal limits. Mediastinum/Nodes: No enlarged mediastinal, hilar, or axillary lymph nodes. Thyroid gland, trachea, and esophagus demonstrate no significant findings. Lungs/Pleura: Scattered consolidation within the LEFT UPPER and LOWER lobes have  significantly improved, with increasing cavitary component within the LEFT UPPER lobe. Mild ground-glass opacities within the RIGHT UPPER and LOWER lobes likely represents changes from pulmonary emboli. There is no evidence of pneumothorax or pleural effusion. Upper Abdomen: No acute abnormality. Musculoskeletal: No acute or suspicious bony abnormalities are noted. Review of the MIP images confirms the above findings. IMPRESSION: 1. Acute pulmonary emboli within the RIGHT UPPER and LOWER lobes. No CT evidence of acute heart strain. 2. Scattered consolidation within the LEFT UPPER and LOWER lobes have significantly improved, with increasing cavitary component within the LEFT UPPER lobe. 3. Mild ground-glass opacities within the RIGHT UPPER and LOWER lobes likely represents changes from pulmonary emboli. Critical Value/emergent results were called by telephone at the time of interpretation on 03/22/2022 at 8:55 pm to provider Blanchie Dessert, who verbally acknowledged these results. Electronically Signed   By: Margarette Canada M.D.   On: 03/22/2022 20:57   DG Chest 2 View  Result Date: 03/22/2022 CLINICAL DATA:  Chest pain and shortness of breath. EXAM: CHEST - 2 VIEW COMPARISON:  03/12/2022 chest radiograph and prior studies FINDINGS: The visualized cardiomediastinal silhouette is unchanged. Patchy and linear opacities within the mid-lower LEFT lung, and LEFT apical opacities with probable cavitation are not significantly changed. There is no evidence pneumothorax or large pleural effusion. No acute bony abnormalities are present. IMPRESSION: Unchanged appearance of the chest with LEFT lung opacities and probable LEFT apical cavitation. This may represent continued or residual from recent pneumonia. Radiographic follow-up to resolution is recommended. Electronically Signed   By: Margarette Canada M.D.   On: 03/22/2022 19:22   DG Chest 2 View  Result Date: 03/16/2022 CLINICAL DATA:  Shortness of breath and history of  recent pneumonia EXAM: CHEST - 2 VIEW COMPARISON:  02/18/2022 FINDINGS: Cardiac shadow is stable. Persistent linear density is noted in the mid and lower left lung. The previously seen consolidation in the left apex now shows some cavitation. CT of the chest is recommended for further evaluation. Right lung remains clear. No bony abnormality is seen. IMPRESSION: Persistent consolidation with left lung although findings suggestive of underlying cavitation in the apex are noted. CT with contrast is recommended for further evaluation. Electronically Signed   By: Inez Catalina M.D.   On: 03/16/2022 01:39   DG Abd 1 View  Result Date: 02/25/2022 CLINICAL DATA:  Constipation EXAM: ABDOMEN - 1 VIEW COMPARISON:  02/17/2022 FINDINGS: Scattered large and small bowel gas is noted. No significant retained fecal material is noted. No obstructive changes are seen. No free air is noted. No bony abnormality is seen. IMPRESSION: No evidence of constipation.  No acute abnormality noted. Electronically Signed   By: Inez Catalina M.D.   On: 02/25/2022 21:27     Subjective: Patient seen examined bedside, resting calmly.  Lying in bed.  Eating breakfast.  No further nausea/vomiting.  Discharging home with outpatient follow-up scheduled with pulmonology and GI.  Discussed to continue Protonix twice daily until follows up with GI as well as initiation on Eliquis.  No other specific questions or concerns at this time.  Denies headache, no dizziness, no chest  pain, palpitations, no shortness of breath, no abdominal pain, no fever/chills/night sweats, no nausea/vomiting/diarrhea, no focal weakness, no cough/congestion, no paresthesias.  No acute events overnight per nursing staff.  Discharge Exam: Vitals:   03/26/22 0559 03/26/22 0751  BP: (!) 101/55 (!) 103/57  Pulse: 81 82  Resp:  16  Temp: 97.6 F (36.4 C) 98 F (36.7 C)  SpO2: 96% 96%   Vitals:   03/25/22 1547 03/25/22 1950 03/26/22 0559 03/26/22 0751  BP: (!) 100/58  (!) 99/51 (!) 101/55 (!) 103/57  Pulse: 89 88 81 82  Resp: '16 16  16  '$ Temp: 98.3 F (36.8 C) 98 F (36.7 C) 97.6 F (36.4 C) 98 F (36.7 C)  TempSrc: Oral Oral  Oral  SpO2: 98% 99% 96% 96%  Weight:      Height:        Physical Exam: GEN: NAD, alert and oriented x 3, chronically ill in appearance, appears older than stated age HEENT: NCAT, PERRL, EOMI, sclera clear, MMM PULM: CTAB w/o wheezes/crackles, normal respiratory effort, on room air CV: RRR w/o M/G/R GI: abd soft, NTND, NABS, no R/G/M MSK: no peripheral edema, muscle strength globally intact 5/5 bilateral upper/lower extremities NEURO: CN II-XII intact, no focal deficits, sensation to light touch intact PSYCH: normal mood/affect Integumentary: dry/intact, no rashes or wounds    The results of significant diagnostics from this hospitalization (including imaging, microbiology, ancillary and laboratory) are listed below for reference.     Microbiology: No results found for this or any previous visit (from the past 240 hour(s)).   Labs: BNP (last 3 results) Recent Labs    12/22/21 1836 12/27/21 0424 03/22/22 1837  BNP 15.2 110.4* [id number]   Basic Metabolic Panel: Recent Labs  Lab 03/22/22 1837 03/24/22 0214  NA 138 137  K 3.7 4.1  CL 105 109  CO2 23 24  GLUCOSE 159* 79  BUN 8 7  CREATININE 0.91 0.74  CALCIUM 8.3* 8.1*   Liver Function Tests: Recent Labs  Lab 03/22/22 1837  AST 30  ALT 23  ALKPHOS 189*  BILITOT 0.4  PROT 6.4*  ALBUMIN 1.8*   Recent Labs  Lab 03/23/22 0900  LIPASE 30   No results for input(s): "AMMONIA" in the last 168 hours. CBC: Recent Labs  Lab 03/22/22 1837 03/23/22 0018 03/24/22 0214 03/25/22 0258  WBC 11.0* 9.3 7.2 7.9  NEUTROABS 6.7  --   --   --   HGB 11.3* 10.3* 9.9* 9.6*  HCT 36.9 32.6* 31.8* 30.2*  MCV 98.9 96.7 97.2 95.6  PLT 431* 365 327 346   Cardiac Enzymes: No results for input(s): "CKTOTAL", "CKMB", "CKMBINDEX", "TROPONINI" in the last 168  hours. BNP: Invalid input(s): "POCBNP" CBG: No results for input(s): "GLUCAP" in the last 168 hours. D-Dimer No results for input(s): "DDIMER" in the last 72 hours. Hgb A1c No results for input(s): "HGBA1C" in the last 72 hours. Lipid Profile No results for input(s): "CHOL", "HDL", "LDLCALC", "TRIG", "CHOLHDL", "LDLDIRECT" in the last 72 hours. Thyroid function studies No results for input(s): "TSH", "T4TOTAL", "T3FREE", "THYROIDAB" in the last 72 hours.  Invalid input(s): "FREET3" Anemia work up No results for input(s): "VITAMINB12", "FOLATE", "FERRITIN", "TIBC", "IRON", "RETICCTPCT" in the last 72 hours. Urinalysis    Component Value Date/Time   COLORURINE YELLOW 02/24/2022 1455   APPEARANCEUR CLEAR 02/24/2022 1455   APPEARANCEUR Clear 02/16/2013 1755   LABSPEC 1.004 (L) 02/24/2022 1455   LABSPEC 1.023 02/16/2013 1755   PHURINE 5.0 02/24/2022 1455  GLUCOSEU NEGATIVE 02/24/2022 1455   GLUCOSEU Negative 02/16/2013 1755   HGBUR NEGATIVE 02/24/2022 1455   BILIRUBINUR NEGATIVE 02/24/2022 1455   BILIRUBINUR Negative 02/16/2013 1755   KETONESUR NEGATIVE 02/24/2022 1455   PROTEINUR NEGATIVE 02/24/2022 1455   NITRITE NEGATIVE 02/24/2022 1455   LEUKOCYTESUR NEGATIVE 02/24/2022 1455   LEUKOCYTESUR Negative 02/16/2013 1755   Sepsis Labs Recent Labs  Lab 03/22/22 1837 03/23/22 0018 03/24/22 0214 03/25/22 0258  WBC 11.0* 9.3 7.2 7.9   Microbiology No results found for this or any previous visit (from the past 240 hour(s)).   Time coordinating discharge: Over 30 minutes  SIGNED:   Donnamarie Poag [place] DO  [facility] 03/26/2022, 9:44 AM

## 2022-03-26 NOTE — Progress Notes (Signed)
   03/26/22 1000  Mobility  Activity Ambulated with assistance in hallway  Level of Assistance Modified independent, requires aide device or extra time  Assistive Device  (HHA)  Distance Ambulated (ft) 250 ft  Activity Response Tolerated well  Mobility Referral Yes  $Mobility charge 1 Mobility   Mobility Specialist Progress Note  Pt was in bed and agreeable. Had no c/o pain. Returned to bed w/ all needs met and call bell in reach   [place] Specialist  Please contact via Solicitor or Rehab office at [phone]

## 2022-03-27 DIAGNOSIS — G7281 Critical illness myopathy: Secondary | ICD-10-CM | POA: Diagnosis not present

## 2022-03-31 ENCOUNTER — Telehealth: Payer: Self-pay | Admitting: Pulmonary Disease

## 2022-03-31 NOTE — Telephone Encounter (Signed)
error 

## 2022-03-31 NOTE — Telephone Encounter (Signed)
Patient returned call to office. She stated that she was told an antibiotic would be called in for her after she was discharged. Per patient, her pharmacy has not received any antibiotic prescriptions for her. I looked at her hospital notes and did not see any mentioning of antibiotics at discharge.   Whitney, can you please advise if possible? Thanks!

## 2022-04-01 ENCOUNTER — Telehealth: Payer: Self-pay

## 2022-04-01 NOTE — Telephone Encounter (Signed)
Refill request for Klonopin

## 2022-04-03 NOTE — Telephone Encounter (Signed)
LM for patient to refer to her PCP for refill.

## 2022-04-08 ENCOUNTER — Institutional Professional Consult (permissible substitution): Payer: Disability Insurance | Admitting: Student in an Organized Health Care Education/Training Program

## 2022-04-09 ENCOUNTER — Ambulatory Visit (INDEPENDENT_AMBULATORY_CARE_PROVIDER_SITE_OTHER): Payer: 59 | Admitting: Gastroenterology

## 2022-04-09 ENCOUNTER — Other Ambulatory Visit (INDEPENDENT_AMBULATORY_CARE_PROVIDER_SITE_OTHER): Payer: 59

## 2022-04-09 ENCOUNTER — Encounter: Payer: Self-pay | Admitting: Gastroenterology

## 2022-04-09 VITALS — BP 120/80 | HR 80 | Ht 63.0 in | Wt 154.0 lb

## 2022-04-09 DIAGNOSIS — Z8719 Personal history of other diseases of the digestive system: Secondary | ICD-10-CM

## 2022-04-09 DIAGNOSIS — R109 Unspecified abdominal pain: Secondary | ICD-10-CM

## 2022-04-09 DIAGNOSIS — K5900 Constipation, unspecified: Secondary | ICD-10-CM | POA: Diagnosis not present

## 2022-04-09 DIAGNOSIS — R112 Nausea with vomiting, unspecified: Secondary | ICD-10-CM

## 2022-04-09 DIAGNOSIS — I2699 Other pulmonary embolism without acute cor pulmonale: Secondary | ICD-10-CM

## 2022-04-09 DIAGNOSIS — Z7901 Long term (current) use of anticoagulants: Secondary | ICD-10-CM

## 2022-04-09 DIAGNOSIS — F1190 Opioid use, unspecified, uncomplicated: Secondary | ICD-10-CM

## 2022-04-09 LAB — CBC WITH DIFFERENTIAL/PLATELET
Basophils Absolute: 0.2 10*3/uL — ABNORMAL HIGH (ref 0.0–0.1)
Basophils Relative: 1.4 % (ref 0.0–3.0)
Eosinophils Absolute: 0.1 10*3/uL (ref 0.0–0.7)
Eosinophils Relative: 0.6 % (ref 0.0–5.0)
HCT: 40 % (ref 36.0–46.0)
Hemoglobin: 12.9 g/dL (ref 12.0–15.0)
Lymphocytes Relative: 16.5 % (ref 12.0–46.0)
Lymphs Abs: 2.3 10*3/uL (ref 0.7–4.0)
MCHC: 32.3 g/dL (ref 30.0–36.0)
MCV: 90.6 fl (ref 78.0–100.0)
Monocytes Absolute: 0.7 10*3/uL (ref 0.1–1.0)
Monocytes Relative: 4.8 % (ref 3.0–12.0)
Neutro Abs: 10.6 10*3/uL — ABNORMAL HIGH (ref 1.4–7.7)
Neutrophils Relative %: 76.7 % (ref 43.0–77.0)
Platelets: 364 10*3/uL (ref 150.0–400.0)
RBC: 4.41 Mil/uL (ref 3.87–5.11)
RDW: 15.2 % (ref 11.5–15.5)
WBC: 13.8 10*3/uL — ABNORMAL HIGH (ref 4.0–10.5)

## 2022-04-09 LAB — COMPREHENSIVE METABOLIC PANEL
ALT: 30 U/L (ref 0–35)
AST: 43 U/L — ABNORMAL HIGH (ref 0–37)
Albumin: 2.2 g/dL — ABNORMAL LOW (ref 3.5–5.2)
Alkaline Phosphatase: 138 U/L — ABNORMAL HIGH (ref 39–117)
BUN: 9 mg/dL (ref 6–23)
CO2: 25 mEq/L (ref 19–32)
Calcium: 8.2 mg/dL — ABNORMAL LOW (ref 8.4–10.5)
Chloride: 106 mEq/L (ref 96–112)
Creatinine, Ser: 0.67 mg/dL (ref 0.40–1.20)
GFR: 100.86 mL/min (ref >60.00)
Glucose, Bld: 92 mg/dL (ref 70–99)
Potassium: 3.5 mEq/L (ref 3.5–5.1)
Sodium: 138 mEq/L (ref 135–145)
Total Bilirubin: 0.3 mg/dL (ref 0.2–1.2)
Total Protein: 6.2 g/dL (ref 6.0–8.3)

## 2022-04-09 MED ORDER — METOCLOPRAMIDE HCL 5 MG PO TABS: 5.0000 mg | ORAL_TABLET | Freq: Three times a day (TID) | ORAL | 1 refills | Status: AC

## 2022-04-09 NOTE — Progress Notes (Signed)
HPI :  52 year old female known to me from prior hospitalization, here for a follow-up visit. We got involved in her care as she was on the rehab facility recovering from prolonged hospitalization.  Her course is as outlined below.  History of smoking, COPD, asthma.  Narcotics/polysubstance abuse (snorting crushed oxycodone PTA).  Chronic back/neck pain.  Opioid induced constipation   Admission [facility] 11/26 - 12/28/2021 with respiratory failure, severe CAP left lung consolidation, lung aspirate positive for Pseudomonas, stenotrophomonas, COPD flare, severe sepsis/ARDS, Rothia Mucilaginosa bacteremia, urine positive for Legionella, AKI, thrombocytopenia, severe PCM.  Transferred to [facility] hospital for ECMO.   Admission [facility] 12/2 -01/08/2022.  Treated with ECMO.  Difficulty weaning off vent, tracheostomy 12/8 with eventual wean to trach collar.  Occasional hypotension requiring low-dose Levophed and then midodrine.   Transferred to [facility] 12/13 - 02/06/22.  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  Trach decannulated, weaned off supplemental oxygen.  Diet advanced to dysphagia 2, thin liquids.  AKA resolved.   Admission to CIR on 1/11 for debilitation, critical illness myopathy.   While recovering on the ward she developed non-bloody n/v, diffuse abd pain started PM 1/12.  She had worsening constipation, was given enemas, had rectal bleeding and significant abdominal pain.  CT scan performed showing inflammatory changes in the rectosigmoid colon consistent with colitis.  Of note, question of pancreatitis on CT scan but not definitive.   Flex sig showed some mild inflammatory changes in the left colon yesterday, not classic for ischemic colitis.   FINAL MICROSCOPIC DIAGNOSIS:   A. COLON, SIGMOID, BIOPSY:  - COLONIC MUCOSA WITH FOCAL ISCHEMIC CHANGES  - SEE COMMENT  COMMENT:  Sections of the biopsy show fragments of colonic mucosa with focal   hyalinized, fibrotic lamina propria and slightly withered crypts.  No  significant acute inflammation is noted.  Clinical correlation  recommended.   Thought to be due to constipation Recommended Miralax daily  She eventually recovered and was discharged.  Unfortunately was readmitted on February 24. Symptomatic pulmonary embolus CT PE protocol 2/24 occurring after several weeks of limited activity. CT angiogram of the chest PE protocol confirmed the acute pulmonary emboli. Limited images of abdomen included in that study.   She was treated with anticoagulation.  Our service was reconsulted given she had nausea vomiting, abdominal pain.  Repeat CT scan done did not show any clear cause for that, as outlined below.  CT scan abdomen / pelvis with contrast 03/23/22: IMPRESSION: Decreased rectosigmoid colon wall thickening, consistent with resolving colitis. No evidence of abscess or other complication. Increased hepatic steatosis. Right nephrolithiasis. No evidence of ureteral calculi or hydronephrosis. Mild improvement in peripheral left lower lobe consolidation. Aortic Atherosclerosis (ICD10-I70.0).   She has been home for the past few weeks finally, recovering.  She states she has been persistently nauseated most of the time, she vomits a few times per day which appears sporadic.  She does have early satiety and is not feeling well.  She does have occasional reflux that bothers her she is lost about 36 pounds since she was first admitted last winter, she think she is lost another 6 pounds since being home from the hospital because she is just not eating well.  She does have upper abdominal pain that can localize to the epigastric, right upper quadrant, and right lower quadrant area.  She is taking Colace and Amitiza and moving her bowels regularly.  She denies any blood in her stools.  She is  taking Pepcid, and is also taking Protonix 40 mg twice daily.  She is on chronic narcotics, Percocet  for chronic pain syndrome, prescribed 4 times daily.  She has been taking Zofran as needed for nausea, she took some of her brothers Phenergan as well, she states potentially it may help.  She has not tried any Reglan.  She has never had an EGD.  Echo 03/23/22: EF 55-60%, grade I DD  Past Medical History:  Diagnosis Date   AKI (acute kidney injury) ([place])    Aortic atherosclerosis ([place])    Asthma 01/15/2012   Chronic anemia    COPD (chronic obstructive pulmonary disease) ([facility])    Hepatic steatosis    Narcotic abuse ([place])    Nephrolithiasis    Pulmonary embolism ([place]) 2024   Thrombocytopenia ([place])      Past Surgical History:  Procedure Laterality Date   BACK SURGERY     BIOPSY  02/13/2022   Procedure: BIOPSY;  Surgeon: Yetta Flock, MD;  Location: [place];  Service: Gastroenterology;;   CESAREAN SECTION     FLEXIBLE SIGMOIDOSCOPY N/A 02/13/2022   Procedure: Beryle Quant;  Surgeon: Yetta Flock, MD;  Location: [place];  Service: Gastroenterology;  Laterality: N/A;   LEFT HEART CATH AND CORONARY ANGIOGRAPHY N/A 12/24/2021   Procedure: LEFT HEART CATH AND CORONARY ANGIOGRAPHY;  Surgeon: Nelva Bush, MD;  Location: [place] [facility];  Service: Cardiovascular;  Laterality: N/A;   wrist surgery     No family history on file. Social History   Tobacco Use   Smoking status: Every Day    Types: Cigarettes   Smokeless tobacco: Never  Vaping Use   Vaping Use: Former   Current Outpatient Medications  Medication Sig Dispense Refill   acetaminophen (TYLENOL) 160 MG/5ML solution Take 20.3 mLs (650 mg total) by mouth every 6 (six) hours. 120 mL 0   alum & mag hydroxide-simeth (MAALOX PLUS) 400-400-40 MG/5ML suspension Take 5 mLs by mouth 3 (three) times daily before meals. 355 mL 0   Apixaban Starter Pack, '10mg'$  and '5mg'$ , (ELIQUIS DVT/PE STARTER PACK) Take 2 tablets ('10mg'$ ) by mouth twice daily for 7 days, then 1 tablet ('5mg'$ ) twice daily 74 tablet 0    clonazePAM (KLONOPIN) 0.5 MG tablet Take 1 tablet (0.5 mg total) by mouth 3 (three) times daily. 45 tablet 0   famotidine (PEPCID) 20 MG tablet Take 1 tablet (20 mg total) by mouth 2 (two) times daily. 60 tablet 0   ipratropium-albuterol (DUONEB) 0.5-2.5 (3) MG/3ML SOLN Take 3 mLs by nebulization every 4 (four) hours as needed. (Patient taking differently: Take 3 mLs by nebulization every 4 (four) hours as needed (sob/wheezing).) 360 mL    lubiprostone (AMITIZA) 24 MCG capsule Take 1 capsule (24 mcg total) by mouth 2 (two) times daily with a meal. 60 capsule 0   melatonin 3 MG TABS tablet Take 1 tablet (3 mg total) by mouth at bedtime. (Patient taking differently: Take 3 mg by mouth at bedtime as needed (sleep).) 30 tablet 0   metoCLOPramide (REGLAN) 5 MG tablet Take 1 tablet (5 mg total) by mouth 3 (three) times daily. 90 tablet 1   midodrine (PROAMATINE) 10 MG tablet Take 1 tablet (10 mg total) by mouth 3 (three) times daily. 90 tablet 0   oxyCODONE-acetaminophen (PERCOCET) 10-325 MG tablet Take 1 tablet by mouth every 6 (six) hours as needed for pain.     pantoprazole (PROTONIX) 40 MG tablet Take 1 tablet (40 mg total) by mouth  2 (two) times daily. 60 tablet 2   polyethylene glycol powder (GLYCOLAX/MIRALAX) 17 GM/SCOOP powder Take 1 capful with water (17 g) by mouth daily. (Patient taking differently: Take 17 g by mouth daily as needed for moderate constipation.) 238 g 0   potassium chloride SA (KLOR-CON M) 20 MEQ tablet Take 2 tablets (40 mEq total) by mouth 2 (two) times daily. 120 tablet 0   ondansetron (ZOFRAN) 4 MG tablet Take 1 tablet (4 mg total) by mouth daily as needed for nausea or vomiting. (Patient not taking: Reported on 04/09/2022) 30 tablet 1   No current facility-administered medications for this visit.   No Known Allergies   Review of Systems: All systems reviewed and negative except where noted in HPI.    Lab Results  Component Value Date   WBC 7.9 03/25/2022   HGB 9.6  (L) 03/25/2022   HCT 30.2 (L) 03/25/2022   MCV 95.6 03/25/2022   PLT 346 03/25/2022    Lab Results  Component Value Date   CREATININE 0.74 03/24/2022   BUN 7 03/24/2022   NA 137 03/24/2022   K 4.1 03/24/2022   CL 109 03/24/2022   CO2 24 03/24/2022    Lab Results  Component Value Date   ALT 23 03/22/2022   AST 30 03/22/2022   ALKPHOS 189 (H) 03/22/2022   BILITOT 0.4 03/22/2022     Physical Exam: BP 120/80   Pulse 80   Ht '5\' 3"'$  (1.6 m)   Wt 154 lb (69.9 kg)   LMP 01/16/2016   BMI 27.28 kg/m  Constitutional: Pleasant,well-developed, female in no acute distress. Abdominal: Soft, nondistended, nontender. There are no masses palpable. Extremities: no edema Neurological: Alert and oriented to person place and time. Skin: Skin is warm and dry. No rashes noted. Psychiatric: Normal mood and affect. Behavior is normal.   ASSESSMENT: 52 y.o. female here for assessment of the following  1. Nausea and vomiting, unspecified vomiting type   2. Abdominal pain, unspecified abdominal location   3. Constipation, unspecified constipation type   4. History of ischemic colitis   5. Chronic narcotic use   6. Acute pulmonary embolism, unspecified pulmonary embolism type, unspecified whether acute cor pulmonale present ([place])   7. Anticoagulated    Prolonged hospital course and rehabilitation as outlined above.  Unfortunately developed ischemic colitis in the setting of severe constipation in February.  Eventually recovered from that and more recently developed an acute PE, now on Eliquis.  During this time in the hospital over the past 1 to 2 months she has had persistent nausea and vomiting which she thinks is getting worse over time.  Associated with early satiety, decreased appetite.  PPI has not really provided much benefit.  Antiemetics helped slightly.  She does take chronic narcotics for pain, I counseled her this could be contributing to her nausea and vomiting and recommend she  really minimize narcotics if she can.  I do think she needs an upper endoscopy to clear her upper tract, the question is if she can have this done safely given recent PE in recent weeks.  I will touch base with her anesthesia staff, she may need to have this done in the hospital if we need to do it soon, however she is definitely improved from cardiopulmonary perspective and her echo looked okay.  We can do the exam on Eliquis.  We do need to clear her upper tract and we will let her know when we can do that.  In  the interim we will start her empirically on Reglan 5 mg 3 times daily to treat potential underlying gastroparesis, while we continue PPI.  I outlined risks of Reglan with her, very rare at low-dose.  She will continue bowel regimen to prevent constipation.  Otherwise we will also get right upper quadrant ultrasound to screen for gallstones, as biliary colic remains possible.  Will recheck LFTs and CBC as well.  PLAN: - start Reglan '5mg'$  TID - 1 month supply with a refill - minimize narcotic use - scheduled Zofran BID to TID - lab today for CBC and CMET - schedule RUQ [name] - rule out gallstones - EGD needed - will discuss with anesthesia when we can do this in the [place] or hospital, recent echo looks okay - continue Amitiza and colace for constipation - full colonoscopy once recovered from her acute illness and can tolerate a prep  Jolly Mango, MD [facility]

## 2022-04-09 NOTE — Patient Instructions (Addendum)
Please go to the lab in the basement of our building to have lab work done as you leave today. Hit "B" for basement when you get on the elevator.  When the doors open the lab is on your left.  We will call you with the results. Thank you.   You have been scheduled for an abdominal ultrasound at [place] (1st floor of hospital) on Wed, 04-16-22 at 10:30am. Please arrive 15 minutes prior to your appointment for registration. Make certain not to have anything to eat or drink 6 hours prior to your appointment. Should you need to reschedule your appointment, please contact radiology at [phone]. This test typically takes about 30 minutes to perform.  We have sent the following medications to your pharmacy for you to pick up at your convenience: Reglan 5 mg: Take three times a day  Continue Zofran twice or three times a day.  Reduce narcotics use.  Thank you for entrusting me with your care and for choosing [facility], Dr. Graeagle Cellar   If your blood pressure at your visit was 140/90 or greater, please contact your primary care physician to follow up on this.  _______________________________________________________  If you are age 75 or older, your body mass index should be between 23-30. Your Body mass index is 27.28 kg/m. If this is out of the aforementioned range listed, please consider follow up with your Primary Care Provider.  If you are age 72 or younger, your body mass index should be between 19-25. Your Body mass index is 27.28 kg/m. If this is out of the aformentioned range listed, please consider follow up with your Primary Care Provider.   ________________________________________________________  The [place] [facility] providers would like to encourage you to use [facility] to communicate with providers for non-urgent requests or questions.  Due to long hold times on the telephone, sending your provider a message by [facility] may be a faster and more efficient way  to get a response.  Please allow 48 business hours for a response.  Please remember that this is for non-urgent requests.  _______________________________________________________  Due to recent changes in healthcare laws, you may see the results of your imaging and laboratory studies on MyChart before your provider has had a chance to review them.  We understand that in some cases there may be results that are confusing or concerning to you. Not all laboratory results come back in the same time frame and the provider may be waiting for multiple results in order to interpret others.  Please give [name] 48 hours in order for your provider to thoroughly review all the results before contacting the office for clarification of your results.

## 2022-04-10 ENCOUNTER — Other Ambulatory Visit: Payer: Self-pay

## 2022-04-11 ENCOUNTER — Other Ambulatory Visit: Payer: Self-pay

## 2022-04-11 DIAGNOSIS — F1190 Opioid use, unspecified, uncomplicated: Secondary | ICD-10-CM

## 2022-04-11 DIAGNOSIS — Z8719 Personal history of other diseases of the digestive system: Secondary | ICD-10-CM

## 2022-04-11 DIAGNOSIS — R109 Unspecified abdominal pain: Secondary | ICD-10-CM

## 2022-04-11 DIAGNOSIS — I2699 Other pulmonary embolism without acute cor pulmonale: Secondary | ICD-10-CM

## 2022-04-11 DIAGNOSIS — R112 Nausea with vomiting, unspecified: Secondary | ICD-10-CM

## 2022-04-11 DIAGNOSIS — K5900 Constipation, unspecified: Secondary | ICD-10-CM

## 2022-04-11 DIAGNOSIS — Z7901 Long term (current) use of anticoagulants: Secondary | ICD-10-CM

## 2022-04-14 ENCOUNTER — Telehealth: Payer: Self-pay

## 2022-04-14 NOTE — Telephone Encounter (Signed)
Called and LM for patient that she has been given the OK to have EGD with Dr. Havery Moros.  Asked her to call back to be scheduled. She will stay ON Eliquis for the procedure.

## 2022-04-14 NOTE — Telephone Encounter (Signed)
-----   Message from Yetta Flock, MD sent at 04/11/2022  6:13 PM EDT ----- Regarding: RE: possible EGD in the [place] thank you for the response I appreciate it.  Jan can you please reach out and help schedule this patient for EGD at the [facility], first available opening.  We can do this on Eliquis, she does not need to hold it for the EGD.  Thanks.  Dr. Havery Moros ----- Message ----- From: Osvaldo Angst, CRNA Sent: 04/11/2022  12:34 PM EDT To: Yetta Flock, MD Subject: RE: possible EGD in the [place]                    Dr. Havery Moros,  This pt is cleared for anesthetic care at [facility].  Thanks,  Osvaldo Angst ----- Message ----- From: Yetta Flock, MD Sent: 04/09/2022   5:15 PM EDT To: Osvaldo Angst, CRNA; Roetta Sessions, CMA Subject: possible EGD in the [place], this patient needs an EGD.  She had a pulmonary embolism on February 24, on anticoagulation now, she looks stable, her echo looked okay.  I was curious when you think she may be okay to do in the [facility], is there a particular timeframe after PE like this or does it depend on her clinical course? We would do her case on anticoagulation. Thanks for your input.  Richardson Landry

## 2022-04-15 ENCOUNTER — Other Ambulatory Visit: Payer: Self-pay

## 2022-04-16 ENCOUNTER — Ambulatory Visit (HOSPITAL_COMMUNITY): Admission: RE | Admit: 2022-04-16 | Payer: 59 | Source: Ambulatory Visit

## 2022-04-16 NOTE — Telephone Encounter (Signed)
Called and spoke to patient. She has been scheduled for EGD on 4-30 (on Eliquis) and a PV on 4-16 via telephone.

## 2022-04-30 ENCOUNTER — Encounter (HOSPITAL_COMMUNITY): Payer: Self-pay | Admitting: Emergency Medicine

## 2022-04-30 ENCOUNTER — Emergency Department (HOSPITAL_COMMUNITY): Payer: 59

## 2022-04-30 ENCOUNTER — Emergency Department (HOSPITAL_COMMUNITY)
Admission: EM | Admit: 2022-04-30 | Discharge: 2022-05-01 | Disposition: A | Discharge status: Home / Self Care | Payer: 59 | Attending: Emergency Medicine | Admitting: Emergency Medicine

## 2022-04-30 DIAGNOSIS — R112 Nausea with vomiting, unspecified: Secondary | ICD-10-CM | POA: Diagnosis not present

## 2022-04-30 DIAGNOSIS — M79605 Pain in left leg: Secondary | ICD-10-CM | POA: Insufficient documentation

## 2022-04-30 DIAGNOSIS — R000 Tachycardia, unspecified: Secondary | ICD-10-CM | POA: Diagnosis not present

## 2022-04-30 DIAGNOSIS — M79604 Pain in right leg: Secondary | ICD-10-CM | POA: Insufficient documentation

## 2022-04-30 DIAGNOSIS — Z7901 Long term (current) use of anticoagulants: Secondary | ICD-10-CM | POA: Diagnosis not present

## 2022-04-30 DIAGNOSIS — R079 Chest pain, unspecified: Secondary | ICD-10-CM | POA: Insufficient documentation

## 2022-04-30 LAB — CBC WITH DIFFERENTIAL/PLATELET
Abs Immature Granulocytes: 0.02 10*3/uL (ref 0.00–0.07)
Basophils Absolute: 0 10*3/uL (ref 0.0–0.1)
Basophils Relative: 0 %
Eosinophils Absolute: 0 10*3/uL (ref 0.0–0.5)
Eosinophils Relative: 0 %
HCT: 34.7 % — ABNORMAL LOW (ref 36.0–46.0)
Hemoglobin: 11.1 g/dL — ABNORMAL LOW (ref 12.0–15.0)
Immature Granulocytes: 0 %
Lymphocytes Relative: 31 %
Lymphs Abs: 3 10*3/uL (ref 0.7–4.0)
MCH: 29.7 pg (ref 26.0–34.0)
MCHC: 32 g/dL (ref 30.0–36.0)
MCV: 92.8 fL (ref 80.0–100.0)
Monocytes Absolute: 0.7 10*3/uL (ref 0.1–1.0)
Monocytes Relative: 7 %
Neutro Abs: 5.9 10*3/uL (ref 1.7–7.7)
Neutrophils Relative %: 62 %
Platelets: 526 10*3/uL — ABNORMAL HIGH (ref 150–400)
RBC: 3.74 MIL/uL — ABNORMAL LOW (ref 3.87–5.11)
RDW: 15.9 % — ABNORMAL HIGH (ref 11.5–15.5)
WBC: 9.7 10*3/uL (ref 4.0–10.5)
nRBC: 0 % (ref 0.0–0.2)

## 2022-04-30 LAB — COMPREHENSIVE METABOLIC PANEL
ALT: 23 U/L (ref 0–44)
AST: 33 U/L (ref 15–41)
Albumin: 1.6 g/dL — ABNORMAL LOW (ref 3.5–5.0)
Alkaline Phosphatase: 141 U/L — ABNORMAL HIGH (ref 38–126)
Anion gap: 12 (ref 5–15)
BUN: 8 mg/dL (ref 6–20)
CO2: 23 mmol/L (ref 22–32)
Calcium: 8.4 mg/dL — ABNORMAL LOW (ref 8.9–10.3)
Chloride: 105 mmol/L (ref 98–111)
Creatinine, Ser: 0.77 mg/dL (ref 0.44–1.00)
GFR, Estimated: >60 mL/min (ref >60)
Glucose, Bld: 71 mg/dL (ref 70–99)
Potassium: 4 mmol/L (ref 3.5–5.1)
Sodium: 140 mmol/L (ref 135–145)
Total Bilirubin: 0.6 mg/dL (ref 0.3–1.2)
Total Protein: 5.7 g/dL — ABNORMAL LOW (ref 6.5–8.1)

## 2022-04-30 LAB — TROPONIN I (HIGH SENSITIVITY)
Troponin I (High Sensitivity): 4 ng/L (ref <18)
Troponin I (High Sensitivity): 4 ng/L (ref <18)

## 2022-04-30 MED ORDER — APIXABAN 5 MG PO TABS
5.0000 mg | ORAL_TABLET | Freq: Two times a day (BID) | ORAL | Status: DC
  Administered 2022-05-01: 5 mg via ORAL
  Filled 2022-04-30: qty 1

## 2022-04-30 NOTE — ED Triage Notes (Signed)
PT having chest pain going down left arm. PT reporting bilateral leg swelling. Is on thinners for DVTs. Also endoreses having problems keeping down food. Lost 40 pounds since December. States had complication with colonoscopy.

## 2022-04-30 NOTE — ED Provider Notes (Incomplete)
[facility] Provider Note   CSN: [id number] Arrival date & time: 04/30/22  1823     History {Add pertinent medical, surgical, social history, OB history to HPI:1} Chief Complaint  Patient presents with  . Chest Pain    Janet Hensley is a 52 y.o. female.  Patient presents to the emergency department today for evaluation of chest pain.  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  She states that she has been compliant with her medication and has been taking it regularly.  Patient reports coming to get checked for "several problems".  She does report difficulty with eating and drinking, frequent vomiting ongoing over the past several months associated with about 40 pounds weight loss.  She is due to have an ultrasound and upper endoscopy later in the month.  She states that she feels generally weak and dehydrated.  She also reports shin pain bilaterally and bilateral lower extremity swelling which is slightly worse than baseline.  Today she had generalized chest pain, mainly in the morning, that is now resolved.  No associated diaphoresis.  Pain was nonexertional.  It did radiate into her left arm.       Home Medications Prior to Admission medications   Medication Sig Start Date End Date Taking? Authorizing Provider  acetaminophen (TYLENOL) 160 MG/5ML solution Take 20.3 mLs (650 mg total) by mouth every 6 (six) hours. 01/08/22   Mick Sell, PA-C  alum & mag hydroxide-simeth (MAALOX PLUS) 400-400-40 MG/5ML suspension Take 5 mLs by mouth 3 (three) times daily before meals. 03/12/22   Gertie Gowda, DO  Apixaban  Starter Pack, 10mg  and 5mg , (ELIQUIS DVT/PE STARTER PACK) Take 2 tablets (10mg ) by mouth twice daily for 7 days, then 1 tablet (5mg ) twice daily 03/26/22   [place] Donnamarie Poag, DO  clonazePAM (KLONOPIN) 0.5 MG tablet Take 1 tablet (0.5 mg total) by mouth 3 (three) times daily. 02/27/22   Love, Ivan Anchors, PA-C  famotidine (PEPCID) 20 MG tablet Take 1 tablet (20 mg total) by mouth 2 (two) times daily. 03/26/22   [place] Eric J, DO  ipratropium-albuterol (DUONEB) 0.5-2.5 (3) MG/3ML SOLN Take 3 mLs by nebulization every 4 (four) hours as needed. Patient taking differently: Take 3 mLs by nebulization every 4 (four) hours as needed (sob/wheezing). 01/08/22   Mick Sell, PA-C  lubiprostone (AMITIZA) 24 MCG capsule Take 1 capsule (24 mcg total) by mouth 2 (two) times daily with a meal. 02/27/22   Love, Ivan Anchors, PA-C  melatonin 3 MG TABS tablet Take 1 tablet (3 mg total) by mouth at bedtime. Patient taking differently: Take 3 mg by mouth at bedtime as needed (sleep). 02/27/22   Love, Ivan Anchors, PA-C  metoCLOPramide (REGLAN) 5 MG tablet Take 1 tablet (5 mg total) by mouth 3 (three) times daily. 04/09/22   Armbruster, Carlota Raspberry, MD  midodrine (PROAMATINE) 10 MG tablet Take 1 tablet (10 mg total) by mouth 3 (three) times daily. 03/26/22   [place] Eric J, DO  ondansetron (ZOFRAN) 4 MG tablet Take 1 tablet (4 mg total) by mouth daily as needed for nausea or vomiting. Patient not taking: Reported on 04/09/2022 03/26/22 03/26/23  [place] Donnamarie Poag, DO  oxyCODONE-acetaminophen (PERCOCET) 10-325 MG tablet Take 1 tablet by mouth every 6 (six) hours as needed for pain.    [provider]  pantoprazole (PROTONIX) 40 MG tablet Take 1 tablet (40 mg total) by mouth 2 (two) times daily. 03/26/22 06/24/22  [place] Donnamarie Poag, DO  polyethylene glycol powder (GLYCOLAX/MIRALAX) 17 GM/SCOOP powder Take 1 capful with water (17 g) by mouth daily. Patient taking differently: Take 17 g by mouth daily as needed for moderate constipation. 02/28/22    Love, Ivan Anchors, PA-C  potassium chloride SA (KLOR-CON M) 20 MEQ tablet Take 2 tablets (40 mEq total) by mouth 2 (two) times daily. 02/27/22   Bary Leriche, PA-C      Allergies    Patient has no known allergies.    Review of Systems   Review of Systems  Physical Exam Updated Vital Signs BP 101/73   Pulse (!) 117   Temp 98.3 F (36.8 C) (Oral)   Resp 18   LMP 01/16/2016   SpO2 99%   Physical Exam Vitals and nursing note reviewed.  Constitutional:      General: She is not in acute distress.    Appearance: She is well-developed.  HENT:     Head: Normocephalic and atraumatic.     Right Ear: External ear normal.     Left Ear: External ear normal.     Nose: Nose normal.  Eyes:     Conjunctiva/sclera: Conjunctivae normal.  Cardiovascular:     Rate and Rhythm: Regular rhythm. Tachycardia present.     Heart sounds: No murmur heard.    Comments: Mild tachycardia 105, regular rhythm Pulmonary:     Effort: No respiratory distress.     Breath sounds: No wheezing, rhonchi or rales.  Abdominal:     Palpations: Abdomen is soft.     Tenderness: There is no abdominal tenderness. There is no guarding or rebound.  Musculoskeletal:     Cervical back: Normal range of motion and neck supple.     Right lower leg: Tenderness present. Edema present.     Left lower leg: Tenderness present. Edema present.     Comments: Patient with 1+ pitting edema bilaterally, symmetric  Skin:    General: Skin is warm and dry.     Findings: No rash.  Neurological:     General: No focal deficit present.     Mental Status: She is alert. Mental status is at baseline.     Motor: No weakness.  Psychiatric:        Mood and Affect: Mood normal.     ED Results / Procedures / Treatments   Labs (all labs ordered are listed, but only abnormal results are displayed) Labs Reviewed  COMPREHENSIVE METABOLIC PANEL - Abnormal; Notable for the following components:      Result Value   Calcium 8.4 (*)    Total  Protein 5.7 (*)    Albumin 1.6 (*)    Alkaline Phosphatase 141 (*)    All other components within normal limits  CBC WITH DIFFERENTIAL/PLATELET - Abnormal; Notable for the following components:   RBC 3.74 (*)    Hemoglobin 11.1 (*)    HCT 34.7 (*)    RDW 15.9 (*)    Platelets 526 (*)    All other components within normal limits  TROPONIN I (HIGH SENSITIVITY)  TROPONIN I (HIGH SENSITIVITY)    EKG EKG Interpretation  Date/Time:  Wednesday Hartlee 03 2024 21:32:47 EDT Ventricular Rate:  110 PR Interval:  124 QRS Duration: 74 QT Interval:  346 QTC Calculation: 468 R Axis:   77 Text Interpretation: Sinus tachycardia Possible Lateral infarct , age undetermined T wave abnormality, consider inferior ischemia Abnormal ECG Confirmed by Quintella Reichert [phone]) on 04/30/2022 11:03:07 PM  Radiology DG Chest 2 View  Result Date: 04/30/2022 CLINICAL DATA:  Chest pain, left arm pain EXAM: CHEST - 2 VIEW COMPARISON:  03/22/2022 FINDINGS: Frontal and lateral views of the chest demonstrate a stable cardiac silhouette. No change in the left upper lobe consolidation and left apical cavitation seen on prior imaging. Right chest is clear. No effusion or pneumothorax. No acute bony abnormalities. IMPRESSION: 1. Stable left upper lobe consolidation and cavitation, likely sequela from prior pneumonia. 2. No new intrathoracic process. Electronically Signed   By: Randa Ngo M.D.   On: 04/30/2022 20:04    Procedures Procedures  {Document cardiac monitor, telemetry assessment procedure when appropriate:1}  Medications Ordered in ED Medications - No data to display  ED Course/ Medical Decision Making/ A&P    Patient seen and examined. History obtained directly from patient. Work-up including labs, imaging, EKG ordered in triage, if performed, were reviewed.    Labs/EKG: Independently reviewed and interpreted.  This included: CBC with normal white blood cell count, hemoglobin mildly low at 11.1 otherwise  platelet count 526; CMP with normal potassium, sodium, chloride, glucose is 71, creatinine is normal, protein level is low, stable since January; first troponin was 4, second pending.  Imaging: Independently visualized and interpreted.  This included: Chest x-ray with stable left upper lobe consolidation and cavitation.  Medications/Fluids: Ordered: IV fluids  Most recent vital signs reviewed and are as follows: BP 105/60   Pulse (!) 101   Temp 98.3 F (36.8 C) (Oral)   Resp 12   LMP 01/16/2016   SpO2 100%   Initial impression: Chronic nausea and vomiting, evaluation for chest pain which is currently improved.    {   Click here for ABCD2, HEART and other calculatorsREFRESH Note before signing :1}                          Medical Decision Making Amount and/or Complexity of Data Reviewed Labs: ordered. Radiology: ordered.   ***  {Document critical care time when appropriate:1} {Document review of labs and clinical decision tools ie heart score, Chads2Vasc2 etc:1}  {Document your independent review of radiology images, and any outside records:1} {Document your discussion with family members, caretakers, and with consultants:1} {Document social determinants of health affecting pt's care:1} {Document your decision making why or why not admission, treatments were needed:1} Final Clinical Impression(s) / ED Diagnoses Final diagnoses:  None    Rx / DC Orders ED Discharge Orders     None

## 2022-04-30 NOTE — ED Provider Notes (Signed)
[facility] Provider Note   CSN: [id number] Arrival date & time: 04/30/22  1823     History  Chief Complaint  Patient presents with   Chest Pain    Janet Hensley is a 52 y.o. female.  Patient presents to the emergency department today for evaluation of chest pain.  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  She states that she has been compliant with her medication and has been taking it regularly.  Patient reports coming to get checked for "several problems".  She does report difficulty with eating and drinking, frequent vomiting ongoing over the past several months associated with about 40 pounds weight loss.  She is due to have an ultrasound and upper endoscopy later in the month.  She states that she feels generally weak and dehydrated.  She also reports shin pain bilaterally and bilateral lower extremity swelling which is slightly worse than baseline.  Today she had generalized chest pain, mainly in the morning, that is now resolved.  No associated diaphoresis.  Pain was nonexertional.  It did radiate into her left arm.      Home Medications Prior to Admission medications   Medication Sig Start Date End Date Taking? Authorizing Provider  acetaminophen (TYLENOL) 160 MG/5ML solution Take 20.3 mLs (650 mg total) by mouth every 6 (six) hours. 01/08/22   Mick Sell, PA-C  alum & mag hydroxide-simeth (MAALOX PLUS) 400-400-40 MG/5ML suspension Take 5 mLs by mouth 3 (three) times daily before meals. 03/12/22   Gertie Gowda, DO  Apixaban Starter Pack, 10mg  and 5mg , (ELIQUIS DVT/PE STARTER PACK) Take 2 tablets  (10mg ) by mouth twice daily for 7 days, then 1 tablet (5mg ) twice daily 03/26/22   [place] Donnamarie Poag, DO  clonazePAM (KLONOPIN) 0.5 MG tablet Take 1 tablet (0.5 mg total) by mouth 3 (three) times daily. 02/27/22   Love, Ivan Anchors, PA-C  famotidine (PEPCID) 20 MG tablet Take 1 tablet (20 mg total) by mouth 2 (two) times daily. 03/26/22   [place] Eric J, DO  ipratropium-albuterol (DUONEB) 0.5-2.5 (3) MG/3ML SOLN Take 3 mLs by nebulization every 4 (four) hours as needed. Patient taking differently: Take 3 mLs by nebulization every 4 (four) hours as needed (sob/wheezing). 01/08/22   Mick Sell, PA-C  lubiprostone (AMITIZA) 24 MCG capsule Take 1 capsule (24 mcg total) by mouth 2 (two) times daily with a meal. 02/27/22   Love, Ivan Anchors, PA-C  melatonin 3 MG TABS tablet Take 1 tablet (3 mg total) by mouth at bedtime. Patient taking differently: Take 3 mg by mouth at bedtime as needed (sleep). 02/27/22   Love, Ivan Anchors, PA-C  metoCLOPramide (REGLAN) 5 MG tablet Take 1 tablet (5 mg total) by mouth 3 (three) times daily. 04/09/22   Armbruster, Carlota Raspberry, MD  midodrine (PROAMATINE) 10 MG tablet Take 1 tablet (10 mg total) by mouth 3 (three) times daily. 03/26/22   [place] Eric J, DO  ondansetron (ZOFRAN) 4 MG tablet Take 1 tablet (4 mg total) by mouth daily as needed for nausea or vomiting. Patient not taking: Reported on 04/09/2022 03/26/22 03/26/23  [place] Eric J, DO  oxyCODONE-acetaminophen (PERCOCET) 10-325 MG tablet  Take 1 tablet by mouth every 6 (six) hours as needed for pain.    [provider]  pantoprazole (PROTONIX) 40 MG tablet Take 1 tablet (40 mg total) by mouth 2 (two) times daily. 03/26/22 06/24/22  [place] Donnamarie Poag, DO  polyethylene glycol powder (GLYCOLAX/MIRALAX) 17 GM/SCOOP powder Take 1 capful with water (17 g) by mouth daily. Patient taking differently: Take 17 g by mouth daily as needed for moderate constipation. 02/28/22   Love, Ivan Anchors, PA-C  potassium chloride SA (KLOR-CON M) 20 MEQ tablet  Take 2 tablets (40 mEq total) by mouth 2 (two) times daily. 02/27/22   Bary Leriche, PA-C      Allergies    Patient has no known allergies.    Review of Systems   Review of Systems  Physical Exam Updated Vital Signs BP 101/73   Pulse (!) 117   Temp 98.3 F (36.8 C) (Oral)   Resp 18   LMP 01/16/2016   SpO2 99%   Physical Exam Vitals and nursing note reviewed.  Constitutional:      General: She is not in acute distress.    Appearance: She is well-developed.  HENT:     Head: Normocephalic and atraumatic.     Right Ear: External ear normal.     Left Ear: External ear normal.     Nose: Nose normal.  Eyes:     Conjunctiva/sclera: Conjunctivae normal.  Cardiovascular:     Rate and Rhythm: Regular rhythm. Tachycardia present.     Heart sounds: No murmur heard.    Comments: Mild tachycardia 105, regular rhythm Pulmonary:     Effort: No respiratory distress.     Breath sounds: No wheezing, rhonchi or rales.  Abdominal:     Palpations: Abdomen is soft.     Tenderness: There is no abdominal tenderness. There is no guarding or rebound.  Musculoskeletal:     Cervical back: Normal range of motion and neck supple.     Right lower leg: Tenderness present. Edema present.     Left lower leg: Tenderness present. Edema present.     Comments: Patient with 1+ pitting edema bilaterally, symmetric  Skin:    General: Skin is warm and dry.     Findings: No rash.  Neurological:     General: No focal deficit present.     Mental Status: She is alert. Mental status is at baseline.     Motor: No weakness.  Psychiatric:        Mood and Affect: Mood normal.    ED Results / Procedures / Treatments   Labs (all labs ordered are listed, but only abnormal results are displayed) Labs Reviewed  COMPREHENSIVE METABOLIC PANEL - Abnormal; Notable for the following components:      Result Value   Calcium 8.4 (*)    Total Protein 5.7 (*)    Albumin 1.6 (*)    Alkaline Phosphatase 141 (*)    All  other components within normal limits  CBC WITH DIFFERENTIAL/PLATELET - Abnormal; Notable for the following components:   RBC 3.74 (*)    Hemoglobin 11.1 (*)    HCT 34.7 (*)    RDW 15.9 (*)    Platelets 526 (*)    All other components within normal limits  TROPONIN I (HIGH SENSITIVITY)  TROPONIN I (HIGH SENSITIVITY)    EKG EKG Interpretation  Date/Time:  Wednesday [place] 03 2024 21:32:47 EDT Ventricular Rate:  110 PR Interval:  124 QRS Duration: 74 QT Interval:  346 QTC Calculation: 468 R Axis:   77 Text Interpretation: Sinus tachycardia Possible Lateral infarct , age undetermined T wave abnormality, consider inferior ischemia Abnormal ECG Confirmed by Quintella Reichert [phone]) on 04/30/2022 11:03:07 PM  Radiology DG Chest 2 View  Result Date: 04/30/2022 CLINICAL DATA:  Chest pain, left arm pain EXAM: CHEST - 2 VIEW COMPARISON:  03/22/2022 FINDINGS: Frontal and lateral views of the chest demonstrate a stable cardiac silhouette. No change in the left upper lobe consolidation and left apical cavitation seen on prior imaging. Right chest is clear. No effusion or pneumothorax. No acute bony abnormalities. IMPRESSION: 1. Stable left upper lobe consolidation and cavitation, likely sequela from prior pneumonia. 2. No new intrathoracic process. Electronically Signed   By: Randa Ngo M.D.   On: 04/30/2022 20:04    Procedures Procedures    Medications Ordered in ED Medications  apixaban (ELIQUIS) tablet 5 mg (5 mg Oral Given 05/01/22 0008)  lactated ringers bolus 2,000 mL (1,000 mLs Intravenous New Bag/Given 05/01/22 0248)  ondansetron (ZOFRAN) injection 4 mg (4 mg Intravenous Given 05/01/22 [id number])    ED Course/ Medical Decision Making/ A&P    Patient seen and examined. History obtained directly from patient. Work-up including labs, imaging, EKG ordered in triage, if performed, were reviewed.    Labs/EKG: Independently reviewed and interpreted.  This included: CBC with normal white blood  cell count, hemoglobin mildly low at 11.1 otherwise platelet count 526; CMP with normal potassium, sodium, chloride, glucose is 71, creatinine is normal, protein level is low, stable since January; first troponin was 4, second pending.  Imaging: Independently visualized and interpreted.  This included: Chest x-ray with stable left upper lobe consolidation and cavitation.  Medications/Fluids: Ordered: IV fluids  Most recent vital signs reviewed and are as follows: BP 105/60   Pulse (!) 101   Temp 98.3 F (36.8 C) (Oral)   Resp 12   LMP 01/16/2016   SpO2 100%   Initial impression: Chronic nausea and vomiting, evaluation for chest pain which is currently improved.  2:08 AM Reassessment performed. Patient appears stable.   Labs personally reviewed and interpreted including: 2nd trop neg.   Imaging: Ordered RUQ [name]  Reviewed pertinent lab work and imaging with patient at bedside. Questions answered.   Most current vital signs reviewed and are as follows: BP (!) 105/55   Pulse 99   Temp 98.3 F (36.8 C) (Oral)   Resp 16   LMP 01/16/2016   SpO2 100%   Plan: IV fluids pending. No indications for admission at this point.   5:10 AM Reassessment performed. Patient appears stable. Receiving IV fluids.  I had a good discussion with patient and family at bedside, they are understandably frustrated over recurrent vomiting and poor quality of life over the past several months.  Imaging personally visualized and interpreted including: Ultrasound, agree no gallstones  Reviewed pertinent lab work and imaging with patient at bedside. Questions answered.   Most current vital signs reviewed and are as follows: BP 117/65   Pulse 96   Temp 98 F (36.7 C)   Resp 10   LMP 01/16/2016   SpO2 100%   Plan: Complete fluids, likely DC home  5:53 AM   Plan: Discharge to home.   Prescriptions written for: None  Other home care instructions discussed: Continue home medications, bland  diet  Return and follow-up instructions: I encouraged patient to return to ED with severe chest pain, especially if the pain is crushing or pressure-like and spreads  to the arms, back, neck, or jaw, or if they have associated sweating, vomiting, or shortness of breath with the pain, or significant pain with activity. We discussed that the evaluation here today indicates a low-risk of serious cause of chest pain, including heart trouble or a blood clot, but no evaluation is perfect and chest pain can evolve with time. The patient verbalized understanding and agreed.  I encouraged patient to follow-up with their provider in the next 48 hours for recheck.                               Medical Decision Making Amount and/or Complexity of Data Reviewed Labs: ordered. Radiology: ordered.  Risk Prescription drug management.   For this patient's complaint of chest pain, the following emergent conditions were considered on the differential diagnosis: acute coronary syndrome, pulmonary embolism, pneumothorax, myocarditis, pericardial tamponade, aortic dissection, thoracic aortic aneurysm complication, esophageal perforation.   Other causes were also considered including: gastroesophageal reflux disease, musculoskeletal pain including costochondritis, pneumonia/pleurisy, herpes zoster, pericarditis.  In regards to possibility of ACS, patient has atypical features of pain, non-ischemic and unchanged EKG and negative troponin(s). Heart score was calculated to be 2.   In regards to possibility of PE, symptoms are atypical for PE and risk profile is low, making PE low likelihood.  In regards to nausea and vomiting, patient has been having difficulty using weight loss over the past several months.  This has been a challenge for the patient, however fortunately her electrolytes are normal, creatinine is normal.  She has had previous evaluation with CT scan, and symptoms are not different or worsened today.  Do  not feel that reimaging is indicated.  White blood cell count is normal.  She is in the midst of an outpatient workup with GI which I feel is appropriate.  She was due to have an ultrasound later today, however this was performed in the ED tonight showing normal-appearing gallbladder and hepatic steatosis.  I would encourage her to continue to follow-up with her GI providers as they are planning to do endoscopy when it is safe given her anticoagulation status.  The patient's vital signs, pertinent lab work and imaging were reviewed and interpreted as discussed in the ED course. Hospitalization was considered for further testing, treatments, or serial exams/observation. However as patient is well-appearing, has a stable exam, and reassuring studies today, I do not feel that they warrant admission at this time. This plan was discussed with the patient who verbalizes agreement and comfort with this plan and seems reliable and able to return to the Emergency Department with worsening or changing symptoms.           Final Clinical Impression(s) / ED Diagnoses Final diagnoses:  Nausea and vomiting, unspecified vomiting type  Chest pain, unspecified type    Rx / DC Orders ED Discharge Orders     None         Carlisle Cater, PA-C 05/01/22 0556    Quintella Reichert, MD 05/01/22 [id number]

## 2022-05-01 ENCOUNTER — Emergency Department (HOSPITAL_COMMUNITY): Payer: 59

## 2022-05-01 ENCOUNTER — Telehealth: Payer: Self-pay | Admitting: Gastroenterology

## 2022-05-01 MED ORDER — ONDANSETRON HCL 4 MG/2ML IJ SOLN
4.0000 mg | Freq: Once | INTRAMUSCULAR | Status: AC
  Administered 2022-05-01: 4 mg via INTRAVENOUS
  Filled 2022-05-01: qty 2

## 2022-05-01 MED ORDER — LACTATED RINGERS IV BOLUS
2000.0000 mL | Freq: Once | INTRAVENOUS | Status: AC
  Administered 2022-05-01: 1000 mL via INTRAVENOUS

## 2022-05-01 MED ORDER — OXYCODONE-ACETAMINOPHEN 5-325 MG PO TABS: 1.0000 | ORAL_TABLET | Freq: Once | ORAL | Status: DC

## 2022-05-01 NOTE — Telephone Encounter (Signed)
[name] appt and order cancelled.

## 2022-05-01 NOTE — Discharge Instructions (Signed)
Please read and follow all provided instructions.  Your diagnoses today include:  1. Nausea and vomiting, unspecified vomiting type   2. Chest pain, unspecified type     Tests performed today include: An EKG of your heart A chest x-ray Cardiac enzymes - a blood test for heart muscle damage Blood counts and electrolytes Ultrasound of your abdomen: Shows normal appearing gallbladder, some fatty liver Vital signs. See below for your results today.   Medications prescribed:  None  Take any prescribed medications only as directed.  Follow-up instructions: Please follow-up with your gastroenterologist and primary care doctor for continued evaluation and management of your symptoms.  Return instructions:  SEEK IMMEDIATE MEDICAL ATTENTION IF: You have severe chest pain, especially if the pain is crushing or pressure-like and spreads to the arms, back, neck, or jaw, or if you have sweating, nausea or vomiting, or trouble with breathing. THIS IS AN EMERGENCY. Do not wait to see if the pain will go away. Get medical help at once. Call 911. DO NOT drive yourself to the hospital.  Your chest pain gets worse and does not go away after a few minutes of rest.  You have an attack of chest pain lasting longer than what you usually experience.  You have significant dizziness, if you pass out, or have trouble walking.  You have chest pain not typical of your usual pain for which you originally saw your caregiver.  You have any other emergent concerns regarding your health.  Additional Information: Chest pain comes from many different causes. Your caregiver has diagnosed you as having chest pain that is not specific for one problem, but does not require admission.  You are at low risk for an acute heart condition or other serious illness.   Your vital signs today were: BP 117/65   Pulse 96   Temp 98 F (36.7 C)   Resp 10   LMP 01/16/2016   SpO2 100%  If your blood pressure (BP) was elevated  above 135/85 this visit, please have this repeated by your doctor within one month. --------------

## 2022-05-01 NOTE — Telephone Encounter (Signed)
Malory calling from [name] dept at [facility] looking to get that [name] appt cancelled due to patient coming into the ED last night and had one done this morning. Please advise

## 2022-05-01 NOTE — ED Notes (Signed)
Pt transported to ultrasound.

## 2022-05-02 ENCOUNTER — Ambulatory Visit: Payer: 59

## 2022-05-05 ENCOUNTER — Institutional Professional Consult (permissible substitution): Payer: Disability Insurance | Admitting: Student in an Organized Health Care Education/Training Program

## 2022-05-07 ENCOUNTER — Institutional Professional Consult (permissible substitution): Payer: Disability Insurance | Admitting: Student in an Organized Health Care Education/Training Program

## 2022-05-07 ENCOUNTER — Ambulatory Visit (INDEPENDENT_AMBULATORY_CARE_PROVIDER_SITE_OTHER): Payer: 59 | Admitting: Student in an Organized Health Care Education/Training Program

## 2022-05-07 ENCOUNTER — Encounter: Payer: Self-pay | Admitting: Student in an Organized Health Care Education/Training Program

## 2022-05-07 VITALS — BP 96/60 | HR 103 | Temp 97.6°F | Ht 63.0 in | Wt 154.0 lb

## 2022-05-07 DIAGNOSIS — R0602 Shortness of breath: Secondary | ICD-10-CM

## 2022-05-07 DIAGNOSIS — I2699 Other pulmonary embolism without acute cor pulmonale: Secondary | ICD-10-CM

## 2022-05-07 DIAGNOSIS — J151 Pneumonia due to Pseudomonas: Secondary | ICD-10-CM | POA: Diagnosis not present

## 2022-05-07 MED ORDER — APIXABAN 5 MG PO TABS
5.0000 mg | ORAL_TABLET | Freq: Two times a day (BID) | ORAL | 11 refills | Status: DC
Start: 2022-05-07 — End: 2023-03-31

## 2022-05-07 NOTE — Progress Notes (Signed)
Synopsis: Referred in for pneumonia and PE by Janet Hensley, Janet A, PA  Assessment & Plan:   #Cavitary Pneumonia #Pulmonary Embolism #Shortness of breath  Presents to establish care after a recent prolonged hospitalization secondary to severe left lung pneumonia, with cultures growing pseudomonas, stenotrophomonas, and a positive urinary antigen for Legionella. Patient required prolonged intubation and subsequent tracheostomy tube placement. She was successfully decannulated and has remained on room air. Exam is unremarkable and there's no sign of wheezing or stridor on auscultation.  Given the severity of the pneumonia and its cavitary nature, she will require a repeat CT scan of the chest to re-assess pulmonary parenchyma. I will hold off on ordering it at the moment and would rater obtain it at the 6 month mark following her last image. I will also obtain a pulmonary function test to assess her spirometry, lung volumes, and DLCO.  Finally, for her provoked PE, I will repeat her echocardiogram to assess for any signs of elevated RV pressure and screen for CTEPH. I did discuss with Janet Hensley that while I suspect she had provoked PE (given acute illness, pneumonia, etc..), I would recommend extending the duration of her anti-coagulation beyond the recommended 3 months, up to 6 months with consideration for a 12 month treatment course. I will discuss this with Janet Hensley on follow up pending the results from her workup.  Return in about 6 months (around 11/06/2022).  I spent 60 minutes caring for this patient today, including preparing to see the patient, obtaining a medical history , reviewing a separately obtained history, performing a medically appropriate examination and/or evaluation, counseling and educating the patient/family/caregiver, ordering medications, tests, or procedures, documenting clinical information in the electronic health record, and independently interpreting results (not  separately reported/billed) and communicating results to the patient/family/caregiver  Janet ChuteKhabib Aurelius Gildersleeve, MD [facility]  End of visit medications:  Meds ordered this encounter  Medications   apixaban (ELIQUIS) 5 MG TABS tablet    Sig: Take 1 tablet (5 mg total) by mouth 2 (two) times daily.    Dispense:  60 tablet    Refill:  11     Current Outpatient Medications:    acetaminophen (TYLENOL) 160 MG/5ML solution, Take 20.3 mLs (650 mg total) by mouth every 6 (six) hours., Disp: 120 mL, Rfl: 0   albuterol (VENTOLIN HFA) 108 (90 Base) MCG/ACT inhaler, Inhale 2 puffs into the lungs every 6 (six) hours as needed., Disp: , Rfl:    alum & mag hydroxide-simeth (MAALOX PLUS) 400-400-40 MG/5ML suspension, Take 5 mLs by mouth 3 (three) times daily before meals., Disp: 355 mL, Rfl: 0   apixaban (ELIQUIS) 5 MG TABS tablet, Take 1 tablet (5 mg total) by mouth 2 (two) times daily., Disp: 60 tablet, Rfl: 11   clonazePAM (KLONOPIN) 0.5 MG tablet, Take 1 tablet (0.5 mg total) by mouth 3 (three) times daily., Disp: 45 tablet, Rfl: 0   famotidine (PEPCID) 20 MG tablet, Take 1 tablet (20 mg total) by mouth 2 (two) times daily., Disp: 60 tablet, Rfl: 0   fluticasone-salmeterol (ADVAIR) 250-50 MCG/ACT AEPB, Inhale 1 puff into the lungs in the morning and at bedtime., Disp: , Rfl:    ipratropium-albuterol (DUONEB) 0.5-2.5 (3) MG/3ML SOLN, Take 3 mLs by nebulization every 4 (four) hours as needed. (Patient taking differently: Take 3 mLs by nebulization every 4 (four) hours as needed (sob/wheezing).), Disp: 360 mL, Rfl:    lubiprostone (AMITIZA) 24 MCG capsule, Take 1 capsule (24 mcg total) by  mouth 2 (two) times daily with a meal., Disp: 60 capsule, Rfl: 0   melatonin 3 MG TABS tablet, Take 1 tablet (3 mg total) by mouth at bedtime. (Patient taking differently: Take 3 mg by mouth at bedtime as needed (sleep).), Disp: 30 tablet, Rfl: 0   metoCLOPramide (REGLAN) 5 MG tablet, Take 1 tablet (5 mg  total) by mouth 3 (three) times daily., Disp: 90 tablet, Rfl: 1   midodrine (PROAMATINE) 10 MG tablet, Take 1 tablet (10 mg total) by mouth 3 (three) times daily., Disp: 90 tablet, Rfl: 0   ondansetron (ZOFRAN) 4 MG tablet, Take 1 tablet (4 mg total) by mouth daily as needed for nausea or vomiting., Disp: 30 tablet, Rfl: 1   oxyCODONE-acetaminophen (PERCOCET) 10-325 MG tablet, Take 1 tablet by mouth every 6 (six) hours as needed for pain., Disp: , Rfl:    pantoprazole (PROTONIX) 40 MG tablet, Take 1 tablet (40 mg total) by mouth 2 (two) times daily., Disp: 60 tablet, Rfl: 2   polyethylene glycol powder (GLYCOLAX/MIRALAX) 17 GM/SCOOP powder, Take 1 capful with water (17 g) by mouth daily. (Patient taking differently: Take 17 g by mouth daily as needed for moderate constipation.), Disp: 238 g, Rfl: 0   potassium chloride SA (KLOR-CON M) 20 MEQ tablet, Take 2 tablets (40 mEq total) by mouth 2 (two) times daily. (Patient not taking: Reported on 05/07/2022), Disp: 120 tablet, Rfl: 0   Subjective:   PATIENT ID: Janet Hensley GENDER: female DOB: 1970-09-11, MRN: [id number]  Chief Complaint  Patient presents with   pulmonary consult    SOB with exertion, prod cough with clear sputum and wheezing.     HPI  Patient is a 52 year old female presenting to clinic for posthospital discharge follow-up.  Patient was initially admitted to [facility] on 12/22/2021 for shortness of breath and cough. She was noted to be hypoxic and wheezy, requiring noninvasive positive pressure ventilation and broad-spectrum antibiotics.  Given failure to improve, she was intubated on 12/23/2021.  Imaging showed left lung pneumonia with near complete whiteout of the left lung.  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  Bronchoscopy performed on 12/25/2021 showed thick mucopurulent secretions the left tracheobronchial tree.  Respiratory cultures eventually grew Pseudomonas as well as stenotrophomonas  while Legionella urine antigen was positive.  Blood cultures were also positive for Rothia for which she was treated.  Patient's respiratory status continued to deteriorate with worsening hypoxemia and she was transferred to [facility] for consideration of ECMO.  She eventually underwent tracheostomy tube placement (01/04/2023) and her condition stabilized.  She was discharged to [facility] on 01/08/2022 where she was eventually decannulated.  She then represented to the emergency department on 03/22/2022 for shortness of breath and was admitted after being found to have pulmonary embolism.  CT scan of the chest with PE protocol was notable for acute emboli in the right upper and lower lobes.  She was started on DOAC and eventually discharged on 03/26/2022.  Patient continues to experience shortness of breath, but feels her symptoms are improved compared to prior. She is compliant with her medications, including the eliquis. Her cough is improved, and she remains somewhat short of breath with exertion.  Patient has a history of smoking, with around 20 pack years of smoking history. She reports having quit. Denies any other inhalational exposures.  Ancillary information including prior medications, full medical/surgical/family/social histories, and PFTs (when available) are listed below and have been reviewed.  Review of Systems  Constitutional:  Negative for chills, fever, malaise/fatigue and weight loss.  Respiratory:  Positive for cough and shortness of breath. Negative for hemoptysis, sputum production and wheezing.   Cardiovascular:  Positive for leg swelling. Negative for chest pain.  Skin:  Negative for rash.     Objective:   Vitals:   05/07/22 1530  BP: 96/60  Pulse: (!) 103  Temp: 97.6 F (36.4 C)  TempSrc: Temporal  SpO2: 99%  Weight: 154 lb (69.9 kg)  Height: 5\' 3"  (1.6 m)   99% on RA BMI Readings from Last 3 Encounters:  05/07/22 27.28 kg/m  04/09/22 27.28 kg/m   03/22/22 28.34 kg/m   Wt Readings from Last 3 Encounters:  05/07/22 154 lb (69.9 kg)  04/09/22 154 lb (69.9 kg)  03/22/22 160 lb (72.6 kg)    Physical Exam Constitutional:      Appearance: Normal appearance. She is normal weight.  HENT:     Head: Normocephalic.     Nose: Nose normal.     Mouth/Throat:     Mouth: Mucous membranes are moist.  Cardiovascular:     Rate and Rhythm: Normal rate and regular rhythm.     Pulses: Normal pulses.     Heart sounds: Normal heart sounds.  Pulmonary:     Effort: Pulmonary effort is normal.     Breath sounds: Normal breath sounds. No wheezing or rales.  Abdominal:     Palpations: Abdomen is soft.  Musculoskeletal:     Right lower leg: Edema present.     Left lower leg: Edema present.  Skin:    General: Skin is warm.  Neurological:     General: No focal deficit present.     Mental Status: She is alert and oriented to person, place, and time. Mental status is at baseline.     Ancillary Information    Past Medical History:  Diagnosis Date   AKI (acute kidney injury)    Aortic atherosclerosis    Asthma 01/15/2012   Chronic anemia    COPD (chronic obstructive pulmonary disease)    Hepatic steatosis    Narcotic abuse    Nephrolithiasis    Pulmonary embolism 2024   Thrombocytopenia      No family history on file.   Past Surgical History:  Procedure Laterality Date   BACK SURGERY     BIOPSY  02/13/2022   Procedure: BIOPSY;  Surgeon: Benancio Deeds, MD;  Location: [facility] ENDOSCOPY;  Service: Gastroenterology;;   CESAREAN SECTION     FLEXIBLE SIGMOIDOSCOPY N/A 02/13/2022   Procedure: FLEXIBLE SIGMOIDOSCOPY;  Surgeon: Benancio Deeds, MD;  Location: [facility] ENDOSCOPY;  Service: Gastroenterology;  Laterality: N/A;   LEFT HEART CATH AND CORONARY ANGIOGRAPHY N/A 12/24/2021   Procedure: LEFT HEART CATH AND CORONARY ANGIOGRAPHY;  Surgeon: Yvonne Kendall, MD;  Location: [facility] INVASIVE CV LAB;  Service: Cardiovascular;  Laterality:  N/A;   wrist surgery      Social History   Socioeconomic History   Marital status: Married    Spouse name: Not on file   Number of children: Not on file   Years of education: Not on file   Highest education level: Not on file  Occupational History   Not on file  Tobacco Use   Smoking status: Every Day    Packs/day: 0.50    Years: 35.00    Additional pack years: 0.00    Total pack years: 17.50    Types: Cigarettes   Smokeless tobacco: Never  Vaping Use   Vaping Use: Former  Substance and Sexual Activity   Alcohol use: Not on file   Drug use: Not on file   Sexual activity: Not on file  Other Topics Concern   Not on file  Social History Narrative   Not on file   Social Determinants of Health   Financial Resource Strain: Not on file  Food Insecurity: No Food Insecurity (03/23/2022)   Hunger Vital Sign    Worried About Running Out of Food in the Last Year: Never true    Ran Out of Food in the Last Year: Never true  Transportation Needs: No Transportation Needs (03/23/2022)   PRAPARE - Administrator, Civil Service (Medical): No    Lack of Transportation (Non-Medical): No  Physical Activity: Not on file  Stress: Not on file  Social Connections: Not on file  Intimate Partner Violence: Not At Risk (03/23/2022)   Humiliation, Afraid, Rape, and Kick questionnaire    Fear of Current or Ex-Partner: No    Emotionally Abused: No    Physically Abused: No    Sexually Abused: No     No Known Allergies   CBC    Component Value Date/Time   WBC 9.7 04/30/2022 1928   RBC 3.74 (L) 04/30/2022 1928   HGB 11.1 (L) 04/30/2022 1928   HGB 12.4 02/18/2013 0627   HCT 34.7 (L) 04/30/2022 1928   HCT 37.7 02/18/2013 0627   PLT 526 (H) 04/30/2022 1928   PLT 223 02/18/2013 0627   MCV 92.8 04/30/2022 1928   MCV 99 02/18/2013 0627   MCH 29.7 04/30/2022 1928   MCHC 32.0 04/30/2022 1928   RDW 15.9 (H) 04/30/2022 1928   RDW 13.5 02/18/2013 0627   LYMPHSABS 3.0 04/30/2022  1928   LYMPHSABS 2.2 02/18/2013 0627   MONOABS 0.7 04/30/2022 1928   MONOABS 0.5 02/18/2013 0627   EOSABS 0.0 04/30/2022 1928   EOSABS 0.2 02/18/2013 0627   BASOSABS 0.0 04/30/2022 1928   BASOSABS 0.1 02/18/2013 0627    Pulmonary Functions Testing Results:     No data to display          Outpatient Medications Prior to Visit  Medication Sig Dispense Refill   acetaminophen (TYLENOL) 160 MG/5ML solution Take 20.3 mLs (650 mg total) by mouth every 6 (six) hours. 120 mL 0   albuterol (VENTOLIN HFA) 108 (90 Base) MCG/ACT inhaler Inhale 2 puffs into the lungs every 6 (six) hours as needed.     alum & mag hydroxide-simeth (MAALOX PLUS) 400-400-40 MG/5ML suspension Take 5 mLs by mouth 3 (three) times daily before meals. 355 mL 0   clonazePAM (KLONOPIN) 0.5 MG tablet Take 1 tablet (0.5 mg total) by mouth 3 (three) times daily. 45 tablet 0   famotidine (PEPCID) 20 MG tablet Take 1 tablet (20 mg total) by mouth 2 (two) times daily. 60 tablet 0   fluticasone-salmeterol (ADVAIR) 250-50 MCG/ACT AEPB Inhale 1 puff into the lungs in the morning and at bedtime.     ipratropium-albuterol (DUONEB) 0.5-2.5 (3) MG/3ML SOLN Take 3 mLs by nebulization every 4 (four) hours as needed. (Patient taking differently: Take 3 mLs by nebulization every 4 (four) hours as needed (sob/wheezing).) 360 mL    lubiprostone (AMITIZA) 24 MCG capsule Take 1 capsule (24 mcg total) by mouth 2 (two) times daily with a meal. 60 capsule 0   melatonin 3 MG TABS tablet Take 1 tablet (3 mg total) by mouth at bedtime. (Patient taking differently: Take  3 mg by mouth at bedtime as needed (sleep).) 30 tablet 0   metoCLOPramide (REGLAN) 5 MG tablet Take 1 tablet (5 mg total) by mouth 3 (three) times daily. 90 tablet 1   midodrine (PROAMATINE) 10 MG tablet Take 1 tablet (10 mg total) by mouth 3 (three) times daily. 90 tablet 0   ondansetron (ZOFRAN) 4 MG tablet Take 1 tablet (4 mg total) by mouth daily as needed for nausea or vomiting. 30  tablet 1   oxyCODONE-acetaminophen (PERCOCET) 10-325 MG tablet Take 1 tablet by mouth every 6 (six) hours as needed for pain.     pantoprazole (PROTONIX) 40 MG tablet Take 1 tablet (40 mg total) by mouth 2 (two) times daily. 60 tablet 2   polyethylene glycol powder (GLYCOLAX/MIRALAX) 17 GM/SCOOP powder Take 1 capful with water (17 g) by mouth daily. (Patient taking differently: Take 17 g by mouth daily as needed for moderate constipation.) 238 g 0   Apixaban Starter Pack, 10mg  and 5mg , (ELIQUIS DVT/PE STARTER PACK) Take 2 tablets (10mg ) by mouth twice daily for 7 days, then 1 tablet (5mg ) twice daily 74 tablet 0   potassium chloride SA (KLOR-CON M) 20 MEQ tablet Take 2 tablets (40 mEq total) by mouth 2 (two) times daily. (Patient not taking: Reported on 05/07/2022) 120 tablet 0   No facility-administered medications prior to visit.

## 2022-05-12 ENCOUNTER — Encounter: Payer: 59 | Attending: Physical Medicine and Rehabilitation | Admitting: Physical Medicine and Rehabilitation

## 2022-05-12 DIAGNOSIS — R112 Nausea with vomiting, unspecified: Secondary | ICD-10-CM | POA: Insufficient documentation

## 2022-05-12 DIAGNOSIS — G7281 Critical illness myopathy: Secondary | ICD-10-CM | POA: Insufficient documentation

## 2022-05-12 DIAGNOSIS — G894 Chronic pain syndrome: Secondary | ICD-10-CM | POA: Insufficient documentation

## 2022-05-12 DIAGNOSIS — R0602 Shortness of breath: Secondary | ICD-10-CM | POA: Insufficient documentation

## 2022-05-13 ENCOUNTER — Ambulatory Visit (AMBULATORY_SURGERY_CENTER): Payer: 59

## 2022-05-13 VITALS — Ht 63.0 in | Wt 154.0 lb

## 2022-05-13 DIAGNOSIS — R112 Nausea with vomiting, unspecified: Secondary | ICD-10-CM

## 2022-05-13 DIAGNOSIS — R109 Unspecified abdominal pain: Secondary | ICD-10-CM

## 2022-05-13 NOTE — Progress Notes (Signed)
No egg or soy allergy known to patient  No issues known to pt with past sedation with any surgeries or procedures Patient denies ever being told they had issues or difficulty with intubation  No FH of Malignant Hyperthermia Pt is not on diet pills Pt is not on  home 02  Pt is not on blood thinners  Pt denies issues with constipation  No A fib or A flutter Have any cardiac testing pending--EKG may 15th Pt instructed to use Single[employer] or GoodRx for a price reduction on prep  Patient's chart reviewed by Cathlyn Parsons CNRA prior to previsit and patient appropriate for the [facility].  Previsit completed and red dot placed by patient's name on their procedure day (on provider's schedule).

## 2022-05-27 ENCOUNTER — Encounter: Payer: Self-pay | Admitting: Gastroenterology

## 2022-05-27 ENCOUNTER — Encounter (AMBULATORY_SURGERY_CENTER): Payer: 59 | Admitting: Gastroenterology

## 2022-05-27 VITALS — BP 124/66 | HR 100 | Temp 97.1°F | Resp 10 | Ht 63.0 in | Wt 154.0 lb

## 2022-05-27 DIAGNOSIS — R112 Nausea with vomiting, unspecified: Secondary | ICD-10-CM

## 2022-05-27 DIAGNOSIS — K2950 Unspecified chronic gastritis without bleeding: Secondary | ICD-10-CM | POA: Diagnosis not present

## 2022-05-27 MED ORDER — SODIUM CHLORIDE 0.9 % IV SOLN
4.0000 mg | Freq: Once | INTRAVENOUS | Status: AC
Start: 2022-05-27 — End: 2022-05-27
  Administered 2022-05-27: 4 mg via INTRAVENOUS

## 2022-05-27 MED ORDER — SODIUM CHLORIDE 0.9 % IV SOLN
500.0000 mL | INTRAVENOUS | Status: DC
Start: 2022-05-27 — End: 2022-05-27

## 2022-05-27 NOTE — Op Note (Signed)
[facility] Patient Name: Janet Hensley Procedure Date: 05/27/2022 3:28 PM MRN: [id number] Endoscopist: Viviann Spare P. Adela Lank , MD, [id number] Age: 52 Referring MD:  Date of Birth: February 14, 1970 Gender: Female Account #: [id number] Procedure:                Upper GI endoscopy Indications:              Nausea with vomiting - symptoms persistent despite                            Zofran / Reglan, protonix. RUQ [name] negative. Chronic                            narcotic use. Medicines:                Monitored Anesthesia Care Procedure:                Pre-Anesthesia Assessment:                           - Prior to the procedure, a History and Physical                            was performed, and patient medications and                            allergies were reviewed. The patient's tolerance of                            previous anesthesia was also reviewed. The risks                            and benefits of the procedure and the sedation                            options and risks were discussed with the patient.                            All questions were answered, and informed consent                            was obtained. Prior Anticoagulants: The patient has                            taken Eliquis (apixaban), last dose was day of                            procedure. ASA Grade Assessment: III - A patient                            with severe systemic disease. After reviewing the                            risks and benefits, the patient was deemed in  satisfactory condition to undergo the procedure.                           After obtaining informed consent, the endoscope was                            passed under direct vision. Throughout the                            procedure, the patient's blood pressure, pulse, and                            oxygen saturations were monitored continuously. The                            GIF [id number]  #[id number] was introduced through the                            mouth, and advanced to the second part of duodenum.                            The upper GI endoscopy was accomplished without                            difficulty. The patient tolerated the procedure                            well. Scope In: Scope Out: Findings:                 Esophagogastric landmarks were identified: the                            Z-line was found at 36 cm, the gastroesophageal                            junction was found at 36 cm and the upper extent of                            the gastric folds was found at 36 cm from the                            incisors.                           The exam of the esophagus was otherwise normal.                           The entire examined stomach was normal. Biopsies                            were taken with a cold forceps for Helicobacter                            pylori testing.  The examined duodenum was normal. Biopsies for                            histology were taken with a cold forceps for                            evaluation of celiac disease. Complications:            No immediate complications. Estimated blood loss:                            Minimal. Estimated Blood Loss:     Estimated blood loss was minimal. Impression:               - Esophagogastric landmarks identified.                           - Normal esophagus otherwise.                           - Normal stomach. Biopsied to rule out H pylori.                           - Normal examined duodenum. Biopsied to rule out                            celiac disease.                           No overt cause for nausea and vomiting on this                            exam. Would minimize narcotic use, evaluate for                            other more rare causes if biopsies unremarkable                            (adrenal insufficiency, etc) Recommendation:           -  Patient has a contact number available for                            emergencies. The signs and symptoms of potential                            delayed complications were discussed with the                            patient. Return to normal activities tomorrow.                            Written discharge instructions were provided to the                            patient.                           -  Resume previous diet.                           - Continue present medications.                           - Continue Eliquis                           - Minimize narcotic use if possible                           - Await pathology results with further                            recommendations Viviann Spare P. Kynli Chou, MD 05/27/2022 3:49:43 PM This report has been signed electronically.

## 2022-05-27 NOTE — Progress Notes (Signed)
Called to room to assist during endoscopic procedure.  Patient ID and intended procedure confirmed with present staff. Received instructions for my participation in the procedure from the performing physician.  

## 2022-05-27 NOTE — Progress Notes (Signed)
Zofran 4mg  given by Alease Frame RN via IV.

## 2022-05-27 NOTE — Progress Notes (Signed)
Pt's states no medical or surgical changes since previsit or office visit. 

## 2022-05-27 NOTE — Patient Instructions (Signed)
Continue Eliquis as prescribed.  Minimize narcotic use if possible.  YOU HAD AN ENDOSCOPIC PROCEDURE TODAY AT THE [facility]:   Refer to the procedure report that was given to you for any specific questions about what was found during the examination.  If the procedure report does not answer your questions, please call your gastroenterologist to clarify.  If you requested that your care partner not be given the details of your procedure findings, then the procedure report has been included in a sealed envelope for you to review at your convenience later.  YOU SHOULD EXPECT: Some feelings of bloating in the abdomen. Passage of more gas than usual.  Walking can help get rid of the air that was put into your GI tract during the procedure and reduce the bloating. If you had a lower endoscopy (such as a colonoscopy or flexible sigmoidoscopy) you may notice spotting of blood in your stool or on the toilet paper. If you underwent a bowel prep for your procedure, you may not have a normal bowel movement for a few days.  Please Note:  You might notice some irritation and congestion in your nose or some drainage.  This is from the oxygen used during your procedure.  There is no need for concern and it should clear up in a day or so.  SYMPTOMS TO REPORT IMMEDIATELY:  Following upper endoscopy (EGD)  Vomiting of blood or coffee ground material  New chest pain or pain under the shoulder blades  Painful or persistently difficult swallowing  New shortness of breath  Fever of 100F or higher  Black, tarry-looking stools  For urgent or emergent issues, a gastroenterologist can be reached at any hour by calling [phone]. Do not use MyChart messaging for urgent concerns.    DIET:  We do recommend a small meal at first, but then you may proceed to your regular diet.  Drink plenty of fluids but you should avoid alcoholic beverages for 24 hours.  ACTIVITY:  You should plan to take it easy for  the rest of today and you should NOT DRIVE or use heavy machinery until tomorrow (because of the sedation medicines used during the test).    FOLLOW UP: Our staff will call the number listed on your records the next business day following your procedure.  We will call around 7:15- 8:00 am to check on you and address any questions or concerns that you may have regarding the information given to you following your procedure. If we do not reach you, we will leave a message.     If any biopsies were taken you will be contacted by phone or by letter within the next 1-3 weeks.  Please call us at [phone] if you have not heard about the biopsies in 3 weeks.    SIGNATURES/CONFIDENTIALITY: You and/or your care partner have signed paperwork which will be entered into your electronic medical record.  These signatures attest to the fact that that the information above on your After Visit Summary has been reviewed and is understood.  Full responsibility of the confidentiality of this discharge information lies with you and/or your care-partner.

## 2022-05-27 NOTE — Progress Notes (Signed)
[facility] History and Physical   Primary Care Physician:  Gildardo Pounds, PA   Reason for Procedure:   Nausea / vomiting  Plan:    EGD     HPI: Sarin C Howdyshell is a 52 y.o. female  here for EGD to evaluate nausea / vomiting, upper tract symptoms. Exam done on Eliquis. Symptoms persistent. On Reglan empirically and Zofran, as well as protonix, she does not think it has helped much. EGD to further evaluate. Otherwise not in pain today, denies any new cardiopulmonary symptoms.   I have discussed risks / benefits of anesthesia and endoscopic procedure with Alley C Kathan and they wish to proceed with the exams as outlined today.    Past Medical History:  Diagnosis Date   AKI (acute kidney injury) (HCC)    Aortic atherosclerosis (HCC)    Asthma 01/15/2012   Chronic anemia    COPD (chronic obstructive pulmonary disease) (HCC)    GERD (gastroesophageal reflux disease)    Hepatic steatosis    Narcotic abuse (HCC)    Nephrolithiasis    Pulmonary embolism (HCC) 2024   Thrombocytopenia (HCC)     Past Surgical History:  Procedure Laterality Date   BACK SURGERY     BIOPSY  02/13/2022   Procedure: BIOPSY;  Surgeon: Benancio Deeds, MD;  Location: [facility];  Service: Gastroenterology;;   BRAIN SURGERY Right 2020   CESAREAN SECTION     FLEXIBLE SIGMOIDOSCOPY N/A 02/13/2022   Procedure: FLEXIBLE SIGMOIDOSCOPY;  Surgeon: Benancio Deeds, MD;  Location: [facility];  Service: Gastroenterology;  Laterality: N/A;   LEFT HEART CATH AND CORONARY ANGIOGRAPHY N/A 12/24/2021   Procedure: LEFT HEART CATH AND CORONARY ANGIOGRAPHY;  Surgeon: Yvonne Kendall, MD;  Location: [facility] INVASIVE CV LAB;  Service: Cardiovascular;  Laterality: N/A;   wrist surgery      Prior to Admission medications   Medication Sig Start Date End Date Taking? Authorizing Provider  albuterol (VENTOLIN HFA) 108 (90 Base) MCG/ACT inhaler Inhale 2 puffs into the lungs every 6 (six) hours as  needed.   Yes [provider]  apixaban (ELIQUIS) 5 MG TABS tablet Take 1 tablet (5 mg total) by mouth 2 (two) times daily. 05/07/22  Yes Dgayli, Lianne Bushy, MD  clonazePAM (KLONOPIN) 0.5 MG tablet Take 1 tablet (0.5 mg total) by mouth 3 (three) times daily. 02/27/22  Yes Love, Evlyn Kanner, PA-C  famotidine (PEPCID) 20 MG tablet Take 1 tablet (20 mg total) by mouth 2 (two) times daily. 03/26/22  Yes [place], Eric J, DO  fluticasone-salmeterol (ADVAIR) 250-50 MCG/ACT AEPB Inhale 1 puff into the lungs in the morning and at bedtime. 03/17/18  Yes [provider]  gabapentin (NEURONTIN) 100 MG capsule Take by mouth.   Yes [provider]  lubiprostone (AMITIZA) 24 MCG capsule Take 1 capsule (24 mcg total) by mouth 2 (two) times daily with a meal. 02/27/22  Yes Love, Evlyn Kanner, PA-C  metoCLOPramide (REGLAN) 5 MG tablet Take 1 tablet (5 mg total) by mouth 3 (three) times daily. 04/09/22  Yes Cheryle Dark, Willaim Rayas, MD  ondansetron (ZOFRAN) 4 MG tablet Take 1 tablet (4 mg total) by mouth daily as needed for nausea or vomiting. 03/26/22 03/26/23 Yes [place], Alvira Philips, DO  oxyCODONE-acetaminophen (PERCOCET) 10-325 MG tablet Take 1 tablet by mouth every 6 (six) hours as needed for pain.   Yes [provider]  pantoprazole (PROTONIX) 40 MG tablet Take 1 tablet (40 mg total) by mouth 2 (two) times daily. 03/26/22 06/24/22  Yes [place], Alvira Philips, DO  acetaminophen (TYLENOL) 160 MG/5ML solution Take 20.3 mLs (650 mg total) by mouth every 6 (six) hours. 01/08/22   Lidia Collum, PA-C  alum & mag hydroxide-simeth (MAALOX PLUS) 400-400-40 MG/5ML suspension Take 5 mLs by mouth 3 (three) times daily before meals. 03/12/22   Elijah Birk C, DO  ipratropium-albuterol (DUONEB) 0.5-2.5 (3) MG/3ML SOLN Take 3 mLs by nebulization every 4 (four) hours as needed. Patient taking differently: Take 3 mLs by nebulization every 4 (four) hours as needed (sob/wheezing). 01/08/22   Lidia Collum, PA-C  melatonin 3 MG TABS  tablet Take 1 tablet (3 mg total) by mouth at bedtime. Patient taking differently: Take 3 mg by mouth at bedtime as needed (sleep). 02/27/22   Love, Evlyn Kanner, PA-C  midodrine (PROAMATINE) 10 MG tablet Take 1 tablet (10 mg total) by mouth 3 (three) times daily. Patient not taking: Reported on 05/13/2022 03/26/22   [place], Alvira Philips, DO  polyethylene glycol powder (GLYCOLAX/MIRALAX) 17 GM/SCOOP powder Take 1 capful with water (17 g) by mouth daily. Patient not taking: Reported on 05/13/2022 02/28/22   Jacquelynn Cree, PA-C  potassium chloride SA (KLOR-CON M) 20 MEQ tablet Take 2 tablets (40 mEq total) by mouth 2 (two) times daily. Patient not taking: Reported on 05/07/2022 02/27/22   Jacquelynn Cree, PA-C    Current Outpatient Medications  Medication Sig Dispense Refill   albuterol (VENTOLIN HFA) 108 (90 Base) MCG/ACT inhaler Inhale 2 puffs into the lungs every 6 (six) hours as needed.     apixaban (ELIQUIS) 5 MG TABS tablet Take 1 tablet (5 mg total) by mouth 2 (two) times daily. 60 tablet 11   clonazePAM (KLONOPIN) 0.5 MG tablet Take 1 tablet (0.5 mg total) by mouth 3 (three) times daily. 45 tablet 0   famotidine (PEPCID) 20 MG tablet Take 1 tablet (20 mg total) by mouth 2 (two) times daily. 60 tablet 0   fluticasone-salmeterol (ADVAIR) 250-50 MCG/ACT AEPB Inhale 1 puff into the lungs in the morning and at bedtime.     gabapentin (NEURONTIN) 100 MG capsule Take by mouth.     lubiprostone (AMITIZA) 24 MCG capsule Take 1 capsule (24 mcg total) by mouth 2 (two) times daily with a meal. 60 capsule 0   metoCLOPramide (REGLAN) 5 MG tablet Take 1 tablet (5 mg total) by mouth 3 (three) times daily. 90 tablet 1   ondansetron (ZOFRAN) 4 MG tablet Take 1 tablet (4 mg total) by mouth daily as needed for nausea or vomiting. 30 tablet 1   oxyCODONE-acetaminophen (PERCOCET) 10-325 MG tablet Take 1 tablet by mouth every 6 (six) hours as needed for pain.     pantoprazole (PROTONIX) 40 MG tablet Take 1 tablet (40 mg total) by  mouth 2 (two) times daily. 60 tablet 2   acetaminophen (TYLENOL) 160 MG/5ML solution Take 20.3 mLs (650 mg total) by mouth every 6 (six) hours. 120 mL 0   alum & mag hydroxide-simeth (MAALOX PLUS) 400-400-40 MG/5ML suspension Take 5 mLs by mouth 3 (three) times daily before meals. 355 mL 0   ipratropium-albuterol (DUONEB) 0.5-2.5 (3) MG/3ML SOLN Take 3 mLs by nebulization every 4 (four) hours as needed. (Patient taking differently: Take 3 mLs by nebulization every 4 (four) hours as needed (sob/wheezing).) 360 mL    melatonin 3 MG TABS tablet Take 1 tablet (3 mg total) by mouth at bedtime. (Patient taking differently: Take 3 mg by mouth at bedtime as needed (sleep).) 30 tablet 0  midodrine (PROAMATINE) 10 MG tablet Take 1 tablet (10 mg total) by mouth 3 (three) times daily. (Patient not taking: Reported on 05/13/2022) 90 tablet 0   polyethylene glycol powder (GLYCOLAX/MIRALAX) 17 GM/SCOOP powder Take 1 capful with water (17 g) by mouth daily. (Patient not taking: Reported on 05/13/2022) 238 g 0   potassium chloride SA (KLOR-CON M) 20 MEQ tablet Take 2 tablets (40 mEq total) by mouth 2 (two) times daily. (Patient not taking: Reported on 05/07/2022) 120 tablet 0   Current Facility-Administered Medications  Medication Dose Route Frequency Provider Last Rate Last Admin   0.9 %  sodium chloride infusion  500 mL Intravenous Continuous Taisei Bonnette, Willaim Rayas, MD        Allergies as of 05/27/2022   (No Known Allergies)    Family History  Problem Relation Age of Onset   Colon cancer Neg Hx    Colon polyps Neg Hx    Esophageal cancer Neg Hx    Rectal cancer Neg Hx    Stomach cancer Neg Hx     Social History   Socioeconomic History   Marital status: Married    Spouse name: Not on file   Number of children: Not on file   Years of education: Not on file   Highest education level: Not on file  Occupational History   Not on file  Tobacco Use   Smoking status: Every Day    Packs/day: 0.50     Years: 35.00    Additional pack years: 0.00    Total pack years: 17.50    Types: Cigarettes   Smokeless tobacco: Never  Vaping Use   Vaping Use: Former  Substance and Sexual Activity   Alcohol use: Not Currently   Drug use: Not Currently   Sexual activity: Not on file  Other Topics Concern   Not on file  Social History Narrative   Not on file   Social Determinants of Health   Financial Resource Strain: Not on file  Food Insecurity: No Food Insecurity (03/23/2022)   Hunger Vital Sign    Worried About Running Out of Food in the Last Year: Never true    Ran Out of Food in the Last Year: Never true  Transportation Needs: No Transportation Needs (03/23/2022)   PRAPARE - Administrator, Civil Service (Medical): No    Lack of Transportation (Non-Medical): No  Physical Activity: Not on file  Stress: Not on file  Social Connections: Not on file  Intimate Partner Violence: Not At Risk (03/23/2022)   Humiliation, Afraid, Rape, and Kick questionnaire    Fear of Current or Ex-Partner: No    Emotionally Abused: No    Physically Abused: No    Sexually Abused: No    Review of Systems: All other review of systems negative except as mentioned in the HPI.  Physical Exam: Vital signs BP 95/61   Pulse 95   Temp (!) 97.1 F (36.2 C)   Resp 13   Ht 5\' 3"  (1.6 m)   Wt 154 lb (69.9 kg)   LMP 01/16/2016   SpO2 100%   BMI 27.28 kg/m   General:   Alert,  Well-developed, pleasant and cooperative in NAD Lungs:  Clear throughout to auscultation.   Heart:  Regular rate and rhythm Abdomen:  Soft, nontender and nondistended.   Neuro/Psych:  Alert and cooperative. Normal mood and affect. A and O x 3  Harlin Rain, MD [facility]

## 2022-05-27 NOTE — Progress Notes (Signed)
Report to PACU, RN, vss, BBS= Clear.  

## 2022-05-28 ENCOUNTER — Telehealth: Payer: Self-pay | Admitting: Student in an Organized Health Care Education/Training Program

## 2022-05-28 ENCOUNTER — Telehealth: Payer: Self-pay | Admitting: *Deleted

## 2022-05-28 NOTE — Telephone Encounter (Signed)
Dr. Dgayli you saw Mrs. Kinslow on 4/10 and order an echo to be done which is scheduled on 06/11/22. I was working on getting the Auth for the echo through [employer] using the codes 93306 for echo, C8929 for 2D, 76376 for 3D. PA is not required for 93306 and C8929 is none billable and is included with 93306. For the code 76376 they wanted to know the reason for the medical necessity of the 3D part. The previous echo done 03/23/22 was done as 2D echo. Do you have any other reasons as to why 3D should be done versus 2D? Please let me know and I will proceed with your information or withdraw the code 76376  

## 2022-05-28 NOTE — Telephone Encounter (Signed)
Dr. Aundria Rud you saw Janet Hensley on 4/10 and order an echo to be done which is scheduled on 06/11/22. I was working on getting the Auth for the echo through [employer] using the codes 16109 for echo, [id number] for 2D, [id number] for 3D. PA is not required for 60454 and [phone] is none billable and is included with 93306. For the code 91478 they wanted to know the reason for the medical necessity of the 3D part. The previous echo done 03/23/22 was done as 2D echo. Do you have any other reasons as to why 3D should be done versus 2D? Please let me know and I will proceed with your information or withdraw the code 29562

## 2022-05-28 NOTE — Telephone Encounter (Signed)
  Follow up Call-     05/27/2022    2:55 PM  Call back number  Post procedure Call Back phone  # [phone]  Permission to leave phone message Yes     Patient questions:  Do you have a fever, pain , or abdominal swelling? No. Pain Score  0 *  Have you tolerated food without any problems? Yes.    Have you been able to return to your normal activities? Yes.    Do you have any questions about your discharge instructions: Diet   No. Medications  No. Follow up visit  No.  Do you have questions or concerns about your Care? No.  Actions: * If pain score is 4 or above: No action needed, pain <4.

## 2022-05-28 NOTE — Telephone Encounter (Signed)
I think code 08657 is a new CPT code introduced in 2022 for echocardiogram billing. It's helpful for the cardiologist to be able to assess cardiac chambers, and in her situation we need to see if her RV is enlarged or not as a consequence of her PE. Thank you

## 2022-05-28 NOTE — Telephone Encounter (Signed)
Received a call from [employer] company about the prior auth that was done on the echocardiogram that was ordered. They said that it looks like wrong codes were placed when the auth was done and they are wanting to have a phone call to have this further discussed.   Phone number for the direct line is [phone].  Routing to PCCs.

## 2022-05-30 NOTE — Telephone Encounter (Signed)
I have called Janet Hensley with [employer] back and left message for her to return my call. Janet Hensley just return my call and she has made notes of Dr. Doreene Adas note and will forward to the Medical team to review and when they make a decision they will let us know

## 2022-06-03 ENCOUNTER — Encounter: Payer: Self-pay | Admitting: Gastroenterology

## 2022-06-03 NOTE — Telephone Encounter (Signed)
Dr. Aundria Rud I just received the denial for the code 7376 for the echo scheduled on 06/11/22. They want to know if you would be willing to do peer to peer for the patient and I will have call and get it schedule.  If and when would be a good time for you to do the peer to peer On May 3rd  I have called Lillia Abed with [employer] back and left message for her to return my call. Lillia Abed just return my call and she has made notes of Dr. Doreene Adas note and will forward to the Medical team to review and when they make a decision they will let us know

## 2022-06-06 NOTE — Telephone Encounter (Signed)
I spoke with Lillia Abed again today and she states they have denied the code 16109 even after I called and explained what you told me. I have now scheduled peer to peer for 5/16/224 between 12:30-1:30pm for this patient not sure who will be calling the case has been assigned to Medical Director work que

## 2022-06-09 DIAGNOSIS — Z86711 Personal history of pulmonary embolism: Secondary | ICD-10-CM | POA: Insufficient documentation

## 2022-06-11 ENCOUNTER — Ambulatory Visit: Payer: 59

## 2022-06-12 NOTE — Telephone Encounter (Signed)
Peer to peer was CXL patient no showed for echo appt on 06/11/22

## 2022-06-14 ENCOUNTER — Other Ambulatory Visit (HOSPITAL_COMMUNITY): Payer: Self-pay

## 2022-06-16 ENCOUNTER — Telehealth: Payer: Self-pay | Admitting: Gastroenterology

## 2022-06-16 NOTE — Telephone Encounter (Signed)
Patient calling states she is still heavily vomiting says she needs ann appointment and cannot wait until July/ August. Please advise

## 2022-06-16 NOTE — Telephone Encounter (Signed)
Brooklyn can you please ask her the following: - we had discussed her taking Reglan 5mg  TID, scheduled Zofran BID to TID, PPI - minimize narcotic use  Is she taking narcotics still? If so, she really needs to cut back. Is she taking her medications? I noted in the ED she tested positive for Cannibis. Can you clarify how much she uses? If using this routinely this can cause nausea / vomiting and we recommend avoidance of all cannibis when having these symptoms.   I am out of the office this week working in the hospital. Not sure if any of the PAs have any openings to get her in the next few weeks?

## 2022-06-17 NOTE — Telephone Encounter (Signed)
Lm on vm for patient to return call.   Tentatively scheduled patient for a follow up with Alcide Evener, NP on 06/24/22 at 2:30 pm.

## 2022-06-19 NOTE — Telephone Encounter (Signed)
Called and spoke with patient. Patient has been instructed on how she should be taking her medications. Pt states that she has reduced narcotic use to about 3 x/day. Pt states that she is no longer using cannabis, she only did this to see if it would help her get her appetite back. Patient has been advised to avoid cannabis completley. Pt states that she is still vomiting but it is not as bad. Patient has been advised of recommendations and is aware of her f/u appt. Pt verbalized understanding and had no concerns at the end of the call.

## 2022-06-24 ENCOUNTER — Ambulatory Visit: Payer: 59 | Admitting: Nurse Practitioner

## 2022-08-11 IMAGING — MG MM DIGITAL SCREENING BILAT W/ TOMO AND CAD
8 series · 8 of 24 positions shown · non-contrast
Comparison: None.

CLINICAL DATA: Screening.

EXAM:
DIGITAL SCREENING BILATERAL MAMMOGRAM WITH TOMOSYNTHESIS AND CAD
TECHNIQUE: Bilateral screening digital craniocaudal and mediolateral oblique
mammograms were obtained. Bilateral screening digital breast
tomosynthesis was performed. The images were evaluated with
computer-aided detection.

[R CC synth-2D]
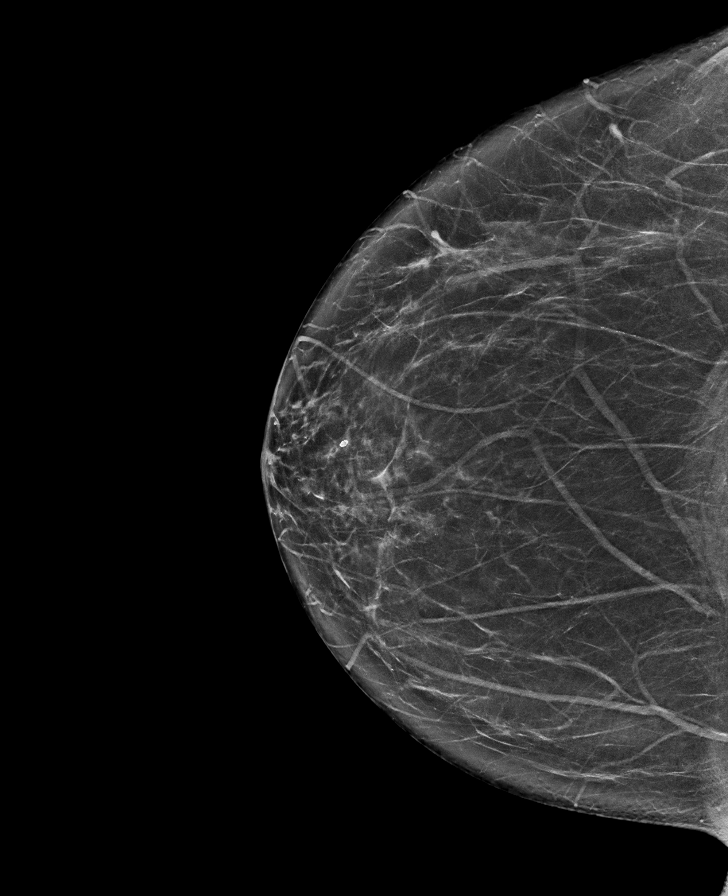

[L MLO synth-2D]
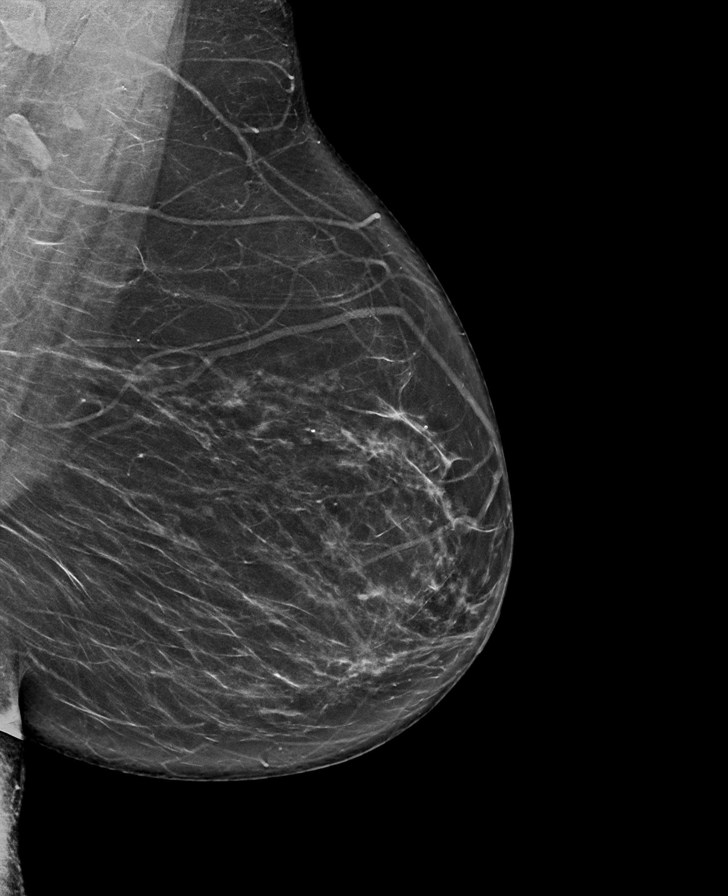

[L CC synth-2D]
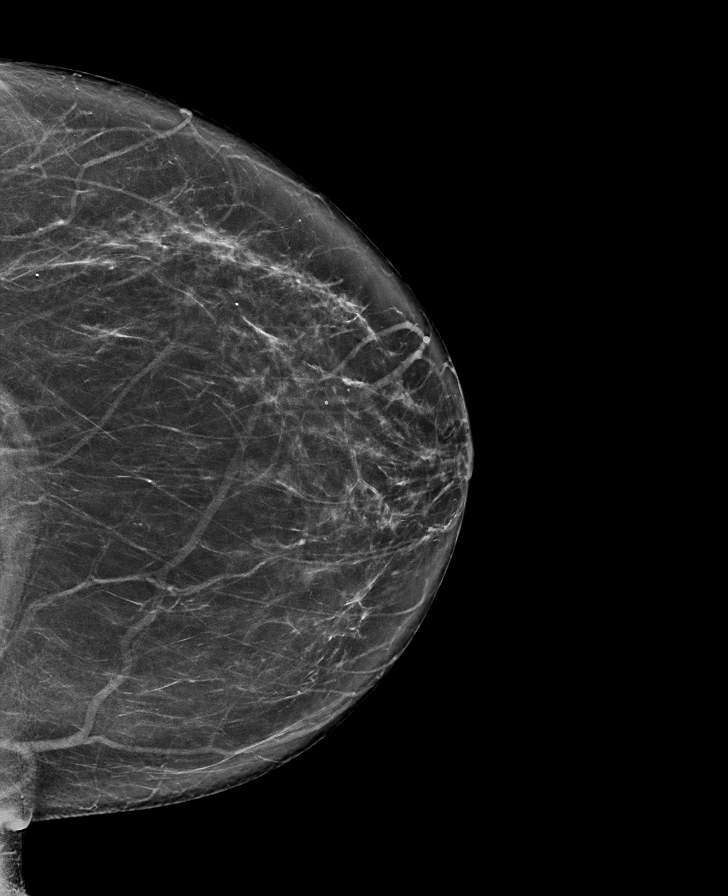

[R MLO synth-2D]
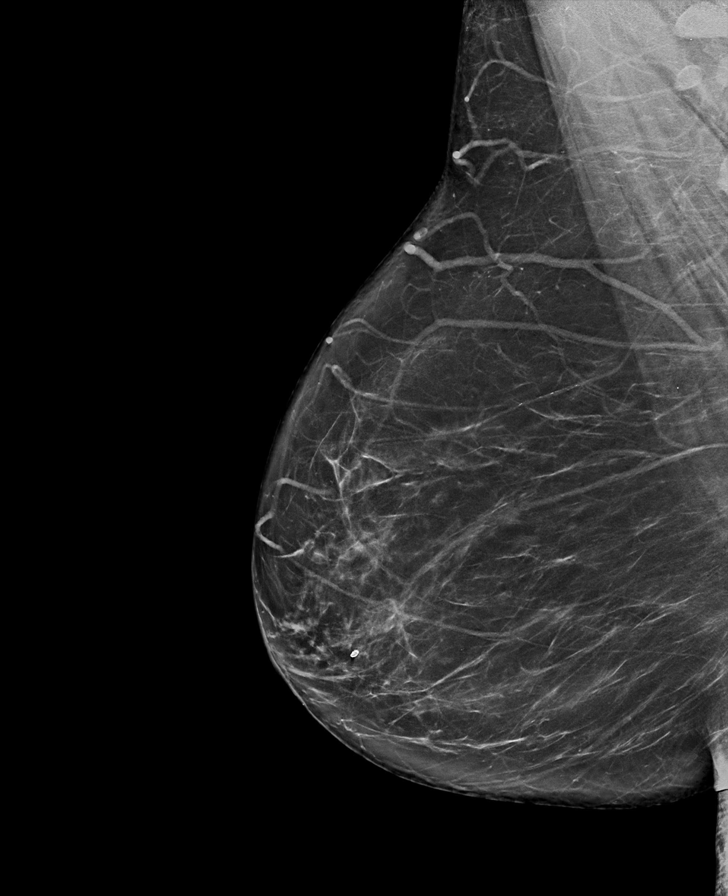

[L CC tomo · tomo slice 40/79.0]
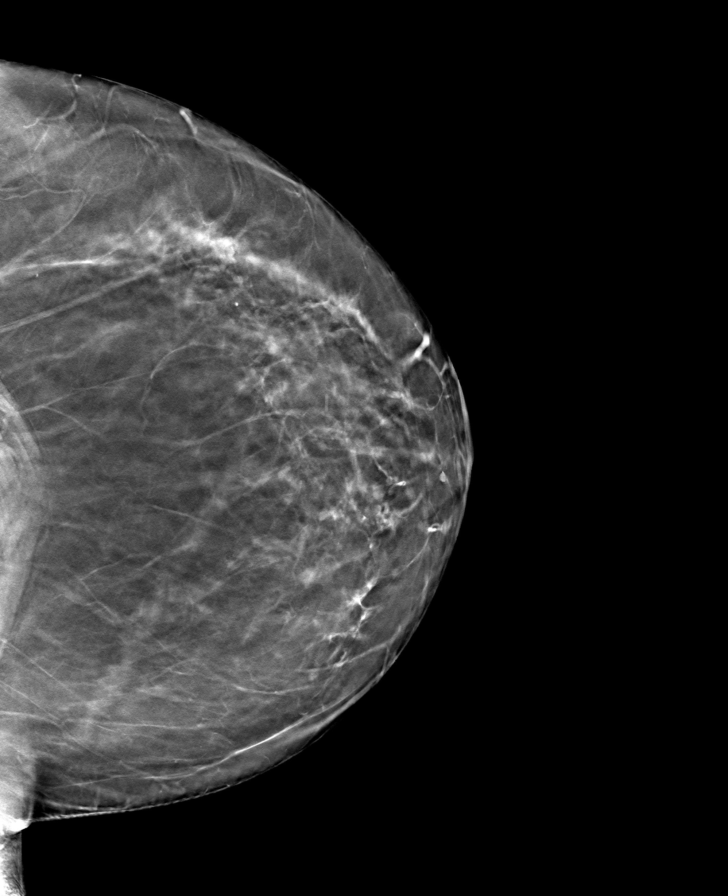

[L MLO tomo · tomo slice 43/84.0]
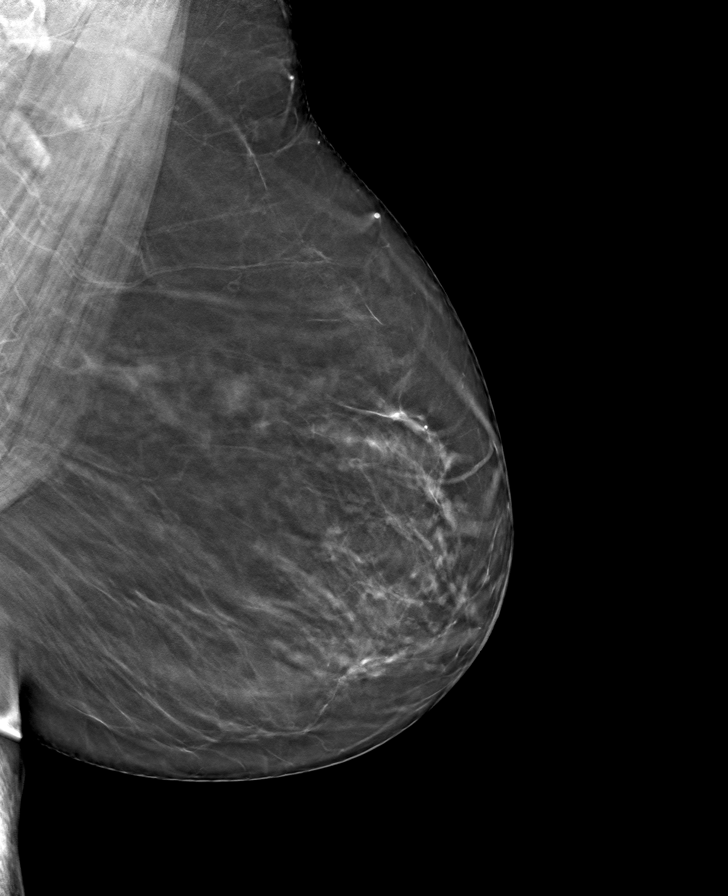

[R CC tomo · tomo slice 37/74.0]
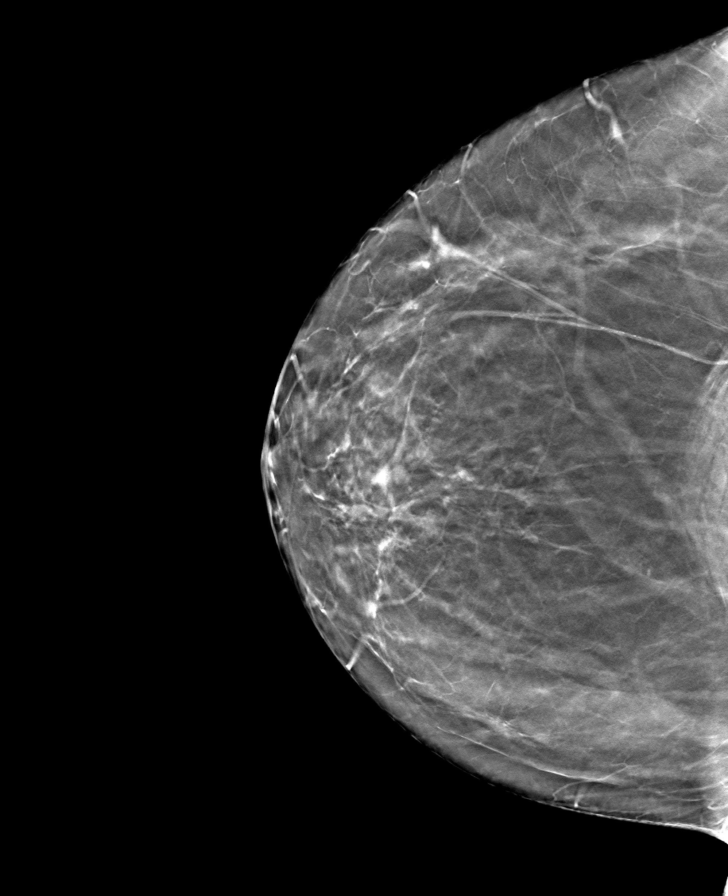

[R MLO tomo · tomo slice 43/84.0]
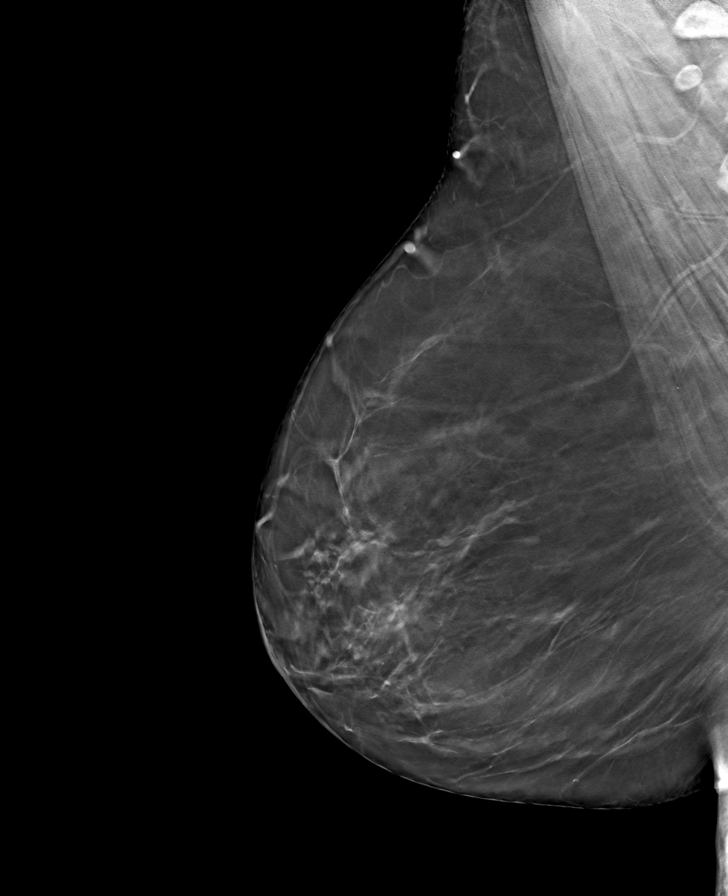

[8 of 24 positions shown; findings below may reference images not displayed]

ACR Breast Density Category b: There are scattered areas of
fibroglandular density.
FINDINGS: There are no findings suspicious for malignancy. The images were
evaluated with computer-aided detection.
IMPRESSION: No mammographic evidence of malignancy. A result letter of this
screening mammogram will be mailed directly to the patient.

RECOMMENDATION:
Screening mammogram in one year. (Code:C7-6-ASJ)

BI-RADS CATEGORY  1: Negative.

## 2022-09-02 ENCOUNTER — Telehealth: Payer: Self-pay | Admitting: Nurse Practitioner

## 2022-09-02 NOTE — Telephone Encounter (Signed)
Patient's appt is not until Friday. Patient will need to reschedule and/or go to the ED or urgent care.

## 2022-09-02 NOTE — Telephone Encounter (Signed)
Inbound call from patient stating she is feeling very sick this morning and is unable to come in for appointment at 9 today. Patient is requesting to know if she is able to do a virtual appointment. Requesting a call back. Please advise, thank you.

## 2022-09-05 ENCOUNTER — Ambulatory Visit: Payer: 59 | Admitting: Nurse Practitioner

## 2022-09-15 DIAGNOSIS — K861 Other chronic pancreatitis: Secondary | ICD-10-CM | POA: Insufficient documentation

## 2022-09-23 ENCOUNTER — Other Ambulatory Visit: Payer: Self-pay | Admitting: Family Medicine

## 2022-09-23 DIAGNOSIS — K8689 Other specified diseases of pancreas: Secondary | ICD-10-CM

## 2022-10-13 ENCOUNTER — Encounter: Payer: Self-pay | Admitting: Family Medicine

## 2022-10-16 ENCOUNTER — Other Ambulatory Visit: Payer: 59

## 2022-10-29 ENCOUNTER — Ambulatory Visit: Payer: 59 | Admitting: Student in an Organized Health Care Education/Training Program

## 2022-11-11 ENCOUNTER — Telehealth: Payer: Self-pay | Admitting: Gastroenterology

## 2022-11-11 ENCOUNTER — Ambulatory Visit: Payer: 59 | Admitting: Gastroenterology

## 2022-11-11 ENCOUNTER — Telehealth: Payer: Self-pay

## 2022-11-11 NOTE — Telephone Encounter (Signed)
PT has an appointment today but has no ride due to the fact that her driver has been admitted into hospital. She is requesting that appointment be over the phone if possible. Please advise.

## 2022-11-11 NOTE — Telephone Encounter (Signed)
Patient called in wanting to know what time is her appointment. The patient did ask what she needs to do before she come in or what she need to bring. I did inform her that she is a patient of [facility] and she will have to call them I am unable to give any information. I gave her the number to call.

## 2022-11-11 NOTE — Telephone Encounter (Signed)
PT has been rescheduled for January. I inquired about her [facility] visit. She advised that she told them she wanted to stay with Dr. Adela Hensley. She didn't seem as if she wanted to give " factual details" on the appointment.

## 2022-11-14 ENCOUNTER — Other Ambulatory Visit: Payer: 59

## 2022-11-18 ENCOUNTER — Telehealth: Payer: Self-pay | Admitting: Student in an Organized Health Care Education/Training Program

## 2022-11-18 ENCOUNTER — Ambulatory Visit: Payer: 59 | Admitting: Student in an Organized Health Care Education/Training Program

## 2022-11-18 NOTE — Telephone Encounter (Signed)
Patient is calling because CT Scan was not approve. She was told to call back to get it scheduled.

## 2022-11-20 ENCOUNTER — Other Ambulatory Visit: Payer: Self-pay

## 2022-11-20 DIAGNOSIS — R0602 Shortness of breath: Secondary | ICD-10-CM

## 2022-12-09 ENCOUNTER — Ambulatory Visit: Payer: 59 | Attending: Student in an Organized Health Care Education/Training Program

## 2022-12-09 ENCOUNTER — Ambulatory Visit
Admission: RE | Admit: 2022-12-09 | Discharge: 2022-12-09 | Disposition: A | Discharge status: Home / Self Care | Payer: 59 | Source: Ambulatory Visit | Attending: Student in an Organized Health Care Education/Training Program | Admitting: Student in an Organized Health Care Education/Training Program

## 2022-12-09 DIAGNOSIS — F17210 Nicotine dependence, cigarettes, uncomplicated: Secondary | ICD-10-CM | POA: Diagnosis not present

## 2022-12-09 DIAGNOSIS — R0602 Shortness of breath: Secondary | ICD-10-CM | POA: Insufficient documentation

## 2022-12-09 DIAGNOSIS — J151 Pneumonia due to Pseudomonas: Secondary | ICD-10-CM

## 2022-12-16 ENCOUNTER — Ambulatory Visit (INDEPENDENT_AMBULATORY_CARE_PROVIDER_SITE_OTHER): Payer: 59 | Admitting: Student in an Organized Health Care Education/Training Program

## 2022-12-16 ENCOUNTER — Encounter: Payer: Self-pay | Admitting: Student in an Organized Health Care Education/Training Program

## 2022-12-16 VITALS — BP 104/72 | HR 84 | Temp 98.2°F | Ht 63.0 in | Wt 123.8 lb

## 2022-12-16 DIAGNOSIS — I2699 Other pulmonary embolism without acute cor pulmonale: Secondary | ICD-10-CM | POA: Diagnosis not present

## 2022-12-16 DIAGNOSIS — J151 Pneumonia due to Pseudomonas: Secondary | ICD-10-CM | POA: Diagnosis not present

## 2022-12-16 DIAGNOSIS — R0602 Shortness of breath: Secondary | ICD-10-CM

## 2022-12-16 LAB — PULMONARY FUNCTION TEST ARMC ONLY
DL/VA % pred: 75 %
DL/VA: 3.24 ml/min/mmHg/L
DLCO unc % pred: 66 %
DLCO unc: 13.68 ml/min/mmHg
FEF 25-75 Post: 1.87 L/s
FEF 25-75 Pre: 0.65 L/s
FEF2575-%Change-Post: 188 %
FEF2575-%Pred-Post: 70 %
FEF2575-%Pred-Pre: 24 %
FEV1-%Change-Post: 91 %
FEV1-%Pred-Post: 78 %
FEV1-%Pred-Pre: 41 %
FEV1-Post: 2.13 L
FEV1-Pre: 1.11 L
FEV1FVC-%Change-Post: 89 %
FEV1FVC-%Pred-Pre: 50 %
FEV6-%Change-Post: 9 %
FEV6-%Pred-Post: 83 %
FEV6-%Pred-Pre: 76 %
FEV6-Post: 2.78 L
FEV6-Pre: 2.55 L
FEV6FVC-%Change-Post: 7 %
FEV6FVC-%Pred-Post: 102 %
FEV6FVC-%Pred-Pre: 96 %
FVC-%Change-Post: 1 %
FVC-%Pred-Post: 81 %
FVC-%Pred-Pre: 80 %
FVC-Post: 2.79 L
Post FEV1/FVC ratio: 76 %
Post FEV6/FVC ratio: 100 %
Pre FEV1/FVC ratio: 40 %
Pre FEV6/FVC Ratio: 93 %
RV % pred: 100 %
RV: 1.81 L
TLC % pred: 93 %
TLC: 4.67 L

## 2022-12-16 NOTE — Progress Notes (Signed)
Synopsis: Referred in for shortness of breath by Janet Pounds, PA  Assessment & Plan:   #Cavitary Pneumonia #Pulmonary Embolism #Shortness of Breath #Reactive Airway Disease  She presents today for follow-up with PFTs at show obstructive lung disease with significant response to bronchodilator challenge.  This is consistent with reactive airway disease though it is possible that the patient had poor effort on her initial try.  She tells me today that she found the PFTs to be difficult.  She is maintained on both Symbicort and Advair and is confused about which to use.  Given history of smoking as well as shortness of breath and response to bronchodilator challenge, I will continue her on Symbicort and have asked her to use it twice daily.  Postbronchodilator FEV1/FVC ratio is normal arguing against a COPD by GOLD criteria.  I also reviewed her repeat chest CT which is showing cavitary destruction of the left upper lobe secondary to her severe pneumonia with Legionella and Pseudomonas.  There is also chronic bandlike atelectasis in the left lower lobe.  Both of these findings are secondary to her recent severe pneumonia and no further follow-up is recommended at this point.  Finally, given history of pulmonary embolism, she has not had repeat echocardiogram (no-show for scheduled visit) and we will attempt to reschedule this.  I would recommend at least 12 months of therapy with apixaban (up until February 2025).  -Echocardiogram, complete  Return in about 3 months (around 03/18/2023).  I spent 30 minutes caring for this patient today, including preparing to see the patient, obtaining a medical history , reviewing a separately obtained history, performing a medically appropriate examination and/or evaluation, counseling and educating the patient/family/caregiver, ordering medications, tests, or procedures, documenting clinical information in the electronic health record, and independently  interpreting results (not separately reported/billed) and communicating results to the patient/family/caregiver  Raechel Chute, MD [facility] 12/16/2022 2:23 PM    End of visit medications:  No orders of the defined types were placed in this encounter.    Current Outpatient Medications:    albuterol (VENTOLIN HFA) 108 (90 Base) MCG/ACT inhaler, Inhale 2 puffs into the lungs every 6 (six) hours as needed., Disp: , Rfl:    apixaban (ELIQUIS) 5 MG TABS tablet, Take 1 tablet (5 mg total) by mouth 2 (two) times daily., Disp: 60 tablet, Rfl: 11   clonazePAM (KLONOPIN) 0.5 MG tablet, Take 1 tablet (0.5 mg total) by mouth 3 (three) times daily., Disp: 45 tablet, Rfl: 0   fluticasone-salmeterol (ADVAIR) 250-50 MCG/ACT AEPB, Inhale 1 puff into the lungs in the morning and at bedtime., Disp: , Rfl:    ipratropium-albuterol (DUONEB) 0.5-2.5 (3) MG/3ML SOLN, Take 3 mLs by nebulization every 4 (four) hours as needed. (Patient taking differently: Take 3 mLs by nebulization every 4 (four) hours as needed (sob/wheezing).), Disp: 360 mL, Rfl:    lipase/protease/amylase (CREON) 12000-38000 units CPEP capsule, Take 12,000 Units by mouth 3 (three) times daily before meals., Disp: , Rfl:    lubiprostone (AMITIZA) 24 MCG capsule, Take 1 capsule (24 mcg total) by mouth 2 (two) times daily with a meal., Disp: 60 capsule, Rfl: 0   melatonin 3 MG TABS tablet, Take 1 tablet (3 mg total) by mouth at bedtime. (Patient taking differently: Take 3 mg by mouth at bedtime as needed (sleep).), Disp: 30 tablet, Rfl: 0   metoCLOPramide (REGLAN) 5 MG tablet, Take 1 tablet (5 mg total) by mouth 3 (three) times daily., Disp: 90 tablet,  Rfl: 1   ondansetron (ZOFRAN) 4 MG tablet, Take 1 tablet (4 mg total) by mouth daily as needed for nausea or vomiting., Disp: 30 tablet, Rfl: 1   oxyCODONE-acetaminophen (PERCOCET) 10-325 MG tablet, Take 1 tablet by mouth every 6 (six) hours as needed for pain., Disp: , Rfl:     pregabalin (LYRICA) 25 MG capsule, Take 25 mg by mouth 3 (three) times daily as needed., Disp: , Rfl:    Subjective:   PATIENT ID: Janet Hensley GENDER: female DOB: [birth year], MRN: [id number]  Chief Complaint  Patient presents with   Follow-up    Occasional shortness of breath on exertion. Occasional cough. No wheezing.     HPI  [name] is a 52 year old female presenting to clinic for follow-up.  I first met the patient in [place] 2024 following a prolonged hospitalization to [facility] for severe pneumonia.  She was admitted on 12/22/2021 with shortness of breath and cough with severe hypoxic respiratory failure secondary to Legionella pneumonia with superimposed Pseudomonas and stenotrophomonas infection.  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  Bronchoscopy performed on 12/25/2021 showed thick mucopurulent secretions the left tracheobronchial tree.  Respiratory cultures eventually grew Pseudomonas as well as stenotrophomonas while Legionella urine antigen was positive. Blood cultures were also positive for Rothia for which she was treated.  Patient's respiratory status continued to deteriorate with worsening hypoxemia and she was transferred to [facility] for consideration of ECMO.  She eventually underwent tracheostomy tube placement (01/04/2023) and her condition stabilized.  She was discharged to [facility] on 01/08/2022 where she was eventually decannulated. She then represented to the emergency department on 03/22/2022 for shortness of breath and was admitted after being found to have pulmonary embolism.  CT scan of the chest with PE protocol was notable for acute emboli in the right upper and lower lobes.  She was started on DOAC and eventually discharged on 03/26/2022.  During her follow-up visit in [place], she reported shortness of breath and cough.  She was compliant with her apixaban.  PFTs ordered  and performed prior to today's visit as well as follow-up to CT.  She unfortunately did not undergo TTE.  Patient was admitted to [facility] secondary to abdominal pain and diagnosed with pancreatitis in July 2024.   Today, she reports feeling much better.  She has been more active and has gotten out of her wheelchair.  She is starting to pursue patient physical therapy and aqua therapy.  She unfortunately continues to smoke.  She is using both her Advair and her Symbicort.  That said, breathing is overall improved.   Patient has a history of smoking, with around 20 pack years of smoking history.  She unfortunately continues to smoke denies any other inhalational exposures.  Ancillary information including prior medications, full medical/surgical/family/social histories, and PFTs (when available) are listed below and have been reviewed.   Review of Systems  Constitutional:  Negative for chills, fever, malaise/fatigue and weight loss.  Respiratory:  Positive for cough and shortness of breath. Negative for hemoptysis, sputum production and wheezing.   Cardiovascular:  Negative for chest pain and leg swelling.  Skin:  Negative for rash.     Objective:   Vitals:   12/16/22 1352  BP: 104/72  Pulse: 84  Temp: 98.2 F (36.8 C)  TempSrc: Temporal  SpO2: 97%  Weight: 123 lb 12.8 oz (56.2 kg)  Height:  5\' 3"  (1.6 m)   97% on RA  BMI Readings from Last 3 Encounters:  12/16/22 21.93 kg/m  05/27/22 27.28 kg/m  05/13/22 27.28 kg/m   Wt Readings from Last 3 Encounters:  12/16/22 123 lb 12.8 oz (56.2 kg)  05/27/22 154 lb (69.9 kg)  05/13/22 154 lb (69.9 kg)    Physical Exam Constitutional:      Appearance: Normal appearance. She is normal weight.  HENT:     Head: Normocephalic.     Nose: Nose normal.     Mouth/Throat:     Mouth: Mucous membranes are moist.  Cardiovascular:     Rate and Rhythm: Normal rate and regular rhythm.     Pulses: Normal pulses.     Heart sounds:  Normal heart sounds.  Pulmonary:     Effort: Pulmonary effort is normal.     Breath sounds: Normal breath sounds. No wheezing, rhonchi or rales.  Abdominal:     Palpations: Abdomen is soft.  Musculoskeletal:     Right lower leg: No edema.     Left lower leg: No edema.  Skin:    General: Skin is warm.  Neurological:     General: No focal deficit present.     Mental Status: She is alert and oriented to person, place, and time. Mental status is at baseline.       Ancillary Information    Past Medical History:  Diagnosis Date   AKI (acute kidney injury) (HCC)    Aortic atherosclerosis (HCC)    Asthma 01/15/2012   Chronic anemia    COPD (chronic obstructive pulmonary disease) (HCC)    GERD (gastroesophageal reflux disease)    H/O acute pancreatitis    Hepatic steatosis    Narcotic abuse (HCC)    Nephrolithiasis    Pulmonary embolism (HCC) 2024   Thrombocytopenia (HCC)      Family History  Problem Relation Age of Onset   Colon cancer Neg Hx    Colon polyps Neg Hx    Esophageal cancer Neg Hx    Rectal cancer Neg Hx    Stomach cancer Neg Hx      Past Surgical History:  Procedure Laterality Date   BACK SURGERY     BIOPSY  02/13/2022   Procedure: BIOPSY;  Surgeon: Benancio Deeds, MD;  Location: [facility];  Service: Gastroenterology;;   BRAIN SURGERY Right 2020   CESAREAN SECTION     FLEXIBLE SIGMOIDOSCOPY N/A 02/13/2022   Procedure: FLEXIBLE SIGMOIDOSCOPY;  Surgeon: Benancio Deeds, MD;  Location: [facility] ENDOSCOPY;  Service: Gastroenterology;  Laterality: N/A;   LEFT HEART CATH AND CORONARY ANGIOGRAPHY N/A 12/24/2021   Procedure: LEFT HEART CATH AND CORONARY ANGIOGRAPHY;  Surgeon: Yvonne Kendall, MD;  Location: [facility] INVASIVE CV LAB;  Service: Cardiovascular;  Laterality: N/A;   wrist surgery      Social History   Socioeconomic History   Marital status: Married    Spouse name: Not on file   Number of children: Not on file   Years of education: Not  on file   Highest education level: Not on file  Occupational History   Not on file  Tobacco Use   Smoking status: Every Day    Current packs/day: 1.00    Average packs/day: 1 pack/day for 40.9 years (40.9 ttl pk-yrs)    Types: Cigarettes    Start date: 1984   Smokeless tobacco: Never  Vaping Use   Vaping status: Former  Substance and Sexual Activity   Alcohol  use: Not Currently   Drug use: Not Currently   Sexual activity: Not on file  Other Topics Concern   Not on file  Social History Narrative   Not on file   Social Determinants of Health   Financial Resource Strain: Not on file  Food Insecurity: No Food Insecurity (03/23/2022)   Hunger Vital Sign    Worried About Running Out of Food in the Last Year: Never true    Ran Out of Food in the Last Year: Never true  Transportation Needs: No Transportation Needs (03/23/2022)   PRAPARE - Administrator, Civil Service (Medical): No    Lack of Transportation (Non-Medical): No  Physical Activity: Not on file  Stress: Not on file  Social Connections: Not on file  Intimate Partner Violence: Not At Risk (03/23/2022)   Humiliation, Afraid, Rape, and Kick questionnaire    Fear of Current or Ex-Partner: No    Emotionally Abused: No    Physically Abused: No    Sexually Abused: No     No Known Allergies   CBC    Component Value Date/Time   WBC 9.7 04/30/2022 1928   RBC 3.74 (L) 04/30/2022 1928   HGB 11.1 (L) 04/30/2022 1928   HGB 12.4 02/18/2013 0627   HCT 34.7 (L) 04/30/2022 1928   HCT 37.7 02/18/2013 0627   PLT 526 (H) 04/30/2022 1928   PLT 223 02/18/2013 0627   MCV 92.8 04/30/2022 1928   MCV 99 02/18/2013 0627   MCH 29.7 04/30/2022 1928   MCHC 32.0 04/30/2022 1928   RDW 15.9 (H) 04/30/2022 1928   RDW 13.5 02/18/2013 0627   LYMPHSABS 3.0 04/30/2022 1928   LYMPHSABS 2.2 02/18/2013 0627   MONOABS 0.7 04/30/2022 1928   MONOABS 0.5 02/18/2013 0627   EOSABS 0.0 04/30/2022 1928   EOSABS 0.2 02/18/2013 0627    BASOSABS 0.0 04/30/2022 1928   BASOSABS 0.1 02/18/2013 0627    Pulmonary Functions Testing Results:     No data to display          Outpatient Medications Prior to Visit  Medication Sig Dispense Refill   albuterol (VENTOLIN HFA) 108 (90 Base) MCG/ACT inhaler Inhale 2 puffs into the lungs every 6 (six) hours as needed.     apixaban (ELIQUIS) 5 MG TABS tablet Take 1 tablet (5 mg total) by mouth 2 (two) times daily. 60 tablet 11   clonazePAM (KLONOPIN) 0.5 MG tablet Take 1 tablet (0.5 mg total) by mouth 3 (three) times daily. 45 tablet 0   fluticasone-salmeterol (ADVAIR) 250-50 MCG/ACT AEPB Inhale 1 puff into the lungs in the morning and at bedtime.     ipratropium-albuterol (DUONEB) 0.5-2.5 (3) MG/3ML SOLN Take 3 mLs by nebulization every 4 (four) hours as needed. (Patient taking differently: Take 3 mLs by nebulization every 4 (four) hours as needed (sob/wheezing).) 360 mL    lipase/protease/amylase (CREON) 12000-38000 units CPEP capsule Take 12,000 Units by mouth 3 (three) times daily before meals.     lubiprostone (AMITIZA) 24 MCG capsule Take 1 capsule (24 mcg total) by mouth 2 (two) times daily with a meal. 60 capsule 0   melatonin 3 MG TABS tablet Take 1 tablet (3 mg total) by mouth at bedtime. (Patient taking differently: Take 3 mg by mouth at bedtime as needed (sleep).) 30 tablet 0   metoCLOPramide (REGLAN) 5 MG tablet Take 1 tablet (5 mg total) by mouth 3 (three) times daily. 90 tablet 1   ondansetron (ZOFRAN) 4 MG tablet Take  1 tablet (4 mg total) by mouth daily as needed for nausea or vomiting. 30 tablet 1   oxyCODONE-acetaminophen (PERCOCET) 10-325 MG tablet Take 1 tablet by mouth every 6 (six) hours as needed for pain.     pregabalin (LYRICA) 25 MG capsule Take 25 mg by mouth 3 (three) times daily as needed.     acetaminophen (TYLENOL) 160 MG/5ML solution Take 20.3 mLs (650 mg total) by mouth every 6 (six) hours. 120 mL 0   alum & mag hydroxide-simeth (MAALOX PLUS) 400-400-40  MG/5ML suspension Take 5 mLs by mouth 3 (three) times daily before meals. (Patient not taking: Reported on 12/16/2022) 355 mL 0   famotidine (PEPCID) 20 MG tablet Take 1 tablet (20 mg total) by mouth 2 (two) times daily. (Patient not taking: Reported on 12/16/2022) 60 tablet 0   gabapentin (NEURONTIN) 100 MG capsule Take by mouth. (Patient not taking: Reported on 12/16/2022)     midodrine (PROAMATINE) 10 MG tablet Take 1 tablet (10 mg total) by mouth 3 (three) times daily. (Patient not taking: Reported on 05/13/2022) 90 tablet 0   pantoprazole (PROTONIX) 40 MG tablet Take 1 tablet (40 mg total) by mouth 2 (two) times daily. 60 tablet 2   polyethylene glycol powder (GLYCOLAX/MIRALAX) 17 GM/SCOOP powder Take 1 capful with water (17 g) by mouth daily. (Patient not taking: Reported on 05/13/2022) 238 g 0   potassium chloride SA (KLOR-CON M) 20 MEQ tablet Take 2 tablets (40 mEq total) by mouth 2 (two) times daily. (Patient not taking: Reported on 05/07/2022) 120 tablet 0   No facility-administered medications prior to visit.

## 2023-01-30 ENCOUNTER — Ambulatory Visit: Admission: RE | Admit: 2023-01-30 | Payer: 59 | Source: Ambulatory Visit

## 2023-02-12 ENCOUNTER — Ambulatory Visit (INDEPENDENT_AMBULATORY_CARE_PROVIDER_SITE_OTHER): Payer: 59 | Admitting: Nurse Practitioner

## 2023-02-12 ENCOUNTER — Encounter: Payer: Self-pay | Admitting: Nurse Practitioner

## 2023-02-12 ENCOUNTER — Other Ambulatory Visit (INDEPENDENT_AMBULATORY_CARE_PROVIDER_SITE_OTHER): Payer: 59

## 2023-02-12 VITALS — BP 104/72 | HR 80 | Ht 63.0 in | Wt 125.0 lb

## 2023-02-12 DIAGNOSIS — K559 Vascular disorder of intestine, unspecified: Secondary | ICD-10-CM | POA: Diagnosis not present

## 2023-02-12 DIAGNOSIS — K859 Acute pancreatitis without necrosis or infection, unspecified: Secondary | ICD-10-CM | POA: Diagnosis not present

## 2023-02-12 DIAGNOSIS — K8689 Other specified diseases of pancreas: Secondary | ICD-10-CM | POA: Diagnosis not present

## 2023-02-12 DIAGNOSIS — F17210 Nicotine dependence, cigarettes, uncomplicated: Secondary | ICD-10-CM | POA: Diagnosis not present

## 2023-02-12 LAB — LIPASE: Lipase: 6 U/L — ABNORMAL LOW (ref 11.0–59.0)

## 2023-02-12 LAB — COMPREHENSIVE METABOLIC PANEL
ALT: 5 U/L (ref 0–35)
AST: 9 U/L (ref 0–37)
Albumin: 3.8 g/dL (ref 3.5–5.2)
Alkaline Phosphatase: 86 U/L (ref 39–117)
BUN: 13 mg/dL (ref 6–23)
CO2: 29 meq/L (ref 19–32)
Calcium: 9.3 mg/dL (ref 8.4–10.5)
Chloride: 104 meq/L (ref 96–112)
Creatinine, Ser: 0.62 mg/dL (ref 0.40–1.20)
GFR: 102.16 mL/min (ref >60.00)
Glucose, Bld: 86 mg/dL (ref 70–99)
Potassium: 4.1 meq/L (ref 3.5–5.1)
Sodium: 141 meq/L (ref 135–145)
Total Bilirubin: 0.3 mg/dL (ref 0.2–1.2)
Total Protein: 6.9 g/dL (ref 6.0–8.3)

## 2023-02-12 LAB — CBC WITH DIFFERENTIAL/PLATELET
Basophils Absolute: 0 10*3/uL (ref 0.0–0.1)
Basophils Relative: 0.3 % (ref 0.0–3.0)
Eosinophils Absolute: 0.2 10*3/uL (ref 0.0–0.7)
Eosinophils Relative: 2.3 % (ref 0.0–5.0)
HCT: 41.7 % (ref 36.0–46.0)
Hemoglobin: 13.7 g/dL (ref 12.0–15.0)
Lymphocytes Relative: 24 % (ref 12.0–46.0)
Lymphs Abs: 1.9 10*3/uL (ref 0.7–4.0)
MCHC: 32.8 g/dL (ref 30.0–36.0)
MCV: 96 fL (ref 78.0–100.0)
Monocytes Absolute: 0.5 10*3/uL (ref 0.1–1.0)
Monocytes Relative: 7 % (ref 3.0–12.0)
Neutro Abs: 5.1 10*3/uL (ref 1.4–7.7)
Neutrophils Relative %: 66.4 % (ref 43.0–77.0)
Platelets: 268 10*3/uL (ref 150.0–400.0)
RBC: 4.35 Mil/uL (ref 3.87–5.11)
RDW: 14 % (ref 11.5–15.5)
WBC: 7.7 10*3/uL (ref 4.0–10.5)

## 2023-02-12 NOTE — Progress Notes (Signed)
02/12/2023 Janet Hensley [id number] January 28, 1970   Chief Complaint: Follow up pancreatitis   History of Present Illness: Janet Hensley is a 53 year old female with a past medical history of chronic neck/back pain, asthma, COPD, poly substance abuse and opioid induced constipation.   Refer to Dr. Lanetta Inch 04/09/2022 office visit note for comprehensive medical history including a summary of an extended hospitalization and rehab November - Jan 2024 with with respiratory failure, severe CAP left lung consolidation, lung aspirate positive for pseudomonas, stenotrophomonas, COPD flare, severe sepsis/ARDS, Rothia Mucilaginosa bacteremia, urine positive for Legionella, AKI, thrombocytopenia and severe PCM and seen by our inpatient GI service secondary to ischemic colitis 02/2022. Subsequently developed an acute PE treated with Eliquis. At the time of her office visit with Dr. Adela Lank 04/09/2022, an EGD was recommended secondary to N/V, early satiety and a decreased appetite. She was prescribed Reglan tid and Zofran bid to tid. An eventual colonoscopy was recommended when she completely recovered from there acute illness and when she could tolerate a bowel prep. An EGD was done 05/27/2022 which was normal, no evidence of H. Pylori or celiac disease.   She presents today for follow up regarding acute pancreatitis with peripancreatic fluid collection. She was admitted to [facility] in [facility] 08/06/2022 with N/V and epigastric pain secondary to acute pancreatitis. Lipase 128. Triglyceride level 126.CTAP 7/10 showed mild peripancreatic stranding with irregular fluid collection measuring upt to 2.5 cm along the anterior pancreatic tail with some internal enhancement/nodularity suggestive of acute pancreatitis. An enlarged mesenteric node adjacent to the duodenum was concerning for possible underlying neoplasm. MRI showed this lesion was more consistent with complex cyst. Her clinical status stabilized  and she was discharged home on 08/08/2022 with plans to repeat an abdominal MRI in 3 months.   Readmitted with N/V and worsening epigastric pain 08/20/2022, CTAP showed irregular fluid collection on the anterior pancreatic tail with some internal enhancement/nodularity. Lipase was normal. She noted losing 64 lbs since 11/2021. Her abdominal pain was difficult to control while in  the hospital. She was discharged home 08/26/2022 on Dilaudid short term and Oxycodone, Creon, Pantoprazole and Sucralfate with recommendations to repeat an abdominal MRI in 3 months.   Currently, she feels fairy well.  Nausea has improved and vomiting episodes are less frequent, 4 to 5 times monthly. No hematemesis. No significant abdominal pain. She remains on Oxcodone 1 tab qid for chronic neck and back pain. She is prescribed Dilaudid 10 tabs per month.  She denies alcohol or drug use. She smokes 1/2 to 3/4 pack of cigarettes daily, trying to cut back.      Latest Ref Rng & Units 02/12/2023   11:52 AM 04/30/2022    7:28 PM 04/09/2022    4:42 PM  CMP  Glucose 70 - 99 mg/dL 86  71  92   BUN 6 - 23 mg/dL 13  8  9    Creatinine 0.40 - 1.20 mg/dL 2.13  0.86  5.78   Sodium 135 - 145 mEq/L 141  140  138   Potassium 3.5 - 5.1 mEq/L 4.1  4.0  3.5   Chloride 96 - 112 mEq/L 104  105  106   CO2 19 - 32 mEq/L 29  23  25    Calcium 8.4 - 10.5 mg/dL 9.3  8.4  8.2   Total Protein 6.0 - 8.3 g/dL 6.9  5.7  6.2   Total Bilirubin 0.2 - 1.2 mg/dL 0.3  0.6  0.3  Alkaline Phos 39 - 117 U/L 86  141  138   AST 0 - 37 U/L 9  33  43   ALT 0 - 35 U/L 5  23  30     Lipase 6 on 02/12/2023      Latest Ref Rng & Units 02/12/2023   11:52 AM 04/30/2022    7:28 PM 04/09/2022    4:42 PM  CBC  WBC 4.0 - 10.5 K/uL 7.7  9.7  13.8   Hemoglobin 12.0 - 15.0 g/dL 86.5  78.4  69.6   Hematocrit 36.0 - 46.0 % 41.7  34.7  40.0   Platelets 150.0 - 400.0 K/uL 268.0  526  364.0     CTAP with contrast 08/06/2022:  LOWER CHEST: Few small cystic lesions identified  within the lung bases. No pleural effusion or pericardial effusion.   LIVER: Normal liver contour. Normal increase in size of right hepatic cyst now measuring up to 1.9 cm. Small volume focal fat.   BILIARY: The gallbladder is normal in appearance. No biliary ductal dilatation.    SPLEEN: Normal in size and contour.   PANCREAS: Fat stranding surrounding the pancreas, with adjacent acute peripancreatic collection (2:31) measuring 2.5 x 1.5 cm anterior to the pancreatic tail.   There is slight internal enhancement within the fluid collection, which remains indeterminate (2:31 and 4:27). 9 mm hypodensity identified in the posterior pancreatic tail (2:34). Borderline pancreatic ductal dilation measuring 3.3 mm in caliber.   ADRENAL GLANDS: Normal appearance of the adrenal glands.   KIDNEYS/URETERS: Symmetric renal enhancement.  No hydronephrosis. Right upper pole simple renal cyst. Multifocal nonobstructing right nephrolithiasis measuring up to 4 mm.   BLADDER: Underdistended, limiting evaluation   REPRODUCTIVE ORGANS: Anteverted uterus. No adnexal mass. Ovaries are symmetric.   GI TRACT: Stomach is unremarkable. Of note, the peripancreatic collection does closely abut the posterior gastric wall with loss of the normal fat plane, however, there is no evidence of abnormal thickening at this site. Duodenum is normal in course and caliber. No evidence of bowel obstruction. Normal appendix (4:40). Colonic diverticulosis.   PERITONEUM, RETROPERITONEUM AND MESENTERY: No free air. Trace pelvic ascites.   LYMPH NODES: Scattered subcentimeter peripancreatic lymph nodes. There is a pathologically enlarged 1.3 cm mesenteric node identified adjacent to the second/third portion of the duodenum (2:57).   VESSELS: Hepatic and portal veins are patent.  Normal caliber aorta. At least moderate scattered atheromatous plaque noted along the course of the vasculature.   BONES and SOFT TISSUES: Mild degenerative  changes of the spine. No aggressive osseous lesions.  No focal soft tissue lesions. Small volume fat-containing umbilical hernia.   IMPRESSION:  Mild peripancreatic stranding with irregular fluid collection along the anterior pancreatic tail with some internal enhancement/nodularity. While findings may suggest acute pancreatitis, underlying neoplasm is not excluded and MRI/MRCP of the abdomen with and without contrast is recommended.   Abnormal pathologically enlarged mesenteric node adjacent to the duodenum, further raises concern for possible underlying neoplasm.   Additional subcentimeter pancreatic hypodensities, may also be further evaluated by MRI.   Additional chronic and incidental findings as above.   Abdominal MRI/MRCP with and without contrast 08/06/2022:  HEPATOBILIARY: 2.0 cm segment 5 cyst (6:14). The gallbladder is normal in appearance.  No biliary ductal dilatation.  PANCREAS: Poorly defined T2 heterogeneous signal adjacent to the anterior pancreatic body/tail measuring 2.7 x 1.5 x 1.4 cm (6:14, 4:11). There is peripheral enhancement without definite internal enhancement. Additional small volume peripancreatic fluid and inflammatory change is  greatest surrounding the pancreatic body. Inflammatory changes extend to the left anterior pararenal space. Normal caliber pancreatic duct.  SPLEEN: Unremarkable.  ADRENAL GLANDS: Unremarkable.  KIDNEYS/URETERS: 1.6 cm right upper pole cyst. No enhancing renal lesion. No hydronephrosis.  BOWEL/PERITONEUM/RETROPERITONEUM: No bowel obstruction. No acute inflammatory process No ascites. 2.0 x 1.4 cm T1 isointense, T2 hypointense oval mass without associated restricted diffusion or enhancement adjacent to the second portion of the duodenum (6:25, 23:60).  VASCULATURE: Abdominal aorta within normal limits. Unremarkable inferior vena cava.  LYMPH NODES: Subcentimeter peripancreatic nodes.   BONES/SOFT TISSUES: Unremarkable.   IMPRESSION:   Peripancreatic fluid and inflammatory change with heterogeneous T2 signal without enhancement adjacent to the anterior pancreatic body/tail which is favored to reflect inflammatory change/developing collection in the setting of acute pancreatitis. No definite underlying lesion. Follow-up MRI is recommended in 3 months to further assess.   2.0 cm oval mass adjacent to the second portion of the duodenum without enhancement or restricted diffusion. Signal characteristics favor complex cyst such as a hemorrhagic cyst with lymph node felt less likely due to its lack of enhancement. Follow-up imaging in 3 months could further assess this lesion as well as the pancreas.   CTAP with IV contrast 08/20/2022:  LOWER CHEST: Few small gas-filled cystic lesions identified within the lung bases. No pleural effusion or pericardial effusion.   LIVER: Normal liver contour. Stable right hepatic cyst now measuring up to 1.9 cm (2:32). Small volume focal fat.   BILIARY: The gallbladder is normal in appearance. No biliary ductal dilatation.    SPLEEN: Normal in size and contour.   PANCREAS: Fat stranding surrounding the pancreas with adjacent acute peripancreatic collection (2:28) measuring 2.2 cm anterior to the pancreatic tail that abuts the lesser curvature of the stomach.   There is slight internal increased attenuation within the fluid collection, which remains indeterminate (2:27). 8 mm hypodense lesion identified in the posterior pancreatic tail (2:32). Borderline ductal dilation in the pancreatic tail  measuring 3 mm in caliber, similar.   ADRENAL GLANDS: Normal appearance of the adrenal glands.   KIDNEYS/URETERS: Symmetric renal enhancement.  No hydronephrosis. Right upper pole simple renal cyst. Multifocal nonobstructing right nephrolithiasis measuring up to 4 mm.   BLADDER: Underdistended, limiting evaluation   REPRODUCTIVE ORGANS: Anteverted uterus. No adnexal mass. Ovaries are symmetric.   GI TRACT:  Stomach is unremarkable. Of note, the peripancreatic collection does closely abut the posterior gastric wall with loss of the normal fat plane. However, there is no evidence of abnormal thickening at this site. Duodenum is normal in course and caliber. No evidence of bowel obstruction. Normal appendix (4:40). Colonic diverticulosis.   PERITONEUM, RETROPERITONEUM AND MESENTERY: No free air. Trace pelvic ascites.   LYMPH NODES: Scattered subcentimeter peripancreatic lymph nodes. There is a pathologically enlarged 1.5 cm ovoid 59 HU lesion identified adjacent to the second/third portion of the duodenum (2:56).   VESSELS: Hepatic and portal veins are patent.  Normal caliber aorta. At least moderate scattered atheromatous plaque noted along the course of the vasculature.   BONES and SOFT TISSUES: Mild degenerative changes of the spine. No aggressive osseous lesions.  No focal soft tissue lesions. Small volume fat-containing parumbilical hernia.   IMPRESSION:  Mild peripancreatic stranding with irregular fluid collection along the anterior pancreatic tail with some internal enhancement/nodularity. While findings may suggest acute pancreatitis, underlying neoplasm is not excluded.  Abnormal pathologically enlarged intermediate attenuation lesion abutting the duodenum, similar.   PAST GI PROCEDURES:  EGD 05/27/2022:  - Esophagogastric landmarks  identified.  - Normal esophagus otherwise.  - Normal stomach. Biopsied to rule out H pylori.  - Normal examined duodenum. Biopsied to rule out celiac disease. No overt cause for nausea and vomiting on this exam. Would minimize narcotic use, evaluate for other more rare causes if biopsies unremarkable (adrenal insufficiency, etc) 1. Surgical [P], duodenal DUODENAL MUCOSA WITH NORMAL VILLOUS ARCHITECTURE. NO VILLOUS ATROPHY OR INCREASED INTRAEPITHELIAL LYMPHOCYTES. 2. Surgical [P], gastric antrum and gastric body ANTRAL AND OXYNTIC MUCOSA WITH MILD CHRONIC  INFLAMMATION. IMMUNOHISTOCHEMISTRY FOR HELICOBACTER PYLORI IS NEGATIVE.  Flex sigmoidoscopy 02/13/2022: showed some mild inflammatory changes in the left colon yesterday, not classic for ischemic colitis.   A. COLON, SIGMOID, BIOPSY:  - COLONIC MUCOSA WITH FOCAL ISCHEMIC CHANGES  - SEE COMMENT  COMMENT:  Sections of the biopsy show fragments of colonic mucosa with focal  hyalinized, fibrotic lamina propria and slightly withered crypts.  No  significant acute inflammation is noted.  Clinical correlation  recommended.    Current Outpatient Medications on File Prior to Visit  Medication Sig Dispense Refill   albuterol (VENTOLIN HFA) 108 (90 Base) MCG/ACT inhaler Inhale 2 puffs into the lungs every 6 (six) hours as needed.     apixaban (ELIQUIS) 5 MG TABS tablet Take 1 tablet (5 mg total) by mouth 2 (two) times daily. 60 tablet 11   clonazePAM (KLONOPIN) 0.5 MG tablet Take 1 tablet (0.5 mg total) by mouth 3 (three) times daily. 45 tablet 0   fluticasone-salmeterol (ADVAIR) 250-50 MCG/ACT AEPB Inhale 1 puff into the lungs in the morning and at bedtime.     ipratropium-albuterol (DUONEB) 0.5-2.5 (3) MG/3ML SOLN Take 3 mLs by nebulization every 4 (four) hours as needed. (Patient taking differently: Take 3 mLs by nebulization every 4 (four) hours as needed (sob/wheezing).) 360 mL    lipase/protease/amylase (CREON) 12000-38000 units CPEP capsule Take 12,000 Units by mouth 3 (three) times daily before meals.     lubiprostone (AMITIZA) 24 MCG capsule Take 1 capsule (24 mcg total) by mouth 2 (two) times daily with a meal. 60 capsule 0   melatonin 3 MG TABS tablet Take 1 tablet (3 mg total) by mouth at bedtime. (Patient taking differently: Take 3 mg by mouth at bedtime as needed (sleep).) 30 tablet 0   metoCLOPramide (REGLAN) 5 MG tablet Take 1 tablet (5 mg total) by mouth 3 (three) times daily. 90 tablet 1   ondansetron (ZOFRAN) 4 MG tablet Take 1 tablet (4 mg total) by mouth daily as needed for nausea  or vomiting. 30 tablet 1   oxyCODONE-acetaminophen (PERCOCET) 10-325 MG tablet Take 1 tablet by mouth every 6 (six) hours as needed for pain.     pregabalin (LYRICA) 25 MG capsule Take 25 mg by mouth 3 (three) times daily as needed.     No current facility-administered medications on file prior to visit.   No Known Allergies  Current Medications, Allergies, Past Medical History, Past Surgical History, Family History and Social History were reviewed in [employer] record.  Review of Systems:   Constitutional: Negative for fever, sweats, chills or weight loss.  Respiratory: Negative for shortness of breath.   Cardiovascular: Negative for chest pain, palpitations and leg swelling.  Gastrointestinal: See HPI.  Musculoskeletal: Negative for back pain or muscle aches.  Neurological: Negative for dizziness, headaches or paresthesias.   Physical Exam: Ht 5\' 3"  (1.6 m)   Wt 125 lb (56.7 kg)   LMP 01/16/2016   BMI 22.14 kg/m   Wt Readings  from Last 3 Encounters:  02/12/23 125 lb (56.7 kg)  12/16/22 123 lb 12.8 oz (56.2 kg)  05/27/22 154 lb (69.9 kg)    General: Thin 53 year old female in no acute distress. Head: Normocephalic and atraumatic. Eyes: No scleral icterus. Conjunctiva pink . Ears: Normal auditory acuity. Mouth: Dentition intact. No ulcers or lesions.  Lungs: Clear throughout to auscultation. Heart: Regular rate and rhythm, no murmur. Abdomen: Soft, nontender and nondistended. No masses or hepatomegaly. Normal bowel sounds x 4 quadrants.  Rectal: Deferred.  Musculoskeletal: Symmetrical with no gross deformities. Extremities: No edema. Neurological: Alert oriented x 4. No focal deficits.  Psychological: Alert and cooperative. Normal mood and affect  Assessment and Recommendations:  53 year old female with N/V and upper abdominal pain admitted to [facility] 29 July 2022 diagnosed with acute pancreatitis 07/2022, etiology unclear. CT 7/10 showed  evidence of acute pancreatitis with peripancreatic fluid collection measuring 2.5 x 1.5 cm anterior to the pancreatic tail and a 9mm hypodensity in the pancreatic tail. MRI showed peripancreatic fluid and inflammatory changes to the anterior pancreatic body/tail without underlying lesion and a 2cm oval mass adjacent to the 2nd portion of the duodenum, likely a complex cyst/hemorrhagic cyst. CT 08/20/2022 showed acute pancreatitis with peripancreatic fluid collection measuring 2.2 cm anterior to the pancreatic tail, slight internal increased attenuation within the fluid collection and an 8mm hypodense lesion in the posterior pancreatic tail.  -Abdominal MRI/MRCP with and without contrast  -CBC, CMP and lipase  -Check IgG4 level if pancreatis recurs  -May require future EUS, await MRI/MRCP results  -Patient to contact our office if N/V or abdominal pain recurs  -Continue Creon   PE 02/2022 on Eliquis  Asthm/COPD, chronic tobacco use -Smoking cessation recommended   HFpEF with LV EF 55 to 60%   Ischemic colitis, confirmed per flex sig 01/2022 -Dr. Adela Lank to verify colonoscopy due date   Chronic neck/back pain, chronic opioid use

## 2023-02-12 NOTE — Patient Instructions (Signed)
You have been scheduled for an MRI MRCP at [facility] on 02/19/23. Your appointment time is 11:00 am. Please arrive to admitting (at main entrance of the hospital) 30 minutes prior to your appointment time for registration purposes. Please make certain not to have anything to eat or drink 4 hours prior to your test. In addition, if you have any metal in your body, have a pacemaker or defibrillator, please be sure to let your ordering physician know. This test typically takes 45 minutes to 1 hour to complete. Should you need to reschedule, please call [phone] to do so.  Your provider has requested that you go to the basement level for lab work before leaving today. Press "B" on the elevator. The lab is located at the first door on the left as you exit the elevator.  Due to recent changes in healthcare laws, you may see the results of your imaging and laboratory studies on MyChart before your provider has had a chance to review them.  We understand that in some cases there may be results that are confusing or concerning to you. Not all laboratory results come back in the same time frame and the provider may be waiting for multiple results in order to interpret others.  Please give [name] 48 hours in order for your provider to thoroughly review all the results before contacting the office for clarification of your results.   Thank you for trusting me with your gastrointestinal care!   Alcide Evener, CRNP

## 2023-02-15 ENCOUNTER — Encounter: Payer: Self-pay | Admitting: Nurse Practitioner

## 2023-02-16 NOTE — Progress Notes (Signed)
Agree with assessment and plan as outlined.  MRCP reasonable next step and may need EUS pending result. We will address her acute issues at this time and then discuss over time when she will be able to do colonoscopy.

## 2023-02-19 ENCOUNTER — Ambulatory Visit: Admission: RE | Admit: 2023-02-19 | Payer: 59 | Source: Ambulatory Visit

## 2023-03-02 ENCOUNTER — Other Ambulatory Visit: Payer: Self-pay | Admitting: Nurse Practitioner

## 2023-03-02 ENCOUNTER — Ambulatory Visit
Admission: RE | Admit: 2023-03-02 | Discharge: 2023-03-02 | Disposition: A | Discharge status: Home / Self Care | Payer: 59 | Source: Ambulatory Visit | Attending: Nurse Practitioner | Admitting: Nurse Practitioner

## 2023-03-02 DIAGNOSIS — K859 Acute pancreatitis without necrosis or infection, unspecified: Secondary | ICD-10-CM

## 2023-03-02 DIAGNOSIS — K8689 Other specified diseases of pancreas: Secondary | ICD-10-CM | POA: Diagnosis present

## 2023-03-02 MED ORDER — GADOBUTROL 1 MMOL/ML IV SOLN
5.0000 mL | Freq: Once | INTRAVENOUS | Status: AC | PRN
  Administered 2023-03-02: 5 mL via INTRAVENOUS

## 2023-03-09 ENCOUNTER — Ambulatory Visit
Admission: RE | Admit: 2023-03-09 | Discharge: 2023-03-09 | Disposition: A | Discharge status: Home / Self Care | Payer: 59 | Source: Ambulatory Visit | Attending: Student in an Organized Health Care Education/Training Program | Admitting: Student in an Organized Health Care Education/Training Program

## 2023-03-09 DIAGNOSIS — F17200 Nicotine dependence, unspecified, uncomplicated: Secondary | ICD-10-CM | POA: Insufficient documentation

## 2023-03-09 DIAGNOSIS — I2609 Other pulmonary embolism with acute cor pulmonale: Secondary | ICD-10-CM

## 2023-03-09 DIAGNOSIS — I252 Old myocardial infarction: Secondary | ICD-10-CM | POA: Diagnosis not present

## 2023-03-09 DIAGNOSIS — I2510 Atherosclerotic heart disease of native coronary artery without angina pectoris: Secondary | ICD-10-CM | POA: Insufficient documentation

## 2023-03-09 DIAGNOSIS — Z7901 Long term (current) use of anticoagulants: Secondary | ICD-10-CM | POA: Diagnosis not present

## 2023-03-09 DIAGNOSIS — Z86711 Personal history of pulmonary embolism: Secondary | ICD-10-CM | POA: Insufficient documentation

## 2023-03-09 DIAGNOSIS — R0602 Shortness of breath: Secondary | ICD-10-CM | POA: Insufficient documentation

## 2023-03-09 DIAGNOSIS — I2699 Other pulmonary embolism without acute cor pulmonale: Secondary | ICD-10-CM

## 2023-03-09 DIAGNOSIS — J151 Pneumonia due to Pseudomonas: Secondary | ICD-10-CM | POA: Diagnosis not present

## 2023-03-09 LAB — ECHOCARDIOGRAM COMPLETE
AR max vel: 1.78 cm2
AV Area VTI: 1.87 cm2
AV Area mean vel: 1.79 cm2
AV Mean grad: 3 mm[Hg]
AV Peak grad: 4.8 mm[Hg]
Ao pk vel: 1.1 m/s
Area-P 1/2: 4.57 cm2
S' Lateral: 2.5 cm

## 2023-03-09 NOTE — Progress Notes (Signed)
*  PRELIMINARY RESULTS* Echocardiogram 2D Echocardiogram has been performed.  Bambi Bonine Wyndell Cardiff 03/09/2023, 11:15 AM

## 2023-03-18 ENCOUNTER — Ambulatory Visit: Payer: 59 | Admitting: Student in an Organized Health Care Education/Training Program

## 2023-03-25 ENCOUNTER — Ambulatory Visit: Payer: 59 | Admitting: Student in an Organized Health Care Education/Training Program

## 2023-03-27 ENCOUNTER — Encounter: Payer: Self-pay | Admitting: Nurse Practitioner

## 2023-03-27 ENCOUNTER — Ambulatory Visit (INDEPENDENT_AMBULATORY_CARE_PROVIDER_SITE_OTHER): Payer: 59 | Admitting: Nurse Practitioner

## 2023-03-27 VITALS — BP 98/62 | HR 88 | Ht 63.0 in | Wt 131.0 lb

## 2023-03-27 DIAGNOSIS — J4540 Moderate persistent asthma, uncomplicated: Secondary | ICD-10-CM

## 2023-03-27 DIAGNOSIS — J45909 Unspecified asthma, uncomplicated: Secondary | ICD-10-CM

## 2023-03-27 DIAGNOSIS — J432 Centrilobular emphysema: Secondary | ICD-10-CM

## 2023-03-27 DIAGNOSIS — I2699 Other pulmonary embolism without acute cor pulmonale: Secondary | ICD-10-CM

## 2023-03-27 DIAGNOSIS — J439 Emphysema, unspecified: Secondary | ICD-10-CM | POA: Diagnosis not present

## 2023-03-27 DIAGNOSIS — J151 Pneumonia due to Pseudomonas: Secondary | ICD-10-CM

## 2023-03-27 DIAGNOSIS — J80 Acute respiratory distress syndrome: Secondary | ICD-10-CM

## 2023-03-27 NOTE — Patient Instructions (Addendum)
 Continue Albuterol inhaler 2 puffs or 3 mL neb every 6 hours as needed for shortness of breath or wheezing. Notify if symptoms persist despite rescue inhaler/neb use.  Continue Symbicort 2 puffs Twice daily. Brush tongue and rinse mouth afterwards  Continue to work on quitting smoking!  Glad you're feeling better  Call us if you start to have more trouble with the cough/congestion again Use guaifenesin (mucinex) for cough/congestion  I'll talk to Dr. Aundria Rud about stopping your Eliquis and let you know  Follow up in 4 months with Dr. Aundria Rud or Philis Nettle. If symptoms do not improve or worsen, please contact office for sooner follow up or seek emergency care.

## 2023-03-27 NOTE — Progress Notes (Signed)
 @Patient  ID: Janet Hensley, female    DOB: January 18, 1971, 53 y.o.   MRN: [id number]  Chief Complaint  Patient presents with   Shortness of Breath   Results    Echo 03/09/23    Referring provider: Marylynn Pearson, MD  HPI: 53 year old female, active smoker followed for emphysema, reactive airway disease, history of cavitary pneumonia.  She is a patient of Dr. Aundria Rud and last seen in office 12/26/2022. Past medical history significant for provoked PE on Eliquis, STEMI, ARDS, chronic pancreatitis, depression, chronic pain, anxiety.  She was admitted 12/22/2021 with shortness of breath cough and severe hypoxic respiratory failure secondary to Legionella pneumonia and superimposed Pseudomonas and stenotrophomonas infection.  She had a cavitary lesion in the left upper lobe.  She underwent bronchoscopy with above results.  Respiratory status continues to deteriorate so she was transferred to [facility] for consideration of ECMO but ended up having tracheostomy placed and condition stabilized.  She was discharged to [facility] on 01/08/2022 where she was eventually decannulated.  She went back to the emergency department on 03/22/2022 for increased shortness of breath and found to have pulmonary emboli and right upper and lower lobes.  She was discharged on DOAC on 03/26/2022.  TEST/EVENTS:  12/22/2021 CTA chest: No PE.  Lymphadenopathy.  Near complete opacification of the left lung.  Few areas of peribronchovascular crowding.  Segmental bronchial narrowing/occlusion of the right upper lobe, lingula and right lower lobe.  Trace pleural effusion.  Paraseptal emphysema in the apices.  Innumerable small pulmonary nodules.  More confluent nodules with subtle adjacent groundglass opacity in the right upper lobe measuring up to 18.5 mm. 03/22/2022 CTA chest: Segmental and larger vessel PE in right upper lobe and segmental PE in right lower lobe.  Scattered consolidation within the left upper lobe and lower  lobes has significantly improved with increase in cavitary component within the left upper lobe.  Mild groundglass opacities in right upper lobe and lower lobes, likely changes from PE. 12/09/2022 CT chest: No adenopathy.  Scattered atherosclerosis.  Large cavitary area within the left upper lobe, unchanged since prior study.  Decreasing solid components. 03/09/2023 echo: EF 60-65%. RV size and function nl. Trivial MR.   12/26/2022: OV with Dr. Aundria Rud. PT shows significant BD. PFT was difficult. Poor effort on initial try. She's using both Symbicort and Advair - confused about which to use. Asked to continue Symbicort and stop Advair. Caviatry destruction in LUL secondary to sever pna with legionella and pseudomonas. No further f/u recommended. Has not had repeat echo since her PE diagnosis. Recommend at least 12 months of therapy with Eliquis.   03/27/2023: Today-follow-up Discussed the use of AI scribe software for clinical note transcription with the patient, who gave verbal consent to proceed.  History of Present Illness   Janet Hensley is a 53 year old female who presents for follow-up. She had echocardiogram that was normal. No evidence of right heart strain or PH following PE.   She recently had COVID and was seen at [facility]. She was not admitted. She was treated with two antibiotics (amoxicillin and doxycycline) and steroids. She has an improving cough which has lightened in color. Breathing back to her baseline. No chest congestion, fevers, chills, hemoptysis. Her chest x-ray that was done and reviewed in Care Everywhere shows chronic changes from previous pneumonia but no new acute process/pneumonia. She uses a nebulizer at home, which she used twice daily during her recent illness, and has been taking Mucinex  DM for chest congestion. She has recently stopped these without any new symptoms. She uses Symbicort twice daily, which she finds helpful, although summer weather exacerbates her symptoms.  This has been her norm since her major hospitalization in 2023.   She has a history of provoked PE/DVT which occurred in February 2024. She has been on Eliquis since then.   She has a history of smoking and is currently on Chantix to aid in quitting, though she finds it challenging due to life stressors. 1 ppd.       No Known Allergies  Immunization History  Administered Date(s) Administered   Influenza, Seasonal, Injecte, Preservative Fre 11/12/2022   Influenza,inj,quad, With Preservative 01/01/2017    Past Medical History:  Diagnosis Date   AKI (acute kidney injury) (HCC)    Aortic atherosclerosis (HCC)    Asthma 01/15/2012   Chronic anemia    COPD (chronic obstructive pulmonary disease) (HCC)    GERD (gastroesophageal reflux disease)    H/O acute pancreatitis    Hepatic steatosis    Narcotic abuse (HCC)    Nephrolithiasis    Pulmonary embolism (HCC) 2024   Thrombocytopenia (HCC)     Tobacco History: Social History   Tobacco Use  Smoking Status Every Day   Current packs/day: 1.00   Average packs/day: 1 pack/day for 41.2 years (41.2 ttl pk-yrs)   Types: Cigarettes   Start date: 1984  Smokeless Tobacco Never   Ready to quit: Not Answered Counseling given: Not Answered   Outpatient Medications Prior to Visit  Medication Sig Dispense Refill   albuterol (VENTOLIN HFA) 108 (90 Base) MCG/ACT inhaler Inhale 2 puffs into the lungs every 6 (six) hours as needed.     apixaban (ELIQUIS) 5 MG TABS tablet Take 1 tablet (5 mg total) by mouth 2 (two) times daily. 60 tablet 11   budesonide-formoterol (SYMBICORT) 160-4.5 MCG/ACT inhaler Inhale 2 puffs into the lungs 2 (two) times daily.     clonazePAM (KLONOPIN) 0.5 MG tablet Take 1 tablet (0.5 mg total) by mouth 3 (three) times daily. 45 tablet 0   HYDROmorphone (DILAUDID) 2 MG tablet Take 2 mg by mouth every 6 (six) hours as needed.     ipratropium-albuterol (DUONEB) 0.5-2.5 (3) MG/3ML SOLN Take 3 mLs by nebulization every 4  (four) hours as needed. (Patient taking differently: Take 3 mLs by nebulization every 4 (four) hours as needed (sob/wheezing).) 360 mL    lipase/protease/amylase (CREON) 12000-38000 units CPEP capsule Take 12,000 Units by mouth 3 (three) times daily before meals.     melatonin 3 MG TABS tablet Take 1 tablet (3 mg total) by mouth at bedtime. (Patient taking differently: Take 3 mg by mouth at bedtime as needed (sleep).) 30 tablet 0   metoCLOPramide (REGLAN) 5 MG tablet Take 1 tablet (5 mg total) by mouth 3 (three) times daily. 90 tablet 1   nortriptyline (PAMELOR) 10 MG capsule Take 10 mg by mouth at bedtime.     oxyCODONE-acetaminophen (PERCOCET) 10-325 MG tablet Take 1 tablet by mouth every 6 (six) hours as needed for pain.     pregabalin (LYRICA) 25 MG capsule Take 25 mg by mouth 3 (three) times daily as needed.     fluticasone-salmeterol (ADVAIR) 250-50 MCG/ACT AEPB Inhale 1 puff into the lungs in the morning and at bedtime.     lubiprostone (AMITIZA) 24 MCG capsule Take 1 capsule (24 mcg total) by mouth 2 (two) times daily with a meal. (Patient not taking: Reported on 03/27/2023) 60 capsule 0  traZODone (DESYREL) 100 MG tablet Take 100 mg by mouth at bedtime. (Patient not taking: Reported on 03/27/2023)     No facility-administered medications prior to visit.     Review of Systems:   Constitutional: No weight loss or gain, night sweats, fevers, chills +baseline fatigue, lassitude. HEENT: No headaches, difficulty swallowing, tooth/dental problems, or sore throat. No sneezing, itching, ear ache, nasal congestion, or post nasal drip CV:  No chest pain, orthopnea, PND, swelling in lower extremities, anasarca, dizziness, palpitations, syncope Resp: +shortness of breath with exertion; daily productive cough. No excess mucus or change in color of mucus. No hemoptysis. No wheezing.  No chest wall deformity GI:  No heartburn, indigestion Neuro: No dizziness or lightheadedness.  Psych: No increased  depression or anxiety. Mood stable.     Physical Exam:  BP 98/62   Pulse 88   Ht 5\' 3"  (1.6 m)   Wt 131 lb (59.4 kg)   LMP 01/16/2016   SpO2 99%   BMI 23.21 kg/m   GEN: Pleasant, interactive, well-appearing; in no acute distress HEENT:  Normocephalic and atraumatic. PERRLA. Sclera white. Nasal turbinates pink, moist and patent bilaterally. No rhinorrhea present. Oropharynx pink and moist, without exudate or edema. No lesions, ulcerations, or postnasal drip.  NECK:  Supple w/ fair ROM. No lymphadenopathy.   CV: RRR, no m/r/g, no peripheral edema. Pulses intact, +2 bilaterally. No cyanosis, pallor or clubbing. PULMONARY:  Unlabored, regular breathing. Diminished bibasilar airflow otherwise clear bilaterally A&P w/o wheezes/rales/rhonchi. No accessory muscle use.  GI: BS present and normoactive. Soft, non-tender to palpation. No organomegaly or masses detected. MSK: No erythema, warmth or tenderness. Cap refil <2 sec all extrem. No deformities or joint swelling noted.  Neuro: A/Ox3. No focal deficits noted.   Skin: Warm, no lesions or rashe Psych: Normal affect and behavior. Judgement and thought content appropriate.     Lab Results:  CBC    Component Value Date/Time   WBC 7.7 02/12/2023 1152   RBC 4.35 02/12/2023 1152   HGB 13.7 02/12/2023 1152   HGB 12.4 02/18/2013 0627   HCT 41.7 02/12/2023 1152   HCT 37.7 02/18/2013 0627   PLT 268.0 02/12/2023 1152   PLT 223 02/18/2013 0627   MCV 96.0 02/12/2023 1152   MCV 99 02/18/2013 0627   MCH 29.7 04/30/2022 1928   MCHC 32.8 02/12/2023 1152   RDW 14.0 02/12/2023 1152   RDW 13.5 02/18/2013 0627   LYMPHSABS 1.9 02/12/2023 1152   LYMPHSABS 2.2 02/18/2013 0627   MONOABS 0.5 02/12/2023 1152   MONOABS 0.5 02/18/2013 0627   EOSABS 0.2 02/12/2023 1152   EOSABS 0.2 02/18/2013 0627   BASOSABS 0.0 02/12/2023 1152   BASOSABS 0.1 02/18/2013 0627    BMET    Component Value Date/Time   NA 141 02/12/2023 1152   NA 138 02/18/2013  0627   K 4.1 02/12/2023 1152   K 4.1 02/18/2013 0627   CL 104 02/12/2023 1152   CL 109 (H) 02/18/2013 0627   CO2 29 02/12/2023 1152   CO2 27 02/18/2013 0627   GLUCOSE 86 02/12/2023 1152   GLUCOSE 102 (H) 02/18/2013 0627   BUN 13 02/12/2023 1152   BUN 5 (L) 02/18/2013 0627   CREATININE 0.62 02/12/2023 1152   CREATININE 0.79 02/18/2013 0627   CALCIUM 9.3 02/12/2023 1152   CALCIUM 8.0 (L) 02/18/2013 0627   GFRNONAA >60 04/30/2022 1928   GFRNONAA >60 02/18/2013 0627   GFRAA >60 02/18/2013 0627    BNP  Component Value Date/Time   BNP 58.0 03/22/2022 1837     Imaging:  MR ABDOMEN MRCP W WO CONTAST Result Date: 03/12/2023 CLINICAL DATA:  Compare to MRI done at [facility] on July 10,2024; ACUTE PANCREATITIS. Chronic neck/back pain. Asthma. COPD. EXAM: MRI ABDOMEN WITHOUT AND WITH CONTRAST (INCLUDING MRCP) TECHNIQUE: Multiplanar multisequence MR imaging of the abdomen was performed both before and after the administration of intravenous contrast. Heavily T2-weighted images of the biliary and pancreatic ducts were obtained, and three-dimensional MRCP images were rendered by post processing. CONTRAST:  5mL GADAVIST GADOBUTROL 1 MMOL/ML IV SOLN COMPARISON:  MRI abdomen from 08/06/2022. FINDINGS: Lower chest: Unremarkable MR appearance to the lung bases. No pleural effusion. No pericardial effusion. Normal heart size. Hepatobiliary: The liver is normal in size and configuration. Redemonstration of a 1.3 x 1.8 cm simple cyst in the right hepatic lobe, segment 5. No intrahepatic or extrahepatic bile duct dilatation. No choledocholithiasis. Unremarkable gallbladder. Pancreas: Redemonstration of an approximately 8 x 13 mm (when measured including the walls), T2 hyperintense, nonenhancing collection anterior to the pancreatic body/tail junction region. The collection appears essentially unchanged since the prior study however, there is interval resolution of surrounding fat stranding. Findings favor  pancreatic pseudocyst. The rest of the pancreas is otherwise unremarkable. No suspicious mass or peripancreatic fat stranding. Main pancreatic duct is nondilated. Spleen:  Within normal limits in size and appearance. No focal mass. Adrenals/Urinary Tract: Unremarkable adrenal glands. Redemonstration of several simple cysts in the right kidney with largest in the upper pole measuring up to 1.2 x 1.6 cm. No suspicious renal mass. No hydroureteronephrosis in either side. Stomach/Bowel: Visualized portions within the abdomen are unremarkable. No disproportionate dilation of bowel loops. Scattered colonic diverticula noted without diverticulitis. Vascular/Lymphatic: No pathologically enlarged lymph nodes identified. No abdominal aortic aneurysm demonstrated. No ascites. Other:  None. Musculoskeletal: No suspicious bone lesions identified. IMPRESSION: 1. 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 There is no interval change in the size when compared to the prior exam however, interval resolution of surrounding fat stranding. 2. Otherwise no suspicious pancreatic lesion seen. Main pancreatic duct is not dilated. No peripancreatic fat stranding. 3. Multiple other nonacute observations (such as simple hepatic cyst, right renal cysts, etc.), as described above. Electronically Signed   By: Jules Schick M.D.   On: 03/12/2023 10:54   MR 3D Recon At Scanner Result Date: 03/12/2023 CLINICAL DATA:  Compare to MRI done at [facility] on July 10,2024; ACUTE PANCREATITIS. Chronic neck/back pain. Asthma. COPD. EXAM: MRI ABDOMEN WITHOUT AND WITH CONTRAST (INCLUDING MRCP) TECHNIQUE: Multiplanar multisequence MR imaging of the abdomen was performed both before and after the administration of intravenous contrast. Heavily T2-weighted images of the biliary and pancreatic ducts were obtained, and  three-dimensional MRCP images were rendered by post processing. CONTRAST:  5mL GADAVIST GADOBUTROL 1 MMOL/ML IV SOLN COMPARISON:  MRI abdomen from 08/06/2022. FINDINGS: Lower chest: Unremarkable MR appearance to the lung bases. No pleural effusion. No pericardial effusion. Normal heart size. Hepatobiliary: The liver is normal in size and configuration. Redemonstration of a 1.3 x 1.8 cm simple cyst in the right hepatic lobe, segment 5. No intrahepatic or extrahepatic bile duct dilatation. No choledocholithiasis. Unremarkable gallbladder. Pancreas: Redemonstration of an approximately 8 x 13 mm (when measured including the walls), T2 hyperintense, nonenhancing collection anterior to the pancreatic body/tail junction region. The collection appears essentially unchanged since the prior study however, there is interval resolution  of surrounding fat stranding. Findings favor pancreatic pseudocyst. The rest of the pancreas is otherwise unremarkable. No suspicious mass or peripancreatic fat stranding. Main pancreatic duct is nondilated. Spleen:  Within normal limits in size and appearance. No focal mass. Adrenals/Urinary Tract: Unremarkable adrenal glands. Redemonstration of several simple cysts in the right kidney with largest in the upper pole measuring up to 1.2 x 1.6 cm. No suspicious renal mass. No hydroureteronephrosis in either side. Stomach/Bowel: Visualized portions within the abdomen are unremarkable. No disproportionate dilation of bowel loops. Scattered colonic diverticula noted without diverticulitis. Vascular/Lymphatic: No pathologically enlarged lymph nodes identified. No abdominal aortic aneurysm demonstrated. No ascites. Other:  None. Musculoskeletal: No suspicious bone lesions identified. IMPRESSION: 1. 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 There is no interval change in  the size when compared to the prior exam however, interval resolution of surrounding fat stranding. 2. Otherwise no suspicious pancreatic lesion seen. Main pancreatic duct is not dilated. No peripancreatic fat stranding. 3. Multiple other nonacute observations (such as simple hepatic cyst, right renal cysts, etc.), as described above. Electronically Signed   By: Jules Schick M.D.   On: 03/12/2023 10:54   ECHOCARDIOGRAM COMPLETE Result Date: 03/09/2023    ECHOCARDIOGRAM REPORT   Patient Name:   LENIYA BREIT Date of Exam: 03/09/2023 Medical Rec #:  [id number]     Height:       63.0 in Accession #:    [id number]    Weight:       125.0 lb Date of Birth:  [birth year]     BSA:          1.584 m Patient Age:    52 years      BP:           128/78 mmHg Patient Gender: F             HR:           110 bpm. Exam Location:  [facility] Procedure: 2D Echo, Cardiac Doppler and Color Doppler Indications:     Pulmonary Embolus I26.09  History:         Patient has prior history of Echocardiogram examinations, most                  recent 03/23/2022. CAD and Previous Myocardial Infarction; Risk                  Factors:Current Smoker. H/O pulmonary embolus; on Eliquis.  Sonographer:     Dondra Prader RVT RCS Referring Phys:  [id number] KHABIB DGAYLI Diagnosing Phys: Cristal Deer End MD IMPRESSIONS  1. Left ventricular ejection fraction, by estimation, is 60 to 65%. The left ventricle has normal function. The left ventricle has no regional wall motion abnormalities. Left ventricular diastolic parameters were normal.  2. Right ventricular systolic function is normal. The right ventricular size is normal. Tricuspid regurgitation signal is inadequate for assessing PA pressure.  3. The mitral valve is normal in structure. Trivial mitral valve regurgitation. No evidence of mitral stenosis.  4. The aortic valve is tricuspid. Aortic valve regurgitation is not visualized. No aortic stenosis is present.  5. The inferior vena cava is normal in size with  <50% respiratory variability, suggesting right atrial pressure of 8 mmHg. FINDINGS  Left Ventricle: Left ventricular ejection fraction, by estimation, is 60 to 65%. The left ventricle has normal function. The left ventricle has no regional wall motion abnormalities.  The left ventricular internal cavity size was normal in size. There is  no left ventricular hypertrophy. Left ventricular diastolic parameters were normal. Right Ventricle: The right ventricular size is normal. No increase in right ventricular wall thickness. Right ventricular systolic function is normal. Tricuspid regurgitation signal is inadequate for assessing PA pressure. Left Atrium: Left atrial size was normal in size. Right Atrium: Right atrial size was normal in size. Pericardium: There is no evidence of pericardial effusion. Mitral Valve: The mitral valve is normal in structure. Trivial mitral valve regurgitation. No evidence of mitral valve stenosis. Tricuspid Valve: The tricuspid valve is normal in structure. Tricuspid valve regurgitation is trivial. Aortic Valve: The aortic valve is tricuspid. Aortic valve regurgitation is not visualized. No aortic stenosis is present. Aortic valve mean gradient measures 3.0 mmHg. Aortic valve peak gradient measures 4.8 mmHg. Aortic valve area, by VTI measures 1.87 cm. Pulmonic Valve: The pulmonic valve was not well visualized. Pulmonic valve regurgitation is not visualized. No evidence of pulmonic stenosis. Aorta: The aortic root and ascending aorta are structurally normal, with no evidence of dilitation. Pulmonary Artery: The pulmonary artery is not well seen. Venous: The inferior vena cava is normal in size with less than 50% respiratory variability, suggesting right atrial pressure of 8 mmHg. IAS/Shunts: No atrial level shunt detected by color flow Doppler.  LEFT VENTRICLE PLAX 2D LVIDd:         3.80 cm   Diastology LVIDs:         2.50 cm   LV e' medial:    9.95 cm/s LV PW:         0.80 cm   LV E/e'  medial:  9.3 LV IVS:        0.80 cm   LV e' lateral:   14.40 cm/s LVOT diam:     1.80 cm   LV E/e' lateral: 6.4 LV SV:         34 LV SV Index:   22 LVOT Area:     2.54 cm  RIGHT VENTRICLE             IVC RV S prime:     15.80 cm/s  IVC diam: 1.70 cm TAPSE (M-mode): 2.4 cm LEFT ATRIUM             Index        RIGHT ATRIUM           Index LA diam:        2.50 cm 1.58 cm/m   RA Area:     12.37 cm LA Vol (A2C):   35.0 ml 22.10 ml/m  RA Volume:   24.36 ml  15.38 ml/m LA Vol (A4C):   36.7 ml 23.18 ml/m LA Biplane Vol: 37.7 ml 23.81 ml/m  AORTIC VALVE                    PULMONIC VALVE AV Area (Vmax):    1.78 cm     PV Vmax:       1.09 m/s AV Area (Vmean):   1.79 cm     PV Peak grad:  4.8 mmHg AV Area (VTI):     1.87 cm AV Vmax:           110.00 cm/s AV Vmean:          73.100 cm/s AV VTI:            0.184 m AV Peak Grad:      4.8 mmHg AV Mean  Grad:      3.0 mmHg LVOT Vmax:         77.00 cm/s LVOT Vmean:        51.400 cm/s LVOT VTI:          0.135 m LVOT/AV VTI ratio: 0.73  AORTA Ao Root diam: 2.80 cm Ao Asc diam:  2.80 cm MITRAL VALVE MV Area (PHT): 4.57 cm     SHUNTS MV Decel Time: 166 msec     Systemic VTI:  0.14 m MV E velocity: 92.80 cm/s   Systemic Diam: 1.80 cm MV A velocity: 103.00 cm/s MV E/A ratio:  0.90 Christopher End MD Electronically signed by Yvonne Kendall MD Signature Date/Time: 03/09/2023/2:19:04 PM    Final     Administration History     None          Latest Ref Rng & Units 12/09/2022    2:49 PM  PFT Results  FVC-Predicted Pre % 80   FVC-Post L 2.79   FVC-Predicted Post % 81   Pre FEV1/FVC % % 40   Post FEV1/FCV % % 76   FEV1-Pre L 1.11   FEV1-Predicted Pre % 41   FEV1-Post L 2.13   DLCO uncorrected ml/min/mmHg 13.68   DLCO UNC% % 66   DLVA Predicted % 75   TLC L 4.67   TLC % Predicted % 93   RV % Predicted % 100     No results found for: "NITRICOXIDE"      Assessment & Plan:      Emphysema/Asthma COPD managed with Symbicort twice daily, effective. Recent  flare secondary to COVID resolved after antibiotics and steroids. Continues to experience baseline dyspnea, especially with exertion and in hot weather. encouraged to work on graded exercises. Current smoker, struggling to quit despite Chantix. Smoking cessation reviewed and encouraged. Action plan in place.  - Continue Symbicort twice daily - Encourage smoking cessation  - Use nebulizer as needed for chest congestion - Mucolytic therapy as needed  Deep Vein Thrombosis (DVT) and Pulmonary Embolism (PE) Provoked DVT/PE in the right lung, diagnosed February 2024. Echocardiogram unremarkable. Currently on Eliquis for a year as recommended. Discussed potential discontinuation of Eliquis. Will discuss with Dr. Aundria Rud.  - Confirm with supervising physician about discontinuing Eliquis   History of cavitary pna/ARDS s/p tracheostomy  Severe infection with critical illness and prolonged recovery complicated by PE. See above. Stable CT recently. Aware of symptoms to monitor/notify of.   Smoker Smoking cessation strongly advised. Continue routine CT surveillance.   Follow-up - Schedule follow-up appointment in four months - Call the clinic if any new or worsening symptoms occur.        Advised if symptoms do not improve or worsen, to please contact office for sooner follow up or seek emergency care.   I spent 35 minutes of dedicated to the care of this patient on the date of this encounter to include pre-visit review of records, face-to-face time with the patient discussing conditions above, post visit ordering of testing, clinical documentation with the electronic health record, making appropriate referrals as documented, and communicating necessary findings to members of the patients care team.  Noemi Chapel, NP 03/27/2023  Pt aware and understands NP's role.

## 2023-03-31 ENCOUNTER — Telehealth: Payer: Self-pay | Admitting: Nurse Practitioner

## 2023-03-31 NOTE — Telephone Encounter (Signed)
 03/31/2023: Spoke with Dr. Aundria Rud. Ok to d/c Eliquis. She has been on therapy for a year. Advised pt she can stop Eliquis. Aware of s/s of recurrent PE/DVT to monitor for. Verbalized understanding. Follow up as scheduled.
